# Patient Record
Sex: Male | Born: 1940 | Race: White | Hispanic: No | Marital: Married | Smoking: Former smoker
Health system: Southern US, Community
[De-identification: ages and names within clinical notes are randomized; demographics above are authoritative.]

## PROBLEM LIST (undated history)

## (undated) DIAGNOSIS — I10 Essential (primary) hypertension: Secondary | ICD-10-CM

## (undated) DIAGNOSIS — Z9289 Personal history of other medical treatment: Secondary | ICD-10-CM

## (undated) DIAGNOSIS — I480 Paroxysmal atrial fibrillation: Secondary | ICD-10-CM

## (undated) DIAGNOSIS — N183 Chronic kidney disease, stage 3 unspecified: Secondary | ICD-10-CM

## (undated) DIAGNOSIS — K219 Gastro-esophageal reflux disease without esophagitis: Secondary | ICD-10-CM

## (undated) DIAGNOSIS — M1990 Unspecified osteoarthritis, unspecified site: Secondary | ICD-10-CM

## (undated) DIAGNOSIS — E039 Hypothyroidism, unspecified: Secondary | ICD-10-CM

## (undated) DIAGNOSIS — I491 Atrial premature depolarization: Secondary | ICD-10-CM

## (undated) DIAGNOSIS — E785 Hyperlipidemia, unspecified: Secondary | ICD-10-CM

## (undated) DIAGNOSIS — I319 Disease of pericardium, unspecified: Secondary | ICD-10-CM

## (undated) DIAGNOSIS — H3530 Unspecified macular degeneration: Secondary | ICD-10-CM

## (undated) DIAGNOSIS — J45909 Unspecified asthma, uncomplicated: Secondary | ICD-10-CM

## (undated) DIAGNOSIS — I341 Nonrheumatic mitral (valve) prolapse: Secondary | ICD-10-CM

## (undated) DIAGNOSIS — I4729 Other ventricular tachycardia: Secondary | ICD-10-CM

## (undated) DIAGNOSIS — N400 Enlarged prostate without lower urinary tract symptoms: Secondary | ICD-10-CM

## (undated) DIAGNOSIS — C801 Malignant (primary) neoplasm, unspecified: Secondary | ICD-10-CM

## (undated) DIAGNOSIS — R001 Bradycardia, unspecified: Secondary | ICD-10-CM

## (undated) DIAGNOSIS — E278 Other specified disorders of adrenal gland: Secondary | ICD-10-CM

## (undated) DIAGNOSIS — I472 Ventricular tachycardia: Secondary | ICD-10-CM

## (undated) HISTORY — PX: APPENDECTOMY: SHX54

## (undated) HISTORY — PX: TONSILLECTOMY: SUR1361

## (undated) HISTORY — DX: Unspecified macular degeneration: H35.30

## (undated) HISTORY — DX: Ventricular tachycardia: I47.2

## (undated) HISTORY — DX: Other specified disorders of adrenal gland: E27.8

## (undated) HISTORY — DX: Bradycardia, unspecified: R00.1

## (undated) HISTORY — PX: EYE SURGERY: SHX253

## (undated) HISTORY — DX: Nonrheumatic mitral (valve) prolapse: I34.1

## (undated) HISTORY — DX: Hyperlipidemia, unspecified: E78.5

## (undated) HISTORY — PX: HERNIA REPAIR: SHX51

## (undated) HISTORY — DX: Paroxysmal atrial fibrillation: I48.0

## (undated) HISTORY — DX: Other ventricular tachycardia: I47.29

## (undated) HISTORY — DX: Atrial premature depolarization: I49.1

## (undated) HISTORY — DX: Personal history of other medical treatment: Z92.89

## (undated) HISTORY — DX: Benign prostatic hyperplasia without lower urinary tract symptoms: N40.0

## (undated) HISTORY — DX: Disease of pericardium, unspecified: I31.9

## (undated) HISTORY — DX: Chronic kidney disease, stage 3 unspecified: N18.30

## (undated) HISTORY — DX: Unspecified asthma, uncomplicated: J45.909

## (undated) HISTORY — DX: Hypothyroidism, unspecified: E03.9

## (undated) HISTORY — DX: Chronic kidney disease, stage 3 (moderate): N18.3

---

## 1999-01-04 ENCOUNTER — Encounter: Payer: Self-pay | Admitting: Cardiovascular Disease

## 1999-01-04 ENCOUNTER — Ambulatory Visit (HOSPITAL_COMMUNITY): Admission: RE | Admit: 1999-01-04 | Discharge: 1999-01-04 | Payer: Self-pay | Admitting: Cardiovascular Disease

## 1999-01-04 HISTORY — PX: CARDIAC CATHETERIZATION: SHX172

## 2000-06-23 ENCOUNTER — Ambulatory Visit (HOSPITAL_COMMUNITY): Admission: RE | Admit: 2000-06-23 | Discharge: 2000-06-23 | Payer: Self-pay | Admitting: Urology

## 2000-06-23 ENCOUNTER — Encounter: Payer: Self-pay | Admitting: Urology

## 2001-03-18 ENCOUNTER — Encounter: Payer: Self-pay | Admitting: *Deleted

## 2001-03-18 ENCOUNTER — Inpatient Hospital Stay (HOSPITAL_COMMUNITY): Admission: EM | Admit: 2001-03-18 | Discharge: 2001-03-19 | Payer: Self-pay | Admitting: *Deleted

## 2001-07-14 ENCOUNTER — Encounter: Payer: Self-pay | Admitting: Internal Medicine

## 2001-07-14 ENCOUNTER — Encounter: Admission: RE | Admit: 2001-07-14 | Discharge: 2001-07-14 | Payer: Self-pay | Admitting: Internal Medicine

## 2001-11-25 ENCOUNTER — Encounter: Payer: Self-pay | Admitting: Urology

## 2001-11-25 ENCOUNTER — Ambulatory Visit (HOSPITAL_COMMUNITY): Admission: RE | Admit: 2001-11-25 | Discharge: 2001-11-25 | Payer: Self-pay | Admitting: Urology

## 2003-04-15 ENCOUNTER — Ambulatory Visit (HOSPITAL_COMMUNITY): Admission: RE | Admit: 2003-04-15 | Discharge: 2003-04-15 | Payer: Self-pay | Admitting: *Deleted

## 2005-12-06 ENCOUNTER — Emergency Department (HOSPITAL_COMMUNITY): Admission: EM | Admit: 2005-12-06 | Discharge: 2005-12-06 | Payer: Self-pay | Admitting: Emergency Medicine

## 2006-03-21 ENCOUNTER — Ambulatory Visit (HOSPITAL_COMMUNITY): Admission: RE | Admit: 2006-03-21 | Discharge: 2006-03-21 | Payer: Self-pay | Admitting: *Deleted

## 2008-11-09 ENCOUNTER — Ambulatory Visit (HOSPITAL_COMMUNITY): Admission: RE | Admit: 2008-11-09 | Discharge: 2008-11-09 | Payer: Self-pay | Admitting: *Deleted

## 2008-12-20 HISTORY — PX: OTHER SURGICAL HISTORY: SHX169

## 2010-10-16 NOTE — Op Note (Signed)
NAME:  Tanner Ho, Tanner Ho NO.:  [id number]   MEDICAL RECORD NO.:  [id number]          PATIENT TYPE:  AMB   LOCATION:  ENDO                         FACILITY:  [facility]   PHYSICIAN:  Georgiana Spinner, M.D.    DATE OF BIRTH:  [birth year]   DATE OF PROCEDURE:  11/09/2008  DATE OF DISCHARGE:                               OPERATIVE REPORT   PROCEDURE:  Colonoscopy.   INDICATIONS FOR PROCEDURE:  Constipation, rectal bleeding.   ANESTHESIA:  Fentanyl 50 mcg, Versed 7.5 mg.   PROCEDURE IN DETAIL:  With the patient mildly sedated in the left  lateral decubitus position the Pentax videoscopic pediatric colonoscope  was inserted in the rectum and passed under direct vision to the cecum,  identified by ileocecal valve and appendiceal orifice both of which were  photographed.  From this point the endoscope was slowly withdrawn taking  circumferential views of colonic mucosa stopping only in the rectum  which appeared normal on direct and showed hemorrhoids on retroflexed  view.  The endoscope was straightened and withdrawn.  The patient's  vital signs, pulse oximeter remained stable.  The patient tolerated the  procedure well without apparent complications.   FINDINGS:  Internal hemorrhoids, otherwise an unremarkable exam.   PLAN:  Have the patient follow up with me as needed.           ______________________________  Georgiana Spinner, M.D.     GMO/MEDQ  D:  11/09/2008  T:  11/09/2008  Job:  [id number]

## 2010-10-18 ENCOUNTER — Ambulatory Visit
Admission: RE | Admit: 2010-10-18 | Discharge: 2010-10-18 | Disposition: A | Discharge status: Home / Self Care | Payer: Medicare Other | Source: Ambulatory Visit | Attending: Internal Medicine | Admitting: Internal Medicine

## 2010-10-18 ENCOUNTER — Other Ambulatory Visit: Payer: Self-pay | Admitting: Internal Medicine

## 2010-10-18 DIAGNOSIS — S0990XA Unspecified injury of head, initial encounter: Secondary | ICD-10-CM

## 2010-10-19 NOTE — Op Note (Signed)
   NAME:  Tanner Ho, Tanner Ho NO.:  [id number]   MEDICAL RECORD NO.:  [id number]                   PATIENT TYPE:  AMB   LOCATION:  ENDO                                 FACILITY:  [facility]   PHYSICIAN:  Georgiana Spinner, M.D.                 DATE OF BIRTH:  01/03/1941   DATE OF PROCEDURE:  DATE OF DISCHARGE:                                 OPERATIVE REPORT   PROCEDURE:  Upper endoscopy.   INDICATIONS FOR PROCEDURE:  Gastroesophageal reflux disease.   ANESTHESIA:  Demerol 50, Versed 5 mg.   DESCRIPTION OF PROCEDURE:  With the patient mildly sedated in the left  lateral decubitus position, the Olympus videoscopic endoscope was inserted  in the mouth and passed under direct vision through the esophagus which  appeared normal although there was maybe some changes of erythema distally.  We entered into the stomach. The fundus, body, antrum, duodenal bulb and  second portion of the duodenum all appeared normal. From this point, the  endoscope was slowly withdrawn taking circumferential views of the entire  duodenal mucosa until the endoscope was then pulled back in the stomach,  placed in retroflexion to view the stomach from below. The endoscope was  then straightened and withdrawn taking circumferential views of the  remaining gastric and esophageal mucosa. The patient's vital signs and pulse  oximeter remained stable. The patient tolerated the procedure well without  apparent complications.   FINDINGS:  Minimal erythema of distal esophagus otherwise an unremarkable  examination.   PLAN:  Proceed to colonoscopy.                                               Georgiana Spinner, M.D.    GMO/MEDQ  D:  04/15/2003  T:  04/15/2003  Job:  [id number]

## 2010-10-19 NOTE — Op Note (Signed)
   NAME:  Tanner Ho, Tanner Ho NO.:  [id number]   MEDICAL RECORD NO.:  [id number]                   PATIENT TYPE:  AMB   LOCATION:  ENDO                                 FACILITY:  [facility]   PHYSICIAN:  Georgiana Spinner, M.D.                 DATE OF BIRTH:  June 28, 1940   DATE OF PROCEDURE:  DATE OF DISCHARGE:                                 OPERATIVE REPORT   PROCEDURE:  Colonoscopy.   INDICATIONS:  Cancer screening.   ANESTHESIA:  Demerol 10 mg, Versed 1.   DESCRIPTION OF PROCEDURE:  With the patient mildly sedated and in the left  lateral decubitus position, a rectal examination was performed which was  unremarkable.  Subsequently the Olympus videoscopic colonoscope was inserted  in the rectum and passed under direct vision to the cecum, identified by  ileocecal valve and appendiceal orifice, both of which were photographed.  From this point,  the colonoscope was slowly withdrawn, taking  circumferential views of the entire colonic mucosa stopping only in the  rectum which appeared normal on direct and showed hemorrhoids on retroflexed  view.  The endoscope was straightened and withdrawn.  The patient's vital  signs and pulse oximetry remained stable.  The patient tolerated the  procedure well without apparent complications.   FINDINGS:  Internal hemorrhoids; otherwise unremarkable colonoscopic  examination of the cecum.   PLAN:  Consider repeat examination possibly in five years.                                               Georgiana Spinner, M.D.    GMO/MEDQ  D:  04/15/2003  T:  04/15/2003  Job:  [id number]

## 2010-10-19 NOTE — Discharge Summary (Signed)
[place]  Patient:    Tanner Ho, Tanner Ho Visit Number: [id number] MRN: [id number]          Service Type: MED Location: 2000 2040 01 Attending Physician:  Darlin Priestly Dictated by:   Marya Fossa, P.A. Admit Date:  03/17/2001 Discharge Date: 03/19/2001   CC:         Rozanna Boer., M.D.             Janae Bridgeman. Eloise Harman., M.D.                           Discharge Summary  DATE OF BIRTH:  Oct 29, 1940  ADMISSION DIAGNOSES: 1. Atypical chest pain, rule out myocardial infarction. 2. History of normal coronaries. 3. Hypertension. 4. Atrophic left kidney. 5. History of bladder cancer followed by Dr. Aldean Ast.  DISCHARGE DIAGNOSES: 1. Pericarditis, myocardial infarction ruled out with negative enzymes. 2. Lesion on right kidney, needs outpatient followup CT. 3. History of normal coronaries. 4. Hypertension. 5. Atrophic left kidney. 6. History of bladder cancer followed by Dr. Aldean Ast.  HISTORY OF PRESENT ILLNESS:  Tanner Ho is a 70 year old married white male patient of Dr. Lenise Herald with PAF and patent coronaries as of catheterization August 03, 1998 with an EF of 70%.  He does have some bridging of muscle in the LAD territory.  The patient had been in his usual state of health until the date of admission. He had developed left anterior chest pain after dinner.  He took an aspirin around 10 oclock last night without relief.  He also had pain in the back but no nausea, vomiting, or diaphoresis.  No shortness of breath.  He did have pain with deep inspiration.  He presented to the emergency room at 11 oclock and a nitroglycerin patch was placed with plus or minus help.  The patient states pain medicine helped more than anything else (he was given Toradol). The pain improved from an 8/10 to a 2/10 with Toradol.  EKG showed sinus rhythm with nonspecific changes.  Chest CT was negative for PE and DVT.  Labs showed a  white cell of 9.8, hemoglobin 15.3, and platelets 235.  INR 0.9.  BUN 28, creatinine 1.4.  Potassium 4.6.  Initial enzymes negative.  The patient will be admitted for atypical chest pain, rule out MI.  We will check an abdominal ultrasound and LFTs.  If stable, we will discharge to home in the morning on proton pump inhibitor and NSAIDs.  PROCEDURES: 1. Abdominal ultrasound. 2. Chest CT. 3. Lower extremity CT.  CONSULTATIONS:  None.  COMPLICATIONS:  None.  HOSPITAL COURSE:  [name] was admitted to [facility] with diagnosis of atypical chest pain that responded to Toradol.  Ischemic etiology felt low risk based on the patients history of normal coronaries, negative EKG, and normal enzymes.  He was treated with Toradol and Tylenol for pain.  Cardiac enzymes were negative.  As mentioned above, chest CT was negative for PE and lower extremity study was negative for DVT.  He also had an abdominal ultrasound, which was negative for acute gallbladder process.  His left kidney was absent and there was a small lesion of the right kidney of an unspecific etiology.  Radiology recommended followup limited CT.  The patient remained stable overnight.  He states that when he would lay on his left side he had increasing pain and if he would lie flat  on his back the pain would improve.  He still has some mild discomfort with deep breathing, but again feels significantly improved.  On March 19, 2001, the patient was hemodynamically stable with blood pressure mildly elevated at 142/74.  On physical exam, however, he had a two-component friction rub consistent with pericarditis.  The patient will be discharged this morning, as he is stable.  We are going to send him to our office directly from the hospital for a 2-D echocardiogram to evaluate further.  We will see him back in one week and we will plan for close follow-up.  DISCHARGE MEDICATIONS: 1. Isordil mono 60 mg a day. 2.  Atenolol 25 mg. 3. Accupril increased to 40 mg a day until seen back by Dr. Jenne Campus. 4. Cartia XT 60 mg a day. 5. Enteric-coated aspirin 325 a day. 6. Metamucil. 7. Prilosec 20 mg a day. 8. Vioxx 12.5 mg a day for two weeks then as needed.  ACTIVITY:  As tolerated; however, no strenuous activity until pericarditis resolves.  DIET:  Low-fat/low-cholesterol/low-salt diet.  DISCHARGE INSTRUCTIONS:  The patient is to go directly to our office upon leaving the hospital for a 10 oclock appointment to have a 2-D echocardiogram performed.  FOLLOW-UP:  He will see Dr. Jenne Campus back on October 23 at 12:25.  We have asked him to contact his primary care Sharnika Binney, Dr. Higinio Plan, to have follow-up.  We would recommend Dr. Lendell Caprice or Dr. Aldean Ast to follow up the lesion noted on the abdominal ultrasound performed in the hospital. Dictated by:   Marya Fossa, P.A. Attending Physician:  Darlin Priestly DD:  03/19/01 TD:  03/20/01 Job: [id number]

## 2011-06-11 DIAGNOSIS — Z7901 Long term (current) use of anticoagulants: Secondary | ICD-10-CM | POA: Diagnosis not present

## 2011-06-11 DIAGNOSIS — I10 Essential (primary) hypertension: Secondary | ICD-10-CM | POA: Diagnosis not present

## 2011-06-11 DIAGNOSIS — I4891 Unspecified atrial fibrillation: Secondary | ICD-10-CM | POA: Diagnosis not present

## 2011-06-25 DIAGNOSIS — H353190 Nonexudative age-related macular degeneration, unspecified eye, stage unspecified: Secondary | ICD-10-CM | POA: Diagnosis not present

## 2011-06-25 DIAGNOSIS — H43829 Vitreomacular adhesion, unspecified eye: Secondary | ICD-10-CM | POA: Diagnosis not present

## 2011-07-04 DIAGNOSIS — C44211 Basal cell carcinoma of skin of unspecified ear and external auricular canal: Secondary | ICD-10-CM | POA: Diagnosis not present

## 2011-07-16 DIAGNOSIS — Z7901 Long term (current) use of anticoagulants: Secondary | ICD-10-CM | POA: Diagnosis not present

## 2011-07-16 DIAGNOSIS — I4891 Unspecified atrial fibrillation: Secondary | ICD-10-CM | POA: Diagnosis not present

## 2011-07-16 DIAGNOSIS — I10 Essential (primary) hypertension: Secondary | ICD-10-CM | POA: Diagnosis not present

## 2011-07-25 DIAGNOSIS — H259 Unspecified age-related cataract: Secondary | ICD-10-CM | POA: Diagnosis not present

## 2011-07-25 DIAGNOSIS — H3530 Unspecified macular degeneration: Secondary | ICD-10-CM | POA: Diagnosis not present

## 2011-08-12 DIAGNOSIS — I10 Essential (primary) hypertension: Secondary | ICD-10-CM | POA: Diagnosis not present

## 2011-08-12 DIAGNOSIS — I4891 Unspecified atrial fibrillation: Secondary | ICD-10-CM | POA: Diagnosis not present

## 2011-08-12 DIAGNOSIS — Z7901 Long term (current) use of anticoagulants: Secondary | ICD-10-CM | POA: Diagnosis not present

## 2011-08-15 DIAGNOSIS — H02109 Unspecified ectropion of unspecified eye, unspecified eyelid: Secondary | ICD-10-CM | POA: Diagnosis not present

## 2011-08-15 DIAGNOSIS — H01009 Unspecified blepharitis unspecified eye, unspecified eyelid: Secondary | ICD-10-CM | POA: Diagnosis not present

## 2011-08-20 DIAGNOSIS — H3530 Unspecified macular degeneration: Secondary | ICD-10-CM | POA: Diagnosis not present

## 2011-08-20 DIAGNOSIS — H268 Other specified cataract: Secondary | ICD-10-CM | POA: Diagnosis not present

## 2011-08-20 DIAGNOSIS — H2510 Age-related nuclear cataract, unspecified eye: Secondary | ICD-10-CM | POA: Diagnosis not present

## 2011-08-20 DIAGNOSIS — H25019 Cortical age-related cataract, unspecified eye: Secondary | ICD-10-CM | POA: Diagnosis not present

## 2011-09-11 DIAGNOSIS — Z7901 Long term (current) use of anticoagulants: Secondary | ICD-10-CM | POA: Diagnosis not present

## 2011-09-11 DIAGNOSIS — E039 Hypothyroidism, unspecified: Secondary | ICD-10-CM | POA: Diagnosis not present

## 2011-09-11 DIAGNOSIS — I10 Essential (primary) hypertension: Secondary | ICD-10-CM | POA: Diagnosis not present

## 2011-09-11 DIAGNOSIS — I4891 Unspecified atrial fibrillation: Secondary | ICD-10-CM | POA: Diagnosis not present

## 2011-09-12 DIAGNOSIS — Z7901 Long term (current) use of anticoagulants: Secondary | ICD-10-CM | POA: Diagnosis not present

## 2011-09-12 DIAGNOSIS — I10 Essential (primary) hypertension: Secondary | ICD-10-CM | POA: Diagnosis not present

## 2011-09-12 DIAGNOSIS — I4891 Unspecified atrial fibrillation: Secondary | ICD-10-CM | POA: Diagnosis not present

## 2011-09-16 DIAGNOSIS — L570 Actinic keratosis: Secondary | ICD-10-CM | POA: Diagnosis not present

## 2011-09-16 DIAGNOSIS — Z85828 Personal history of other malignant neoplasm of skin: Secondary | ICD-10-CM | POA: Diagnosis not present

## 2011-09-26 DIAGNOSIS — Z7901 Long term (current) use of anticoagulants: Secondary | ICD-10-CM | POA: Diagnosis not present

## 2011-09-26 DIAGNOSIS — I4891 Unspecified atrial fibrillation: Secondary | ICD-10-CM | POA: Diagnosis not present

## 2011-09-26 DIAGNOSIS — I10 Essential (primary) hypertension: Secondary | ICD-10-CM | POA: Diagnosis not present

## 2011-10-01 DIAGNOSIS — I10 Essential (primary) hypertension: Secondary | ICD-10-CM | POA: Diagnosis not present

## 2011-10-01 DIAGNOSIS — M545 Low back pain: Secondary | ICD-10-CM | POA: Diagnosis not present

## 2011-10-02 DIAGNOSIS — H3530 Unspecified macular degeneration: Secondary | ICD-10-CM | POA: Diagnosis not present

## 2011-10-03 DIAGNOSIS — M545 Low back pain: Secondary | ICD-10-CM | POA: Diagnosis not present

## 2011-10-03 DIAGNOSIS — M47817 Spondylosis without myelopathy or radiculopathy, lumbosacral region: Secondary | ICD-10-CM | POA: Diagnosis not present

## 2011-10-11 DIAGNOSIS — J019 Acute sinusitis, unspecified: Secondary | ICD-10-CM | POA: Diagnosis not present

## 2011-10-11 DIAGNOSIS — R062 Wheezing: Secondary | ICD-10-CM | POA: Diagnosis not present

## 2011-10-11 DIAGNOSIS — R05 Cough: Secondary | ICD-10-CM | POA: Diagnosis not present

## 2011-10-11 DIAGNOSIS — R5383 Other fatigue: Secondary | ICD-10-CM | POA: Diagnosis not present

## 2011-10-11 DIAGNOSIS — R5381 Other malaise: Secondary | ICD-10-CM | POA: Diagnosis not present

## 2011-10-21 DIAGNOSIS — Z961 Presence of intraocular lens: Secondary | ICD-10-CM | POA: Diagnosis not present

## 2011-10-31 DIAGNOSIS — I4891 Unspecified atrial fibrillation: Secondary | ICD-10-CM | POA: Diagnosis not present

## 2011-10-31 DIAGNOSIS — I10 Essential (primary) hypertension: Secondary | ICD-10-CM | POA: Diagnosis not present

## 2011-10-31 DIAGNOSIS — Z7901 Long term (current) use of anticoagulants: Secondary | ICD-10-CM | POA: Diagnosis not present

## 2011-11-01 DIAGNOSIS — H43819 Vitreous degeneration, unspecified eye: Secondary | ICD-10-CM | POA: Diagnosis not present

## 2011-11-01 DIAGNOSIS — H3560 Retinal hemorrhage, unspecified eye: Secondary | ICD-10-CM | POA: Diagnosis not present

## 2011-11-04 DIAGNOSIS — H353190 Nonexudative age-related macular degeneration, unspecified eye, stage unspecified: Secondary | ICD-10-CM | POA: Diagnosis not present

## 2011-11-04 DIAGNOSIS — H43829 Vitreomacular adhesion, unspecified eye: Secondary | ICD-10-CM | POA: Diagnosis not present

## 2011-11-04 DIAGNOSIS — H4310 Vitreous hemorrhage, unspecified eye: Secondary | ICD-10-CM | POA: Diagnosis not present

## 2011-11-04 DIAGNOSIS — H33319 Horseshoe tear of retina without detachment, unspecified eye: Secondary | ICD-10-CM | POA: Diagnosis not present

## 2011-11-04 DIAGNOSIS — H35369 Drusen (degenerative) of macula, unspecified eye: Secondary | ICD-10-CM | POA: Diagnosis not present

## 2011-11-04 DIAGNOSIS — H43819 Vitreous degeneration, unspecified eye: Secondary | ICD-10-CM | POA: Diagnosis not present

## 2011-11-26 DIAGNOSIS — I10 Essential (primary) hypertension: Secondary | ICD-10-CM | POA: Diagnosis not present

## 2011-11-26 DIAGNOSIS — Z7901 Long term (current) use of anticoagulants: Secondary | ICD-10-CM | POA: Diagnosis not present

## 2011-11-26 DIAGNOSIS — R42 Dizziness and giddiness: Secondary | ICD-10-CM | POA: Diagnosis not present

## 2011-11-26 DIAGNOSIS — I4891 Unspecified atrial fibrillation: Secondary | ICD-10-CM | POA: Diagnosis not present

## 2011-12-16 DIAGNOSIS — Z0000 Encounter for general adult medical examination without abnormal findings: Secondary | ICD-10-CM | POA: Diagnosis not present

## 2011-12-16 DIAGNOSIS — I10 Essential (primary) hypertension: Secondary | ICD-10-CM | POA: Diagnosis not present

## 2011-12-16 DIAGNOSIS — Z125 Encounter for screening for malignant neoplasm of prostate: Secondary | ICD-10-CM | POA: Diagnosis not present

## 2011-12-16 DIAGNOSIS — E7800 Pure hypercholesterolemia, unspecified: Secondary | ICD-10-CM | POA: Diagnosis not present

## 2011-12-19 DIAGNOSIS — R5381 Other malaise: Secondary | ICD-10-CM | POA: Diagnosis not present

## 2011-12-19 DIAGNOSIS — Z0000 Encounter for general adult medical examination without abnormal findings: Secondary | ICD-10-CM | POA: Diagnosis not present

## 2011-12-19 DIAGNOSIS — I10 Essential (primary) hypertension: Secondary | ICD-10-CM | POA: Diagnosis not present

## 2011-12-19 DIAGNOSIS — Z7901 Long term (current) use of anticoagulants: Secondary | ICD-10-CM | POA: Diagnosis not present

## 2011-12-19 DIAGNOSIS — I4891 Unspecified atrial fibrillation: Secondary | ICD-10-CM | POA: Diagnosis not present

## 2011-12-26 DIAGNOSIS — I10 Essential (primary) hypertension: Secondary | ICD-10-CM | POA: Diagnosis not present

## 2011-12-26 DIAGNOSIS — Z7901 Long term (current) use of anticoagulants: Secondary | ICD-10-CM | POA: Diagnosis not present

## 2011-12-26 DIAGNOSIS — I4891 Unspecified atrial fibrillation: Secondary | ICD-10-CM | POA: Diagnosis not present

## 2012-01-09 DIAGNOSIS — I4891 Unspecified atrial fibrillation: Secondary | ICD-10-CM | POA: Diagnosis not present

## 2012-01-09 DIAGNOSIS — I10 Essential (primary) hypertension: Secondary | ICD-10-CM | POA: Diagnosis not present

## 2012-01-09 DIAGNOSIS — Z7901 Long term (current) use of anticoagulants: Secondary | ICD-10-CM | POA: Diagnosis not present

## 2012-01-14 DIAGNOSIS — I10 Essential (primary) hypertension: Secondary | ICD-10-CM | POA: Diagnosis not present

## 2012-01-16 DIAGNOSIS — I959 Hypotension, unspecified: Secondary | ICD-10-CM | POA: Diagnosis not present

## 2012-01-16 DIAGNOSIS — N289 Disorder of kidney and ureter, unspecified: Secondary | ICD-10-CM | POA: Diagnosis not present

## 2012-01-16 DIAGNOSIS — I10 Essential (primary) hypertension: Secondary | ICD-10-CM | POA: Diagnosis not present

## 2012-02-10 DIAGNOSIS — I4891 Unspecified atrial fibrillation: Secondary | ICD-10-CM | POA: Diagnosis not present

## 2012-02-10 DIAGNOSIS — Z7901 Long term (current) use of anticoagulants: Secondary | ICD-10-CM | POA: Diagnosis not present

## 2012-02-10 DIAGNOSIS — I10 Essential (primary) hypertension: Secondary | ICD-10-CM | POA: Diagnosis not present

## 2012-02-10 DIAGNOSIS — N289 Disorder of kidney and ureter, unspecified: Secondary | ICD-10-CM | POA: Diagnosis not present

## 2012-02-13 DIAGNOSIS — I10 Essential (primary) hypertension: Secondary | ICD-10-CM | POA: Diagnosis not present

## 2012-02-13 DIAGNOSIS — N189 Chronic kidney disease, unspecified: Secondary | ICD-10-CM | POA: Diagnosis not present

## 2012-02-13 DIAGNOSIS — Z23 Encounter for immunization: Secondary | ICD-10-CM | POA: Diagnosis not present

## 2012-02-13 DIAGNOSIS — E7800 Pure hypercholesterolemia, unspecified: Secondary | ICD-10-CM | POA: Diagnosis not present

## 2012-02-24 DIAGNOSIS — Z7901 Long term (current) use of anticoagulants: Secondary | ICD-10-CM | POA: Diagnosis not present

## 2012-02-24 DIAGNOSIS — I4891 Unspecified atrial fibrillation: Secondary | ICD-10-CM | POA: Diagnosis not present

## 2012-02-24 DIAGNOSIS — I10 Essential (primary) hypertension: Secondary | ICD-10-CM | POA: Diagnosis not present

## 2012-03-03 DIAGNOSIS — Z9289 Personal history of other medical treatment: Secondary | ICD-10-CM

## 2012-03-03 DIAGNOSIS — E039 Hypothyroidism, unspecified: Secondary | ICD-10-CM | POA: Diagnosis not present

## 2012-03-03 DIAGNOSIS — E782 Mixed hyperlipidemia: Secondary | ICD-10-CM | POA: Diagnosis not present

## 2012-03-03 DIAGNOSIS — I4891 Unspecified atrial fibrillation: Secondary | ICD-10-CM | POA: Diagnosis not present

## 2012-03-03 DIAGNOSIS — I119 Hypertensive heart disease without heart failure: Secondary | ICD-10-CM | POA: Diagnosis not present

## 2012-03-03 HISTORY — DX: Personal history of other medical treatment: Z92.89

## 2012-03-12 DIAGNOSIS — L821 Other seborrheic keratosis: Secondary | ICD-10-CM | POA: Diagnosis not present

## 2012-03-12 DIAGNOSIS — D485 Neoplasm of uncertain behavior of skin: Secondary | ICD-10-CM | POA: Diagnosis not present

## 2012-03-12 DIAGNOSIS — L570 Actinic keratosis: Secondary | ICD-10-CM | POA: Diagnosis not present

## 2012-03-12 DIAGNOSIS — C44319 Basal cell carcinoma of skin of other parts of face: Secondary | ICD-10-CM | POA: Diagnosis not present

## 2012-03-13 ENCOUNTER — Emergency Department (HOSPITAL_COMMUNITY)
Admission: EM | Admit: 2012-03-13 | Discharge: 2012-03-13 | Disposition: A | Discharge status: Home / Self Care | Payer: Medicare Other | Attending: Emergency Medicine | Admitting: Emergency Medicine

## 2012-03-13 ENCOUNTER — Emergency Department (HOSPITAL_COMMUNITY): Payer: Medicare Other

## 2012-03-13 ENCOUNTER — Encounter (HOSPITAL_COMMUNITY): Payer: Self-pay | Admitting: *Deleted

## 2012-03-13 DIAGNOSIS — Z7901 Long term (current) use of anticoagulants: Secondary | ICD-10-CM | POA: Diagnosis not present

## 2012-03-13 DIAGNOSIS — N183 Chronic kidney disease, stage 3 unspecified: Secondary | ICD-10-CM | POA: Diagnosis present

## 2012-03-13 DIAGNOSIS — IMO0001 Reserved for inherently not codable concepts without codable children: Secondary | ICD-10-CM

## 2012-03-13 DIAGNOSIS — E785 Hyperlipidemia, unspecified: Secondary | ICD-10-CM | POA: Diagnosis present

## 2012-03-13 DIAGNOSIS — R002 Palpitations: Secondary | ICD-10-CM | POA: Diagnosis not present

## 2012-03-13 DIAGNOSIS — I480 Paroxysmal atrial fibrillation: Secondary | ICD-10-CM | POA: Diagnosis present

## 2012-03-13 DIAGNOSIS — I4891 Unspecified atrial fibrillation: Secondary | ICD-10-CM | POA: Insufficient documentation

## 2012-03-13 DIAGNOSIS — R000 Tachycardia, unspecified: Secondary | ICD-10-CM | POA: Diagnosis not present

## 2012-03-13 DIAGNOSIS — C679 Malignant neoplasm of bladder, unspecified: Secondary | ICD-10-CM | POA: Diagnosis present

## 2012-03-13 DIAGNOSIS — I10 Essential (primary) hypertension: Secondary | ICD-10-CM | POA: Diagnosis present

## 2012-03-13 DIAGNOSIS — E039 Hypothyroidism, unspecified: Secondary | ICD-10-CM | POA: Diagnosis present

## 2012-03-13 HISTORY — DX: Essential (primary) hypertension: I10

## 2012-03-13 LAB — CBC
MCH: 35.5 pg — ABNORMAL HIGH (ref 26.0–34.0)
MCV: 99.3 fL (ref 78.0–100.0)
Platelets: 126 10*3/uL — ABNORMAL LOW (ref 150–400)
RDW: 12.7 % (ref 11.5–15.5)

## 2012-03-13 LAB — BASIC METABOLIC PANEL WITH GFR
BUN: 19 mg/dL (ref 6–23)
CO2: 23 meq/L (ref 19–32)
Calcium: 9.3 mg/dL (ref 8.4–10.5)
Chloride: 108 meq/L (ref 96–112)
Creatinine, Ser: 1.29 mg/dL (ref 0.50–1.35)
GFR calc Af Amer: 63 mL/min — ABNORMAL LOW
GFR calc non Af Amer: 54 mL/min — ABNORMAL LOW
Glucose, Bld: 111 mg/dL — ABNORMAL HIGH (ref 70–99)
Potassium: 4.4 meq/L (ref 3.5–5.1)
Sodium: 142 meq/L (ref 135–145)

## 2012-03-13 LAB — PROTIME-INR
INR: 2.13 — ABNORMAL HIGH (ref 0.00–1.49)
Prothrombin Time: 22.9 s — ABNORMAL HIGH (ref 11.6–15.2)

## 2012-03-13 LAB — POCT I-STAT TROPONIN I: Troponin i, poc: 0.05 ng/mL (ref 0.00–0.08)

## 2012-03-13 LAB — TSH: TSH: 3.284 u[IU]/mL (ref 0.350–4.500)

## 2012-03-13 LAB — MAGNESIUM: Magnesium: 2 mg/dL (ref 1.5–2.5)

## 2012-03-13 MED ORDER — DILTIAZEM HCL 25 MG/5ML IV SOLN
10.0000 mg | Freq: Once | INTRAVENOUS | Status: AC
  Administered 2012-03-13: 10 mg via INTRAVENOUS
  Filled 2012-03-13: qty 5

## 2012-03-13 MED ORDER — SODIUM CHLORIDE 0.9 % IV BOLUS (SEPSIS)
1000.0000 mL | Freq: Once | INTRAVENOUS | Status: AC
  Administered 2012-03-13: 1000 mL via INTRAVENOUS

## 2012-03-13 NOTE — H&P (Signed)
Patient ID: EVON TALARICO MRN: [id number], DOB/AGE: 1941-05-06   Admit date: 03/13/2012   Primary Physician: Dr Renne Crigler Primary Cardiologist: Dr Tresa Endo  HPI: 71 y/o male with a history of Nl Cors in March 2000. He also has a history of PAF. He was put on low dose Amiodarone and Coumadin several years ago. He has maintained NSR since till 1am today when he woke up with tachycardia.  He denies any recent illness, no fever, no over the counter medications. In the ER his rate came down with Diltiazem 10mg  IV. His INR is followed at [facility].   Problem List: Past Medical History  Diagnosis Date  . Atrial fibrillation   . Hypertension     No past surgical history on file.   Allergies: No Known Allergies   Home Medications  See Med Rec   No family history on file.   History   Social History  . Marital Status: Married    Spouse Name: N/A    Number of Children: N/A  . Years of Education: N/A   Occupational History  . Not on file.   Social History Main Topics  . Smoking status: Never Smoker   . Smokeless tobacco: Not on file  . Alcohol Use: No  . Drug Use:   . Sexually Active:    Other Topics Concern  . Not on file   Social History Narrative  . No narrative on file     Review of Systems: General: negative for chills, fever, night sweats or weight changes.  Cardiovascular: negative for chest pain, dyspnea on exertion, edema, orthopnea, paroxysmal nocturnal dyspnea or shortness of breath Dermatological: negative for rash Respiratory: negative for cough or wheezing Urologic: negative for hematuria Abdominal: negative for nausea, vomiting, diarrhea, bright red blood per rectum, melena, or hematemesis Neurologic: negative for visual changes, syncope, or dizziness All other systems reviewed and are otherwise negative except as noted above.  Physical Exam: Blood pressure 118/68, pulse 79, temperature 98 F (36.7 C), resp. rate 11, SpO2 95.00%.  General appearance:  alert, cooperative and no distress Neck: no carotid bruit, no JVD and thyroid not enlarged, symmetric, no tenderness/mass/nodules Lungs: clear to auscultation bilaterally Heart: irregularly irregular rhythm Abdomen: soft, non-tender; bowel sounds normal; no masses,  no organomegaly Extremities: extremities normal, atraumatic, no cyanosis or edema Pulses: 2+ and symmetric Skin: Skin color, texture, turgor normal. No rashes or lesions Neurologic: Grossly normal    Labs:   Results for orders placed during the hospital encounter of 03/13/12 (from the past 24 hour(s))  CBC     Status: Abnormal   Collection Time   03/13/12  2:13 AM      Component Value Range   WBC 4.8  4.0 - 10.5 K/uL   RBC 4.31  4.22 - 5.81 MIL/uL   Hemoglobin 15.3  13.0 - 17.0 g/dL   HCT 11.9  14.7 - 82.9 %   MCV 99.3  78.0 - 100.0 fL   MCH 35.5 (*) 26.0 - 34.0 pg   MCHC 35.7  30.0 - 36.0 g/dL   RDW 56.2  13.0 - 86.5 %   Platelets 126 (*) 150 - 400 K/uL  BASIC METABOLIC PANEL     Status: Abnormal   Collection Time   03/13/12  2:13 AM      Component Value Range   Sodium 142  135 - 145 mEq/L   Potassium 4.4  3.5 - 5.1 mEq/L   Chloride 108  96 - 112 mEq/L  CO2 23  19 - 32 mEq/L   Glucose, Bld 111 (*) 70 - 99 mg/dL   BUN 19  6 - 23 mg/dL   Creatinine, Ser 0.98  0.50 - 1.35 mg/dL   Calcium 9.3  8.4 - 11.9 mg/dL   GFR calc non Af Amer 54 (*) >90 mL/min   GFR calc Af Amer 63 (*) >90 mL/min  MAGNESIUM     Status: Normal   Collection Time   03/13/12  2:13 AM      Component Value Range   Magnesium 2.0  1.5 - 2.5 mg/dL  PROTIME-INR     Status: Abnormal   Collection Time   03/13/12  2:13 AM      Component Value Range   Prothrombin Time 22.9 (*) 11.6 - 15.2 seconds   INR 2.13 (*) 0.00 - 1.49  POCT I-STAT TROPONIN I     Status: Normal   Collection Time   03/13/12  5:23 AM      Component Value Range   Troponin i, poc 0.05  0.00 - 0.08 ng/mL   Comment 3              Radiology/Studies: Dg Chest 2  View  03/13/2012  *RADIOLOGY REPORT*  Clinical Data: 71 year old male with palpitations.  CHEST - 2 VIEW  Comparison: None.  Findings: Cardiac size at the upper limits of normal. Other mediastinal contours are within normal limits.  Visualized tracheal air column is within normal limits.  Large lung volumes.  No pneumothorax or pulmonary edema.  No pleural effusion or consolidation. Scattered pulmonary scarring suspected.  No acute osseous abnormality identified.  IMPRESSION: No acute cardiopulmonary abnormality.   Original Report Authenticated By: Harley Hallmark, M.D.     EKG:AF with LVH by voltage  ASSESSMENT AND PLAN:  Active Problems:  PAF (paroxysmal atrial fibrillation), recurrent 03/12/12  Chronic anticoagulation  Chronic renal disease, stage III, baseline SCr 1.5  HTN (hypertension)  Dyslipidemia  Hypothyroid  Bladder cancer  Normal coronary arteries March 2000  Plan- Dr Royann Shivers to see. Check TSH.  [name], PA-C 03/13/2012, 8:18 AM   I have seen and examined the patient along with Corine Shelter, PA-C.  I have reviewed the chart, notes and new data.  I agree with PA's note.  Key new complaints: Mild dyspnea on exertion; palpitations at rest Key examination changes: remains in AF, ventricular rate 70-80s Key new findings / data: INR 2.1 today, consistently 2.4 or greater since May Mercy Riding, PharmD at [facility])  PLAN: Offered early cardioversion today, but he is reluctant and prefers to wait over the weekend to see if arrhythmia resolves spontaneously, as it has done in the past. Will increase Amiodarone dose to 200 mg a day and schedule early follow-up and INR recheck next week. Avoid excessive physical exertion over the weekend. Discussed risks and benefits of elective synchronized DC CV in detail, in case this is necessary next week.  Thurmon Fair, MD, [facility] [phone] 03/13/2012, 8:44 AM

## 2012-03-13 NOTE — ED Provider Notes (Signed)
History     CSN: [id number]  Arrival date & time 03/13/12  0149   First MD Initiated Contact with Patient 03/13/12 0203      Chief Complaint  Patient presents with  . Irregular Heart Beat    (Consider location/radiation/quality/duration/timing/severity/associated sxs/prior treatment) HPI Comments: [name] presents with his wife for evaluation of palpitations.  He states he felt well when he went to bed at 22;30 but awoke with a fluttering in his chest.  He has had similar episodes in the past related to atrial fibrillation.  He denies missing any doses of his medications and also denies any recent illnesses.  He denies chest pain, fever, or shortness of breath.  The history is provided by the patient. No language interpreter was used.    Past Medical History  Diagnosis Date  . Atrial fibrillation   . Hypertension     No past surgical history on file.  No family history on file.  History  Substance Use Topics  . Smoking status: Never Smoker   . Smokeless tobacco: Not on file  . Alcohol Use: No      Review of Systems  Constitutional: Negative for fever, diaphoresis, activity change, appetite change and fatigue.  HENT: Negative.   Eyes: Negative.   Respiratory: Negative for cough, chest tightness and shortness of breath.   Cardiovascular: Positive for palpitations. Negative for chest pain and leg swelling.  Gastrointestinal: Negative.   Genitourinary: Negative.   Musculoskeletal: Negative.   Skin: Negative.   Neurological: Negative.   Hematological: Negative.   Psychiatric/Behavioral: Negative.     Allergies  Review of patient's allergies indicates no known allergies.  Home Medications  No current outpatient prescriptions on file.  BP 199/87  Pulse 129  Temp 98 F (36.7 C)  Resp 16  SpO2 97%  Physical Exam  Nursing note and vitals reviewed. Constitutional: He is oriented to person, place, and time. He appears well-developed and well-nourished. No  distress.  HENT:  Head: Normocephalic and atraumatic.  Right Ear: External ear normal.  Left Ear: External ear normal.  Nose: Nose normal.  Mouth/Throat: Oropharynx is clear and moist. No oropharyngeal exudate.  Eyes: Conjunctivae normal are normal. Pupils are equal, round, and reactive to light. Right eye exhibits no discharge. Left eye exhibits no discharge. No scleral icterus.  Neck: Normal range of motion. Neck supple. No JVD present. No tracheal deviation present. No thyromegaly present.  Cardiovascular: S1 normal, S2 normal, normal heart sounds, intact distal pulses and normal pulses.  An irregularly irregular rhythm present. Tachycardia present.  PMI is not displaced.  Exam reveals no gallop and no decreased pulses.   No murmur heard. Pulmonary/Chest: Effort normal and breath sounds normal. No stridor. No respiratory distress. He has no wheezes. He has no rales. He exhibits no tenderness.  Abdominal: Soft. Bowel sounds are normal. He exhibits no distension and no mass. There is no tenderness. There is no rebound and no guarding.  Musculoskeletal: Normal range of motion. He exhibits no edema and no tenderness.  Lymphadenopathy:    He has no cervical adenopathy.  Neurological: He is alert and oriented to person, place, and time. No cranial nerve deficit.  Skin: Skin is warm and dry. No rash noted. He is not diaphoretic. No erythema. No pallor.  Psychiatric: He has a normal mood and affect. His behavior is normal.    ED Course  Procedures (including critical care time)   Labs Reviewed  CBC  BASIC METABOLIC PANEL  MAGNESIUM  PROTIME-INR   No results found.   No diagnosis found.   Date: 03/13/2012  Rate: 129 bpm  Rhythm: atrial fibrillation  QRS Axis: left  Intervals: normal (absent PR int)  ST/T Wave abnormalities: nonspecific ST changes  Conduction Disutrbances:none  Narrative Interpretation:  afib + RVR, note diffuse ST changes possibly rate related  Old EKG Reviewed:  unchanged except for rate      MDM  Pt presents with palpitations and an elevated HR.  He hs an exam and EKG consistent with afib + RVR.  He has a known hx of the same.  He is currently pain free, NAD.  Plan basic labs, IVF, coags, CXR.  Will administer cardizem for rate control and reassess.  1610.  Pt is pain free.  Rate is improved (75-85 bpm).  Plan 3 hr trop.  If negative, will contact his cardiologist for further recommendations regarding his medications.  Anticipate discharge home.  0700.  Rate still improved.  Pt is stable, NAD.  Repeat trop is negative.  Consult placed with his cardiologist's group.  Awaiting call-back.  0745.  Pt stable, NAD.  Plan discharge home.  Pt will be seen in the ER by the on-call provider for [facility] for further evaluation.      Tobin Chad, MD 03/13/12 [phone]

## 2012-03-13 NOTE — ED Notes (Signed)
Patient states that he went to bed tonight and woke up around 1am with irregular heart beat.  Patient denies any chest pain. States that he felt his hear was fluttering.  Patient feels weaker than normal.

## 2012-03-16 DIAGNOSIS — Z7901 Long term (current) use of anticoagulants: Secondary | ICD-10-CM | POA: Diagnosis not present

## 2012-03-16 DIAGNOSIS — I10 Essential (primary) hypertension: Secondary | ICD-10-CM | POA: Diagnosis not present

## 2012-03-16 DIAGNOSIS — I4891 Unspecified atrial fibrillation: Secondary | ICD-10-CM | POA: Diagnosis not present

## 2012-03-18 DIAGNOSIS — E782 Mixed hyperlipidemia: Secondary | ICD-10-CM | POA: Diagnosis not present

## 2012-03-18 DIAGNOSIS — Z7901 Long term (current) use of anticoagulants: Secondary | ICD-10-CM | POA: Diagnosis not present

## 2012-03-18 DIAGNOSIS — I4891 Unspecified atrial fibrillation: Secondary | ICD-10-CM | POA: Diagnosis not present

## 2012-03-26 DIAGNOSIS — I10 Essential (primary) hypertension: Secondary | ICD-10-CM | POA: Diagnosis not present

## 2012-03-26 DIAGNOSIS — I4891 Unspecified atrial fibrillation: Secondary | ICD-10-CM | POA: Diagnosis not present

## 2012-03-26 DIAGNOSIS — Z7901 Long term (current) use of anticoagulants: Secondary | ICD-10-CM | POA: Diagnosis not present

## 2012-04-01 DIAGNOSIS — R0602 Shortness of breath: Secondary | ICD-10-CM | POA: Diagnosis not present

## 2012-04-01 DIAGNOSIS — I2510 Atherosclerotic heart disease of native coronary artery without angina pectoris: Secondary | ICD-10-CM | POA: Diagnosis not present

## 2012-04-01 DIAGNOSIS — R079 Chest pain, unspecified: Secondary | ICD-10-CM | POA: Diagnosis not present

## 2012-04-01 DIAGNOSIS — I10 Essential (primary) hypertension: Secondary | ICD-10-CM | POA: Diagnosis not present

## 2012-04-15 DIAGNOSIS — N401 Enlarged prostate with lower urinary tract symptoms: Secondary | ICD-10-CM | POA: Diagnosis not present

## 2012-04-24 DIAGNOSIS — C44319 Basal cell carcinoma of skin of other parts of face: Secondary | ICD-10-CM | POA: Diagnosis not present

## 2012-04-27 DIAGNOSIS — I4891 Unspecified atrial fibrillation: Secondary | ICD-10-CM | POA: Diagnosis not present

## 2012-04-27 DIAGNOSIS — I10 Essential (primary) hypertension: Secondary | ICD-10-CM | POA: Diagnosis not present

## 2012-04-27 DIAGNOSIS — Z7901 Long term (current) use of anticoagulants: Secondary | ICD-10-CM | POA: Diagnosis not present

## 2012-05-12 DIAGNOSIS — D485 Neoplasm of uncertain behavior of skin: Secondary | ICD-10-CM | POA: Diagnosis not present

## 2012-05-12 DIAGNOSIS — Z85828 Personal history of other malignant neoplasm of skin: Secondary | ICD-10-CM | POA: Diagnosis not present

## 2012-05-12 DIAGNOSIS — L821 Other seborrheic keratosis: Secondary | ICD-10-CM | POA: Diagnosis not present

## 2012-05-12 DIAGNOSIS — D2370 Other benign neoplasm of skin of unspecified lower limb, including hip: Secondary | ICD-10-CM | POA: Diagnosis not present

## 2012-05-12 DIAGNOSIS — D239 Other benign neoplasm of skin, unspecified: Secondary | ICD-10-CM | POA: Diagnosis not present

## 2012-05-12 DIAGNOSIS — L570 Actinic keratosis: Secondary | ICD-10-CM | POA: Diagnosis not present

## 2012-05-12 DIAGNOSIS — Z79899 Other long term (current) drug therapy: Secondary | ICD-10-CM | POA: Diagnosis not present

## 2012-05-12 DIAGNOSIS — R5381 Other malaise: Secondary | ICD-10-CM | POA: Diagnosis not present

## 2012-05-19 ENCOUNTER — Other Ambulatory Visit (HOSPITAL_COMMUNITY): Payer: Self-pay | Admitting: Cardiovascular Disease

## 2012-05-19 DIAGNOSIS — I10 Essential (primary) hypertension: Secondary | ICD-10-CM | POA: Diagnosis not present

## 2012-05-19 DIAGNOSIS — R0989 Other specified symptoms and signs involving the circulatory and respiratory systems: Secondary | ICD-10-CM

## 2012-05-19 DIAGNOSIS — I119 Hypertensive heart disease without heart failure: Secondary | ICD-10-CM

## 2012-05-19 DIAGNOSIS — R0609 Other forms of dyspnea: Secondary | ICD-10-CM | POA: Diagnosis not present

## 2012-05-19 DIAGNOSIS — I4891 Unspecified atrial fibrillation: Secondary | ICD-10-CM | POA: Diagnosis not present

## 2012-05-19 DIAGNOSIS — L0889 Other specified local infections of the skin and subcutaneous tissue: Secondary | ICD-10-CM | POA: Diagnosis not present

## 2012-05-21 DIAGNOSIS — I4891 Unspecified atrial fibrillation: Secondary | ICD-10-CM | POA: Diagnosis not present

## 2012-05-21 DIAGNOSIS — Z7901 Long term (current) use of anticoagulants: Secondary | ICD-10-CM | POA: Diagnosis not present

## 2012-05-21 DIAGNOSIS — I10 Essential (primary) hypertension: Secondary | ICD-10-CM | POA: Diagnosis not present

## 2012-05-25 ENCOUNTER — Ambulatory Visit (HOSPITAL_COMMUNITY): Payer: Medicare Other

## 2012-05-25 DIAGNOSIS — I10 Essential (primary) hypertension: Secondary | ICD-10-CM | POA: Diagnosis not present

## 2012-05-25 DIAGNOSIS — Z7901 Long term (current) use of anticoagulants: Secondary | ICD-10-CM | POA: Diagnosis not present

## 2012-05-25 DIAGNOSIS — I4891 Unspecified atrial fibrillation: Secondary | ICD-10-CM | POA: Diagnosis not present

## 2012-06-08 DIAGNOSIS — I10 Essential (primary) hypertension: Secondary | ICD-10-CM | POA: Diagnosis not present

## 2012-06-08 DIAGNOSIS — H43819 Vitreous degeneration, unspecified eye: Secondary | ICD-10-CM | POA: Diagnosis not present

## 2012-06-08 DIAGNOSIS — H3530 Unspecified macular degeneration: Secondary | ICD-10-CM | POA: Diagnosis not present

## 2012-06-08 DIAGNOSIS — Z7901 Long term (current) use of anticoagulants: Secondary | ICD-10-CM | POA: Diagnosis not present

## 2012-06-08 DIAGNOSIS — Z961 Presence of intraocular lens: Secondary | ICD-10-CM | POA: Diagnosis not present

## 2012-06-08 DIAGNOSIS — H01009 Unspecified blepharitis unspecified eye, unspecified eyelid: Secondary | ICD-10-CM | POA: Diagnosis not present

## 2012-06-08 DIAGNOSIS — I4891 Unspecified atrial fibrillation: Secondary | ICD-10-CM | POA: Diagnosis not present

## 2012-06-09 DIAGNOSIS — I119 Hypertensive heart disease without heart failure: Secondary | ICD-10-CM | POA: Diagnosis not present

## 2012-06-10 DIAGNOSIS — I059 Rheumatic mitral valve disease, unspecified: Secondary | ICD-10-CM | POA: Diagnosis not present

## 2012-06-10 DIAGNOSIS — I4891 Unspecified atrial fibrillation: Secondary | ICD-10-CM | POA: Diagnosis not present

## 2012-06-10 DIAGNOSIS — I119 Hypertensive heart disease without heart failure: Secondary | ICD-10-CM | POA: Diagnosis not present

## 2012-06-10 DIAGNOSIS — R609 Edema, unspecified: Secondary | ICD-10-CM | POA: Diagnosis not present

## 2012-06-15 ENCOUNTER — Ambulatory Visit (HOSPITAL_COMMUNITY)
Admission: RE | Admit: 2012-06-15 | Discharge: 2012-06-15 | Disposition: A | Discharge status: Home / Self Care | Payer: Medicare Other | Source: Ambulatory Visit | Attending: Cardiovascular Disease | Admitting: Cardiovascular Disease

## 2012-06-15 DIAGNOSIS — R0989 Other specified symptoms and signs involving the circulatory and respiratory systems: Secondary | ICD-10-CM | POA: Diagnosis not present

## 2012-06-15 DIAGNOSIS — R0609 Other forms of dyspnea: Secondary | ICD-10-CM | POA: Diagnosis not present

## 2012-06-15 DIAGNOSIS — R0602 Shortness of breath: Secondary | ICD-10-CM | POA: Insufficient documentation

## 2012-06-15 DIAGNOSIS — I369 Nonrheumatic tricuspid valve disorder, unspecified: Secondary | ICD-10-CM | POA: Diagnosis not present

## 2012-06-15 DIAGNOSIS — I059 Rheumatic mitral valve disease, unspecified: Secondary | ICD-10-CM | POA: Diagnosis not present

## 2012-06-15 DIAGNOSIS — I119 Hypertensive heart disease without heart failure: Secondary | ICD-10-CM

## 2012-06-15 DIAGNOSIS — I359 Nonrheumatic aortic valve disorder, unspecified: Secondary | ICD-10-CM | POA: Insufficient documentation

## 2012-06-15 NOTE — Progress Notes (Signed)
2D Echo Performed 06/15/2012    Darran Gabay, RCS  

## 2012-06-22 DIAGNOSIS — Z7901 Long term (current) use of anticoagulants: Secondary | ICD-10-CM | POA: Diagnosis not present

## 2012-06-22 DIAGNOSIS — I4891 Unspecified atrial fibrillation: Secondary | ICD-10-CM | POA: Diagnosis not present

## 2012-06-22 DIAGNOSIS — I10 Essential (primary) hypertension: Secondary | ICD-10-CM | POA: Diagnosis not present

## 2012-06-23 DIAGNOSIS — B379 Candidiasis, unspecified: Secondary | ICD-10-CM | POA: Diagnosis not present

## 2012-06-23 DIAGNOSIS — Z7901 Long term (current) use of anticoagulants: Secondary | ICD-10-CM | POA: Diagnosis not present

## 2012-06-23 DIAGNOSIS — J209 Acute bronchitis, unspecified: Secondary | ICD-10-CM | POA: Diagnosis not present

## 2012-06-23 DIAGNOSIS — L02419 Cutaneous abscess of limb, unspecified: Secondary | ICD-10-CM | POA: Diagnosis not present

## 2012-06-23 DIAGNOSIS — L03119 Cellulitis of unspecified part of limb: Secondary | ICD-10-CM | POA: Diagnosis not present

## 2012-06-25 DIAGNOSIS — I4891 Unspecified atrial fibrillation: Secondary | ICD-10-CM | POA: Diagnosis not present

## 2012-06-25 DIAGNOSIS — I10 Essential (primary) hypertension: Secondary | ICD-10-CM | POA: Diagnosis not present

## 2012-06-25 DIAGNOSIS — Z7901 Long term (current) use of anticoagulants: Secondary | ICD-10-CM | POA: Diagnosis not present

## 2012-06-29 DIAGNOSIS — T148XXA Other injury of unspecified body region, initial encounter: Secondary | ICD-10-CM | POA: Diagnosis not present

## 2012-07-03 ENCOUNTER — Other Ambulatory Visit (HOSPITAL_COMMUNITY): Payer: Self-pay | Admitting: Internal Medicine

## 2012-07-03 ENCOUNTER — Ambulatory Visit (HOSPITAL_COMMUNITY)
Admission: RE | Admit: 2012-07-03 | Discharge: 2012-07-03 | Disposition: A | Discharge status: Home / Self Care | Payer: Medicare Other | Source: Ambulatory Visit | Attending: Internal Medicine | Admitting: Internal Medicine

## 2012-07-03 DIAGNOSIS — Z792 Long term (current) use of antibiotics: Secondary | ICD-10-CM

## 2012-07-03 DIAGNOSIS — Z5181 Encounter for therapeutic drug level monitoring: Secondary | ICD-10-CM | POA: Diagnosis not present

## 2012-07-03 DIAGNOSIS — L02419 Cutaneous abscess of limb, unspecified: Secondary | ICD-10-CM | POA: Diagnosis not present

## 2012-07-03 DIAGNOSIS — B965 Pseudomonas (aeruginosa) (mallei) (pseudomallei) as the cause of diseases classified elsewhere: Secondary | ICD-10-CM | POA: Diagnosis not present

## 2012-07-03 DIAGNOSIS — L089 Local infection of the skin and subcutaneous tissue, unspecified: Secondary | ICD-10-CM | POA: Insufficient documentation

## 2012-07-03 DIAGNOSIS — Z452 Encounter for adjustment and management of vascular access device: Secondary | ICD-10-CM | POA: Diagnosis not present

## 2012-07-03 DIAGNOSIS — L97209 Non-pressure chronic ulcer of unspecified calf with unspecified severity: Secondary | ICD-10-CM | POA: Diagnosis not present

## 2012-07-03 DIAGNOSIS — L03119 Cellulitis of unspecified part of limb: Secondary | ICD-10-CM | POA: Diagnosis not present

## 2012-07-06 DIAGNOSIS — L97209 Non-pressure chronic ulcer of unspecified calf with unspecified severity: Secondary | ICD-10-CM | POA: Diagnosis not present

## 2012-07-06 DIAGNOSIS — Z452 Encounter for adjustment and management of vascular access device: Secondary | ICD-10-CM | POA: Diagnosis not present

## 2012-07-06 DIAGNOSIS — Z7901 Long term (current) use of anticoagulants: Secondary | ICD-10-CM | POA: Diagnosis not present

## 2012-07-06 DIAGNOSIS — L03119 Cellulitis of unspecified part of limb: Secondary | ICD-10-CM | POA: Diagnosis not present

## 2012-07-06 DIAGNOSIS — L02419 Cutaneous abscess of limb, unspecified: Secondary | ICD-10-CM | POA: Diagnosis not present

## 2012-07-06 DIAGNOSIS — I4891 Unspecified atrial fibrillation: Secondary | ICD-10-CM | POA: Diagnosis not present

## 2012-07-06 DIAGNOSIS — L98499 Non-pressure chronic ulcer of skin of other sites with unspecified severity: Secondary | ICD-10-CM | POA: Diagnosis not present

## 2012-07-06 DIAGNOSIS — I10 Essential (primary) hypertension: Secondary | ICD-10-CM | POA: Diagnosis not present

## 2012-07-06 DIAGNOSIS — Z5181 Encounter for therapeutic drug level monitoring: Secondary | ICD-10-CM | POA: Diagnosis not present

## 2012-07-08 DIAGNOSIS — H353190 Nonexudative age-related macular degeneration, unspecified eye, stage unspecified: Secondary | ICD-10-CM | POA: Diagnosis not present

## 2012-07-08 DIAGNOSIS — H33309 Unspecified retinal break, unspecified eye: Secondary | ICD-10-CM | POA: Diagnosis not present

## 2012-07-10 DIAGNOSIS — Z452 Encounter for adjustment and management of vascular access device: Secondary | ICD-10-CM | POA: Diagnosis not present

## 2012-07-10 DIAGNOSIS — Z5181 Encounter for therapeutic drug level monitoring: Secondary | ICD-10-CM | POA: Diagnosis not present

## 2012-07-10 DIAGNOSIS — L02419 Cutaneous abscess of limb, unspecified: Secondary | ICD-10-CM | POA: Diagnosis not present

## 2012-07-10 DIAGNOSIS — L97209 Non-pressure chronic ulcer of unspecified calf with unspecified severity: Secondary | ICD-10-CM | POA: Diagnosis not present

## 2012-07-13 DIAGNOSIS — L97209 Non-pressure chronic ulcer of unspecified calf with unspecified severity: Secondary | ICD-10-CM | POA: Diagnosis not present

## 2012-07-13 DIAGNOSIS — L02419 Cutaneous abscess of limb, unspecified: Secondary | ICD-10-CM | POA: Diagnosis not present

## 2012-07-13 DIAGNOSIS — I10 Essential (primary) hypertension: Secondary | ICD-10-CM | POA: Diagnosis not present

## 2012-07-13 DIAGNOSIS — I4891 Unspecified atrial fibrillation: Secondary | ICD-10-CM | POA: Diagnosis not present

## 2012-07-13 DIAGNOSIS — Z7901 Long term (current) use of anticoagulants: Secondary | ICD-10-CM | POA: Diagnosis not present

## 2012-07-13 DIAGNOSIS — Z452 Encounter for adjustment and management of vascular access device: Secondary | ICD-10-CM | POA: Diagnosis not present

## 2012-07-13 DIAGNOSIS — Z5181 Encounter for therapeutic drug level monitoring: Secondary | ICD-10-CM | POA: Diagnosis not present

## 2012-07-20 DIAGNOSIS — R0602 Shortness of breath: Secondary | ICD-10-CM | POA: Diagnosis not present

## 2012-07-20 HISTORY — PX: OTHER SURGICAL HISTORY: SHX169

## 2012-07-22 DIAGNOSIS — R0609 Other forms of dyspnea: Secondary | ICD-10-CM | POA: Diagnosis not present

## 2012-07-22 DIAGNOSIS — I4891 Unspecified atrial fibrillation: Secondary | ICD-10-CM | POA: Diagnosis not present

## 2012-07-22 DIAGNOSIS — E039 Hypothyroidism, unspecified: Secondary | ICD-10-CM | POA: Diagnosis not present

## 2012-07-22 DIAGNOSIS — I119 Hypertensive heart disease without heart failure: Secondary | ICD-10-CM | POA: Diagnosis not present

## 2012-07-22 DIAGNOSIS — R0989 Other specified symptoms and signs involving the circulatory and respiratory systems: Secondary | ICD-10-CM | POA: Diagnosis not present

## 2012-07-27 DIAGNOSIS — I10 Essential (primary) hypertension: Secondary | ICD-10-CM | POA: Diagnosis not present

## 2012-07-27 DIAGNOSIS — Z7901 Long term (current) use of anticoagulants: Secondary | ICD-10-CM | POA: Diagnosis not present

## 2012-07-27 DIAGNOSIS — I4891 Unspecified atrial fibrillation: Secondary | ICD-10-CM | POA: Diagnosis not present

## 2012-08-10 DIAGNOSIS — I10 Essential (primary) hypertension: Secondary | ICD-10-CM | POA: Diagnosis not present

## 2012-08-10 DIAGNOSIS — I4891 Unspecified atrial fibrillation: Secondary | ICD-10-CM | POA: Diagnosis not present

## 2012-08-10 DIAGNOSIS — Z7901 Long term (current) use of anticoagulants: Secondary | ICD-10-CM | POA: Diagnosis not present

## 2012-08-11 DIAGNOSIS — Z7901 Long term (current) use of anticoagulants: Secondary | ICD-10-CM | POA: Diagnosis not present

## 2012-08-13 DIAGNOSIS — R748 Abnormal levels of other serum enzymes: Secondary | ICD-10-CM | POA: Diagnosis not present

## 2012-08-13 DIAGNOSIS — N189 Chronic kidney disease, unspecified: Secondary | ICD-10-CM | POA: Diagnosis not present

## 2012-08-19 DIAGNOSIS — I10 Essential (primary) hypertension: Secondary | ICD-10-CM | POA: Diagnosis not present

## 2012-08-19 DIAGNOSIS — L03119 Cellulitis of unspecified part of limb: Secondary | ICD-10-CM | POA: Diagnosis not present

## 2012-08-19 DIAGNOSIS — L02419 Cutaneous abscess of limb, unspecified: Secondary | ICD-10-CM | POA: Diagnosis not present

## 2012-08-19 DIAGNOSIS — Z7901 Long term (current) use of anticoagulants: Secondary | ICD-10-CM | POA: Diagnosis not present

## 2012-08-20 DIAGNOSIS — I10 Essential (primary) hypertension: Secondary | ICD-10-CM | POA: Diagnosis not present

## 2012-08-20 DIAGNOSIS — I4891 Unspecified atrial fibrillation: Secondary | ICD-10-CM | POA: Diagnosis not present

## 2012-08-20 DIAGNOSIS — Z7901 Long term (current) use of anticoagulants: Secondary | ICD-10-CM | POA: Diagnosis not present

## 2012-08-21 DIAGNOSIS — L02419 Cutaneous abscess of limb, unspecified: Secondary | ICD-10-CM | POA: Diagnosis not present

## 2012-08-21 DIAGNOSIS — L03119 Cellulitis of unspecified part of limb: Secondary | ICD-10-CM | POA: Diagnosis not present

## 2012-09-03 DIAGNOSIS — I10 Essential (primary) hypertension: Secondary | ICD-10-CM | POA: Diagnosis not present

## 2012-09-03 DIAGNOSIS — I4891 Unspecified atrial fibrillation: Secondary | ICD-10-CM | POA: Diagnosis not present

## 2012-09-03 DIAGNOSIS — Z7901 Long term (current) use of anticoagulants: Secondary | ICD-10-CM | POA: Diagnosis not present

## 2012-09-10 DIAGNOSIS — L02419 Cutaneous abscess of limb, unspecified: Secondary | ICD-10-CM | POA: Diagnosis not present

## 2012-09-10 DIAGNOSIS — L03119 Cellulitis of unspecified part of limb: Secondary | ICD-10-CM | POA: Diagnosis not present

## 2012-09-17 DIAGNOSIS — I4891 Unspecified atrial fibrillation: Secondary | ICD-10-CM | POA: Diagnosis not present

## 2012-09-17 DIAGNOSIS — Z7901 Long term (current) use of anticoagulants: Secondary | ICD-10-CM | POA: Diagnosis not present

## 2012-09-17 DIAGNOSIS — I10 Essential (primary) hypertension: Secondary | ICD-10-CM | POA: Diagnosis not present

## 2012-10-01 DIAGNOSIS — I4891 Unspecified atrial fibrillation: Secondary | ICD-10-CM | POA: Diagnosis not present

## 2012-10-01 DIAGNOSIS — Z7901 Long term (current) use of anticoagulants: Secondary | ICD-10-CM | POA: Diagnosis not present

## 2012-10-01 DIAGNOSIS — I10 Essential (primary) hypertension: Secondary | ICD-10-CM | POA: Diagnosis not present

## 2012-11-02 DIAGNOSIS — Z7901 Long term (current) use of anticoagulants: Secondary | ICD-10-CM | POA: Diagnosis not present

## 2012-11-02 DIAGNOSIS — I4891 Unspecified atrial fibrillation: Secondary | ICD-10-CM | POA: Diagnosis not present

## 2012-11-02 DIAGNOSIS — L600 Ingrowing nail: Secondary | ICD-10-CM | POA: Diagnosis not present

## 2012-11-09 ENCOUNTER — Other Ambulatory Visit: Payer: Self-pay | Admitting: Internal Medicine

## 2012-11-09 DIAGNOSIS — R748 Abnormal levels of other serum enzymes: Secondary | ICD-10-CM

## 2012-11-11 DIAGNOSIS — L600 Ingrowing nail: Secondary | ICD-10-CM | POA: Diagnosis not present

## 2012-11-12 ENCOUNTER — Ambulatory Visit
Admission: RE | Admit: 2012-11-12 | Discharge: 2012-11-12 | Disposition: A | Discharge status: Home / Self Care | Payer: Medicare Other | Source: Ambulatory Visit | Attending: Internal Medicine | Admitting: Internal Medicine

## 2012-11-12 DIAGNOSIS — K7689 Other specified diseases of liver: Secondary | ICD-10-CM | POA: Diagnosis not present

## 2012-11-12 DIAGNOSIS — R748 Abnormal levels of other serum enzymes: Secondary | ICD-10-CM

## 2012-11-23 DIAGNOSIS — I4891 Unspecified atrial fibrillation: Secondary | ICD-10-CM | POA: Diagnosis not present

## 2012-11-23 DIAGNOSIS — I10 Essential (primary) hypertension: Secondary | ICD-10-CM | POA: Diagnosis not present

## 2012-11-23 DIAGNOSIS — Z006 Encounter for examination for normal comparison and control in clinical research program: Secondary | ICD-10-CM | POA: Diagnosis not present

## 2012-11-23 DIAGNOSIS — Z7901 Long term (current) use of anticoagulants: Secondary | ICD-10-CM | POA: Diagnosis not present

## 2012-11-23 DIAGNOSIS — E7800 Pure hypercholesterolemia, unspecified: Secondary | ICD-10-CM | POA: Diagnosis not present

## 2012-12-10 DIAGNOSIS — K7689 Other specified diseases of liver: Secondary | ICD-10-CM | POA: Diagnosis not present

## 2012-12-24 DIAGNOSIS — I4891 Unspecified atrial fibrillation: Secondary | ICD-10-CM | POA: Diagnosis not present

## 2012-12-24 DIAGNOSIS — Z7901 Long term (current) use of anticoagulants: Secondary | ICD-10-CM | POA: Diagnosis not present

## 2013-01-06 ENCOUNTER — Encounter: Payer: Self-pay | Admitting: Cardiovascular Disease

## 2013-01-06 ENCOUNTER — Ambulatory Visit (INDEPENDENT_AMBULATORY_CARE_PROVIDER_SITE_OTHER): Payer: Medicare Other | Admitting: Cardiovascular Disease

## 2013-01-06 VITALS — BP 126/58 | HR 54 | Ht 69.0 in | Wt 154.0 lb

## 2013-01-06 DIAGNOSIS — Z7901 Long term (current) use of anticoagulants: Secondary | ICD-10-CM

## 2013-01-06 DIAGNOSIS — I4891 Unspecified atrial fibrillation: Secondary | ICD-10-CM

## 2013-01-06 DIAGNOSIS — E785 Hyperlipidemia, unspecified: Secondary | ICD-10-CM

## 2013-01-06 DIAGNOSIS — N183 Chronic kidney disease, stage 3 unspecified: Secondary | ICD-10-CM

## 2013-01-06 DIAGNOSIS — I480 Paroxysmal atrial fibrillation: Secondary | ICD-10-CM

## 2013-01-06 DIAGNOSIS — R600 Localized edema: Secondary | ICD-10-CM | POA: Insufficient documentation

## 2013-01-06 DIAGNOSIS — R609 Edema, unspecified: Secondary | ICD-10-CM

## 2013-01-06 MED ORDER — ROSUVASTATIN CALCIUM 10 MG PO TABS: ORAL_TABLET | ORAL | Status: DC

## 2013-01-06 NOTE — Progress Notes (Signed)
Patient ID: Tanner Ho, male   DOB: 06-14-1940, 72 y.o.   MRN: [id number]     HPI: Tanner Ho, is a 72 y.o. male who presents to the office today for six-month cardiology evaluation.  [name] is a 72 year old gentleman with a history of mitral valve prolapse, hypertension, hyperlipidemia, hypothyroidism, as well as paroxysmal atrial fibrillation. He is on chronic Coumadin anticoagulation. His last documented atrial fibrillation episode was in October 2013. A nuclear perfusion study in October 2013 reveals normal perfusion imaging but he developed 1-2 mm of ST segment depression at peak stress test and the possibility of microvascular etiology leading to his ECG abnormalities. A subsequent cardiopulmonary that test was done on 09/17/2012. His peak maximum oxygen consumption was reduced at 58%. He had a suboptimal peak cardiovascular stress load making cardiovascular status interpretation indeterminate. He did have mild ventilation/perfusion mismatch suggesting impaired ulnar circulation plus minus increased dead space. He had a blunted chronotropic response to exercise. The test was limited by leg fatigue. When I saw him in the office subsequently I reduced his Cardizem from 240-180 mg. At times, he notes a rare palpitation. He denies any awareness of breakthrough atrial fibrillation. He denies chest pain. He presents for evaluation.  Past Medical History  Diagnosis Date  . Atrial fibrillation   . Hypertension     No past surgical history on file.  No Known Allergies  Current Outpatient Prescriptions  Medication Sig Dispense Refill  . alfuzosin (UROXATRAL) 10 MG 24 hr tablet Take 10 mg by mouth daily.      Marland Kitchen amiodarone (PACERONE) 200 MG tablet Take 200 mg by mouth daily.       Marland Kitchen diltiazem (DILACOR XR) 180 MG 24 hr capsule Take 180 mg by mouth daily.      Marland Kitchen docusate sodium (COLACE) 100 MG capsule Take 100 mg by mouth 2 (two) times daily.      . finasteride (PROSCAR) 5 MG tablet Take  5 mg by mouth daily.      . fish oil-omega-3 fatty acids 1000 MG capsule Take 1 g by mouth 2 (two) times daily.      Marland Kitchen FLOVENT HFA 110 MCG/ACT inhaler 1 puff in he am and 1 puff in the pm      . levothyroxine (SYNTHROID, LEVOTHROID) 25 MCG tablet Take 25 mcg by mouth daily.      Marland Kitchen losartan (COZAAR) 50 MG tablet Take 50 mg by mouth 2 (two) times daily.       . metoprolol succinate (TOPROL-XL) 25 MG 24 hr tablet Take 12.5 mg by mouth daily.      . Multiple Vitamins-Minerals (PRESERVISION AREDS PO) Take 1 tablet by mouth 2 (two) times daily.      . rosuvastatin (CRESTOR) 5 MG tablet Take 5 mg by mouth daily.      Marland Kitchen warfarin (COUMADIN) 5 MG tablet Take 6.5 mg by mouth daily.       No current facility-administered medications for this visit.    History   Social History  . Marital Status: Married    Spouse Name: N/A    Number of Children: N/A  . Years of Education: N/A   Occupational History  . Not on file.   Social History Main Topics  . Smoking status: Never Smoker   . Smokeless tobacco: Not on file  . Alcohol Use: No  . Drug Use: Not on file  . Sexually Active: Not on file   Other Topics Concern  . Not  on file   Social History Narrative  . No narrative on file      ROS is negative for fevers, chills or night sweats.  He denies visual symptoms. There is no chest pain. There is some mild shortness of breath with activity. He is an occasional to rare palpitation. There is no syncope. He denies abdominal pain. He denies bleeding. At times he does note some mild imbalance. Also does note some rare swelling of his ankles bilaterally. He had stopped taking his hydrochlorothiazide which he was taking infrequently. Other system review is negative.  PE BP 126/58  Pulse 54  Ht 5\' 9"  (1.753 m)  Wt 154 lb (69.854 kg)  BMI 22.73 kg/m2 Repeat blood pressure by me was 142/70 supine and standing position blood pressure is 130/70 without significant increase orthostatic pulse  rise. General: Alert, oriented, no distress.  Skin: normal turgor, no rashes HEENT: Normocephalic, atraumatic. Pupils round and reactive; sclera anicteric;no lid lag.  Nose without nasal septal hypertrophy Mouth/Parynx benign; Mallinpatti scale 3 Neck: No JVD, no carotid briuts Lungs: clear to ausculatation and percussion; no wheezing or rales Heart: RRR, s1 s2 normal there were occasional ectopic complex Abdomen: soft, nontender; no hepatosplenomehaly, BS+; abdominal aorta nontender and not dilated by palpation. Pulses 2+ Extremities: Trace ankle edema, no clubbing cyanosis, Homan's sign negative  Neurologic: grossly nonfocal  ECG: Sinus rhythm at 54 beats per minute. No ectopy on ECG. Intervals normal  LABS:  BMET    Component Value Date/Time   NA 142 03/13/2012 0213   K 4.4 03/13/2012 0213   CL 108 03/13/2012 0213   CO2 23 03/13/2012 0213   GLUCOSE 111* 03/13/2012 0213   BUN 19 03/13/2012 0213   CREATININE 1.29 03/13/2012 0213   CALCIUM 9.3 03/13/2012 0213   GFRNONAA 54* 03/13/2012 0213   GFRAA 63* 03/13/2012 0213     Hepatic Function Panel  No results found for this basename: prot, albumin, ast, alt, alkphos, bilitot, bilidir, ibili     CBC    Component Value Date/Time   WBC 4.8 03/13/2012 0213   RBC 4.31 03/13/2012 0213   HGB 15.3 03/13/2012 0213   HCT 42.8 03/13/2012 0213   PLT 126* 03/13/2012 0213   MCV 99.3 03/13/2012 0213   MCH 35.5* 03/13/2012 0213   MCHC 35.7 03/13/2012 0213   RDW 12.7 03/13/2012 0213     BNP No results found for this basename: probnp    Lipid Panel  No results found for this basename: chol, trig, hdl, cholhdl, vldl, ldlcalc     RADIOLOGY: No results found.    ASSESSMENT AND PLAN: Ms. Tanner Ho seems to be maintaining sinus rhythm. He brought with him blood pressure recordings at home and these seem to be consistently in the normal range. He was not orthostatic on exam today the I did pick up short bursts of ectopy the for  this reason, I elected not to further reduce his rate control medication. He does have a prescription for HCTZ to take a recommended he take this on a when necessary basis for ankle edema. Dr. Renne Crigler will be checking laboratory as well as that these be forwarded to my review. I will see him in 6 months followup evaluation.     Lennette Bihari, MD, [facility]  01/06/2013 2:43 PM

## 2013-01-06 NOTE — Patient Instructions (Signed)
Your physician recommends that you schedule a follow-up appointment in: 6 MONTHS. No changes has been made today. 

## 2013-01-18 DIAGNOSIS — L570 Actinic keratosis: Secondary | ICD-10-CM | POA: Diagnosis not present

## 2013-01-18 DIAGNOSIS — L821 Other seborrheic keratosis: Secondary | ICD-10-CM | POA: Diagnosis not present

## 2013-01-18 DIAGNOSIS — Z85828 Personal history of other malignant neoplasm of skin: Secondary | ICD-10-CM | POA: Diagnosis not present

## 2013-01-18 DIAGNOSIS — Z808 Family history of malignant neoplasm of other organs or systems: Secondary | ICD-10-CM | POA: Diagnosis not present

## 2013-01-21 DIAGNOSIS — Z7901 Long term (current) use of anticoagulants: Secondary | ICD-10-CM | POA: Diagnosis not present

## 2013-01-21 DIAGNOSIS — I10 Essential (primary) hypertension: Secondary | ICD-10-CM | POA: Diagnosis not present

## 2013-01-21 DIAGNOSIS — I4891 Unspecified atrial fibrillation: Secondary | ICD-10-CM | POA: Diagnosis not present

## 2013-01-25 DIAGNOSIS — H353190 Nonexudative age-related macular degeneration, unspecified eye, stage unspecified: Secondary | ICD-10-CM | POA: Diagnosis not present

## 2013-01-25 DIAGNOSIS — H43399 Other vitreous opacities, unspecified eye: Secondary | ICD-10-CM | POA: Diagnosis not present

## 2013-01-25 DIAGNOSIS — H43819 Vitreous degeneration, unspecified eye: Secondary | ICD-10-CM | POA: Diagnosis not present

## 2013-01-25 DIAGNOSIS — H33309 Unspecified retinal break, unspecified eye: Secondary | ICD-10-CM | POA: Diagnosis not present

## 2013-02-18 DIAGNOSIS — I4891 Unspecified atrial fibrillation: Secondary | ICD-10-CM | POA: Diagnosis not present

## 2013-02-18 DIAGNOSIS — I10 Essential (primary) hypertension: Secondary | ICD-10-CM | POA: Diagnosis not present

## 2013-02-18 DIAGNOSIS — Z7901 Long term (current) use of anticoagulants: Secondary | ICD-10-CM | POA: Diagnosis not present

## 2013-02-23 DIAGNOSIS — D485 Neoplasm of uncertain behavior of skin: Secondary | ICD-10-CM | POA: Diagnosis not present

## 2013-02-23 DIAGNOSIS — Z85828 Personal history of other malignant neoplasm of skin: Secondary | ICD-10-CM | POA: Diagnosis not present

## 2013-02-23 DIAGNOSIS — C44621 Squamous cell carcinoma of skin of unspecified upper limb, including shoulder: Secondary | ICD-10-CM | POA: Diagnosis not present

## 2013-03-12 DIAGNOSIS — C44621 Squamous cell carcinoma of skin of unspecified upper limb, including shoulder: Secondary | ICD-10-CM | POA: Diagnosis not present

## 2013-03-12 DIAGNOSIS — C44601 Unspecified malignant neoplasm of skin of unspecified upper limb, including shoulder: Secondary | ICD-10-CM | POA: Diagnosis not present

## 2013-03-18 DIAGNOSIS — I4891 Unspecified atrial fibrillation: Secondary | ICD-10-CM | POA: Diagnosis not present

## 2013-03-18 DIAGNOSIS — Z7901 Long term (current) use of anticoagulants: Secondary | ICD-10-CM | POA: Diagnosis not present

## 2013-03-18 DIAGNOSIS — I10 Essential (primary) hypertension: Secondary | ICD-10-CM | POA: Diagnosis not present

## 2013-03-24 DIAGNOSIS — E7800 Pure hypercholesterolemia, unspecified: Secondary | ICD-10-CM | POA: Diagnosis not present

## 2013-03-24 DIAGNOSIS — I10 Essential (primary) hypertension: Secondary | ICD-10-CM | POA: Diagnosis not present

## 2013-03-24 DIAGNOSIS — Z23 Encounter for immunization: Secondary | ICD-10-CM | POA: Diagnosis not present

## 2013-03-24 DIAGNOSIS — Z0000 Encounter for general adult medical examination without abnormal findings: Secondary | ICD-10-CM | POA: Diagnosis not present

## 2013-03-24 DIAGNOSIS — K219 Gastro-esophageal reflux disease without esophagitis: Secondary | ICD-10-CM | POA: Diagnosis not present

## 2013-03-24 DIAGNOSIS — Z125 Encounter for screening for malignant neoplasm of prostate: Secondary | ICD-10-CM | POA: Diagnosis not present

## 2013-03-29 DIAGNOSIS — Z7901 Long term (current) use of anticoagulants: Secondary | ICD-10-CM | POA: Diagnosis not present

## 2013-03-29 DIAGNOSIS — I10 Essential (primary) hypertension: Secondary | ICD-10-CM | POA: Diagnosis not present

## 2013-03-29 DIAGNOSIS — K219 Gastro-esophageal reflux disease without esophagitis: Secondary | ICD-10-CM | POA: Diagnosis not present

## 2013-03-29 DIAGNOSIS — I4891 Unspecified atrial fibrillation: Secondary | ICD-10-CM | POA: Diagnosis not present

## 2013-04-08 ENCOUNTER — Other Ambulatory Visit: Payer: Self-pay | Admitting: Cardiology

## 2013-04-08 ENCOUNTER — Other Ambulatory Visit: Payer: Self-pay

## 2013-04-09 NOTE — Telephone Encounter (Signed)
Rx was sent to pharmacy electronically. 

## 2013-04-14 ENCOUNTER — Emergency Department (HOSPITAL_COMMUNITY)
Admission: EM | Admit: 2013-04-14 | Discharge: 2013-04-15 | Disposition: A | Discharge status: Home / Self Care | Payer: Medicare Other | Attending: Emergency Medicine | Admitting: Emergency Medicine

## 2013-04-14 ENCOUNTER — Encounter (HOSPITAL_COMMUNITY): Payer: Self-pay | Admitting: Emergency Medicine

## 2013-04-14 ENCOUNTER — Emergency Department (HOSPITAL_COMMUNITY): Payer: Medicare Other

## 2013-04-14 DIAGNOSIS — R079 Chest pain, unspecified: Secondary | ICD-10-CM | POA: Insufficient documentation

## 2013-04-14 DIAGNOSIS — Z8719 Personal history of other diseases of the digestive system: Secondary | ICD-10-CM | POA: Diagnosis not present

## 2013-04-14 DIAGNOSIS — I10 Essential (primary) hypertension: Secondary | ICD-10-CM | POA: Insufficient documentation

## 2013-04-14 DIAGNOSIS — IMO0002 Reserved for concepts with insufficient information to code with codable children: Secondary | ICD-10-CM | POA: Insufficient documentation

## 2013-04-14 DIAGNOSIS — Z7901 Long term (current) use of anticoagulants: Secondary | ICD-10-CM | POA: Insufficient documentation

## 2013-04-14 DIAGNOSIS — R1013 Epigastric pain: Secondary | ICD-10-CM | POA: Insufficient documentation

## 2013-04-14 DIAGNOSIS — Z79899 Other long term (current) drug therapy: Secondary | ICD-10-CM | POA: Diagnosis not present

## 2013-04-14 DIAGNOSIS — I4891 Unspecified atrial fibrillation: Secondary | ICD-10-CM | POA: Insufficient documentation

## 2013-04-14 DIAGNOSIS — J42 Unspecified chronic bronchitis: Secondary | ICD-10-CM | POA: Diagnosis not present

## 2013-04-14 DIAGNOSIS — R0789 Other chest pain: Secondary | ICD-10-CM | POA: Diagnosis not present

## 2013-04-14 DIAGNOSIS — J449 Chronic obstructive pulmonary disease, unspecified: Secondary | ICD-10-CM | POA: Diagnosis not present

## 2013-04-14 HISTORY — DX: Gastro-esophageal reflux disease without esophagitis: K21.9

## 2013-04-14 LAB — CBC
HCT: 40.1 % (ref 39.0–52.0)
MCHC: 34.2 g/dL (ref 30.0–36.0)
MCV: 102 fL — ABNORMAL HIGH (ref 78.0–100.0)
Platelets: 135 10*3/uL — ABNORMAL LOW (ref 150–400)
RDW: 13.7 % (ref 11.5–15.5)
WBC: 4.2 10*3/uL (ref 4.0–10.5)

## 2013-04-14 LAB — BASIC METABOLIC PANEL
BUN: 16 mg/dL (ref 6–23)
Chloride: 105 mEq/L (ref 96–112)
Creatinine, Ser: 1.26 mg/dL (ref 0.50–1.35)
GFR calc Af Amer: 64 mL/min — ABNORMAL LOW (ref >90)
GFR calc non Af Amer: 55 mL/min — ABNORMAL LOW (ref >90)

## 2013-04-14 LAB — POCT I-STAT TROPONIN I: Troponin i, poc: 0.01 ng/mL (ref 0.00–0.08)

## 2013-04-14 NOTE — ED Notes (Addendum)
Presents with epigastric pain with radiation into the left chest began 3-4 weeks and has been controlled with nexium and zantac, today pt reports dizziness, epigastric pain without relief with GERD medications. Denies nauseas, dnies SOB. Nothing makes pain worse. Today nothing makes pain better.  Pain is intermittent

## 2013-04-14 NOTE — ED Notes (Signed)
I gave the patient a warm blanket. 

## 2013-04-15 ENCOUNTER — Telehealth: Payer: Self-pay | Admitting: Cardiovascular Disease

## 2013-04-15 DIAGNOSIS — Z7901 Long term (current) use of anticoagulants: Secondary | ICD-10-CM | POA: Diagnosis not present

## 2013-04-15 DIAGNOSIS — I4891 Unspecified atrial fibrillation: Secondary | ICD-10-CM | POA: Diagnosis not present

## 2013-04-15 MED ORDER — ASPIRIN 81 MG PO CHEW
324.0000 mg | CHEWABLE_TABLET | Freq: Once | ORAL | Status: AC
  Administered 2013-04-15: 324 mg via ORAL
  Filled 2013-04-15: qty 4

## 2013-04-15 NOTE — ED Provider Notes (Signed)
CSN: [id number]     Arrival date & time 04/14/13  2147 History   First MD Initiated Contact with Patient 04/14/13 2330     Chief Complaint  Patient presents with  . Chest Pain   (Consider location/radiation/quality/duration/timing/severity/associated sxs/prior Treatment) HPI Comments: 72 yo male with a fib, htn, gerd hx presents with worsening chest pressure/ discomfort similar to previous sxs the past 4 wks but becoming more severe and not relieved by gerd meds.  Non radiating, no diaphoresis or exertional sxs.  Per pt stress the past year okay.  Cardiology Dr Oletta Darter.  Pt on nexium.  No new foods.  Sxs have moved to left chest as well as epig.  Pt feels well otherwise, no recent surgeries.  Discomfort intermittent all day, no specific timing.  Patient is a 72 y.o. male presenting with chest pain. The history is provided by the patient.  Chest Pain Associated symptoms: abdominal pain (epig)   Associated symptoms: no back pain, no cough, no fever, no headache, no shortness of breath and not vomiting     Past Medical History  Diagnosis Date  . Atrial fibrillation   . Hypertension   . GERD (gastroesophageal reflux disease)    History reviewed. No pertinent past surgical history. History reviewed. No pertinent family history. History  Substance Use Topics  . Smoking status: Never Smoker   . Smokeless tobacco: Not on file  . Alcohol Use: No    Review of Systems  Constitutional: Negative for fever and chills.  HENT: Negative for congestion.   Eyes: Negative for visual disturbance.  Respiratory: Negative for cough and shortness of breath.   Cardiovascular: Positive for chest pain.  Gastrointestinal: Positive for abdominal pain (epig). Negative for vomiting.  Genitourinary: Negative for dysuria and flank pain.  Musculoskeletal: Negative for back pain, neck pain and neck stiffness.  Skin: Negative for rash.  Neurological: Negative for light-headedness and headaches.     Allergies  Ciprofloxacin and Hytrin  Home Medications   Current Outpatient Rx  Name  Route  Sig  Dispense  Refill  . acetaminophen (TYLENOL) 500 MG tablet   Oral   Take 500 mg by mouth every 6 (six) hours as needed for moderate pain.         Marland Kitchen alfuzosin (UROXATRAL) 10 MG 24 hr tablet   Oral   Take 10 mg by mouth daily.         Marland Kitchen amiodarone (PACERONE) 200 MG tablet   Oral   Take 200 mg by mouth daily.         Marland Kitchen diltiazem (DILACOR XR) 180 MG 24 hr capsule   Oral   Take 180 mg by mouth daily.         Marland Kitchen docusate sodium (COLACE) 100 MG capsule   Oral   Take 100 mg by mouth daily.          . finasteride (PROSCAR) 5 MG tablet   Oral   Take 5 mg by mouth daily.         . fish oil-omega-3 fatty acids 1000 MG capsule   Oral   Take 1 g by mouth 2 (two) times daily.         Marland Kitchen FLOVENT HFA 110 MCG/ACT inhaler      1 puff in he am and 1 puff in the pm         . levothyroxine (SYNTHROID, LEVOTHROID) 25 MCG tablet   Oral   Take 25 mcg by mouth daily. Take  at least 60 minutes before breakfast         . losartan (COZAAR) 50 MG tablet   Oral   Take 50 mg by mouth 2 (two) times daily.          . magnesium hydroxide (MILK OF MAGNESIA) 800 MG/5ML suspension   Oral   Take 5 mLs by mouth daily as needed for constipation.         . metoprolol succinate (TOPROL-XL) 25 MG 24 hr tablet   Oral   Take 12.5 mg by mouth daily.         . Multiple Vitamins-Minerals (PRESERVISION AREDS) CAPS   Oral   Take 1 capsule by mouth 2 (two) times daily. VITAMIN A FREE         . OVER THE COUNTER MEDICATION   Apply externally   Apply 1 application topically daily.         Bertram Gala Glycol-Propyl Glycol (SYSTANE) 0.4-0.3 % SOLN   Ophthalmic   Apply 2 drops to eye daily as needed (for dry eeys).         . polyethylene glycol (MIRALAX / GLYCOLAX) packet   Oral   Take 17 g by mouth daily.         . rosuvastatin (CRESTOR) 5 MG tablet   Oral   Take 5 mg by  mouth daily at 6 PM.          . warfarin (COUMADIN) 5 MG tablet   Oral   Take 2.5-5 mg by mouth daily. Takes 2.5mg  on thursdays Takes 5mg  all other days          BP 146/94  Pulse 106  Temp(Src) 97.6 F (36.4 C) (Oral)  Resp 33  Wt 158 lb 4 oz (71.782 kg)  SpO2 94% Physical Exam  Nursing note and vitals reviewed. Constitutional: He is oriented to person, place, and time. He appears well-developed and well-nourished.  HENT:  Head: Normocephalic and atraumatic.  Eyes: Conjunctivae are normal. Right eye exhibits no discharge. Left eye exhibits no discharge.  Neck: Normal range of motion. Neck supple. No tracheal deviation present.  Cardiovascular: Normal rate, regular rhythm and intact distal pulses.   Pulmonary/Chest: Effort normal and breath sounds normal.  Abdominal: Soft. He exhibits no distension. There is no tenderness. There is no guarding.  Musculoskeletal: He exhibits no edema.  Neurological: He is alert and oriented to person, place, and time.  Skin: Skin is warm. No rash noted.  Psychiatric: He has a normal mood and affect.    ED Course  Procedures (including critical care time) Labs Review Labs Reviewed  CBC - Abnormal; Notable for the following:    RBC 3.93 (*)    MCV 102.0 (*)    MCH 34.9 (*)    Platelets 135 (*)    All other components within normal limits  BASIC METABOLIC PANEL - Abnormal; Notable for the following:    Glucose, Bld 128 (*)    GFR calc non Af Amer 55 (*)    GFR calc Af Amer 64 (*)    All other components within normal limits  TROPONIN I  POCT I-STAT TROPONIN I   Imaging Review Dg Chest 2 View  04/14/2013   CLINICAL DATA:  Chest pain.  EXAM: CHEST  2 VIEW  COMPARISON:  03/13/2012  FINDINGS: The cardiac silhouette remains borderline enlarged. The lungs remain mildly hyperexpanded with mild central peribronchial thickening. Mild scoliosis. Thoracic spine degenerative changes. Diffuse osteopenia.  IMPRESSION: No acute abnormality. Stable  borderline cardiomegaly and mild changes of COPD and chronic bronchitis.   Electronically Signed   By: Gordan Payment M.D.   On: 04/14/2013 23:31    EKG Interpretation     Ventricular Rate:  60 PR Interval:  164 QRS Duration: 94 QT Interval:  452 QTC Calculation: 452 R Axis:   -44 Text Interpretation:  Normal sinus rhythm Left axis deviation Abnormal ECG similar to previous            MDM   1. Chest pain   2. Epigastric pain    Sxs worse than normal gerd. With age/ htn plan for cardiac eval. No sxs on recheck. ASA given. Second troponin neg, no sxs.  Discussed observation vs close outpt fup with pcp/ cardiology within 48 hrs. Pt prefers outpt fup and will call cardiology in am, he understands and his wife dose that even though this is atypical sxs And possibly gerd this may be heart and CAD.  Results and differential diagnosis were discussed with the patient. Close follow up outpatient was discussed, patient comfortable with the plan.   Diagnosis: Epig pain, CP, a fibhx      Enid Skeens, MD 04/15/13 [phone]

## 2013-04-15 NOTE — Telephone Encounter (Signed)
Please call-if not at home please call on his [phone].Went to ER last night and left this morning  around 4:00 and told him to call his cardiologist today.

## 2013-04-15 NOTE — Telephone Encounter (Signed)
Returned call and pt verified x 2.  Pt informed message received and ER note states to f/u with cardiology.  Appt scheduled for tomorrow at 9am w/ Nada Boozer, NP.  Pt verbalized understanding and agreed w/ plan.

## 2013-04-16 ENCOUNTER — Encounter: Payer: Self-pay | Admitting: Cardiology

## 2013-04-16 ENCOUNTER — Ambulatory Visit (INDEPENDENT_AMBULATORY_CARE_PROVIDER_SITE_OTHER): Payer: Medicare Other | Admitting: Cardiology

## 2013-04-16 VITALS — BP 120/58 | HR 56 | Ht 69.0 in | Wt 156.0 lb

## 2013-04-16 DIAGNOSIS — Z0389 Encounter for observation for other suspected diseases and conditions ruled out: Secondary | ICD-10-CM | POA: Diagnosis not present

## 2013-04-16 DIAGNOSIS — I4891 Unspecified atrial fibrillation: Secondary | ICD-10-CM

## 2013-04-16 DIAGNOSIS — R079 Chest pain, unspecified: Secondary | ICD-10-CM | POA: Diagnosis not present

## 2013-04-16 DIAGNOSIS — I10 Essential (primary) hypertension: Secondary | ICD-10-CM

## 2013-04-16 DIAGNOSIS — Z7901 Long term (current) use of anticoagulants: Secondary | ICD-10-CM

## 2013-04-16 DIAGNOSIS — I480 Paroxysmal atrial fibrillation: Secondary | ICD-10-CM

## 2013-04-16 NOTE — Assessment & Plan Note (Signed)
Pt denies any recurrence.

## 2013-04-16 NOTE — Assessment & Plan Note (Signed)
Followed by Mercy Riding, PhD.

## 2013-04-16 NOTE — Assessment & Plan Note (Signed)
Neg. nuc study 03/2012, Met test 06/2012. Now with increasing pain will do lexiscan myoview, he cannot walk on treadmill

## 2013-04-16 NOTE — Progress Notes (Signed)
04/16/2013   PCP: Londell Moh, MD   Chief Complaint  Patient presents with  . Follow-up    was in hosp with chest pain one week ago    Primary Cardiologist: Dr. Tresa Endo  HPI:  72 year old white married male here today for followup after ER visit for chest pain 04/14/2013.  His lab studies and EKG in the emergency room were stable patient was given the option of being admitted versus outpatient workup and he preferred outpatient.  He does have a history of GERD but he presented with worsening chest pressure/discomfort kind of an aching pain midsternal to left anterior chest over the last 4 weeks but that day in the ER visit was more severe. For this GERD meds did not help. It was nonradiating discomfort and diaphoresis may be mild shortness of breath but no nausea.  Still has episodes on a daily basis.  He has a history of mitral valve prolapse, hypertension, hyperlipidemia, hypothyroidism, as well as paroxysmal atrial fibrillation. He is on chronic Coumadin anticoagulation. His last documented atrial fibrillation episode was in October 2013. A nuclear perfusion study in October 2013 reveals normal perfusion imaging but he developed 1-2 mm of ST segment depression at peak stress test and the possibility of microvascular etiology leading to his ECG abnormalities. A subsequent cardiopulmonary that test was done on 09/17/2012. His peak maximum oxygen consumption was reduced at 58%. He had a suboptimal peak cardiovascular stress load making cardiovascular status interpretation indeterminate. He did have mild ventilation/perfusion mismatch suggesting impaired ulnar circulation plus minus increased dead space. He had a blunted chronotropic response to exercise. The test was limited by leg fatigue. When Dr. Tresa Endo saw him in the office subsequently he reduced his Cardizem from 240-180 mg. At times, he notes a rare palpitation. He denies any awareness of breakthrough atrial  fibrillation.    Allergies  Allergen Reactions  . Ciprofloxacin Other (See Comments)    Cannot take due to currently taking Amiodarone  . Hytrin [Terazosin] Other (See Comments)    Syncopal event     Current Outpatient Prescriptions  Medication Sig Dispense Refill  . acetaminophen (TYLENOL) 500 MG tablet Take 500 mg by mouth every 6 (six) hours as needed for moderate pain.      Marland Kitchen alfuzosin (UROXATRAL) 10 MG 24 hr tablet Take 10 mg by mouth daily.      Marland Kitchen amiodarone (PACERONE) 200 MG tablet Take 200 mg by mouth daily.      Marland Kitchen diltiazem (CARDIZEM CD) 180 MG 24 hr capsule       . docusate sodium (COLACE) 100 MG capsule Take 100 mg by mouth daily.       . finasteride (PROSCAR) 5 MG tablet Take 5 mg by mouth daily.      . fish oil-omega-3 fatty acids 1000 MG capsule Take 1 g by mouth 2 (two) times daily.      Marland Kitchen FLOVENT HFA 110 MCG/ACT inhaler 1 puff in he am and 1 puff in the pm      . levothyroxine (SYNTHROID, LEVOTHROID) 25 MCG tablet Take 25 mcg by mouth daily. Take at least 60 minutes before breakfast      . losartan (COZAAR) 50 MG tablet Take 50 mg by mouth 2 (two) times daily.       . magnesium hydroxide (MILK OF MAGNESIA) 800 MG/5ML suspension Take 5 mLs by mouth daily as needed for constipation.      . metoprolol succinate (TOPROL-XL)  25 MG 24 hr tablet Take 12.5 mg by mouth daily.      . Multiple Vitamins-Minerals (PRESERVISION AREDS) CAPS Take 1 capsule by mouth 2 (two) times daily. VITAMIN A FREE      . OVER THE COUNTER MEDICATION Apply 1 application topically daily.      Bertram Gala Glycol-Propyl Glycol (SYSTANE) 0.4-0.3 % SOLN Apply 2 drops to eye daily as needed (for dry eeys).      . polyethylene glycol (MIRALAX / GLYCOLAX) packet Take 17 g by mouth daily.      . rosuvastatin (CRESTOR) 5 MG tablet Take 5 mg by mouth daily at 6 PM.       . warfarin (COUMADIN) 5 MG tablet Take 2.5-5 mg by mouth daily. Takes 2.5mg  on thursdays Takes 5mg  all other days       No current  facility-administered medications for this visit.    Past Medical History  Diagnosis Date  . Hypertension   . GERD (gastroesophageal reflux disease)   . MVP (mitral valve prolapse)     on cardiac cath  . Hyperlipidemia LDL goal < 100   . PAF (paroxysmal atrial fibrillation)     last episode 03/2012  . Chronic anticoagulation     on coumadin followed by Virgina Evener, PhD  . H/O cardiovascular stress test 03/2012    EKG with 1-2 mm ST segment depression but normal perfusion images. Followup metastases test reduced exercise effort and functional status and determinate for myocardial dysfunction    Past Surgical History  Procedure Laterality Date  . 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    ZOX:[id number]:UJ colds or fevers, no weight changes Skin:no rashes or ulcers HEENT:no blurred vision, no congestion CV:see HPI PUL:see HPI GI:no diarrhea constipation or melena, no indigestion GU:no hematuria, no dysuria MS:no joint pain, no claudication Neuro:no syncope, no lightheadedness Endo:no diabetes, no thyroid disease  PHYSICAL EXAM BP 120/58  Pulse 56  Ht 5\' 9"  (1.753 m)  Wt 156 lb (70.761 kg)  BMI 23.03 kg/m2 General:Pleasant affect, NAD Skin:Warm and dry, brisk capillary refill HEENT:normocephalic, sclera clear, mucus membranes moist Neck:supple, no JVD, no bruits  Heart:S1S2 RRR with 2/6 systolic murmur, no gallup, rub or click Lungs:clear without rales, rhonchi, or wheezes WJX:BJYN, non tender, + BS, do not palpate liver spleen or masses Ext: +1 ankle edema bil., 2+ pedal pulses, 2+ radial pulses Neuro:alert and oriented, MAE, follows commands, + facial symmetry  EKG:SB rate of 56 and no acute changes from ER visit or from 01/2013.   ASSESSMENT AND PLAN Chest pain at rest Pt seen in ER with chest pain, enzymes negative, EKG without changes.  Will do Lexiscan myoview to eval  angina.  Last cath 2000.  Nuc 1 yr ago was neg for ischemia.  He will follow up  Dr. Tresa Endo in 2-3 weeks.  PAF (paroxysmal atrial fibrillation), recurrent 03/12/12 Pt denies any recurrence.  Normal coronary arteries March 2000 Neg. nuc study 03/2012, Met test 06/2012. Now with increasing pain will do lexiscan myoview, he cannot walk on treadmill  HTN (hypertension) Stable but increased edema,I have asked pt to take HCTZ daily.  If edema still present on follow up visit he will discuss with Dr. Tresa Endo.  Chronic anticoagulation Followed by Mercy Riding, PhD.

## 2013-04-16 NOTE — Assessment & Plan Note (Signed)
Stable but increased edema,I have asked pt to take HCTZ daily.  If edema still present on follow up visit he will discuss with Dr. Tresa Endo.

## 2013-04-16 NOTE — Patient Instructions (Signed)
Call or go to ER if significant pain.  Stress test next week.    Follow up with Dr. Tresa Endo in 2-3 weeks if stress test positive we will address earlier.

## 2013-04-16 NOTE — Assessment & Plan Note (Signed)
Pt seen in ER with chest pain, enzymes negative, EKG without changes.  Will do Lexiscan myoview to eval angina.  Last cath 2000.  Nuc 1 yr ago was neg for ischemia.  He will follow up  Dr. Tresa Endo in 2-3 weeks.

## 2013-04-20 DIAGNOSIS — R339 Retention of urine, unspecified: Secondary | ICD-10-CM | POA: Diagnosis not present

## 2013-04-20 DIAGNOSIS — N139 Obstructive and reflux uropathy, unspecified: Secondary | ICD-10-CM | POA: Diagnosis not present

## 2013-04-20 DIAGNOSIS — N401 Enlarged prostate with lower urinary tract symptoms: Secondary | ICD-10-CM | POA: Diagnosis not present

## 2013-04-20 DIAGNOSIS — N312 Flaccid neuropathic bladder, not elsewhere classified: Secondary | ICD-10-CM | POA: Diagnosis not present

## 2013-04-22 ENCOUNTER — Ambulatory Visit (HOSPITAL_COMMUNITY)
Admission: RE | Admit: 2013-04-22 | Discharge: 2013-04-22 | Disposition: A | Discharge status: Home / Self Care | Payer: Medicare Other | Source: Ambulatory Visit | Attending: Cardiovascular Disease | Admitting: Cardiovascular Disease

## 2013-04-22 DIAGNOSIS — R079 Chest pain, unspecified: Secondary | ICD-10-CM | POA: Diagnosis not present

## 2013-04-22 MED ORDER — REGADENOSON 0.4 MG/5ML IV SOLN
0.4000 mg | Freq: Once | INTRAVENOUS | Status: AC
  Administered 2013-04-22: 0.4 mg via INTRAVENOUS

## 2013-04-22 MED ORDER — TECHNETIUM TC 99M SESTAMIBI GENERIC - CARDIOLITE
30.0000 | Freq: Once | INTRAVENOUS | Status: AC | PRN
  Administered 2013-04-22: 30 via INTRAVENOUS

## 2013-04-22 MED ORDER — TECHNETIUM TC 99M SESTAMIBI GENERIC - CARDIOLITE
10.0000 | Freq: Once | INTRAVENOUS | Status: AC | PRN
  Administered 2013-04-22: 10 via INTRAVENOUS

## 2013-04-22 NOTE — Procedures (Addendum)
[facility] [address] [place] [address] [phone]  Cardiology Nuclear Med Study  Tanner Ho is a 72 y.o. male     MRN : [id number]     DOB: [birth year]  Procedure Date: 04/22/2013  Nuclear Med Background Indication for Stress Test:  Evaluation for Ischemia and Post Hospital History:  Asthma, MVP and CKD;PAF; Cardiac Risk Factors: History of Smoking, Hypertension and Lipids  Symptoms:  Chest Pain, Diaphoresis, Dizziness, DOE, Light-Headedness, Palpitations and SOB   Nuclear Pre-Procedure Caffeine/Decaff Intake:  7:00pm NPO After: 5:00am   IV Site: R Forearm  IV 0.9% NS with Angio Cath:  22g  Chest Size (in):  42"  IV Started by: Emmit Pomfret, RN  Height: 5\' 9"  (1.753 m)  Cup Size: n/a  BMI:  Body mass index is 23.03 kg/(m^2). Weight:  156 lb (70.761 kg)   Tech Comments:  N/A    Nuclear Med Study 1 or 2 day study: 1 day  Stress Test Type:  Lexiscan  Order Authorizing Provider:  Nicki Guadalajara, MD   Resting Radionuclide: Technetium 36m Sestamibi  Resting Radionuclide Dose: 10.6 mCi   Stress Radionuclide:  Technetium 75m Sestamibi  Stress Radionuclide Dose: 30.3 mCi           Stress Protocol Rest HR: 54 Stress HR: 80  Rest BP: 189/85 Stress BP: 167/75  Exercise Time (min): n/a METS: n/a   Predicted Max HR: 148 bpm % Max HR: 57.43 bpm Rate Pressure Product: 21308  Dose of Adenosine (mg):  n/a Dose of Lexiscan: 0.4 mg  Dose of Atropine (mg): n/a Dose of Dobutamine: n/a mcg/kg/min (at max HR)  Stress Test Technologist: Esperanza Sheets, CCT Nuclear Technologist: Koren Shiver, CNMT   Rest Procedure:  Myocardial perfusion imaging was performed at rest 45 minutes following the intravenous administration of Technetium 23m Sestamibi. Stress Procedure:  The patient received IV Lexiscan 0.4 mg over 15-seconds.  Technetium 38m Sestamibi injected at 30-seconds.  There were no significant changes with Lexiscan.   Quantitative spect images were obtained after a 45 minute delay.  Transient Ischemic Dilatation (Normal <1.22):  0.94 Lung/Heart Ratio (Normal <0.45):  0.33 QGS EDV:  97 ml QGS ESV:  30 ml LV Ejection Fraction: 69%     Rest ECG: NSR - Normal EKG  Stress ECG: No significant change from baseline ECG  QPS Raw Data Images:  Normal; no motion artifact; normal heart/lung ratio. Stress Images:  Moderately decreased uptake in the mid and basal inferolateral wall. Rest Images:  Comparison with the stress images reveals no significant change. Subtraction (SDS):  No evidence of ischemia.  Impression Exercise Capacity:  Lexiscan with no exercise. BP Response:  Normal blood pressure response. Clinical Symptoms:  No significant symptoms noted. ECG Impression:  No significant ECG changes with Lexiscan. Comparison with Prior Nuclear Study: No images to compare  Overall Impression:  Low risk stress nuclear study with a fixed inferolateral defect, that is most likely due to a diaphragmatic attenuation artifact..  LV Wall Motion:  NL LV Function; NL Wall Motion   Shweta Aman, MD  04/22/2013 1:05 PM

## 2013-04-23 NOTE — Progress Notes (Signed)
Pt. Informed of stress test

## 2013-04-26 DIAGNOSIS — M899 Disorder of bone, unspecified: Secondary | ICD-10-CM | POA: Diagnosis not present

## 2013-04-26 DIAGNOSIS — I10 Essential (primary) hypertension: Secondary | ICD-10-CM | POA: Diagnosis not present

## 2013-04-26 DIAGNOSIS — K219 Gastro-esophageal reflux disease without esophagitis: Secondary | ICD-10-CM | POA: Diagnosis not present

## 2013-05-03 DIAGNOSIS — H3530 Unspecified macular degeneration: Secondary | ICD-10-CM | POA: Diagnosis not present

## 2013-05-03 DIAGNOSIS — H43819 Vitreous degeneration, unspecified eye: Secondary | ICD-10-CM | POA: Diagnosis not present

## 2013-05-03 DIAGNOSIS — H2640 Unspecified secondary cataract: Secondary | ICD-10-CM | POA: Diagnosis not present

## 2013-05-03 DIAGNOSIS — H5210 Myopia, unspecified eye: Secondary | ICD-10-CM | POA: Diagnosis not present

## 2013-05-04 DIAGNOSIS — I4891 Unspecified atrial fibrillation: Secondary | ICD-10-CM | POA: Diagnosis not present

## 2013-05-04 DIAGNOSIS — Z7901 Long term (current) use of anticoagulants: Secondary | ICD-10-CM | POA: Diagnosis not present

## 2013-05-04 DIAGNOSIS — I10 Essential (primary) hypertension: Secondary | ICD-10-CM | POA: Diagnosis not present

## 2013-05-10 ENCOUNTER — Other Ambulatory Visit: Payer: Self-pay | Admitting: Cardiology

## 2013-05-10 NOTE — Telephone Encounter (Signed)
Rx was sent to pharmacy electronically. 

## 2013-05-17 ENCOUNTER — Encounter: Payer: Self-pay | Admitting: Cardiovascular Disease

## 2013-05-17 ENCOUNTER — Ambulatory Visit (INDEPENDENT_AMBULATORY_CARE_PROVIDER_SITE_OTHER): Payer: Medicare Other | Admitting: Cardiovascular Disease

## 2013-05-17 VITALS — BP 130/70 | Ht 69.0 in | Wt 156.7 lb

## 2013-05-17 DIAGNOSIS — I10 Essential (primary) hypertension: Secondary | ICD-10-CM | POA: Diagnosis not present

## 2013-05-17 DIAGNOSIS — I4891 Unspecified atrial fibrillation: Secondary | ICD-10-CM

## 2013-05-17 DIAGNOSIS — Z7901 Long term (current) use of anticoagulants: Secondary | ICD-10-CM

## 2013-05-17 DIAGNOSIS — R079 Chest pain, unspecified: Secondary | ICD-10-CM | POA: Diagnosis not present

## 2013-05-17 DIAGNOSIS — E039 Hypothyroidism, unspecified: Secondary | ICD-10-CM

## 2013-05-17 DIAGNOSIS — E785 Hyperlipidemia, unspecified: Secondary | ICD-10-CM

## 2013-05-17 DIAGNOSIS — I480 Paroxysmal atrial fibrillation: Secondary | ICD-10-CM

## 2013-05-17 NOTE — Patient Instructions (Signed)
Your physician recommends that you schedule a follow-up appointment in: 6 months  Your physician has recommended you make the following change in your medication: Take Amiodarone 200 mg one day 100 mg the next day

## 2013-05-17 NOTE — Progress Notes (Signed)
Patient ID: Tanner Ho, male   DOB: [birth year], 72 y.o.   MRN: [id number]      HPI: Tanner Ho, is a 72 y.o. male who presents to the office today for follow-up cardiology evaluation following his recent emergency room evaluation and subsequent myocardial perfusion study.  Tanner Ho is a 72 year old gentleman with a history of mitral valve prolapse, hypertension, hyperlipidemia, hypothyroidism, as well as paroxysmal atrial fibrillation. He is on chronic Coumadin anticoagulation. His last documented atrial fibrillation episode was in October 2013. A nuclear perfusion study in October 2013 reveals normal perfusion imaging but he developed 1-2 mm of ST segment depression at peak stress test and the possibility of microvascular etiology leading to his ECG abnormalities. A subsequent cardiopulmonary that test was done on 09/17/2012. His peak maximum oxygen consumption was reduced at 58%. He had a suboptimal peak cardiovascular stress load making cardiovascular status interpretation indeterminate. He did have mild ventilation/perfusion mismatch suggesting impaired ulnar circulation plus minus increased dead space. He had a blunted chronotropic response to exercise. The test was limited by leg fatigue. When I saw him in the office subsequently I reduced his Cardizem from 240-180 mg. At times, he notes a rare palpitation. He denies any awareness of breakthrough atrial fibrillation.   Since I last saw him, he apparently presented to the emergency room the beginning of November with some vague chest pain. In retrospect, he feels this may have been GERD symptoms. Cardiac enzymes were negative. ECG was without changes. However, he subsequently saw Tanner Boozer, NP. He is referred for nuclear perfusion study which was done on 04/22/2013. This was low risk and showed mild diaphragmatic attenuation but did not show a region of scar or ischemia, unchanged from his prior study of several years previously.  Tanner Ho denies any recurrent episodes of palpitations. At times he does note rare dizziness. Apparently, he had developed breakthrough AF wall on a dose of 100 mg of amiodarone which led to subsequent dose increase to 200 mg. He also is on rate control medication with Cardizem CD 180 mg in addition to metoprolol succinate.    Past Medical History  Diagnosis Date  . Hypertension   . GERD (gastroesophageal reflux disease)   . MVP (mitral valve prolapse)     on cardiac cath  . Hyperlipidemia LDL goal < 100   . PAF (paroxysmal atrial fibrillation)     last episode 03/2012  . Chronic anticoagulation     on coumadin followed by Tanner Evener, PhD  . H/O cardiovascular stress test 03/2012    EKG with 1-2 mm ST segment depression but normal perfusion images. Followup metastases test reduced exercise effort and functional status and determinate for myocardial dysfunction    Past Surgical History  Procedure Laterality Date  . 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 Ciprofloxacin Other (See Comments)    Cannot take due to currently taking Amiodarone  . Hytrin [Terazosin] Other (See Comments)    Syncopal event     Current Outpatient Prescriptions  Medication Sig Dispense Refill  . acetaminophen (TYLENOL) 500 MG tablet Take 500 mg by mouth every 6 (six) hours as needed for moderate pain.      Marland Kitchen alfuzosin (UROXATRAL) 10 MG 24 hr tablet Take 10 mg by mouth daily.      Marland Kitchen amiodarone (PACERONE) 200 MG tablet Take 200 mg  by mouth daily.      . Calcium Citrate (CITRACAL PO) Take 1 capsule by mouth.      . Cholecalciferol (VITAMIN D-3) 1000 UNITS CAPS Take 1 capsule by mouth 2 (two) times daily.      Marland Kitchen diltiazem (CARDIZEM CD) 180 MG 24 hr capsule       . docusate sodium (COLACE) 100 MG capsule Take 100 mg by mouth daily.       . finasteride (PROSCAR) 5 MG tablet Take 5 mg by  mouth daily.      . fish oil-omega-3 fatty acids 1000 MG capsule Take 1 g by mouth 2 (two) times daily.      Marland Kitchen FLOVENT HFA 110 MCG/ACT inhaler 1 puff in he am and 1 puff in the pm      . hydrochlorothiazide (MICROZIDE) 12.5 MG capsule TAKE ONE CAPSULE BY MOUTH EVERY DAY  90 capsule  3  . levothyroxine (SYNTHROID, LEVOTHROID) 25 MCG tablet Take 25 mcg by mouth daily. Take at least 60 minutes before breakfast      . losartan (COZAAR) 50 MG tablet Take 50 mg by mouth 2 (two) times daily.       . magnesium hydroxide (MILK OF MAGNESIA) 800 MG/5ML suspension Take 5 mLs by mouth daily as needed for constipation.      . metoprolol succinate (TOPROL-XL) 25 MG 24 hr tablet Take 12.5 mg by mouth daily.      . Multiple Vitamins-Minerals (PRESERVISION AREDS) CAPS Take 1 capsule by mouth 2 (two) times daily. VITAMIN A FREE      . OVER THE COUNTER MEDICATION Apply 1 application topically daily.      Tanner Ho Glycol-Propyl Glycol (SYSTANE) 0.4-0.3 % SOLN Apply 2 drops to eye daily as needed (for dry eeys).      . polyethylene glycol (MIRALAX / GLYCOLAX) packet Take 17 g by mouth daily.      . rosuvastatin (CRESTOR) 5 MG tablet Take 5 mg by mouth daily at 6 PM.       . warfarin (COUMADIN) 5 MG tablet Take 2.5-5 mg by mouth daily. Takes 2.5mg  on thursdays Takes 5mg  all other days       No current facility-administered medications for this visit.    History   Social History  . Marital Status: Married    Spouse Name: N/A    Number of Children: N/A  . Years of Education: N/A   Occupational History  . Not on file.   Social History Main Topics  . Smoking status: Never Smoker   . Smokeless tobacco: Not on file  . Alcohol Use: No  . Drug Use: Not on file  . Sexual Activity: Not on file   Other Topics Concern  . Not on file   Social History Narrative  . No narrative on file    Social history is notable in that he is married. 2 children 4 grandchildren. No tobacco alcohol use.  ROS is negative  for fevers, chills or night sweats. He denies skin rash He denies visual symptoms. He denies change in hearing. There is no lymphadenopathy. There is no chest pain. There is some mild shortness of breath with activity. He is an occasional to rare palpitation. There is no syncope. He denies abdominal pain. He denies bleeding. At times he does note some mild imbalance. Also does note some rare swelling of his ankles bilaterally. He had stopped taking his hydrochlorothiazide which he was taking infrequently. He denies sleep issues. He denies  musculoskeletal issues. Other comprehensive 12 point system review is negative.  PE BP 130/70  Ht 5\' 9"  (1.753 m)  Wt 156 lb 11.2 oz (71.079 kg)  BMI 23.13 kg/m2 Repeat blood pressure by me was 142/70 supine and standing position blood pressure is 130/70 without significant increase orthostatic pulse rise. General: Alert, oriented, no distress.  Skin: normal turgor, no rashes HEENT: Normocephalic, atraumatic. Pupils round and reactive; sclera anicteric;no lid lag.  Nose without nasal septal hypertrophy Mouth/Parynx benign; Mallinpatti scale 3 Neck: No JVD, no carotid bruitts No chest wall tenderness to palpation Lungs: clear to ausculatation and percussion; no wheezing or rales Heart: RRR, s1 s2 normal there were occasional ectopic complex Abdomen: soft, nontender; no hepatosplenomehaly, BS+; abdominal aorta nontender and not dilated by palpation. Back: No CVA tenderness Pulses 2+ Extremities: Trace ankle edema, no clubbing cyanosis, Homan's sign negative  Neurologic: grossly nonfocal Psychological: Normal cognitive function, normal affect and mood  ECG: Sinus bradycardia at 50 beats per minute. No ectopy on ECG. Intervals normal  LABS:  BMET    Component Value Date/Time   NA 142 04/14/2013 2212   K 4.5 04/14/2013 2212   CL 105 04/14/2013 2212   CO2 27 04/14/2013 2212   GLUCOSE 128* 04/14/2013 2212   BUN 16 04/14/2013 2212   CREATININE 1.26  04/14/2013 2212   CALCIUM 9.1 04/14/2013 2212   GFRNONAA 55* 04/14/2013 2212   GFRAA 64* 04/14/2013 2212     Hepatic Function Panel  No results found for this basename: prot,  albumin,  ast,  alt,  alkphos,  bilitot,  bilidir,  ibili     CBC    Component Value Date/Time   WBC 4.2 04/14/2013 2212   RBC 3.93* 04/14/2013 2212   HGB 13.7 04/14/2013 2212   HCT 40.1 04/14/2013 2212   PLT 135* 04/14/2013 2212   MCV 102.0* 04/14/2013 2212   MCH 34.9* 04/14/2013 2212   MCHC 34.2 04/14/2013 2212   RDW 13.7 04/14/2013 2212     BNP No results found for this basename: probnp    Lipid Panel  No results found for this basename: chol,  trig,  hdl,  cholhdl,  vldl,  ldlcalc     RADIOLOGY: No results found.    ASSESSMENT AND PLAN: [name] is maintaining sinus rhythm and is bradycardic with a pulse of 50. He recently had presented to: Emergency room with chest pain. Cardiac enzymes and ECG were unremarkable. His most recent nuclear perfusion study is unchanged from his prior study of several years previously and showed mild attenuation artifact without scar or ischemia. Presently, I am recommending he decrease his amiodarone to 200 mg alternating with 100 mg every other day such that he would take a 200 mg dose on even days and a 100 mg dose are not days. His blood pressure today is well controlled. He's not having bleeding issues. There are no signs of edema. I suspect his most recent emergency room evaluation was noncardiac chest pain and possibly GERD mediated. He is to contact me if he notices recurrent palpitations or arrhythmia heart is slightly reduced in amiodarone dose. However, as long as he remains stable, we'll see him in 6 months for cardiology reevaluation.  Lennette Bihari, MD, [facility]  05/17/2013 12:01 PM

## 2013-05-18 ENCOUNTER — Emergency Department (HOSPITAL_BASED_OUTPATIENT_CLINIC_OR_DEPARTMENT_OTHER)
Admission: EM | Admit: 2013-05-18 | Discharge: 2013-05-18 | Disposition: A | Discharge status: Home / Self Care | Payer: Medicare Other | Attending: Emergency Medicine | Admitting: Emergency Medicine

## 2013-05-18 ENCOUNTER — Emergency Department (HOSPITAL_BASED_OUTPATIENT_CLINIC_OR_DEPARTMENT_OTHER): Payer: Medicare Other

## 2013-05-18 ENCOUNTER — Encounter (HOSPITAL_BASED_OUTPATIENT_CLINIC_OR_DEPARTMENT_OTHER): Payer: Self-pay | Admitting: Emergency Medicine

## 2013-05-18 DIAGNOSIS — J209 Acute bronchitis, unspecified: Secondary | ICD-10-CM | POA: Diagnosis not present

## 2013-05-18 DIAGNOSIS — J40 Bronchitis, not specified as acute or chronic: Secondary | ICD-10-CM

## 2013-05-18 DIAGNOSIS — E785 Hyperlipidemia, unspecified: Secondary | ICD-10-CM | POA: Insufficient documentation

## 2013-05-18 DIAGNOSIS — D689 Coagulation defect, unspecified: Secondary | ICD-10-CM | POA: Diagnosis not present

## 2013-05-18 DIAGNOSIS — Z79899 Other long term (current) drug therapy: Secondary | ICD-10-CM | POA: Insufficient documentation

## 2013-05-18 DIAGNOSIS — Z7901 Long term (current) use of anticoagulants: Secondary | ICD-10-CM | POA: Diagnosis not present

## 2013-05-18 DIAGNOSIS — I4891 Unspecified atrial fibrillation: Secondary | ICD-10-CM | POA: Diagnosis not present

## 2013-05-18 DIAGNOSIS — J45909 Unspecified asthma, uncomplicated: Secondary | ICD-10-CM | POA: Diagnosis not present

## 2013-05-18 DIAGNOSIS — I10 Essential (primary) hypertension: Secondary | ICD-10-CM | POA: Insufficient documentation

## 2013-05-18 DIAGNOSIS — K219 Gastro-esophageal reflux disease without esophagitis: Secondary | ICD-10-CM | POA: Insufficient documentation

## 2013-05-18 DIAGNOSIS — I059 Rheumatic mitral valve disease, unspecified: Secondary | ICD-10-CM | POA: Diagnosis not present

## 2013-05-18 DIAGNOSIS — J42 Unspecified chronic bronchitis: Secondary | ICD-10-CM | POA: Diagnosis not present

## 2013-05-18 DIAGNOSIS — R0602 Shortness of breath: Secondary | ICD-10-CM | POA: Diagnosis not present

## 2013-05-18 MED ORDER — IPRATROPIUM BROMIDE 0.02 % IN SOLN
RESPIRATORY_TRACT | Status: AC
  Administered 2013-05-18: 0.5 mg via RESPIRATORY_TRACT
  Filled 2013-05-18: qty 2.5

## 2013-05-18 MED ORDER — IPRATROPIUM BROMIDE 0.02 % IN SOLN
0.5000 mg | Freq: Once | RESPIRATORY_TRACT | Status: AC
  Administered 2013-05-18: 0.5 mg via RESPIRATORY_TRACT

## 2013-05-18 MED ORDER — LEVALBUTEROL TARTRATE 45 MCG/ACT IN AERO
2.0000 | INHALATION_SPRAY | Freq: Once | RESPIRATORY_TRACT | Status: DC
  Filled 2013-05-18: qty 15

## 2013-05-18 MED ORDER — LEVALBUTEROL TARTRATE 45 MCG/ACT IN AERO: 1.0000 | INHALATION_SPRAY | RESPIRATORY_TRACT | Status: DC | PRN

## 2013-05-18 MED ORDER — ALBUTEROL SULFATE (5 MG/ML) 0.5% IN NEBU
2.5000 mg | INHALATION_SOLUTION | Freq: Once | RESPIRATORY_TRACT | Status: AC
  Administered 2013-05-18: 2.5 mg via RESPIRATORY_TRACT

## 2013-05-18 MED ORDER — DEXAMETHASONE SODIUM PHOSPHATE 10 MG/ML IJ SOLN
INTRAMUSCULAR | Status: AC
  Administered 2013-05-18: 10 mg
  Filled 2013-05-18: qty 1

## 2013-05-18 MED ORDER — DEXAMETHASONE 4 MG PO TABS: 10.0000 mg | ORAL_TABLET | Freq: Once | ORAL | Status: DC

## 2013-05-18 MED ORDER — ALBUTEROL SULFATE (5 MG/ML) 0.5% IN NEBU
INHALATION_SOLUTION | RESPIRATORY_TRACT | Status: AC
  Administered 2013-05-18: 2.5 mg via RESPIRATORY_TRACT
  Filled 2013-05-18: qty 0.5

## 2013-05-18 NOTE — ED Notes (Signed)
Pt c/o wheeing,  Some facial tightness and dry cough x 2 days

## 2013-05-18 NOTE — ED Provider Notes (Signed)
CSN: [id number]     Arrival date & time 05/18/13  0023 History   First MD Initiated Contact with Patient 05/18/13 [phone]     Chief Complaint  Patient presents with  . Wheezing   (Consider location/radiation/quality/duration/timing/severity/associated sxs/prior Treatment) HPI This is a 72 year old man with acute onset of wheezing, coughing and  shortness of breath yesterday evening at bedtime. Symptoms are moderate in severity. He has been using his Flovent inhaler as prescribed. The symptoms are accompanied by chills but no fever, nausea, vomiting or diarrhea. He was evaluated by respiratory therapy prior to my evaluation and given an albuterol neb treatment with significant improvement. He is now comfortable and in no respiratory distress.  Past Medical History  Diagnosis Date  . Hypertension   . GERD (gastroesophageal reflux disease)   . MVP (mitral valve prolapse)     on cardiac cath  . Hyperlipidemia LDL goal < 100   . PAF (paroxysmal atrial fibrillation)     last episode 03/2012  . Chronic anticoagulation     on coumadin followed by Virgina Evener, PhD  . H/O cardiovascular stress test 03/2012    EKG with 1-2 mm ST segment depression but normal perfusion images. Followup metastases test reduced exercise effort and functional status and determinate for myocardial dysfunction   History reviewed. No pertinent past surgical history. Family History  Problem Relation Age of Onset  . Stroke Mother   . Stroke Paternal Grandfather    History  Substance Use Topics  . Smoking status: Never Smoker   . Smokeless tobacco: Not on file  . Alcohol Use: No    Review of Systems  All other systems reviewed and are negative.    Allergies  Ciprofloxacin and Hytrin  Home Medications   Current Outpatient Rx  Name  Route  Sig  Dispense  Refill  . acetaminophen (TYLENOL) 500 MG tablet   Oral   Take 500 mg by mouth every 6 (six) hours as needed for moderate pain.         Marland Kitchen alfuzosin  (UROXATRAL) 10 MG 24 hr tablet   Oral   Take 10 mg by mouth daily.         Marland Kitchen amiodarone (PACERONE) 200 MG tablet      Take Amiodarone 200 mg one day 100 mg the next         . Calcium Citrate (CITRACAL PO)   Oral   Take 1 capsule by mouth.         . Cholecalciferol (VITAMIN D-3) 1000 UNITS CAPS   Oral   Take 1 capsule by mouth 2 (two) times daily.         Marland Kitchen diltiazem (CARDIZEM CD) 180 MG 24 hr capsule               . docusate sodium (COLACE) 100 MG capsule   Oral   Take 100 mg by mouth daily.          . finasteride (PROSCAR) 5 MG tablet   Oral   Take 5 mg by mouth daily.         . fish oil-omega-3 fatty acids 1000 MG capsule   Oral   Take 1 g by mouth 2 (two) times daily.         Marland Kitchen FLOVENT HFA 110 MCG/ACT inhaler      1 puff in he am and 1 puff in the pm         . hydrochlorothiazide (MICROZIDE) 12.5 MG capsule  TAKE ONE CAPSULE BY MOUTH EVERY DAY   90 capsule   3   . levothyroxine (SYNTHROID, LEVOTHROID) 25 MCG tablet   Oral   Take 25 mcg by mouth daily. Take at least 60 minutes before breakfast         . losartan (COZAAR) 50 MG tablet   Oral   Take 50 mg by mouth 2 (two) times daily.          . magnesium hydroxide (MILK OF MAGNESIA) 800 MG/5ML suspension   Oral   Take 5 mLs by mouth daily as needed for constipation.         . metoprolol succinate (TOPROL-XL) 25 MG 24 hr tablet   Oral   Take 12.5 mg by mouth daily.         . Multiple Vitamins-Minerals (PRESERVISION AREDS) CAPS   Oral   Take 1 capsule by mouth 2 (two) times daily. VITAMIN A FREE         . OVER THE COUNTER MEDICATION   Apply externally   Apply 1 application topically daily.         Bertram Gala Glycol-Propyl Glycol (SYSTANE) 0.4-0.3 % SOLN   Ophthalmic   Apply 2 drops to eye daily as needed (for dry eeys).         . polyethylene glycol (MIRALAX / GLYCOLAX) packet   Oral   Take 17 g by mouth daily.         . rosuvastatin (CRESTOR) 5 MG  tablet   Oral   Take 5 mg by mouth daily at 6 PM.          . warfarin (COUMADIN) 5 MG tablet   Oral   Take 2.5-5 mg by mouth daily. Takes 2.5mg  on thursdays Takes 5mg  all other days          BP 147/61  Temp(Src) 98.4 F (36.9 C) (Oral)  Resp 16  SpO2 97%  Physical Exam General: Well-developed, well-nourished male in no acute distress; appearance consistent with age of record HENT: normocephalic; atraumatic Eyes: pupils equal, round and reactive to light; extraocular muscles intact Neck: supple Heart: regular rate and rhythm Lungs: Expiratory wheezing in the bases; no tachypnea; no accessory muscle use Abdomen: soft; nondistended; nontender; bowel sounds present Extremities: No deformity; full range of motion; pulses normal Neurologic: Awake, alert and oriented; motor function intact in all extremities and symmetric; no facial droop Skin: Warm and dry Psychiatric: Normal mood and affect    ED Course  Procedures (including critical care time)  MDM  Nursing notes and vitals signs, including pulse oximetry, reviewed.  Summary of this visit's results, reviewed by myself:  Labs:  No results found for this or any previous visit (from the past 24 hour(s)).  Imaging Studies: Dg Chest 2 View  05/18/2013   CLINICAL DATA:  Shortness of breath, wheezing, history hypertension  EXAM: CHEST  2 VIEW  COMPARISON:  04/14/2013  FINDINGS: Mild enlargement of cardiac silhouette.  Mediastinal contours and pulmonary vascularity normal.  Chronic peribronchial thickening.  No pulmonary infiltrate, pleural effusion or pneumothorax.  No acute osseous findings.  IMPRESSION: Chronic bronchitic changes.   Electronically Signed   By: Ulyses Southward M.D.   On: 05/18/2013 01:26   2:04 AM We'll administer a dose of steroids in the ED and did provide him a Xopenex inhaler. Xopenex is preferred over a year all as he does have a history of arrhythmia.      Hanley Seamen, MD 05/18/13 [phone]

## 2013-05-18 NOTE — ED Notes (Signed)
Pt sts wheezing, has used inhaler provided by PMD with some relief; but wheezing returned. +Chills today, denies fever. Denies n/v/d.

## 2013-05-20 ENCOUNTER — Telehealth: Payer: Self-pay | Admitting: Cardiovascular Disease

## 2013-05-20 NOTE — Telephone Encounter (Signed)
Pt had wheezing problem couple of days ago.  Seen in ER and was given Zofinex inhaler. Wants to know if it's ok for hiim to use it with his other medications?

## 2013-05-20 NOTE — Telephone Encounter (Signed)
RN consulted Dr. Allyson Sabal in regards to Xopenex and arrhythmias and informed it is recommended over albuterol.    Returned call and pt verified x 2.  Pt informed Xopenex is recommended over albuterol and is okay for him to take.  RN also advised pt to use a spacer if he has one to make sure he gets the maximum amt of medication and to try to hold inhale for 10 secs when taking Xopenex.  Pt verbalized understanding and agreed w/ plan.  Pt has a spacer at home and verbalized understanding for cleaning.

## 2013-05-25 DIAGNOSIS — Z7901 Long term (current) use of anticoagulants: Secondary | ICD-10-CM | POA: Diagnosis not present

## 2013-05-25 DIAGNOSIS — I4891 Unspecified atrial fibrillation: Secondary | ICD-10-CM | POA: Diagnosis not present

## 2013-06-02 DIAGNOSIS — D239 Other benign neoplasm of skin, unspecified: Secondary | ICD-10-CM | POA: Diagnosis not present

## 2013-06-02 DIAGNOSIS — L219 Seborrheic dermatitis, unspecified: Secondary | ICD-10-CM | POA: Diagnosis not present

## 2013-06-02 DIAGNOSIS — L821 Other seborrheic keratosis: Secondary | ICD-10-CM | POA: Diagnosis not present

## 2013-06-02 DIAGNOSIS — L820 Inflamed seborrheic keratosis: Secondary | ICD-10-CM | POA: Diagnosis not present

## 2013-06-02 DIAGNOSIS — Z85828 Personal history of other malignant neoplasm of skin: Secondary | ICD-10-CM | POA: Diagnosis not present

## 2013-06-02 DIAGNOSIS — R238 Other skin changes: Secondary | ICD-10-CM | POA: Diagnosis not present

## 2013-06-07 DIAGNOSIS — K219 Gastro-esophageal reflux disease without esophagitis: Secondary | ICD-10-CM | POA: Diagnosis not present

## 2013-06-07 DIAGNOSIS — I10 Essential (primary) hypertension: Secondary | ICD-10-CM | POA: Diagnosis not present

## 2013-06-07 DIAGNOSIS — J45909 Unspecified asthma, uncomplicated: Secondary | ICD-10-CM | POA: Diagnosis not present

## 2013-06-07 DIAGNOSIS — L719 Rosacea, unspecified: Secondary | ICD-10-CM | POA: Diagnosis not present

## 2013-06-14 ENCOUNTER — Other Ambulatory Visit: Payer: Self-pay | Admitting: Cardiovascular Disease

## 2013-06-14 NOTE — Telephone Encounter (Signed)
Rx was sent to pharmacy electronically. 

## 2013-06-21 ENCOUNTER — Other Ambulatory Visit: Payer: Self-pay | Admitting: Cardiovascular Disease

## 2013-06-21 NOTE — Telephone Encounter (Signed)
Rx was sent to pharmacy electronically. 

## 2013-06-22 DIAGNOSIS — I4891 Unspecified atrial fibrillation: Secondary | ICD-10-CM | POA: Diagnosis not present

## 2013-06-22 DIAGNOSIS — I10 Essential (primary) hypertension: Secondary | ICD-10-CM | POA: Diagnosis not present

## 2013-06-22 DIAGNOSIS — Z7901 Long term (current) use of anticoagulants: Secondary | ICD-10-CM | POA: Diagnosis not present

## 2013-07-19 IMAGING — CR DG CHEST 2V
2 series · 2 of 2 positions shown · non-contrast
Comparison: None.

CLINICAL DATA: 71-year-old male with palpitations.

CHEST - 2 VIEW

[w chest pa]
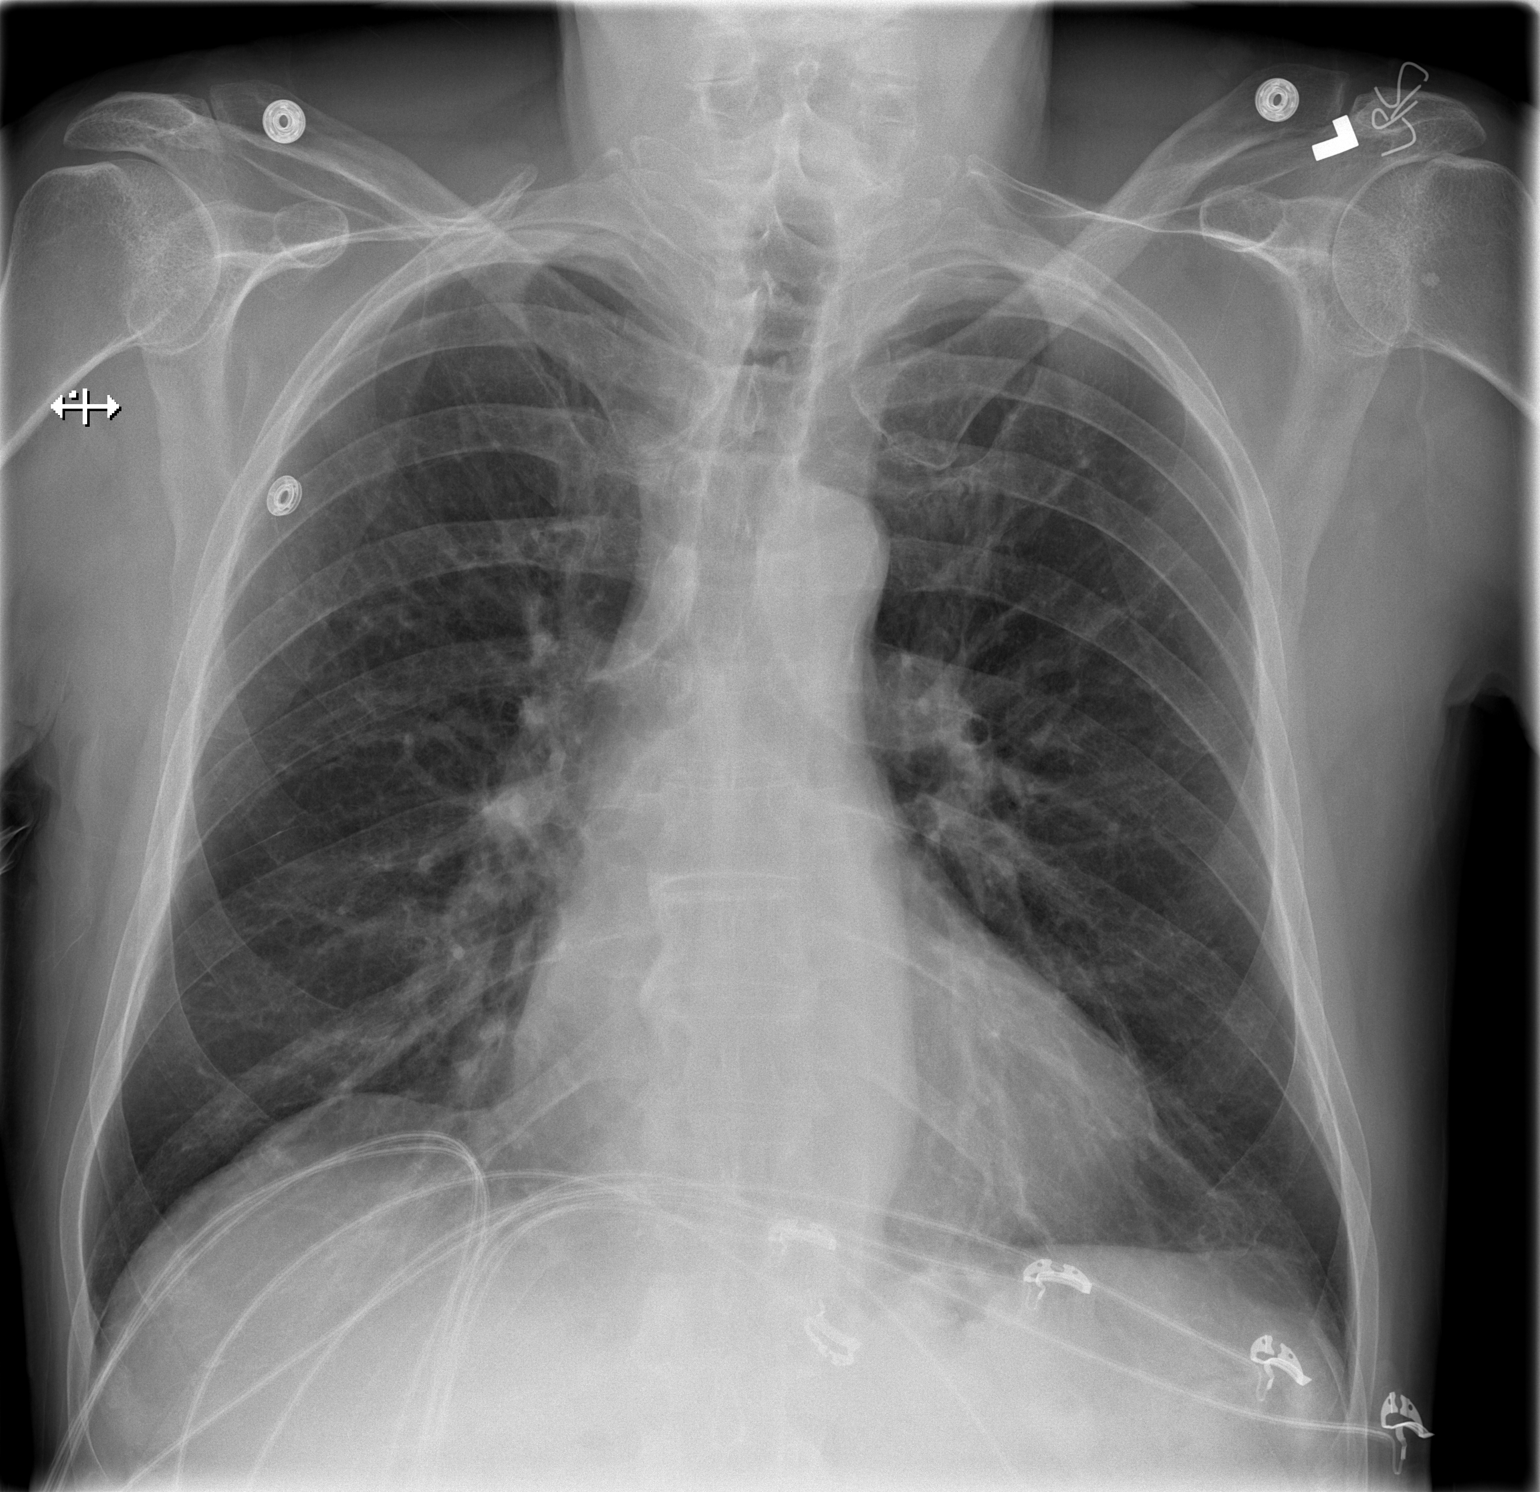

[w chest lat]
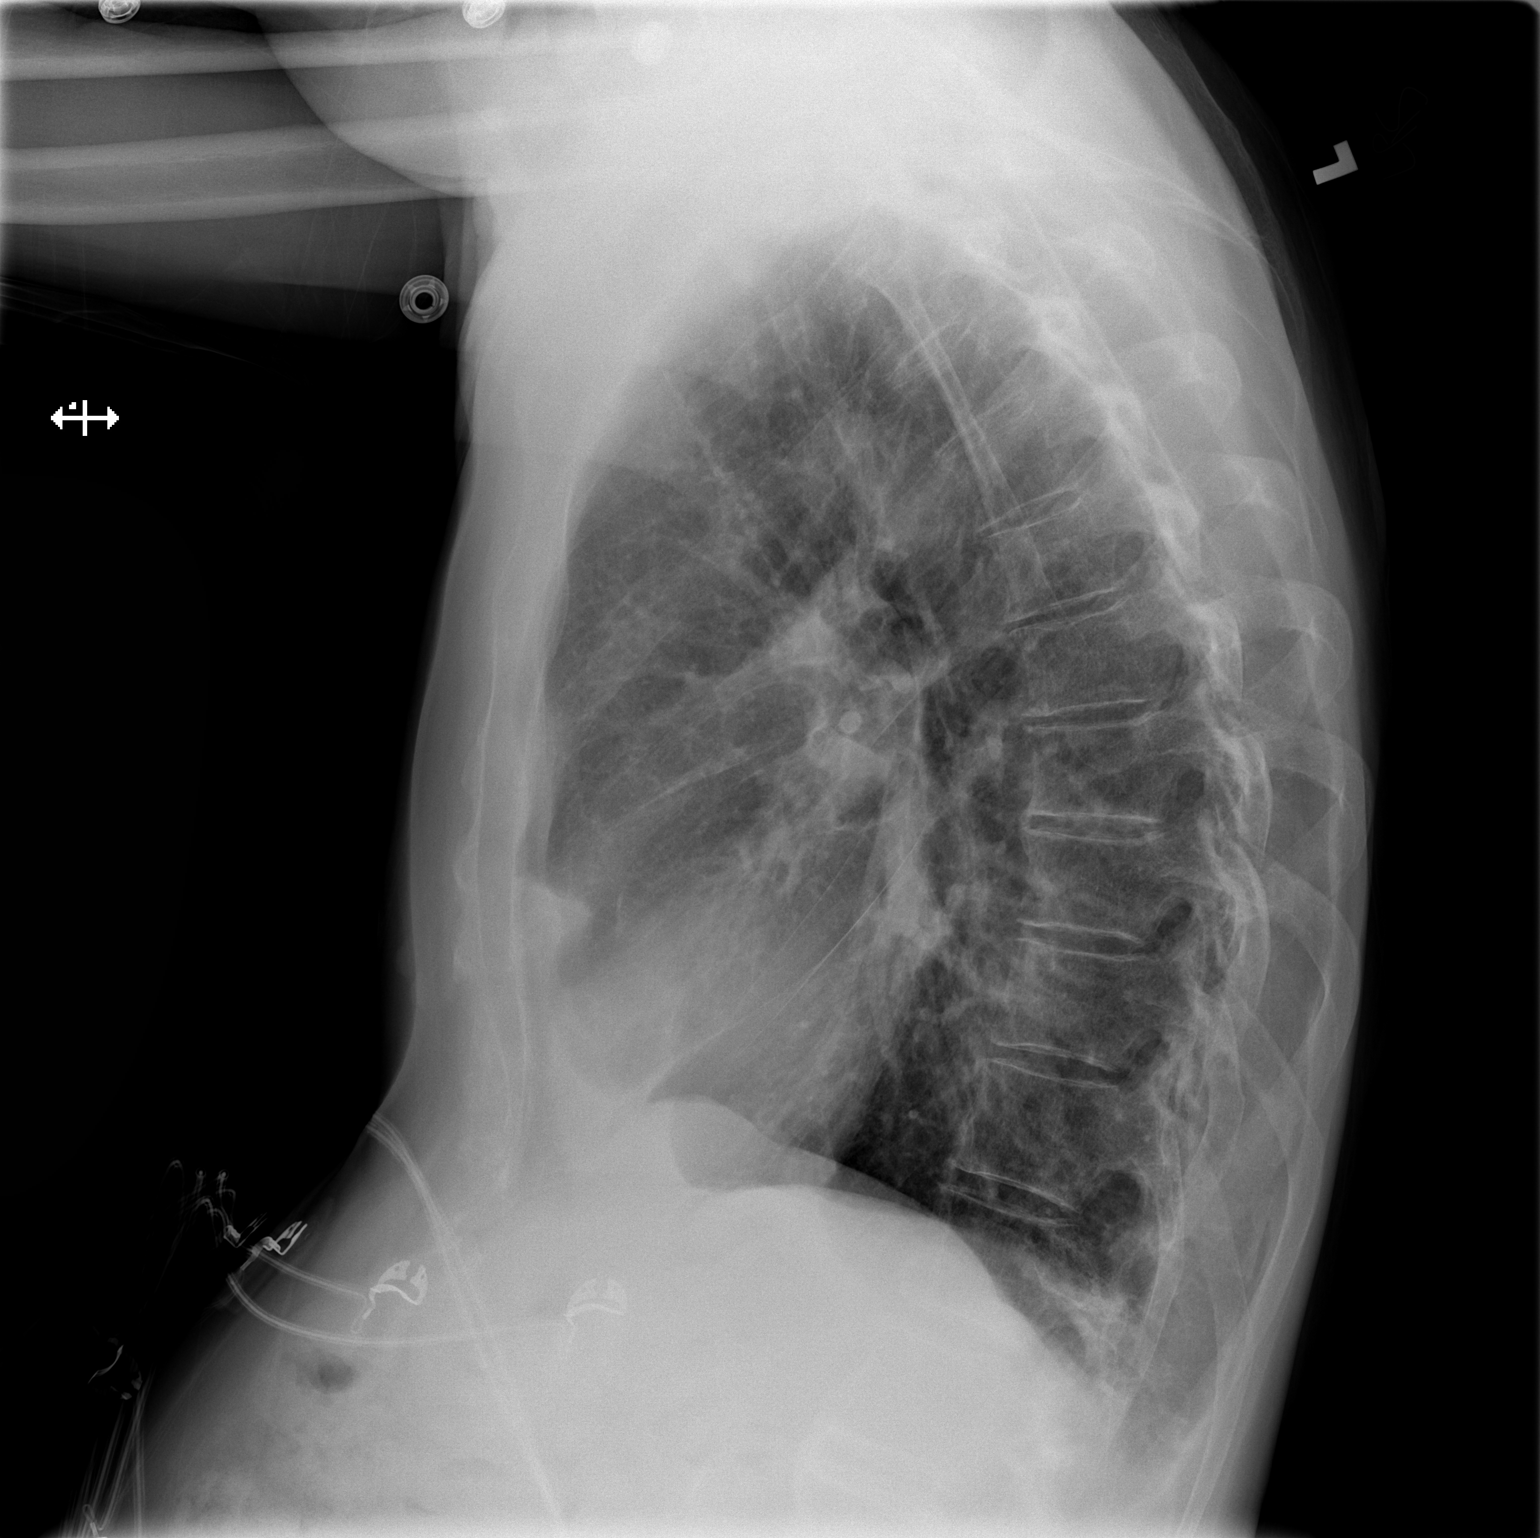

[2 of 2 positions shown; findings below may reference images not displayed]

FINDINGS: Cardiac size at the upper limits of normal. Other
mediastinal contours are within normal limits.  Visualized tracheal
air column is within normal limits.  Large lung volumes.  No
pneumothorax or pulmonary edema.  No pleural effusion or
consolidation. Scattered pulmonary scarring suspected.  No acute
osseous abnormality identified.
IMPRESSION: No acute cardiopulmonary abnormality.

## 2013-07-22 DIAGNOSIS — Z7901 Long term (current) use of anticoagulants: Secondary | ICD-10-CM | POA: Diagnosis not present

## 2013-07-22 DIAGNOSIS — I4891 Unspecified atrial fibrillation: Secondary | ICD-10-CM | POA: Diagnosis not present

## 2013-07-26 DIAGNOSIS — H33309 Unspecified retinal break, unspecified eye: Secondary | ICD-10-CM | POA: Diagnosis not present

## 2013-07-26 DIAGNOSIS — H43819 Vitreous degeneration, unspecified eye: Secondary | ICD-10-CM | POA: Diagnosis not present

## 2013-07-26 DIAGNOSIS — H353190 Nonexudative age-related macular degeneration, unspecified eye, stage unspecified: Secondary | ICD-10-CM | POA: Diagnosis not present

## 2013-07-26 DIAGNOSIS — H31009 Unspecified chorioretinal scars, unspecified eye: Secondary | ICD-10-CM | POA: Diagnosis not present

## 2013-07-26 DIAGNOSIS — H43399 Other vitreous opacities, unspecified eye: Secondary | ICD-10-CM | POA: Diagnosis not present

## 2013-08-17 ENCOUNTER — Other Ambulatory Visit: Payer: Self-pay | Admitting: *Deleted

## 2013-08-17 MED ORDER — LOSARTAN POTASSIUM 50 MG PO TABS: 50.0000 mg | ORAL_TABLET | Freq: Two times a day (BID) | ORAL | Status: DC

## 2013-08-19 DIAGNOSIS — I4891 Unspecified atrial fibrillation: Secondary | ICD-10-CM | POA: Diagnosis not present

## 2013-08-19 DIAGNOSIS — I10 Essential (primary) hypertension: Secondary | ICD-10-CM | POA: Diagnosis not present

## 2013-08-19 DIAGNOSIS — Z7901 Long term (current) use of anticoagulants: Secondary | ICD-10-CM | POA: Diagnosis not present

## 2013-08-24 DIAGNOSIS — H26499 Other secondary cataract, unspecified eye: Secondary | ICD-10-CM | POA: Diagnosis not present

## 2013-08-24 DIAGNOSIS — H542X22 Low vision right eye category 2, low vision left eye category 2: Secondary | ICD-10-CM | POA: Diagnosis not present

## 2013-08-24 DIAGNOSIS — H353290 Exudative age-related macular degeneration, unspecified eye, stage unspecified: Secondary | ICD-10-CM | POA: Diagnosis not present

## 2013-09-07 DIAGNOSIS — L570 Actinic keratosis: Secondary | ICD-10-CM | POA: Diagnosis not present

## 2013-09-07 DIAGNOSIS — L821 Other seborrheic keratosis: Secondary | ICD-10-CM | POA: Diagnosis not present

## 2013-09-16 DIAGNOSIS — I4891 Unspecified atrial fibrillation: Secondary | ICD-10-CM | POA: Diagnosis not present

## 2013-09-16 DIAGNOSIS — Z7901 Long term (current) use of anticoagulants: Secondary | ICD-10-CM | POA: Diagnosis not present

## 2013-10-12 DIAGNOSIS — H26499 Other secondary cataract, unspecified eye: Secondary | ICD-10-CM | POA: Diagnosis not present

## 2013-10-12 DIAGNOSIS — H353190 Nonexudative age-related macular degeneration, unspecified eye, stage unspecified: Secondary | ICD-10-CM | POA: Diagnosis not present

## 2013-10-14 DIAGNOSIS — Z7901 Long term (current) use of anticoagulants: Secondary | ICD-10-CM | POA: Diagnosis not present

## 2013-10-14 DIAGNOSIS — I4891 Unspecified atrial fibrillation: Secondary | ICD-10-CM | POA: Diagnosis not present

## 2013-10-26 ENCOUNTER — Other Ambulatory Visit: Payer: Self-pay

## 2013-10-26 MED ORDER — AMIODARONE HCL 200 MG PO TABS: 200.0000 mg | ORAL_TABLET | Freq: Every day | ORAL | Status: DC

## 2013-10-26 NOTE — Telephone Encounter (Signed)
Rx was sent to pharmacy electronically. 

## 2013-11-01 DIAGNOSIS — Z961 Presence of intraocular lens: Secondary | ICD-10-CM | POA: Diagnosis not present

## 2013-11-01 DIAGNOSIS — H04129 Dry eye syndrome of unspecified lacrimal gland: Secondary | ICD-10-CM | POA: Diagnosis not present

## 2013-11-01 DIAGNOSIS — H2640 Unspecified secondary cataract: Secondary | ICD-10-CM | POA: Diagnosis not present

## 2013-11-01 DIAGNOSIS — H3530 Unspecified macular degeneration: Secondary | ICD-10-CM | POA: Diagnosis not present

## 2013-11-11 DIAGNOSIS — Z7901 Long term (current) use of anticoagulants: Secondary | ICD-10-CM | POA: Diagnosis not present

## 2013-11-11 DIAGNOSIS — I4891 Unspecified atrial fibrillation: Secondary | ICD-10-CM | POA: Diagnosis not present

## 2013-11-11 DIAGNOSIS — I10 Essential (primary) hypertension: Secondary | ICD-10-CM | POA: Diagnosis not present

## 2013-11-15 ENCOUNTER — Other Ambulatory Visit: Payer: Self-pay | Admitting: *Deleted

## 2013-11-15 MED ORDER — LOSARTAN POTASSIUM 50 MG PO TABS: 50.0000 mg | ORAL_TABLET | Freq: Two times a day (BID) | ORAL | Status: DC

## 2013-11-15 NOTE — Telephone Encounter (Signed)
Rx was sent to pharmacy electronically. 

## 2013-11-25 DIAGNOSIS — H2640 Unspecified secondary cataract: Secondary | ICD-10-CM | POA: Diagnosis not present

## 2013-11-25 DIAGNOSIS — H26499 Other secondary cataract, unspecified eye: Secondary | ICD-10-CM | POA: Diagnosis not present

## 2013-12-06 DIAGNOSIS — L570 Actinic keratosis: Secondary | ICD-10-CM | POA: Diagnosis not present

## 2013-12-06 DIAGNOSIS — J019 Acute sinusitis, unspecified: Secondary | ICD-10-CM | POA: Diagnosis not present

## 2013-12-06 DIAGNOSIS — Z85828 Personal history of other malignant neoplasm of skin: Secondary | ICD-10-CM | POA: Diagnosis not present

## 2013-12-06 DIAGNOSIS — L821 Other seborrheic keratosis: Secondary | ICD-10-CM | POA: Diagnosis not present

## 2013-12-13 DIAGNOSIS — I10 Essential (primary) hypertension: Secondary | ICD-10-CM | POA: Diagnosis not present

## 2013-12-13 DIAGNOSIS — Z7901 Long term (current) use of anticoagulants: Secondary | ICD-10-CM | POA: Diagnosis not present

## 2013-12-13 DIAGNOSIS — E039 Hypothyroidism, unspecified: Secondary | ICD-10-CM | POA: Diagnosis not present

## 2013-12-13 DIAGNOSIS — I4891 Unspecified atrial fibrillation: Secondary | ICD-10-CM | POA: Diagnosis not present

## 2013-12-28 DIAGNOSIS — H353290 Exudative age-related macular degeneration, unspecified eye, stage unspecified: Secondary | ICD-10-CM | POA: Diagnosis not present

## 2013-12-28 DIAGNOSIS — H542X22 Low vision right eye category 2, low vision left eye category 2: Secondary | ICD-10-CM | POA: Diagnosis not present

## 2014-01-04 DIAGNOSIS — Z7901 Long term (current) use of anticoagulants: Secondary | ICD-10-CM | POA: Diagnosis not present

## 2014-01-04 DIAGNOSIS — I498 Other specified cardiac arrhythmias: Secondary | ICD-10-CM | POA: Diagnosis not present

## 2014-01-04 DIAGNOSIS — I4891 Unspecified atrial fibrillation: Secondary | ICD-10-CM | POA: Diagnosis not present

## 2014-01-05 ENCOUNTER — Encounter: Payer: Self-pay | Admitting: Cardiology

## 2014-01-05 ENCOUNTER — Ambulatory Visit (INDEPENDENT_AMBULATORY_CARE_PROVIDER_SITE_OTHER): Payer: Medicare Other | Admitting: Cardiology

## 2014-01-05 VITALS — BP 128/50 | HR 52 | Ht 68.5 in | Wt 154.9 lb

## 2014-01-05 DIAGNOSIS — Z79899 Other long term (current) drug therapy: Secondary | ICD-10-CM | POA: Diagnosis not present

## 2014-01-05 DIAGNOSIS — R42 Dizziness and giddiness: Secondary | ICD-10-CM

## 2014-01-05 DIAGNOSIS — I498 Other specified cardiac arrhythmias: Secondary | ICD-10-CM

## 2014-01-05 DIAGNOSIS — I4891 Unspecified atrial fibrillation: Secondary | ICD-10-CM | POA: Diagnosis not present

## 2014-01-05 DIAGNOSIS — R001 Bradycardia, unspecified: Secondary | ICD-10-CM

## 2014-01-05 DIAGNOSIS — I10 Essential (primary) hypertension: Secondary | ICD-10-CM

## 2014-01-05 DIAGNOSIS — Z0389 Encounter for observation for other suspected diseases and conditions ruled out: Secondary | ICD-10-CM | POA: Diagnosis not present

## 2014-01-05 DIAGNOSIS — Z7901 Long term (current) use of anticoagulants: Secondary | ICD-10-CM

## 2014-01-05 DIAGNOSIS — IMO0001 Reserved for inherently not codable concepts without codable children: Secondary | ICD-10-CM

## 2014-01-05 LAB — COMPREHENSIVE METABOLIC PANEL
ALBUMIN: 4.4 g/dL (ref 3.5–5.2)
ALK PHOS: 107 U/L (ref 39–117)
ALT: 15 U/L (ref 0–53)
AST: 15 U/L (ref 0–37)
BUN: 22 mg/dL (ref 6–23)
CO2: 28 mEq/L (ref 19–32)
CREATININE: 1.46 mg/dL — AB (ref 0.50–1.35)
Calcium: 9.4 mg/dL (ref 8.4–10.5)
Chloride: 105 mEq/L (ref 96–112)
Glucose, Bld: 98 mg/dL (ref 70–99)
POTASSIUM: 4.6 meq/L (ref 3.5–5.3)
Sodium: 141 mEq/L (ref 135–145)
Total Bilirubin: 0.4 mg/dL (ref 0.2–1.2)
Total Protein: 6.7 g/dL (ref 6.0–8.3)

## 2014-01-05 LAB — CBC
HEMATOCRIT: 39.7 % (ref 39.0–52.0)
HEMOGLOBIN: 13.9 g/dL (ref 13.0–17.0)
MCH: 34.4 pg — AB (ref 26.0–34.0)
MCHC: 35 g/dL (ref 30.0–36.0)
MCV: 98.3 fL (ref 78.0–100.0)
Platelets: 155 10*3/uL (ref 150–400)
RBC: 4.04 MIL/uL — ABNORMAL LOW (ref 4.22–5.81)
RDW: 13.6 % (ref 11.5–15.5)
WBC: 5.2 10*3/uL (ref 4.0–10.5)

## 2014-01-05 NOTE — Patient Instructions (Addendum)
Your physician recommends that you schedule a follow-up appointment Keep appointment with Dr Claiborne Billings in October, we will call you if you need to be seen sooner.  Your physician has recommended you make the following change in your medication: STOP taking Diltiazem Check Losartan medication if you are taking 50 mg. Please take 25 mg twice daily. If you are taking 25 mg twice a day please take 25 mg daily.  Your physician recommends that you return for lab work CMP, CBC  Your physician has recommended that you wear an event monitor. Event monitors are medical devices that record the heart's electrical activity. Doctors most often [place] these monitors to diagnose arrhythmias. Arrhythmias are problems with the speed or rhythm of the heartbeat. The monitor is a small, portable device. You can wear one while you do your normal daily activities. This is usually used to diagnose what is causing palpitations/syncope (passing out). 2 Weeks

## 2014-01-05 NOTE — Progress Notes (Signed)
01/09/2014   PCP: Horatio Pel, MD   Chief Complaint  Patient presents with  . Follow-up    Pt c/o dizziness, have no energy, denied SOB and chest pain    Primary Cardiologist:Dr. Corky Downs   HPI:  73 year old gentleman with a history of mitral valve prolapse, hypertension, hyperlipidemia, hypothyroidism, as well as paroxysmal atrial fibrillation. He is on chronic Coumadin anticoagulation. His last documented atrial fibrillation episode was in October 2013. A nuclear perfusion study in October 2013 reveals normal perfusion imaging but he developed 1-2 mm of ST segment depression at peak stress test and the possibility of microvascular etiology leading to his ECG abnormalities. A subsequent cardiopulmonary that test was done on 09/17/2012. His peak maximum oxygen consumption was reduced at 58%. He had a suboptimal peak cardiovascular stress load making cardiovascular status interpretation indeterminate. He did have mild ventilation/perfusion mismatch suggesting impaired ulnar circulation plus minus increased dead space. He had a blunted chronotropic response to exercise. The test was limited by leg fatigue. When Dr. Corky Downs  saw him in the office subsequently he reduced his Cardizem from 240-180 mg. At times, he noted a rare palpitation.    He presents today with dizziness no energy difficulty walking he can hardly move his legs he said fatigue.  He denies any awareness of breakthrough atrial fibrillation.    His bradycardic here in the office there he has been bradycardic in the past.  No chest pain no shortness of breath. Please that at times his heart rate is 46 when he checks it at home.  He is also had episodes of dizziness.  At times his blood pressure is also low at home.     Allergies  Allergen Reactions  . Ciprofloxacin Other (See Comments)    Cannot take due to currently taking Amiodarone  . Hytrin [Terazosin] Other (See Comments)    Syncopal event      Current Outpatient Prescriptions  Medication Sig Dispense Refill  . acetaminophen (TYLENOL) 500 MG tablet Take 500 mg by mouth every 6 (six) hours as needed for moderate pain.      Marland Kitchen alfuzosin (UROXATRAL) 10 MG 24 hr tablet Take 10 mg by mouth daily.      Marland Kitchen amiodarone (PACERONE) 200 MG tablet 200 mg on evens days, 100 mg on odds days      . Calcium Citrate (CITRACAL PO) Take 2 capsules by mouth.       . Cholecalciferol (VITAMIN D-3) 1000 UNITS CAPS Take 1 capsule by mouth 2 (two) times daily.      . finasteride (PROSCAR) 5 MG tablet Take 5 mg by mouth daily.      . fish oil-omega-3 fatty acids 1000 MG capsule Take 1 g by mouth 2 (two) times daily.      Marland Kitchen FLOVENT HFA 110 MCG/ACT inhaler 1 puff in he am and 1 puff in the pm      . levalbuterol (XOPENEX HFA) 45 MCG/ACT inhaler Inhale 1 puff into the lungs every 4 (four) hours as needed for wheezing.  1 Inhaler  12  . levothyroxine (SYNTHROID, LEVOTHROID) 25 MCG tablet Take 50 mcg by mouth daily. Take at least 60 minutes before breakfast      . losartan (COZAAR) 50 MG tablet Take 1 tablet (50 mg total) by mouth 2 (two) times daily.  180 tablet  1  . magnesium hydroxide (MILK OF MAGNESIA) 800 MG/5ML suspension Take 5 mLs by mouth daily as  needed for constipation.      . metoprolol succinate (TOPROL-XL) 25 MG 24 hr tablet TAKE 1/2 TABLET DAILY.  45 tablet  3  . Multiple Vitamins-Minerals (PRESERVISION AREDS) CAPS Take 1 capsule by mouth 2 (two) times daily. VITAMIN A FREE      . OVER THE COUNTER MEDICATION Apply 1 application topically daily.      Vladimir Faster Glycol-Propyl Glycol (SYSTANE) 0.4-0.3 % SOLN Apply 2 drops to eye daily as needed (for dry eeys).      . polyethylene glycol (MIRALAX / GLYCOLAX) packet Take 17 g by mouth daily.      . rosuvastatin (CRESTOR) 5 MG tablet Take 5 mg by mouth daily at 6 PM.       . warfarin (COUMADIN) 5 MG tablet Take 2.5-5 mg by mouth daily. Takes 2.5mg  on thursdays Takes 5mg  all other days      . diltiazem  (CARDIZEM CD) 120 MG 24 hr capsule Take 1 capsule (120 mg total) by mouth daily.  30 capsule  6   No current facility-administered medications for this visit.    Past Medical History  Diagnosis Date  . Hypertension   . GERD (gastroesophageal reflux disease)   . MVP (mitral valve prolapse)     on cardiac cath  . Hyperlipidemia LDL goal < 100   . PAF (paroxysmal atrial fibrillation)     last episode 03/2012  . Chronic anticoagulation     on coumadin followed by [name], PhD  . H/O cardiovascular stress test 03/2012    EKG with 1-2 mm ST segment depression but normal perfusion images. Followup metastases test reduced exercise effort and functional status and determinate for myocardial dysfunction    History reviewed. No pertinent past surgical history.  DXI:PJASNKN:LZ colds or fevers, no weight changes Skin:no rashes or ulcers HEENT:no blurred vision, no congestion CV:see HPI PUL:see HPI GI:no diarrhea constipation or melena, no indigestion GU:no hematuria, no dysuria MS:no joint pain, no claudication Neuro:no syncope, no lightheadedness Endo:no diabetes, no thyroid disease  Wt Readings from Last 3 Encounters:  01/05/14 154 lb 14.4 oz (70.262 kg)  05/17/13 156 lb 11.2 oz (71.079 kg)  04/22/13 156 lb (70.761 kg)    PHYSICAL EXAM BP 128/50  Pulse 52  Ht 5' 8.5" (1.74 m)  Wt 154 lb 14.4 oz (70.262 kg)  BMI 23.21 kg/m2 General:Pleasant affect, NAD Skin:Warm and dry, brisk capillary refill HEENT:normocephalic, sclera clear, mucus membranes moist Neck:supple, no JVD, no bruits  Heart:S1S2 RRR without murmur, gallup, rub or click Lungs:clear without rales, rhonchi, or wheezes JQB:HALP, non tender, + BS, do not palpate liver spleen or masses Ext:no lower ext edema, 2+ pedal pulses, 2+ radial pulses Neuro:alert and oriented, MAE, follows commands, + facial symmetry EKG:S brady, rate of 52, nonspecific ST and T wave abnormality no acute changes.  ASSESSMENT AND  PLAN Dizziness Weakness associated with dizziness, as well. His heart rate at times is in the 40s blood pressure is also low. Recent labs from Dr. for [facility] was 6.09 medications were adjusted.   glucose 96 potassium 4.1 these were in October 2014.  With his bradycardia I have asked him to stop his diltiazem and with his hypotension at home to decrease losartan by half.  I'll have him wear the event monitor as well to see how slow his heart rate is.  We'll check basic metabolic panel and CBC today as well.  He'll keep his appointment in October with Dr. Claiborne Billings he may need to come in  sooner if there are abnormalities on his event monitor  Normal coronary arteries March 2000 No chest pain  HTN (hypertension) Male hypotensive per his home readings see above note  Chronic anticoagulation Stable and he denies any bleeding

## 2014-01-06 ENCOUNTER — Ambulatory Visit: Payer: Medicare Other | Admitting: Cardiology

## 2014-01-06 ENCOUNTER — Other Ambulatory Visit: Payer: Self-pay | Admitting: *Deleted

## 2014-01-06 MED ORDER — DILTIAZEM HCL ER COATED BEADS 120 MG PO CP24: 120.0000 mg | ORAL_CAPSULE | Freq: Every day | ORAL | Status: DC

## 2014-01-09 ENCOUNTER — Encounter: Payer: Self-pay | Admitting: Cardiology

## 2014-01-09 NOTE — Assessment & Plan Note (Signed)
Stable and he denies any bleeding

## 2014-01-09 NOTE — Assessment & Plan Note (Signed)
Male hypotensive per his home readings see above note

## 2014-01-09 NOTE — Assessment & Plan Note (Signed)
No chest pain

## 2014-01-09 NOTE — Assessment & Plan Note (Addendum)
Weakness associated with dizziness, as well. His heart rate at times is in the 40s blood pressure is also low. Recent labs from Dr. for [facility] was 6.09 medications were adjusted.   glucose 96 potassium 4.1 these were in October 2014.  With his bradycardia I have asked him to stop his diltiazem and with his hypotension at home to decrease losartan by half.  I'll have him wear the event monitor as well to see how slow his heart rate is.  We'll check basic metabolic panel and CBC today as well.  He'll keep his appointment in October with Dr. Claiborne Billings he may need to come in sooner if there are abnormalities on his event monitor

## 2014-01-25 ENCOUNTER — Other Ambulatory Visit: Payer: Self-pay | Admitting: *Deleted

## 2014-01-25 DIAGNOSIS — R001 Bradycardia, unspecified: Secondary | ICD-10-CM

## 2014-01-25 DIAGNOSIS — I498 Other specified cardiac arrhythmias: Secondary | ICD-10-CM | POA: Diagnosis not present

## 2014-01-28 ENCOUNTER — Telehealth: Payer: Self-pay | Admitting: Cardiovascular Disease

## 2014-01-28 NOTE — Telephone Encounter (Signed)
Spoke with Dr. Claiborne Billings. He reviewed last OV note from when patient saw Mickel Baas and patients BP/HR readings, was informed that event monitor showed SB for most part. He advised that patient stay on current medications until f/up Oct. 1st.   This information was communicated to patient. Advised that he continue to monitor his BP and HR and if his BP is consistently over 917 systolic to call our office, but if he has isolated incidences where BP is elevated, it is hard to make med adjustments, especially given that he has had normal BP readings and was in OV note that he reported some low BPs at home.   Patient voiced understanding.

## 2014-01-28 NOTE — Telephone Encounter (Signed)
Returned call to patient. He reports that he felt like he had an AF episode last nite - lasting about 1-2 hours. He reports he "didn't feel like my breathing was normal" and he was shaking and had chills. He reports he DID NOT feel like his HR sped up, but just felt different.   Patient saw Mickel Baas early August and wore a 2 week event monitor - showed mostly SB  Patient provided BP results (of which he was concerned that his BP has been trending upwards since Mickel Baas decreased losartan from 50mg  BID to 25mg  BID. Of note, at last OV diltiazem 180mg  was stopped r/t low HR but added back at dose of 120mg  daily (patient email documentation - concerns after OV on 8/5 of stopping diltiazem).   BP yesterday... 122/56... 147/61... 169/65 BP today... 137/57 HR 54  Patient denies chest pain, shortness of breath but does complain of lightheadedness, which could be r/t fluctuating BP and low HR  Patient has f/up with Dr. Claiborne Billings on 10/1 and asked questioned of nurse about a pacemaker, if would help with slow HR  Informed patient will be notified of his concerns: BP and AF -- and that he will be notified of advice.

## 2014-01-28 NOTE — Telephone Encounter (Signed)
Pt called in stating that he has been having some AFIB symptoms and would like to know if he could be seen today. He stated that his pulse was 48 last night and his BP was last recorded at 169/65 and he said he was feeling weak. He also wanted to know if his Losartan dosages could be increased to help him feel better and regulate his BP. Please call  Thanks

## 2014-02-03 DIAGNOSIS — I4891 Unspecified atrial fibrillation: Secondary | ICD-10-CM | POA: Diagnosis not present

## 2014-02-03 DIAGNOSIS — Z7901 Long term (current) use of anticoagulants: Secondary | ICD-10-CM | POA: Diagnosis not present

## 2014-02-03 DIAGNOSIS — I10 Essential (primary) hypertension: Secondary | ICD-10-CM | POA: Diagnosis not present

## 2014-02-22 DIAGNOSIS — E039 Hypothyroidism, unspecified: Secondary | ICD-10-CM | POA: Diagnosis not present

## 2014-03-03 ENCOUNTER — Encounter: Payer: Self-pay | Admitting: Cardiovascular Disease

## 2014-03-03 ENCOUNTER — Ambulatory Visit (INDEPENDENT_AMBULATORY_CARE_PROVIDER_SITE_OTHER): Payer: Medicare Other | Admitting: Cardiovascular Disease

## 2014-03-03 VITALS — BP 112/70 | HR 54 | Ht 68.0 in | Wt 158.7 lb

## 2014-03-03 DIAGNOSIS — E039 Hypothyroidism, unspecified: Secondary | ICD-10-CM

## 2014-03-03 DIAGNOSIS — I10 Essential (primary) hypertension: Secondary | ICD-10-CM | POA: Diagnosis not present

## 2014-03-03 DIAGNOSIS — Z7901 Long term (current) use of anticoagulants: Secondary | ICD-10-CM

## 2014-03-03 DIAGNOSIS — I480 Paroxysmal atrial fibrillation: Secondary | ICD-10-CM

## 2014-03-03 DIAGNOSIS — R42 Dizziness and giddiness: Secondary | ICD-10-CM

## 2014-03-03 DIAGNOSIS — I4729 Other ventricular tachycardia: Secondary | ICD-10-CM

## 2014-03-03 DIAGNOSIS — I472 Ventricular tachycardia: Secondary | ICD-10-CM

## 2014-03-03 DIAGNOSIS — I4891 Unspecified atrial fibrillation: Secondary | ICD-10-CM | POA: Diagnosis not present

## 2014-03-03 NOTE — Progress Notes (Signed)
Patient ID: Tanner Ho, male   DOB: [birth year], 73 y.o.   MRN: [id number]      HPI: Tanner Ho, is a 73 y.o. male who presents to the office today for a one-year follow-up cardiology evaluation.   Mr. [name] has a history of mitral valve prolapse, hypertension, hyperlipidemia, hypothyroidism, as well as paroxysmal atrial fibrillation. He is on chronic Coumadin anticoagulation. His last documented atrial fibrillation episode was in October 2013. A nuclear perfusion study in October 2013 revealed normal perfusion imaging but he developed 1-2 mm of ST segment depression at peak stress test and the possibility of microvascular etiology leading to his ECG abnormalities. A cardiopulmonary met test on 09/17/2012 demonstarated a reduced peak maximum oxygen consumption at 58%. He had a suboptimal peak cardiovascular stress load making cardiovascular status interpretation indeterminate. He did have mild ventilation/perfusion mismatch suggesting impaired ulnar circulation plus minus increased dead space. He had a blunted chronotropic response to exercise. The test was limited by leg fatigue. When I saw him in the office subsequently I reduced his Cardizem from 240 to180 mg. At times, he notes a rare palpitation. He denies any awareness of breakthrough atrial fibrillation.   Last yeat he presented to the emergency room in November with some vague chest pain. In retrospect, he feels this may have been GERD symptoms. Cardiac enzymes were negative. ECG was without changes. However, he subsequently saw [name], NP. He was referred for nuclear perfusion study on 04/22/2013 which was low risk and showed mild diaphragmatic attenuation but did not show a region of scar or ischemia, unchanged from his prior study of several years previously.  [name] denies any recurrent episodes of palpitations.  He had noticed some dizziness and decreased energy, and August, was seen by [name] at our office.  He did wear  a cardiac monitor, which revealed episodes of PACs, bradycardia, with heart rates down to 48, but he also had a 6 beat run of wide-complex tachycardia at a rate of 131.  Remotely, he had developed breakthrough atrial fibrillation, on amiodarone 100 mg and he has been on 100 mg, alternating with 200 mg daily.  On this most recent monitor, there were no episodes of recurrent AF. He also is on rate control medication with Cardizem CD 180 mg in addition to metoprolol succinate at low dose of just 12.5 mg.  His dizziness has improved.  He continues to be on Coumadin anticoagulation.  He denies recurrent chest pain, PND, or orthopnea.    Past Medical History  Diagnosis Date  . Hypertension   . GERD (gastroesophageal reflux disease)   . MVP (mitral valve prolapse)     on cardiac cath  . Hyperlipidemia LDL goal < 100   . PAF (paroxysmal atrial fibrillation)     last episode 03/2012  . Chronic anticoagulation     on coumadin followed by [name], PhD  . H/O cardiovascular stress test 03/2012    EKG with 1-2 mm ST segment depression but normal perfusion images. Followup metastases test reduced exercise effort and functional status and determinate for myocardial dysfunction    History reviewed. No pertinent past surgical history.  Allergies  Allergen Reactions  . Ciprofloxacin Other (See Comments)    Cannot take due to currently taking Amiodarone  . Hytrin [Terazosin] Other (See Comments)    Syncopal event     Current Outpatient Prescriptions  Medication Sig Dispense Refill  . acetaminophen (TYLENOL) 500 MG tablet Take 500 mg by mouth every  6 (six) hours as needed for moderate pain.      Marland Kitchen alfuzosin (UROXATRAL) 10 MG 24 hr tablet Take 10 mg by mouth daily.      Marland Kitchen amiodarone (PACERONE) 200 MG tablet 200 mg on evens days, 100 mg on odds days      . Calcium Citrate (CITRACAL PO) Take 2 capsules by mouth.       . Cholecalciferol (VITAMIN D-3) 1000 UNITS CAPS Take 1 capsule by mouth 2 (two) times  daily.      Marland Kitchen diltiazem (CARDIZEM CD) 120 MG 24 hr capsule Take 1 capsule (120 mg total) by mouth daily.  30 capsule  6  . finasteride (PROSCAR) 5 MG tablet Take 5 mg by mouth daily.      . fish oil-omega-3 fatty acids 1000 MG capsule Take 1 g by mouth 2 (two) times daily.      Marland Kitchen FLOVENT HFA 110 MCG/ACT inhaler 1 puff in he am and 1 puff in the pm      . levalbuterol (XOPENEX HFA) 45 MCG/ACT inhaler Inhale 1 puff into the lungs every 4 (four) hours as needed for wheezing.  1 Inhaler  12  . levothyroxine (SYNTHROID, LEVOTHROID) 25 MCG tablet Take 50 mcg by mouth daily. Take at least 60 minutes before breakfast      . losartan (COZAAR) 50 MG tablet Take 50 mg by mouth daily.      . magnesium hydroxide (MILK OF MAGNESIA) 800 MG/5ML suspension Take 5 mLs by mouth daily as needed for constipation.      . metoprolol succinate (TOPROL-XL) 25 MG 24 hr tablet TAKE 1/2 TABLET DAILY.  45 tablet  3  . Multiple Vitamins-Minerals (PRESERVISION AREDS) CAPS Take 1 capsule by mouth 2 (two) times daily. VITAMIN A FREE      . OVER THE COUNTER MEDICATION Apply 1 application topically daily.      Vladimir Faster Glycol-Propyl Glycol (SYSTANE) 0.4-0.3 % SOLN Apply 2 drops to eye daily as needed (for dry eeys).      . polyethylene glycol (MIRALAX / GLYCOLAX) packet Take 17 g by mouth daily.      . rosuvastatin (CRESTOR) 5 MG tablet Take 5 mg by mouth daily at 6 PM.       . warfarin (COUMADIN) 5 MG tablet Take 2.5-5 mg by mouth daily. Takes 2.38m on thursdays Takes 524mall other days       No current facility-administered medications for this visit.    History   Social History  . Marital Status: Married    Spouse Name: N/A    Number of Children: N/A  . Years of Education: N/A   Occupational History  . Not on file.   Social History Main Topics  . Smoking status: Never Smoker   . Smokeless tobacco: Not on file  . Alcohol Use: No  . Drug Use: Not on file  . Sexual Activity: Not on file   Other Topics Concern   . Not on file   Social History Narrative  . No narrative on file    Social history is notable in that he is married. 2 children 4 grandchildren. No tobacco alcohol use.  ROS General: Negative; No fevers, chills, or night sweats;  HEENT: Negative; No changes in vision or hearing, sinus congestion, difficulty swallowing Pulmonary: Negative; No cough, wheezing, shortness of breath, hemoptysis Cardiovascular: Negative; No chest pain, presyncope, syncope, palpitations Rare ankle swell GI: Positive for GERD; No nausea, vomiting, diarrhea, or abdominal pain GU:  Negative; No dysuria, hematuria, or difficulty voiding Musculoskeletal: Negative; no myalgias, joint pain, or weakness Hematologic/Oncology: Negative; no easy bruising, bleeding Endocrine: Negative; no heat/cold intolerance; no diabetes Neuro: Negative; no changes in balance, headaches Skin: Negative; No rashes or skin lesions Psychiatric: Negative; No behavioral problems, depression Sleep: Negative; No snoring, daytime sleepiness, hypersomnolence, bruxism, restless legs, hypnogognic hallucinations, no cataplexy Other comprehensive 14 point system review is negative.   PE BP 112/70  Pulse 54  Ht '5\' 8"'  (1.727 m)  Wt 158 lb 11.2 oz (71.986 kg)  BMI 24.14 kg/m2 Repeat blood pressure by me was 130/70  General: Alert, oriented, no distress.  Skin: normal turgor, no rashes HEENT: Normocephalic, atraumatic. Pupils round and reactive; sclera anicteric;no lid lag.  Nose without nasal septal hypertrophy Mouth/Parynx benign; Mallinpatti scale 3 Neck: No JVD, no carotid bruitts No chest wall tenderness to palpation Lungs: clear to ausculatation and percussion; no wheezing or rales Heart: RRR, s1 s2 normal, with a 1/6 systolic murmur.  No diastolic murmur.  No rubs, thrills or heaves Abdomen: soft, nontender; no hepatosplenomehaly, BS+; abdominal aorta nontender and not dilated by palpation. Back: No CVA tenderness Pulses  2+ Extremities: ankle edema, no clubbing cyanosis, Homan's sign negative  Neurologic: grossly nonfocal Psychological: Normal cognitive function, normal affect and mood  ECG (independently read by me): Sinus bradycardia 50 beats per minute.  Nonspecific ST changes.  Intervals per  Prior ECG: Sinus bradycardia at 50 beats per minute. No ectopy on ECG. Intervals normal  LABS:  BMET    Component Value Date/Time   NA 141 01/05/2014 0942   K 4.6 01/05/2014 0942   CL 105 01/05/2014 0942   CO2 28 01/05/2014 0942   GLUCOSE 98 01/05/2014 0942   BUN 22 01/05/2014 0942   CREATININE 1.46* 01/05/2014 0942   CREATININE 1.26 04/14/2013 2212   CALCIUM 9.4 01/05/2014 0942   GFRNONAA 55* 04/14/2013 2212   GFRAA 64* 04/14/2013 2212     Hepatic Function Panel     Component Value Date/Time   PROT 6.7 01/05/2014 0942     CBC    Component Value Date/Time   WBC 5.2 01/05/2014 0942   RBC 4.04* 01/05/2014 0942   HGB 13.9 01/05/2014 0942   HCT 39.7 01/05/2014 0942   PLT 155 01/05/2014 0942   MCV 98.3 01/05/2014 0942   MCH 34.4* 01/05/2014 0942   MCHC 35.0 01/05/2014 0942   RDW 13.6 01/05/2014 0942     BNP No results found for this basename: probnp    Lipid Panel  No results found for this basename: chol,  trig,  hdl,  cholhdl,  vldl,  ldlcalc     RADIOLOGY: No results found.    ASSESSMENT AND PLAN: Ms. Dorris Fetch is maintaining sinus rhythm and is mildlt bradycardic with a pulse of 54.  He is unaware of any breakthrough atrial fibrillation.  He continues to be on amiodarone 200 mg, alternating with 100 mg every other day, Toprol-XL 12.5 mg, Cardizem CD 120 mg.  At times, he has noted some mild leg edema intermittently.  I reviewed his cardiac monitor with him in detail.  He did have episodes of bradycardia into the 40s, but also had one episode of 6 beats of nonsustained ventricular tachycardia at a rate of 131.  In attempt to simplify his medical regimen, I am suggesting that he discontinue his diltiazem.  I will  slightly titrate his Toprol-XL to 25 mg and hopefully will suppress further NSVT.  He also is on losartan  50 mg daily for blood pressure control.  In addition to his Toprol.  He is on Crestor 5 mg for hyperlipidemia, and denies myalgias.  I long discussion with him today regarding alternative therapy with NOAC treatment versus continued therapy with warfarin per his request.  He will be contemplating this alternative option.  He does have a history of GERD, but this has been stable.  There is a history of hypothyroidism and is tolerating levothyroxin 50 mcg.  I will see him in 2 months for reevaluation and further recommendations will be made at that time.   Troy Sine, MD, [facility]  03/03/2014 3:15 PM

## 2014-03-03 NOTE — Patient Instructions (Signed)
Your physician has recommended you make the following change in your medication: stop diltiazem. Increase the metoprolol succ to 1 tablet daily.  Your physician recommends that you schedule a follow-up appointment in: 2 months.

## 2014-03-05 ENCOUNTER — Encounter: Payer: Self-pay | Admitting: Cardiovascular Disease

## 2014-03-05 DIAGNOSIS — I4729 Other ventricular tachycardia: Secondary | ICD-10-CM | POA: Insufficient documentation

## 2014-03-05 DIAGNOSIS — I472 Ventricular tachycardia: Secondary | ICD-10-CM | POA: Insufficient documentation

## 2014-03-07 ENCOUNTER — Telehealth: Payer: Self-pay | Admitting: Cardiovascular Disease

## 2014-03-07 MED ORDER — LOSARTAN POTASSIUM 50 MG PO TABS: ORAL_TABLET | ORAL | Status: DC

## 2014-03-07 NOTE — Telephone Encounter (Signed)
Returned call to patient he stated he saw Dr.Kelly 03/03/14.Stated diltiazem was stopped and metoprolol increased to 12.5 mg twice a day.Stated he felt bad,weak,shaky B/P yesterday 03/06/14 140/60 pulse 44,174/66 pulse 44,188/76 pulse 48.Stated called Dr.on call Dr.Tilley he advised him to take a extra losartan.Stated this morning he continues to feel bad weak,shaky B/P 176/69 pulse 49.Spoke to DOD Dr.Harding he advised to increase losartan to 50 mg twice a day.Advised to continue to monitor B/P and call back if continues to be elevated.

## 2014-03-07 NOTE — Telephone Encounter (Signed)
Pt blood pressure started bothering him Sunday evening. It was up to 188/73. He talked to Dr Wynonia Lawman last night and told him to tale on more of 50 mg of Losartan. He told him to call Dr Claiborne Billings today.Dr Claiborne Billings stop his Diltiazem Thursday and increased his Metoprolol. Please call to advise him.

## 2014-03-08 ENCOUNTER — Telehealth: Payer: Self-pay | Admitting: Cardiovascular Disease

## 2014-03-08 MED ORDER — METOPROLOL SUCCINATE ER 25 MG PO TB24: 25.0000 mg | ORAL_TABLET | Freq: Every day | ORAL | Status: DC

## 2014-03-08 NOTE — Telephone Encounter (Signed)
Need a prescription for Metoprolol 25mg  1/2 in the a.m.1/2 in the p.m.#90. Please call to [facility].

## 2014-03-08 NOTE — Telephone Encounter (Signed)
Rx was sent to pharmacy electronically. 

## 2014-03-09 ENCOUNTER — Telehealth: Payer: Self-pay | Admitting: Cardiovascular Disease

## 2014-03-09 NOTE — Telephone Encounter (Signed)
Pt is still up high. Pt wants to know if Dr Claiborne Billings or somebody could see him today please.

## 2014-03-09 NOTE — Telephone Encounter (Signed)
Patient called concerning about his BP - today 158/62.  Reassured patient it is better than on 03/07/14 when meds were changed and to give the increased dose of Metoprolol a little longer to be fully effective. Explained due to his low heart rate the doctors need to be careful increasing his meds too fast.  Told to keep a log of BP and HR readings and call if any problems.  Patient voiced understanding.

## 2014-03-10 NOTE — Telephone Encounter (Signed)
Agree with recommendation

## 2014-03-18 DIAGNOSIS — Z23 Encounter for immunization: Secondary | ICD-10-CM | POA: Diagnosis not present

## 2014-03-20 IMAGING — US US ABDOMEN COMPLETE
1 series · 13 of 25 positions shown · non-contrast
Comparison: CT abdomen pelvis of 06/14/2005

CLINICAL DATA: Elevated alkaline phosphatase, on medication for
high cholesterol and hypertension. History of urinary bladder
carcinoma.

COMPLETE ABDOMINAL ULTRASOUND

[Series 1: us abdomen complete · 0.32mm/px · 13 of 82 slices shown]
[im 1/82]
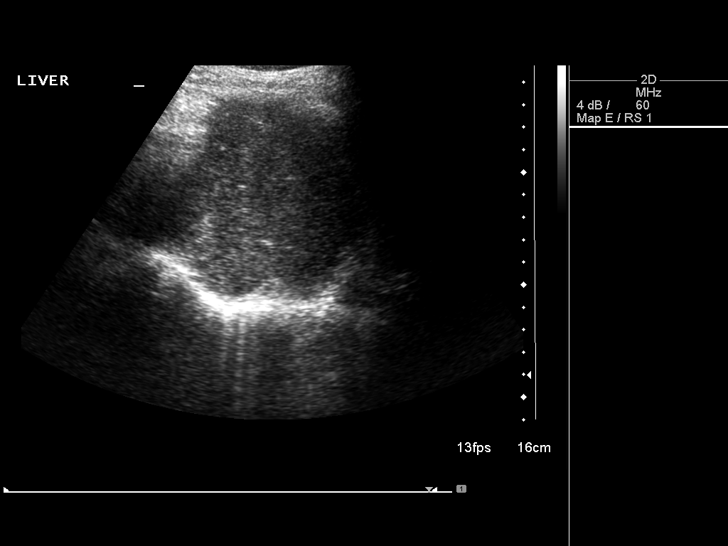
[im 7/82]
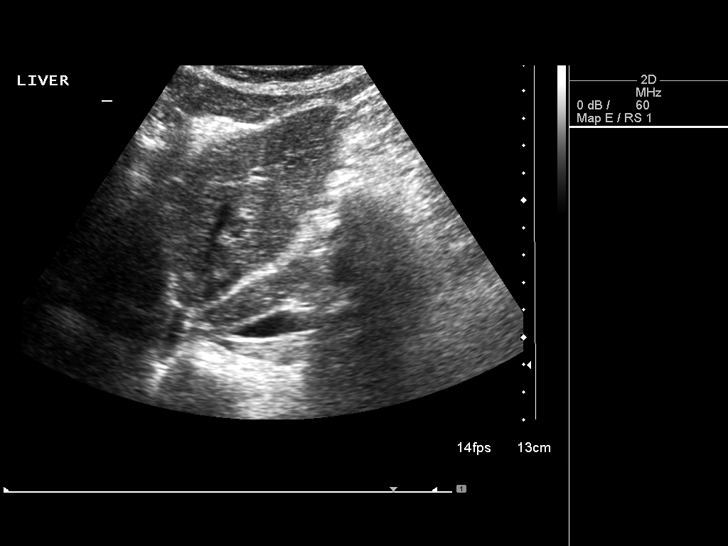
[im 14/82]
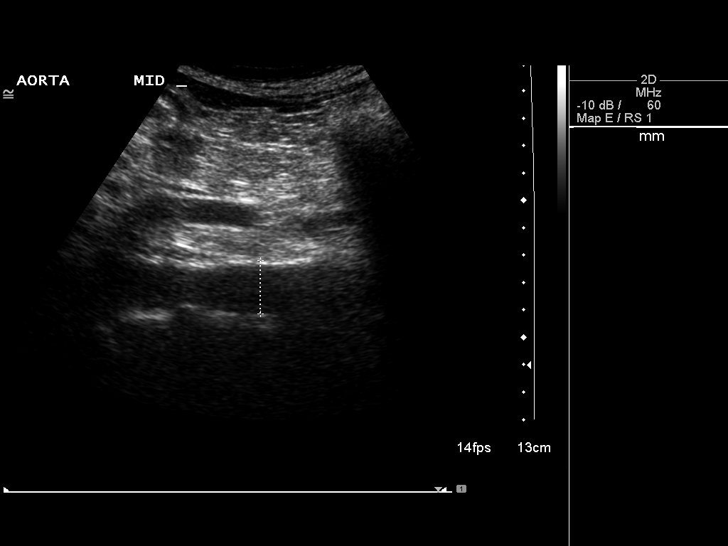
[im 21/82]
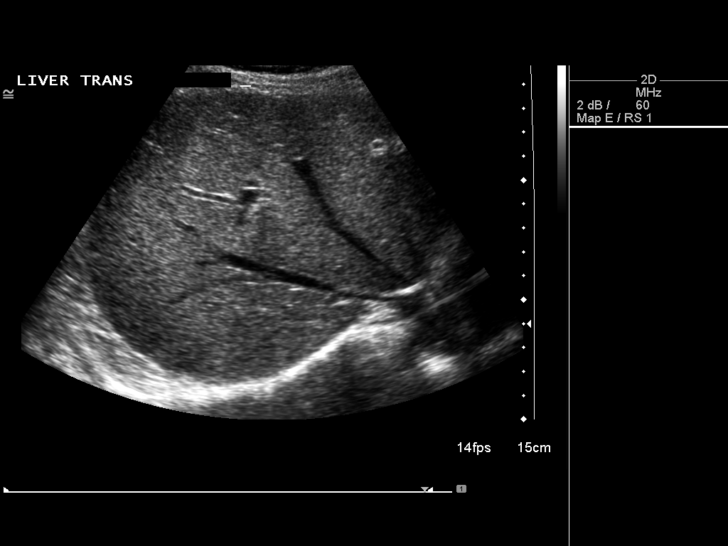
[im 28/82]
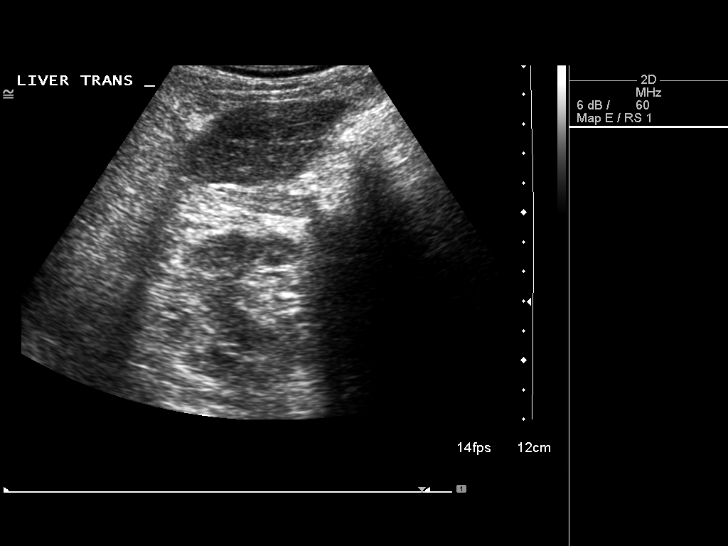
[im 34/82]
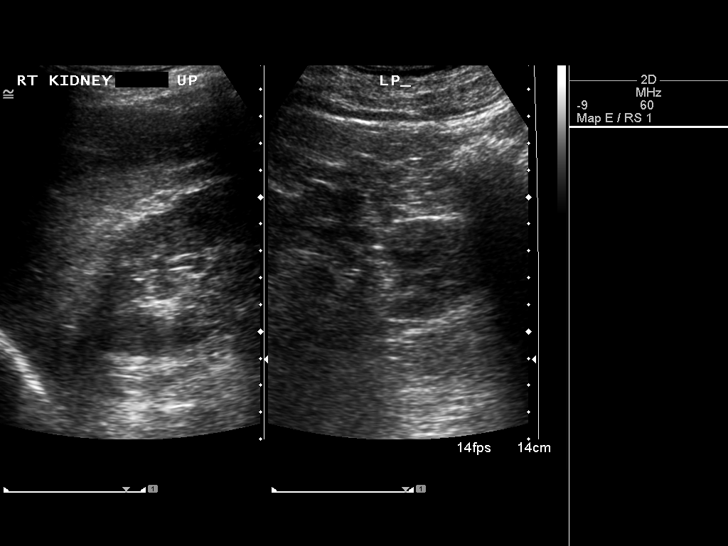
[im 41/82]
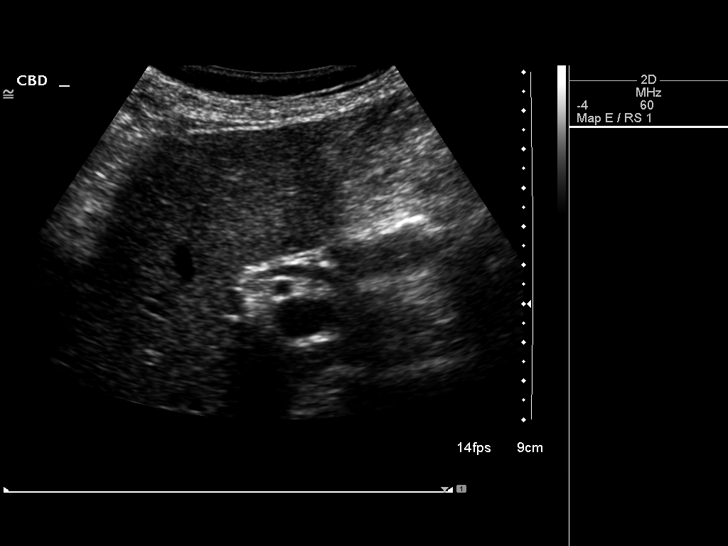
[im 48/82]
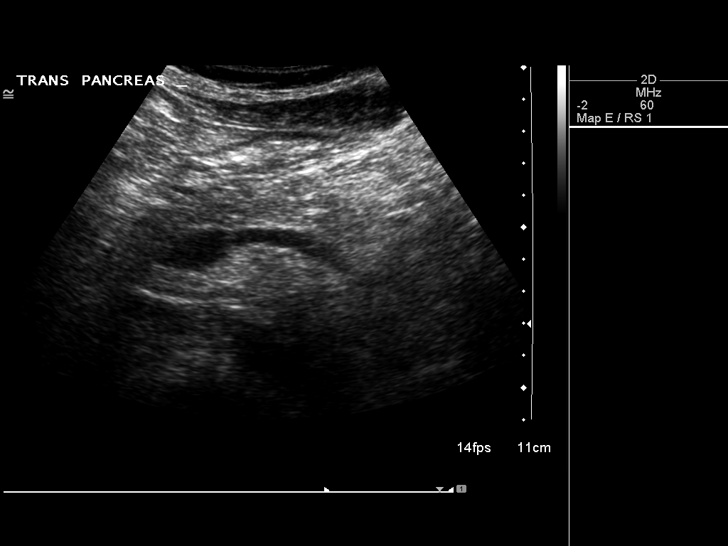
[im 55/82]
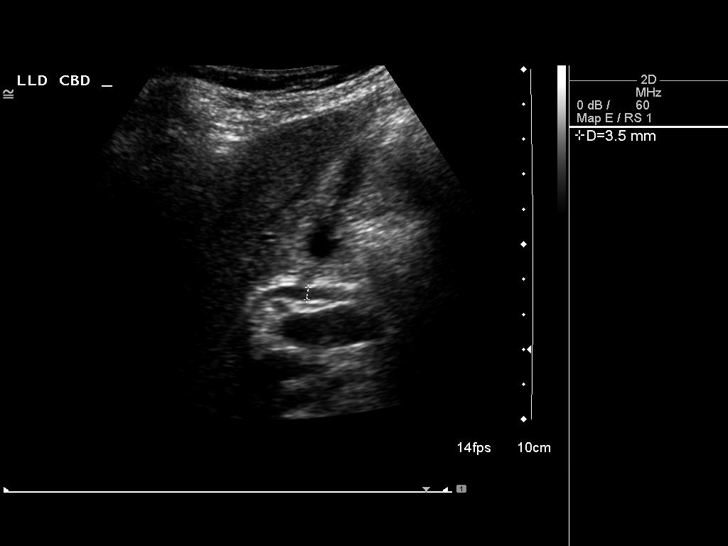
[im 61/82]
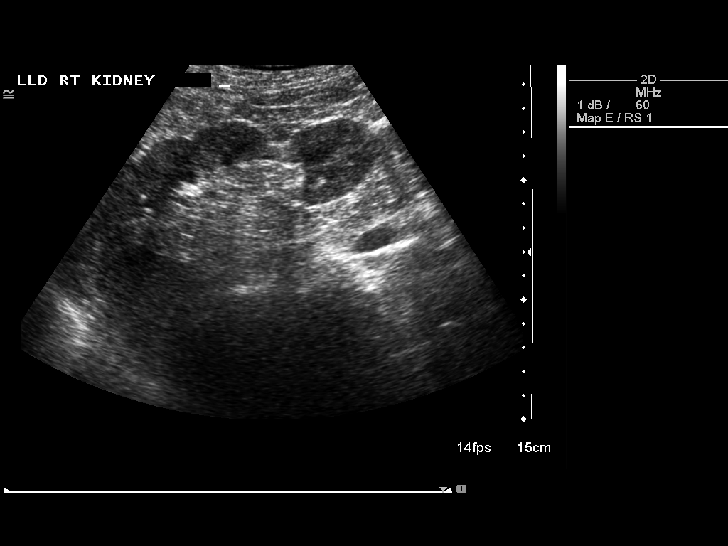
[im 68/82]
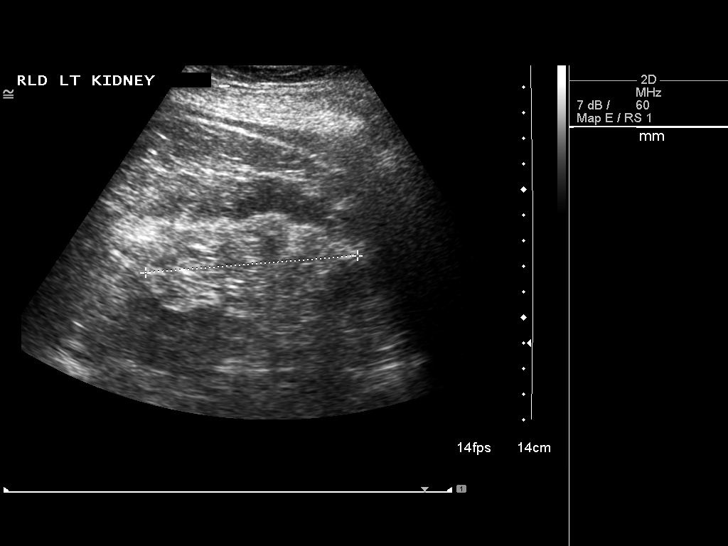
[im 75/82]
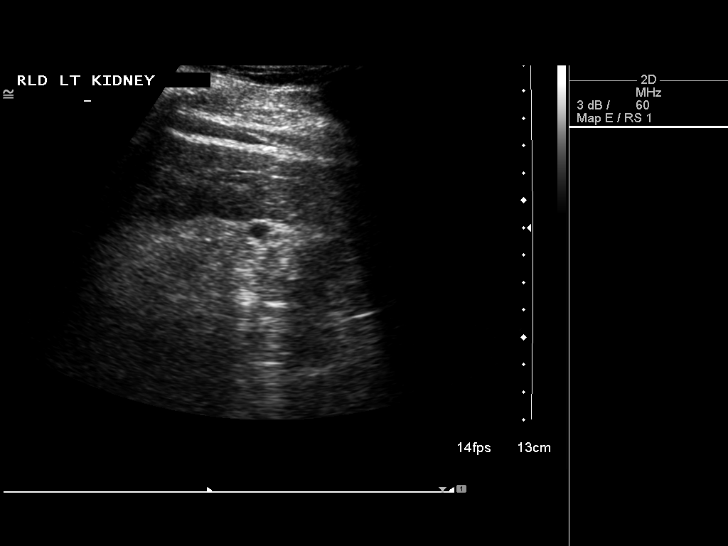
[im 82/82]
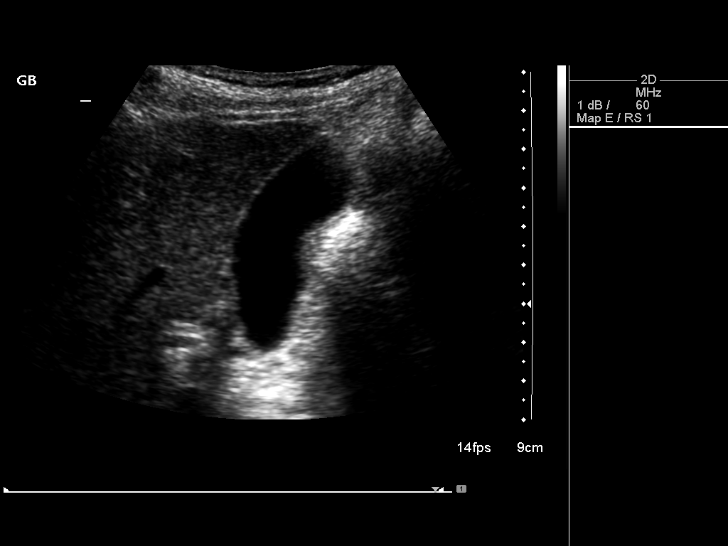

[13 of 25 positions shown; findings below may reference images not displayed]

FINDINGS: Gallbladder:  The gallbladder is visualized and no gallstones are
noted.  There is no pain over gallbladder with compression.

Common bile duct:  The common bile duct is normal measuring 3.5 mm
in diameter.

Liver:  The liver is echogenic consistent with fatty infiltration.
No focal abnormality is seen.

IVC:  Appears normal.

Pancreas:  The tail of the pancreas is partially obscured by bowel
gas.

Spleen:  The spleen is normal measuring 6.4 cm sagittally.

Right Kidney:  No hydronephrosis is seen.  The right kidney
measures 13.6 cm sagittally.  The right kidney is somewhat lobular
but no definite mass is seen.  A small cyst is noted in the lower
pole of no more than 9 mm in diameter.

Left Kidney:  No hydronephrosis is noted.  The left kidney measures
9.6 cm sagittally.  The left kidney is very echogenic suggesting
chronic renal medical disease and atrophy.

Abdominal aorta:  The abdominal aorta is partially obscured by
bowel gas.
IMPRESSION: 1.  Echogenic liver parenchyma consistent with fatty infiltration.
No focal abnormality.
2.  Very echogenic and small left kidney consistent with fibrosis
and atrophy.
3.  The tail of the pancreas is obscured by bowel gas as is of part
of the abdominal aorta.

## 2014-03-29 ENCOUNTER — Telehealth: Payer: Self-pay | Admitting: Cardiovascular Disease

## 2014-03-29 NOTE — Telephone Encounter (Signed)
Tanner Ho is calling because his systolic reading of his Bp is 177 and he not feeling well it sort of gives him a headache . His bp medication was changed on his last visit . He is now taking Metoprolol ER takes a half of tablet int he mornings and evenings. 12.5mg  which totals to 25mg , then he takes losartan a total of 100mg  . 50 in the morning and 50 in the eveningPlease call    Thanks

## 2014-03-29 NOTE — Telephone Encounter (Signed)
Spoke with patient. He reports high systolic BP readings this AM with a headache & weakness  10/27 AM (prior to meds) 172/65 HR 50 10/27 AM (after meds)  177/63 HR 46 10/26 AM (prior to meds) 169/66 HR 53 10/26 AM (after meds) 114/52 HR 53 10/26 dinnertime  163/59 HR 54 10/26 9pm (after meds) 153/56 HR 48  Patient takes losartan 50mg  BID, toprol 12.5mg  BID Patient reports he felt much better when he was taking diltiazem.   Will defer to Dr. Claiborne Billings to advise on medications. He has called in a few times c/o issues with BP

## 2014-03-30 NOTE — Telephone Encounter (Signed)
Patient called in with BPs  Yesterday midday 177/63 HR 43 Today        158/64 HR 48

## 2014-03-30 NOTE — Telephone Encounter (Signed)
Per Dr. Claiborne Billings, patient should DECREASE AMIODARONE TO 100MG  DAILY. He should monitor BP and HR and provide update towards end of this week or early next week. If HR is up and BP still elevated, Dr. Claiborne Billings would recommend adding amlodipine 2.5mg  (but only after patient provides an update with BP and HR readings to ensure that HR has come up). This information was communicated to patient and he voiced understanding. Medication list updated to reflect change in amiodarone instructions.

## 2014-03-31 DIAGNOSIS — Z7901 Long term (current) use of anticoagulants: Secondary | ICD-10-CM | POA: Diagnosis not present

## 2014-03-31 DIAGNOSIS — I4891 Unspecified atrial fibrillation: Secondary | ICD-10-CM | POA: Diagnosis not present

## 2014-04-05 ENCOUNTER — Telehealth: Payer: Self-pay | Admitting: Cardiovascular Disease

## 2014-04-05 NOTE — Telephone Encounter (Signed)
LMTCB

## 2014-04-05 NOTE — Telephone Encounter (Signed)
Pt said he was told to call back this week with his blood pressure readings.

## 2014-04-06 NOTE — Telephone Encounter (Signed)
Spoke to patient On 03/30/14 amiodarone decrease  to 100 mg He states he has been taking blood pressure reading  per last triage conversation 03/31/14  7:30 am  151/67  p 54       11:30 am 142/70 p 64 04/01/14 8 am         169/65 p 46    10:30am    118/53 p 52    8:30 pm    142/59 p 52 04/02/14 7 am        175/66 p 48                12 pm       174/67 p 43                 9:40 pm   181/69 p 48 04/03/14   8:30 am 168/69  p51                2 pm       160/64 p 49                9 pm        153/ 63 p 50 04/04/14   6pm        156/63 p 49                8:15 pm  131/56 p 51 after taking night medications 04/05/14 7 am         172/70 p 47              6:30 pm    163/69 p 48 04/06/14 7 am         162/64 p 50 RN informed patient to continue current medications. Will defer to DR Claiborne Billings for any changes.

## 2014-04-06 NOTE — Telephone Encounter (Signed)
I left a message for the patient to call. 

## 2014-04-06 NOTE — Telephone Encounter (Signed)
Returning call,says he would like to talk to [place] if possible.

## 2014-04-07 MED ORDER — AMLODIPINE BESYLATE 5 MG PO TABS: 5.0000 mg | ORAL_TABLET | Freq: Every day | ORAL | Status: DC

## 2014-04-07 NOTE — Telephone Encounter (Signed)
Spoke to patient RN gave information to patient, per Dr Claiborne Billings  patient states he is not taking Nicanor Alcon now. Patient is aware to start Amlodipine 5 mg daily  Patient states the diltiazem was stopped about a month ago. Patient called in after hours 03/06/14 spoke to Dr Wynonia Lawman- Losartan was increased to 50 mg twice a day. He wanted Dr Claiborne Billings to be aware.  RN informed patient will route information to Dr Claiborne Billings.

## 2014-04-07 NOTE — Telephone Encounter (Signed)
ok 

## 2014-04-07 NOTE — Telephone Encounter (Signed)
BP elevated; he remains bradycardic. DC cardizem 120 mg and change to amlodipine 5 mg daily.

## 2014-04-15 DIAGNOSIS — I10 Essential (primary) hypertension: Secondary | ICD-10-CM | POA: Diagnosis not present

## 2014-04-15 DIAGNOSIS — E039 Hypothyroidism, unspecified: Secondary | ICD-10-CM | POA: Diagnosis not present

## 2014-04-15 DIAGNOSIS — Z0000 Encounter for general adult medical examination without abnormal findings: Secondary | ICD-10-CM | POA: Diagnosis not present

## 2014-04-15 DIAGNOSIS — E780 Pure hypercholesterolemia: Secondary | ICD-10-CM | POA: Diagnosis not present

## 2014-04-15 DIAGNOSIS — Z23 Encounter for immunization: Secondary | ICD-10-CM | POA: Diagnosis not present

## 2014-04-15 DIAGNOSIS — Z125 Encounter for screening for malignant neoplasm of prostate: Secondary | ICD-10-CM | POA: Diagnosis not present

## 2014-04-18 ENCOUNTER — Encounter: Payer: Self-pay | Admitting: Cardiovascular Disease

## 2014-04-19 DIAGNOSIS — H43812 Vitreous degeneration, left eye: Secondary | ICD-10-CM | POA: Diagnosis not present

## 2014-04-19 DIAGNOSIS — H33302 Unspecified retinal break, left eye: Secondary | ICD-10-CM | POA: Diagnosis not present

## 2014-04-19 DIAGNOSIS — H3531 Nonexudative age-related macular degeneration: Secondary | ICD-10-CM | POA: Diagnosis not present

## 2014-04-20 DIAGNOSIS — N281 Cyst of kidney, acquired: Secondary | ICD-10-CM | POA: Diagnosis not present

## 2014-04-20 DIAGNOSIS — N312 Flaccid neuropathic bladder, not elsewhere classified: Secondary | ICD-10-CM | POA: Diagnosis not present

## 2014-04-20 DIAGNOSIS — N401 Enlarged prostate with lower urinary tract symptoms: Secondary | ICD-10-CM | POA: Diagnosis not present

## 2014-04-20 DIAGNOSIS — N261 Atrophy of kidney (terminal): Secondary | ICD-10-CM | POA: Diagnosis not present

## 2014-04-20 DIAGNOSIS — R339 Retention of urine, unspecified: Secondary | ICD-10-CM | POA: Diagnosis not present

## 2014-04-21 DIAGNOSIS — Z87898 Personal history of other specified conditions: Secondary | ICD-10-CM | POA: Diagnosis not present

## 2014-04-21 DIAGNOSIS — Z7901 Long term (current) use of anticoagulants: Secondary | ICD-10-CM | POA: Diagnosis not present

## 2014-04-21 DIAGNOSIS — I10 Essential (primary) hypertension: Secondary | ICD-10-CM | POA: Diagnosis not present

## 2014-04-21 DIAGNOSIS — R600 Localized edema: Secondary | ICD-10-CM | POA: Diagnosis not present

## 2014-04-26 ENCOUNTER — Telehealth: Payer: Self-pay | Admitting: Cardiovascular Disease

## 2014-04-26 NOTE — Telephone Encounter (Signed)
Received records from [facility] (Dr [name]) for appointment with Dr Claiborne Billings on 05/06/14.  Records given to [facility] (medical records) for Dr Evette Georges schedule on 05/06/14.  lp

## 2014-05-02 DIAGNOSIS — Z7901 Long term (current) use of anticoagulants: Secondary | ICD-10-CM | POA: Diagnosis not present

## 2014-05-02 DIAGNOSIS — I482 Chronic atrial fibrillation: Secondary | ICD-10-CM | POA: Diagnosis not present

## 2014-05-02 DIAGNOSIS — I4891 Unspecified atrial fibrillation: Secondary | ICD-10-CM | POA: Diagnosis not present

## 2014-05-06 ENCOUNTER — Ambulatory Visit (INDEPENDENT_AMBULATORY_CARE_PROVIDER_SITE_OTHER): Payer: Medicare Other | Admitting: Cardiovascular Disease

## 2014-05-06 VITALS — BP 120/60 | HR 49 | Ht 68.0 in | Wt 158.7 lb

## 2014-05-06 DIAGNOSIS — E785 Hyperlipidemia, unspecified: Secondary | ICD-10-CM

## 2014-05-06 DIAGNOSIS — R001 Bradycardia, unspecified: Secondary | ICD-10-CM

## 2014-05-06 DIAGNOSIS — Z7901 Long term (current) use of anticoagulants: Secondary | ICD-10-CM

## 2014-05-06 DIAGNOSIS — N183 Chronic kidney disease, stage 3 unspecified: Secondary | ICD-10-CM

## 2014-05-06 DIAGNOSIS — I480 Paroxysmal atrial fibrillation: Secondary | ICD-10-CM | POA: Diagnosis not present

## 2014-05-06 DIAGNOSIS — I10 Essential (primary) hypertension: Secondary | ICD-10-CM

## 2014-05-06 MED ORDER — METOPROLOL SUCCINATE ER 25 MG PO TB24: 12.5000 mg | ORAL_TABLET | ORAL | Status: DC

## 2014-05-06 NOTE — Patient Instructions (Signed)
Your physician has recommended you make the following change in your medication: decrease the metoprolol succ to 1/2 tablet once daily.  Your physician wants you to follow-up in: 6 months or sooner if needed. You will receive a reminder letter in the mail two months in advance. If you don't receive a letter, please call our office to schedule the follow-up appointment.

## 2014-05-06 NOTE — Progress Notes (Signed)
Patient ID: Tanner Ho, male   DOB: November 28, 1940, 73 y.o.   MRN: [id number]      HPI: Tanner Ho is a 73 y.o. male who presents to the office today for a one-year follow-up cardiology evaluation.   Mr. Mees has a history of mitral valve prolapse, hypertension, hyperlipidemia, hypothyroidism, as well as paroxysmal atrial fibrillation. He is on chronic Coumadin anticoagulation. His last documented atrial fibrillation episode was in October 2013. A nuclear perfusion study in October 2013 revealed normal perfusion imaging but he developed 1-2 mm of ST segment depression at peak stress test and the possibility of microvascular etiology leading to his ECG abnormalities. A cardiopulmonary met test on 09/17/2012 demonstarated a reduced peak maximum oxygen consumption at 58%. He had a suboptimal peak cardiovascular stress load making cardiovascular status interpretation indeterminate. He did have mild ventilation/perfusion mismatch suggesting impaired ulnar circulation plus minus increased dead space. He had a blunted chronotropic response to exercise. The test was limited by leg fatigue. When I saw him in the office subsequently I reduced his Cardizem from 240 to180 mg. At times, he notes a rare palpitation. He denies any awareness of breakthrough atrial fibrillation.   Last yeat he presented to the emergency room in November with some vague chest pain. In retrospect, he feels this may have been GERD symptoms. Cardiac enzymes were negative. ECG was without changes. However, he subsequently saw [name], NP. A nuclear perfusion study on 04/22/2013 which was low risk and showed mild diaphragmatic attenuation but did not show a region of scar or ischemia, unchanged from his prior study of several years previously.  Mr. Mcfetridge denies any recurrent episodes of palpitations.  He had noticed some dizziness and decreased energy, and August, was seen by [name] at our office.  He did wear a cardiac monitor,  which revealed episodes of PACs, bradycardia, with heart rates down to 48, but he also had a 6 beat run of wide-complex tachycardia at a rate of 131.  Remotely, he had developed breakthrough atrial fibrillation, on amiodarone 100 mg and he has been on 100 mg, alternating with 200 mg daily.  On this most recent monitor, there were no episodes of recurrent AF.   Over the last several months, he feels his dizziness has improved.  He is unaware of any breakthrough atrial fibrillation or episodes of palpitations.  He denies any anginal symptoms.  He denies significant edema.  He presents for evaluation.   Past Medical History  Diagnosis Date  . Hypertension   . GERD (gastroesophageal reflux disease)   . MVP (mitral valve prolapse)     on cardiac cath  . Hyperlipidemia LDL goal < 100   . PAF (paroxysmal atrial fibrillation)     last episode 03/2012  . Chronic anticoagulation     on coumadin followed by [name], PhD  . H/O cardiovascular stress test 03/2012    EKG with 1-2 mm ST segment depression but normal perfusion images. Followup metastases test reduced exercise effort and functional status and determinate for myocardial dysfunction    History reviewed. No pertinent past surgical history.  Allergies  Allergen Reactions  . Ciprofloxacin Other (See Comments)    Cannot take due to currently taking Amiodarone  . Hytrin [Terazosin] Other (See Comments)    Syncopal event     Current Outpatient Prescriptions  Medication Sig Dispense Refill  . acetaminophen (TYLENOL) 500 MG tablet Take 500 mg by mouth every 6 (six) hours as needed for moderate pain.    Marland Kitchen  alfuzosin (UROXATRAL) 10 MG 24 hr tablet Take 10 mg by mouth daily.    Marland Kitchen amiodarone (PACERONE) 200 MG tablet Take 100 mg by mouth daily.     Marland Kitchen amLODipine (NORVASC) 5 MG tablet Take 1 tablet (5 mg total) by mouth daily. 30 tablet 6  . Calcium Citrate (CITRACAL PO) Take 2 capsules by mouth.     . Cholecalciferol (VITAMIN D-3) 1000 UNITS  CAPS Take 1 capsule by mouth 2 (two) times daily.    . finasteride (PROSCAR) 5 MG tablet Take 5 mg by mouth daily.    . fish oil-omega-3 fatty acids 1000 MG capsule Take 1 g by mouth 2 (two) times daily.    Marland Kitchen FLOVENT HFA 110 MCG/ACT inhaler 1 puff in he am and 1 puff in the pm    . levalbuterol (XOPENEX HFA) 45 MCG/ACT inhaler Inhale 1 puff into the lungs every 4 (four) hours as needed for wheezing. 1 Inhaler 12  . levothyroxine (SYNTHROID, LEVOTHROID) 50 MCG tablet Take 1 tablet by mouth daily.  2  . losartan (COZAAR) 50 MG tablet Take 50 mg twice a day 60 tablet 6  . magnesium hydroxide (MILK OF MAGNESIA) 800 MG/5ML suspension Take 5 mLs by mouth daily as needed for constipation.    . metoprolol succinate (TOPROL-XL) 25 MG 24 hr tablet Take 0.5 tablets (12.5 mg total) by mouth every morning. 90 tablet 3  . Multiple Vitamins-Minerals (PRESERVISION AREDS) CAPS Take 1 capsule by mouth 2 (two) times daily. VITAMIN A FREE    . OVER THE COUNTER MEDICATION Apply 1 application topically daily.    Vladimir Faster Glycol-Propyl Glycol (SYSTANE) 0.4-0.3 % SOLN Apply 2 drops to eye daily as needed (for dry eeys).    . polyethylene glycol (MIRALAX / GLYCOLAX) packet Take 17 g by mouth daily.    . rosuvastatin (CRESTOR) 5 MG tablet Take 5 mg by mouth daily at 6 PM.     . warfarin (COUMADIN) 5 MG tablet Take 2.5-5 mg by mouth daily. Takes 2.31m on thursdays Takes 578mall other days     No current facility-administered medications for this visit.    History   Social History  . Marital Status: Married    Spouse Name: N/A    Number of Children: N/A  . Years of Education: N/A   Occupational History  . Not on file.   Social History Main Topics  . Smoking status: Never Smoker   . Smokeless tobacco: Not on file  . Alcohol Use: No  . Drug Use: Not on file  . Sexual Activity: Not on file   Other Topics Concern  . Not on file   Social History Narrative    Social history is notable in that he is  married. 2 children 4 grandchildren. No tobacco alcohol use.  ROS General: Negative; No fevers, chills, or night sweats;  HEENT: Negative; No changes in vision or hearing, sinus congestion, difficulty swallowing Pulmonary: Negative; No cough, wheezing, shortness of breath, hemoptysis Cardiovascular: Negative; No chest pain, presyncope, syncope, palpitations Edema has improved GI: Positive for GERD; No nausea, vomiting, diarrhea, or abdominal pain GU: Negative; No dysuria, hematuria, or difficulty voiding Musculoskeletal: Negative; no myalgias, joint pain, or weakness Hematologic/Oncology: Negative; no easy bruising, bleeding Endocrine: Negative; no heat/cold intolerance; no diabetes Neuro: Negative; no changes in balance, headaches Skin: Negative; No rashes or skin lesions Psychiatric: Negative; No behavioral problems, depression Sleep: Negative; No snoring, daytime sleepiness, hypersomnolence, bruxism, restless legs, hypnogognic hallucinations, no cataplexy Other comprehensive  14 point system review is negative.   PE BP 120/60 mmHg  Pulse 49  Ht '5\' 8"'  (1.727 m)  Wt 158 lb 11.2 oz (71.986 kg)  BMI 24.14 kg/m2 General: Alert, oriented, no distress.  Skin: normal turgor, no rashes HEENT: Normocephalic, atraumatic. Pupils round and reactive; sclera anicteric;no lid lag.  Nose without nasal septal hypertrophy Mouth/Parynx benign; Mallinpatti scale 3 Neck: No JVD, no carotid bruits with normal carotid upstroke No chest wall tenderness to palpation Lungs: clear to ausculatation and percussion; no wheezing or rales Heart: RRR, s1 s2 normal, with a 1/6 systolic murmur.  No diastolic murmur.  No rubs, thrills or heaves Abdomen: soft, nontender; no hepatosplenomehaly, BS+; abdominal aorta nontender and not dilated by palpation. Back: No CVA tenderness Pulses 2+ Extremities: Resolution of prior edema, no clubbing cyanosis, Homan's sign negative  Neurologic: grossly  nonfocal Psychological: Normal cognitive function, normal affect and mood  ECG (independently read by me): Sinus bradycardia at 49 bpm.  Nonspecific ST changes.  QTc interval 424 ms.  ECG (independently read by me): Sinus bradycardia 50 beats per minute.  Nonspecific ST changes.    Prior ECG: Sinus bradycardia at 50 beats per minute. No ectopy on ECG. Intervals normal  LABS:  BMET    Component Value Date/Time   NA 141 01/05/2014 0942   K 4.6 01/05/2014 0942   CL 105 01/05/2014 0942   CO2 28 01/05/2014 0942   GLUCOSE 98 01/05/2014 0942   BUN 22 01/05/2014 0942   CREATININE 1.46* 01/05/2014 0942   CREATININE 1.26 04/14/2013 2212   CALCIUM 9.4 01/05/2014 0942   GFRNONAA 55* 04/14/2013 2212   GFRAA 64* 04/14/2013 2212     Hepatic Function Panel     Component Value Date/Time   PROT 6.7 01/05/2014 0942     CBC    Component Value Date/Time   WBC 5.2 01/05/2014 0942   RBC 4.04* 01/05/2014 0942   HGB 13.9 01/05/2014 0942   HCT 39.7 01/05/2014 0942   PLT 155 01/05/2014 0942   MCV 98.3 01/05/2014 0942   MCH 34.4* 01/05/2014 0942   MCHC 35.0 01/05/2014 0942   RDW 13.6 01/05/2014 0942     BNP No results found for: PROBNP  Lipid Panel  No results found for: CHOL   RADIOLOGY: No results found.    ASSESSMENT AND PLAN: Ms. Sulkowski is a 73 year old gentleman who has a history of hypertension, hyperlipidemia, hypothyroidism, mitral valve prolapse, as well as paroxysmal atrial fibrillation.  He continues to maintain sinus rhythm without recurrent atrial fibrillation.  He is bradycardic today with a pulse of 49.  He is now taking amiodarone at just 100 mg daily in addition to Toprol-XL 25 mg.  With his pulse in the upper 40s.  I'm reducing his Toprol-XL to just 12.5 mg daily.  His blood pressure is well controlled today and he is tolerating losartan 50 mg twice a day in addition to his Toprol and amlodipine 5 mg.  He is on Coumadin anticoagulation and denies bleeding.  An INR  from March 31, 2014  was therapeutic at 2.5 at Dr. Pennie Banter office.  He continues to take Crestor 5 mg daily for hyperlipidemia.  He denies myalgias He had seen Dr. Shelia Media last month and I reviewed his office note from 04/21/2014.  Laboratory from August 2015 did suggest mild renal insufficiency.  If his creatinine continues to increase his losartan dose may need to be reduced. He had been demonstrated to have a 6 beat episode of  nonsustained ventricular tachycardia at a rate of 131 on prior Card[employer] monitoring.  He is unaware of any palpitations.  I will not plan to discontinue his amiodarone since in the past.  He had developed recurrent AF.  I will see him in 6 months for cardiology reevaluation or sooner if problems arise.  Time spent: 25 minutes Troy Sine, MD, [facility]  05/08/2014 11:27 AM

## 2014-05-08 ENCOUNTER — Encounter: Payer: Self-pay | Admitting: Cardiovascular Disease

## 2014-05-08 DIAGNOSIS — R001 Bradycardia, unspecified: Secondary | ICD-10-CM | POA: Insufficient documentation

## 2014-05-30 ENCOUNTER — Ambulatory Visit: Payer: Medicare Other | Admitting: Cardiovascular Disease

## 2014-05-30 ENCOUNTER — Telehealth: Payer: Self-pay | Admitting: Cardiovascular Disease

## 2014-05-30 MED ORDER — AMLODIPINE BESYLATE 5 MG PO TABS: 5.0000 mg | ORAL_TABLET | Freq: Every day | ORAL | Status: DC

## 2014-05-30 NOTE — Telephone Encounter (Signed)
Refill submitted to patient's preferred pharmacy. Informed patient. Pt voiced understanding, no other stated concerns at this time.  

## 2014-05-30 NOTE — Telephone Encounter (Signed)
Pt need a new prescription for his Amlodipine 5 mg #90 and refills. Please call to [facility].

## 2014-06-02 DIAGNOSIS — I482 Chronic atrial fibrillation: Secondary | ICD-10-CM | POA: Diagnosis not present

## 2014-06-02 DIAGNOSIS — I10 Essential (primary) hypertension: Secondary | ICD-10-CM | POA: Diagnosis not present

## 2014-06-02 DIAGNOSIS — Z7901 Long term (current) use of anticoagulants: Secondary | ICD-10-CM | POA: Diagnosis not present

## 2014-06-06 ENCOUNTER — Telehealth: Payer: Self-pay | Admitting: Cardiovascular Disease

## 2014-06-06 DIAGNOSIS — L570 Actinic keratosis: Secondary | ICD-10-CM | POA: Diagnosis not present

## 2014-06-06 DIAGNOSIS — L821 Other seborrheic keratosis: Secondary | ICD-10-CM | POA: Diagnosis not present

## 2014-06-06 DIAGNOSIS — D239 Other benign neoplasm of skin, unspecified: Secondary | ICD-10-CM | POA: Diagnosis not present

## 2014-06-06 DIAGNOSIS — Z85828 Personal history of other malignant neoplasm of skin: Secondary | ICD-10-CM | POA: Diagnosis not present

## 2014-06-06 NOTE — Telephone Encounter (Signed)
Returned call to patient. He has been on amlodipine since 11/5 and is c/o bilateral ankle edema. He states he watches his salt. He reports his BP is usually 160F-093A systolic and has only been in the 140s a few times.   He states he had an old prescription for hydrochlorothiazide 25mg  and has been taking 1/2 tablet (12.5mg ) as needed for edema. He has taken this medication for the past 7 days with very little relief of his edema. Of note, this medication was NOT on his med list.   Informed patient I would have to defer this to Dr. Claiborne Billings to advise on management of BP, LE edema, and HCTZ.

## 2014-06-06 NOTE — Telephone Encounter (Signed)
Reduce amlodipine to 1/2 pill; give prescription for HCTZ 12.5 mg PRN swelling, but take daily until swelling resolves.

## 2014-06-06 NOTE — Telephone Encounter (Signed)
Tanner Ho is calling because his ankles is swollen , Dr. Claiborne Billings put him on Amlodipine and he thinks that may be causing him to swell . Please call   Thanks

## 2014-06-07 DIAGNOSIS — R600 Localized edema: Secondary | ICD-10-CM | POA: Diagnosis not present

## 2014-06-07 NOTE — Telephone Encounter (Signed)
Spoke with patient and provided med advice per Dr. Claiborne Billings. Medication list updated to reflect amlodipine dose decrease and added hctz prn. Patient voiced understanding of plan.

## 2014-06-30 DIAGNOSIS — I482 Chronic atrial fibrillation: Secondary | ICD-10-CM | POA: Diagnosis not present

## 2014-06-30 DIAGNOSIS — Z7901 Long term (current) use of anticoagulants: Secondary | ICD-10-CM | POA: Diagnosis not present

## 2014-07-28 DIAGNOSIS — I482 Chronic atrial fibrillation: Secondary | ICD-10-CM | POA: Diagnosis not present

## 2014-07-28 DIAGNOSIS — Z7901 Long term (current) use of anticoagulants: Secondary | ICD-10-CM | POA: Diagnosis not present

## 2014-08-25 DIAGNOSIS — J309 Allergic rhinitis, unspecified: Secondary | ICD-10-CM | POA: Diagnosis not present

## 2014-08-25 DIAGNOSIS — Z7901 Long term (current) use of anticoagulants: Secondary | ICD-10-CM | POA: Diagnosis not present

## 2014-08-25 DIAGNOSIS — I482 Chronic atrial fibrillation: Secondary | ICD-10-CM | POA: Diagnosis not present

## 2014-09-02 ENCOUNTER — Encounter: Payer: Self-pay | Admitting: *Deleted

## 2014-09-06 DIAGNOSIS — R42 Dizziness and giddiness: Secondary | ICD-10-CM | POA: Diagnosis not present

## 2014-09-14 DIAGNOSIS — L570 Actinic keratosis: Secondary | ICD-10-CM | POA: Diagnosis not present

## 2014-09-14 DIAGNOSIS — J340 Abscess, furuncle and carbuncle of nose: Secondary | ICD-10-CM | POA: Diagnosis not present

## 2014-09-22 DIAGNOSIS — I482 Chronic atrial fibrillation: Secondary | ICD-10-CM | POA: Diagnosis not present

## 2014-09-22 DIAGNOSIS — Z7901 Long term (current) use of anticoagulants: Secondary | ICD-10-CM | POA: Diagnosis not present

## 2014-09-23 IMAGING — CR DG CHEST 2V
2 series · 2 of 2 positions shown · non-contrast
Comparison: 04/14/2013

CLINICAL DATA: Shortness of breath, wheezing, history hypertension

EXAM:
CHEST  2 VIEW

[w chest pa]
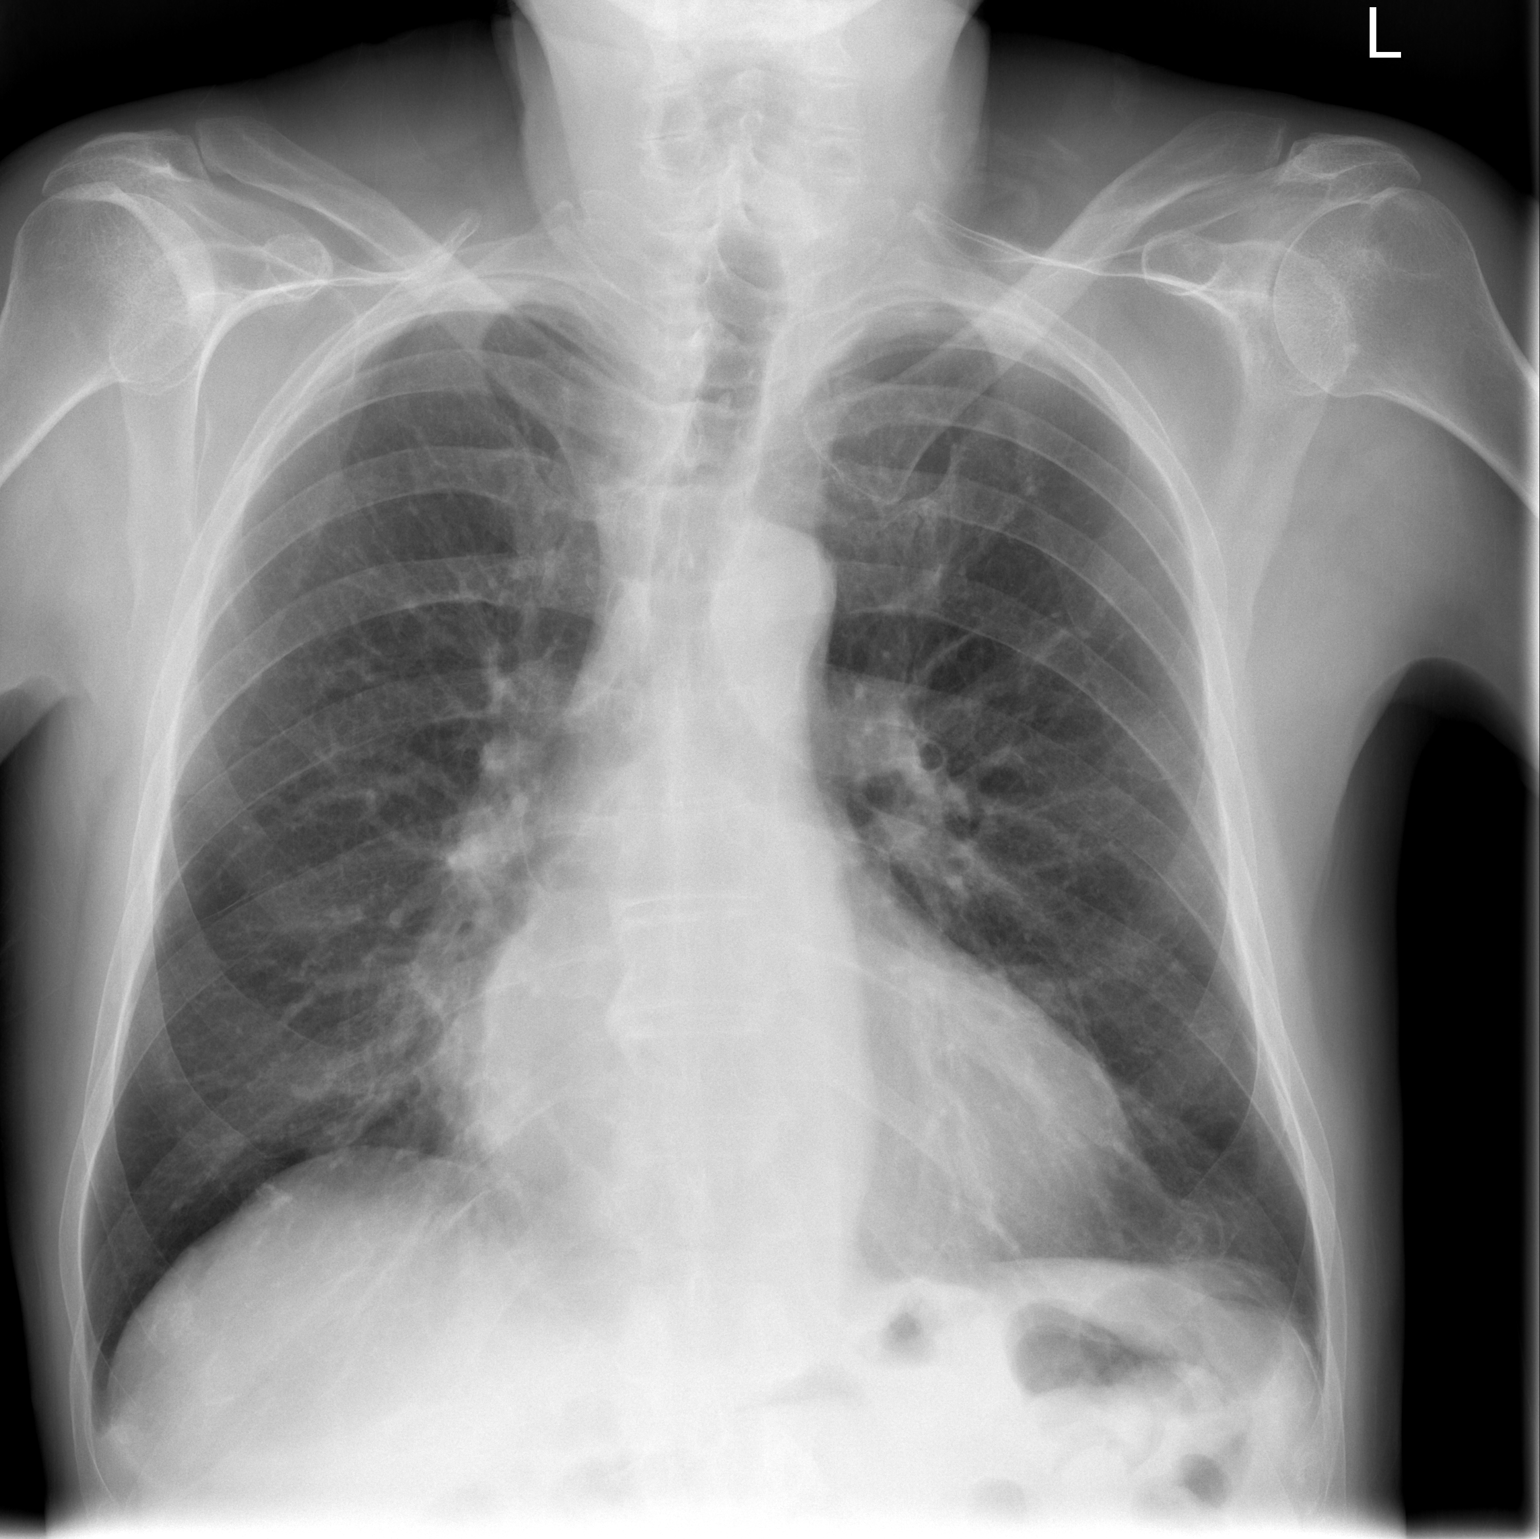

[w chest lat]
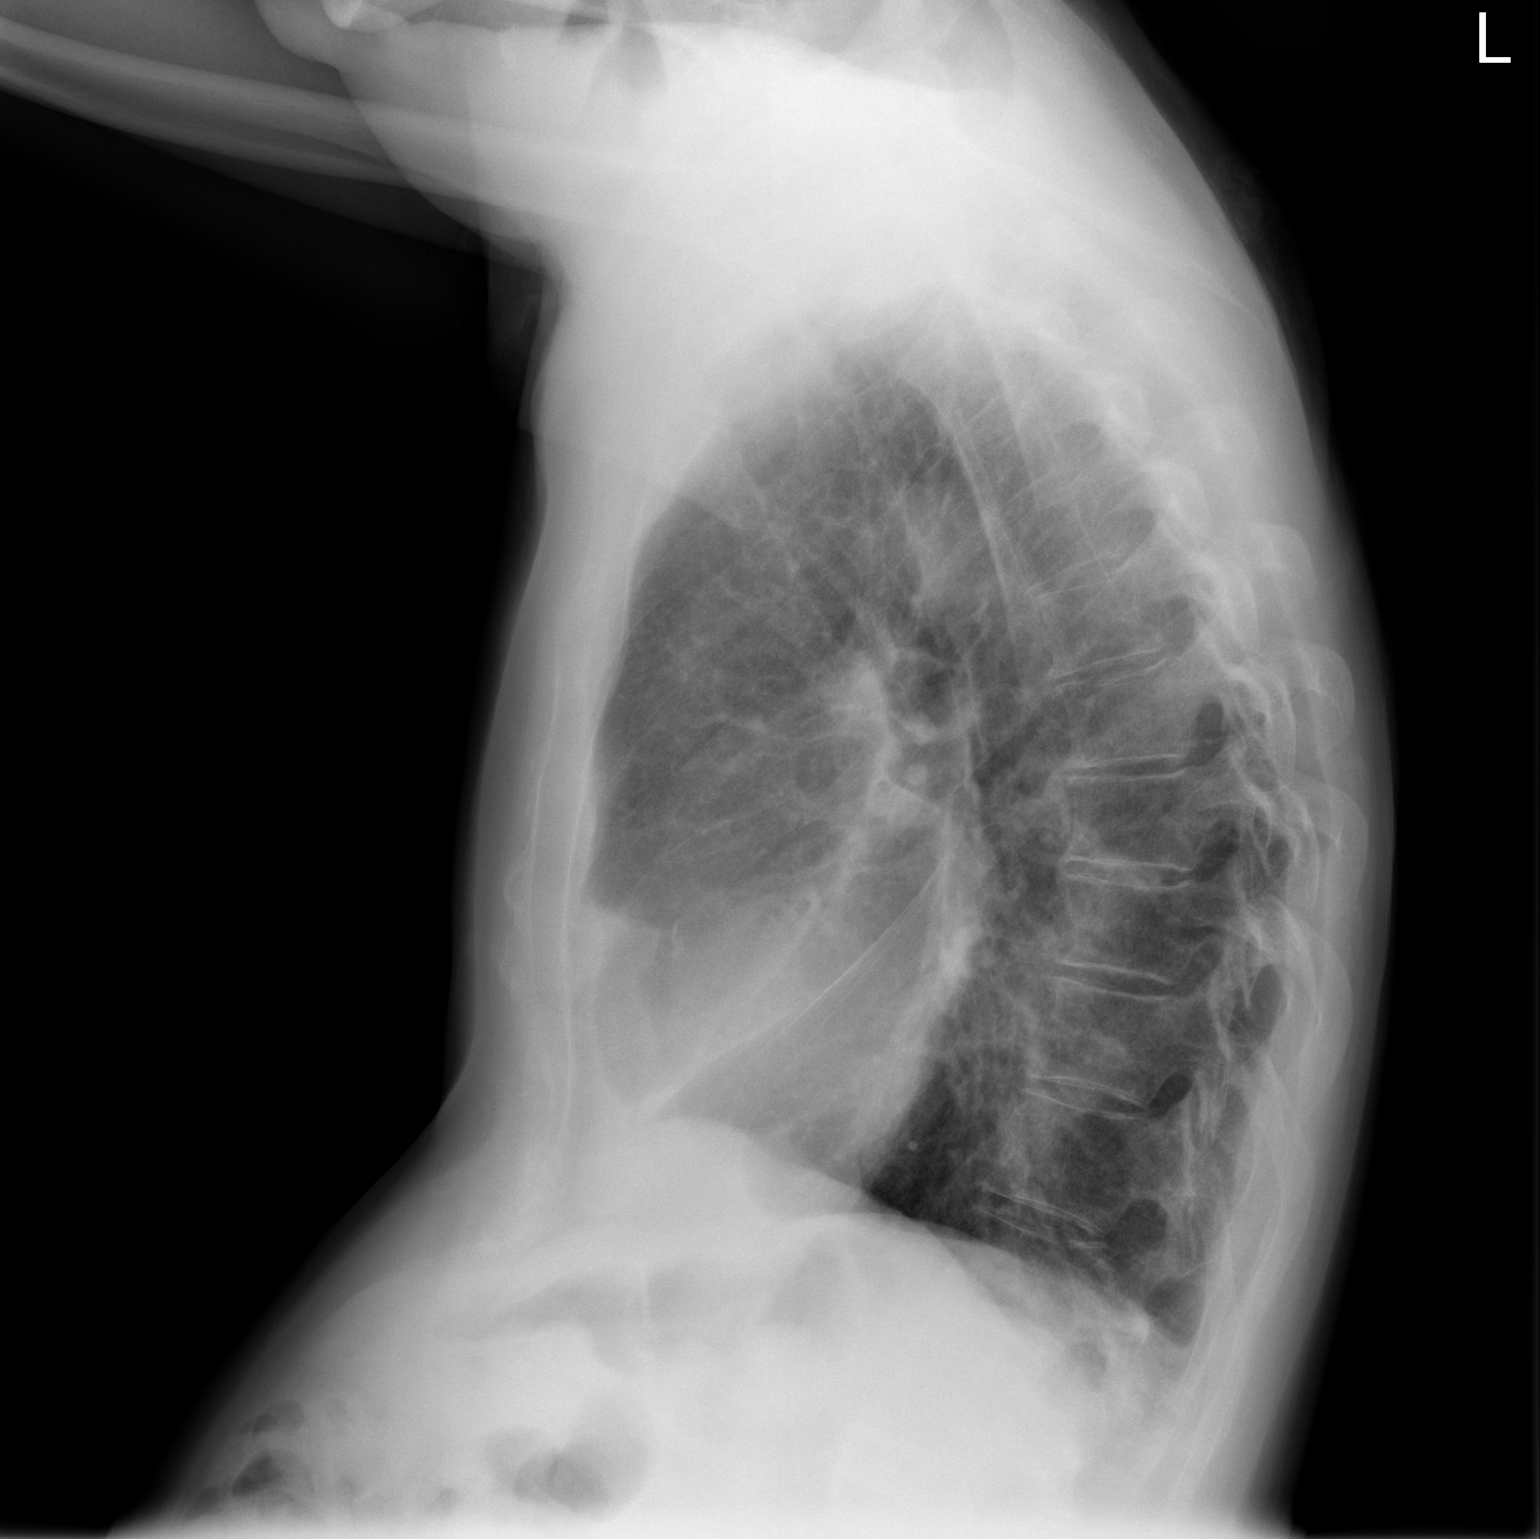

[2 of 2 positions shown; findings below may reference images not displayed]

FINDINGS: Mild enlargement of cardiac silhouette.

Mediastinal contours and pulmonary vascularity normal.

Chronic peribronchial thickening.

No pulmonary infiltrate, pleural effusion or pneumothorax.

No acute osseous findings.
IMPRESSION: Chronic bronchitic changes.

## 2014-10-06 DIAGNOSIS — I482 Chronic atrial fibrillation: Secondary | ICD-10-CM | POA: Diagnosis not present

## 2014-10-06 DIAGNOSIS — Z7901 Long term (current) use of anticoagulants: Secondary | ICD-10-CM | POA: Diagnosis not present

## 2014-10-15 ENCOUNTER — Other Ambulatory Visit: Payer: Self-pay | Admitting: Cardiology

## 2014-10-18 DIAGNOSIS — H26491 Other secondary cataract, right eye: Secondary | ICD-10-CM | POA: Diagnosis not present

## 2014-10-18 DIAGNOSIS — H3531 Nonexudative age-related macular degeneration: Secondary | ICD-10-CM | POA: Diagnosis not present

## 2014-11-03 DIAGNOSIS — I482 Chronic atrial fibrillation: Secondary | ICD-10-CM | POA: Diagnosis not present

## 2014-11-03 DIAGNOSIS — E785 Hyperlipidemia, unspecified: Secondary | ICD-10-CM | POA: Diagnosis not present

## 2014-11-03 DIAGNOSIS — Z7901 Long term (current) use of anticoagulants: Secondary | ICD-10-CM | POA: Diagnosis not present

## 2014-11-24 ENCOUNTER — Encounter: Payer: Self-pay | Admitting: Cardiovascular Disease

## 2014-11-24 DIAGNOSIS — H33302 Unspecified retinal break, left eye: Secondary | ICD-10-CM | POA: Diagnosis not present

## 2014-11-24 DIAGNOSIS — H43393 Other vitreous opacities, bilateral: Secondary | ICD-10-CM | POA: Diagnosis not present

## 2014-11-24 DIAGNOSIS — H3531 Nonexudative age-related macular degeneration: Secondary | ICD-10-CM | POA: Diagnosis not present

## 2014-11-28 DIAGNOSIS — Z7901 Long term (current) use of anticoagulants: Secondary | ICD-10-CM | POA: Diagnosis not present

## 2014-11-28 DIAGNOSIS — I482 Chronic atrial fibrillation: Secondary | ICD-10-CM | POA: Diagnosis not present

## 2014-12-01 ENCOUNTER — Ambulatory Visit (INDEPENDENT_AMBULATORY_CARE_PROVIDER_SITE_OTHER): Payer: Medicare Other | Admitting: Cardiovascular Disease

## 2014-12-01 ENCOUNTER — Encounter: Payer: Self-pay | Admitting: Cardiovascular Disease

## 2014-12-01 VITALS — BP 100/70 | HR 52 | Ht 68.0 in | Wt 160.0 lb

## 2014-12-01 DIAGNOSIS — E039 Hypothyroidism, unspecified: Secondary | ICD-10-CM

## 2014-12-01 DIAGNOSIS — I10 Essential (primary) hypertension: Secondary | ICD-10-CM

## 2014-12-01 DIAGNOSIS — I480 Paroxysmal atrial fibrillation: Secondary | ICD-10-CM

## 2014-12-01 DIAGNOSIS — Z7901 Long term (current) use of anticoagulants: Secondary | ICD-10-CM

## 2014-12-01 DIAGNOSIS — Z79899 Other long term (current) drug therapy: Secondary | ICD-10-CM

## 2014-12-01 DIAGNOSIS — R001 Bradycardia, unspecified: Secondary | ICD-10-CM

## 2014-12-01 DIAGNOSIS — N183 Chronic kidney disease, stage 3 unspecified: Secondary | ICD-10-CM

## 2014-12-01 DIAGNOSIS — E785 Hyperlipidemia, unspecified: Secondary | ICD-10-CM

## 2014-12-01 NOTE — Progress Notes (Signed)
Patient ID: Tanner Ho, male   DOB: May 16, 1941, 74 y.o.   MRN: [id number]     HPI: Tanner Ho is a 74 y.o. male who presents to the office today for a 7 month follow-up cardiology evaluation.   Tanner Ho has a history of mitral valve prolapse, hypertension, hyperlipidemia, hypothyroidism, as well as paroxysmal atrial fibrillation. He is on chronic Coumadin anticoagulation. His last documented atrial fibrillation episode was in October 2013. A nuclear perfusion study in October 2013 revealed normal perfusion imaging but he developed 1-2 mm of ST segment depression at peak stress test and the possibility of microvascular etiology leading to his ECG abnormalities. A cardiopulmonary met test on 09/17/2012 demonstarated a reduced peak maximum oxygen consumption at 58%. He had a suboptimal peak cardiovascular stress load making cardiovascular status interpretation indeterminate. He did have mild ventilation/perfusion mismatch suggesting impaired ulnar circulation plus minus increased dead space. He had a blunted chronotropic response to exercise. The test was limited by leg fatigue. When I saw him in the office subsequently I reduced his Cardizem from 240 to180 mg. At times, he notes a rare palpitation. He denies any awareness of breakthrough atrial fibrillation.   He presented to the emergency room in November 2014 with some vague chest pain. In retrospect, he feels this may have been GERD symptoms. Cardiac enzymes were negative. ECG was without changes. A nuclear perfusion study on 04/22/2013 was low risk and showed mild diaphragmatic attenuation but did not show a region of scar or ischemia, unchanged from his prior study of several years previously.  Last year he had noticed some dizziness and decreased energy   A cardiac monitor revealed episodes of PACs, bradycardia, with heart rates down to 48, but he also had a 6 beat run of wide-complex tachycardia at a rate of 131.  Remotely, he had developed  breakthrough atrial fibrillation on amiodarone 100 mg and he has been on 100 mg, alternating with 200 mg daily.  On this most recent monitor, there were no episodes of recurrent AF.   I last saw him, he has been on amiodarone 100 mg daily, Norvasc 2.5 mg, HCTZ 12.5 mg daily, Toprol-XL 12.5 mg daily, Crestor 5 mg and Coumadin.  He also has noticed rare episodes of vertigo for which he takes meclizine.  He is on losartan 50 g twice a day as well as fish oil.  He also has been on Uroxatral to improve urinary flow.  He states at times his blood pressure does get low and very rarely his pulse may drop below 50.  He presents for evaluation.  Past Medical History  Diagnosis Date  . Hypertension   . GERD (gastroesophageal reflux disease)   . MVP (mitral valve prolapse)     on cardiac cath  . Hyperlipidemia LDL goal < 100   . PAF (paroxysmal atrial fibrillation)     last episode 03/2012  . Chronic anticoagulation     on coumadin followed by Tanner Gault, PhD  . H/O cardiovascular stress test 03/2012    EKG with 1-2 mm ST segment depression but normal perfusion images. Followup metastases test reduced exercise effort and functional status and determinate for myocardial dysfunction  . Atrial fibrillation   . Chronic renal insufficiency   . Hypothyroidism   . Asthma   . Macular degeneration   . BPH (benign prostatic hyperplasia)   . Pericarditis   . Adrenal mass   . CHF (congestive heart failure)     Past Surgical History  Procedure Laterality Date  . Abdominal vascular ultrasound evaluation  12/20/2008    mild non-obstructive plaque, abdominal aorta, with mild calcification at the bifurcation-no obstruction, no evidence for aneurysm of the abdominal aorta  . Cardiac catheterization  01/04/1999    hyperdynamic LV function, probable angiographic mitral valve prolapse, systolic muscle bridging of the mid LAD without obstructive disease, systolic narrowing up to 70%  . Cardiopulmonary met-test   07/20/2012    reduced exercise effort and functional status, peak max O2 comsumption was only 58% of predicted, cardiovascular status was interpreted as indeterminate for myocardial dysfunction due to the suboptimal peak cardiovascular stress load.    Allergies  Allergen Reactions  . Ciprofloxacin Other (See Comments)    Cannot take due to currently taking Amiodarone  . Hytrin [Terazosin] Other (See Comments)    Syncopal event     Current Outpatient Prescriptions  Medication Sig Dispense Refill  . acetaminophen (TYLENOL) 500 MG tablet Take 500 mg by mouth every 6 (six) hours as needed for moderate pain.    Marland Kitchen alfuzosin (UROXATRAL) 10 MG 24 hr tablet Take 10 mg by mouth daily.    Marland Kitchen amiodarone (PACERONE) 200 MG tablet Take 100 mg by mouth daily.     Marland Kitchen amLODipine (NORVASC) 5 MG tablet Take 2.5 tablets by mouth daily.  6  . Calcium Citrate (CITRACAL PO) Take 2 capsules by mouth.     . Cholecalciferol (VITAMIN D-3) 1000 UNITS CAPS Take 1 capsule by mouth 2 (two) times daily.    . fish oil-omega-3 fatty acids 1000 MG capsule Take 1 g by mouth 2 (two) times daily.    Marland Kitchen FLOVENT HFA 110 MCG/ACT inhaler as needed. 1 puff in he am and 1 puff in the pm    . hydrochlorothiazide (HYDRODIURIL) 25 MG tablet Take 12.5 mg by mouth daily as needed (PRN swelling.).    Marland Kitchen levalbuterol (XOPENEX HFA) 45 MCG/ACT inhaler Inhale 1 puff into the lungs every 4 (four) hours as needed for wheezing. (Patient taking differently: Inhale 1 puff into the lungs as needed for wheezing. ) 1 Inhaler 12  . levothyroxine (SYNTHROID, LEVOTHROID) 50 MCG tablet Take 1 tablet by mouth daily.  2  . losartan (COZAAR) 50 MG tablet TAKE 1 TABLET BY MOUTH TWICE A DAY 60 tablet 6  . magnesium hydroxide (MILK OF MAGNESIA) 800 MG/5ML suspension Take 5 mLs by mouth daily as needed for constipation.    . meclizine (ANTIVERT) 25 MG tablet Take 25 mg by mouth as needed.  2  . Multiple Vitamins-Minerals (PRESERVISION AREDS) CAPS Take 1 capsule by  mouth 2 (two) times daily. VITAMIN A FREE    . OVER THE COUNTER MEDICATION Apply 1 application topically daily.    Vladimir Faster Glycol-Propyl Glycol (SYSTANE) 0.4-0.3 % SOLN Apply 2 drops to eye daily as needed (for dry eeys).    . polyethylene glycol (MIRALAX / GLYCOLAX) packet Take 17 g by mouth daily.    . rosuvastatin (CRESTOR) 5 MG tablet Take 5 mg by mouth daily at 6 PM.     . warfarin (COUMADIN) 5 MG tablet Take 2.5-5 mg by mouth daily. Takes 2.42m on thursdays Takes 589mall other days     No current facility-administered medications for this visit.    History   Social History  . Marital Status: Married    Spouse Name: N/A  . Number of Children: N/A  . Years of Education: N/A   Occupational History  . Not on file.   Social History  Main Topics  . Smoking status: Never Smoker   . Smokeless tobacco: Never Used  . Alcohol Use: No  . Drug Use: No  . Sexual Activity: Not on file   Other Topics Concern  . Not on file   Social History Narrative    Social history is notable in that he is married. 2 children 4 grandchildren. No tobacco alcohol use.  ROS General: Negative; No fevers, chills, or night sweats;  HEENT: Negative; No changes in vision or hearing, sinus congestion, difficulty swallowing Pulmonary: Negative; No cough, wheezing, shortness of breath, hemoptysis Cardiovascular: Negative; No chest pain, presyncope, syncope, palpitations Edema has improved GI: Positive for GERD; No nausea, vomiting, diarrhea, or abdominal pain GU: Negative; No dysuria, hematuria, or difficulty voiding Musculoskeletal: Negative; no myalgias, joint pain, or weakness Hematologic/Oncology: Negative; no easy bruising, bleeding Endocrine: Negative; no heat/cold intolerance; no diabetes Neuro: Negative; no changes in balance, headaches Skin: Negative; No rashes or skin lesions Psychiatric: Negative; No behavioral problems, depression Sleep: Negative; No snoring, daytime sleepiness,  hypersomnolence, bruxism, restless legs, hypnogognic hallucinations, no cataplexy Other comprehensive 14 point system review is negative.   PE BP 100/70 mmHg  Pulse 52  Ht 5' 8" (1.727 m)  Wt 160 lb (72.576 kg)  BMI 24.33 kg/m2   Wt Readings from Last 3 Encounters:  12/01/14 160 lb (72.576 kg)  05/06/14 158 lb 11.2 oz (71.986 kg)  03/03/14 158 lb 11.2 oz (71.986 kg)   General: Alert, oriented, no distress.  Skin: normal turgor, no rashes HEENT: Normocephalic, atraumatic. Pupils round and reactive; sclera anicteric;no lid lag.  Nose without nasal septal hypertrophy Mouth/Parynx benign; Mallinpatti scale 3 Neck: No JVD, no carotid bruits with normal carotid upstroke No chest wall tenderness to palpation Lungs: clear to ausculatation and percussion; no wheezing or rales Heart: RRR, s1 s2 normal, with a 1/6 systolic murmur.  No diastolic murmur.  No rubs, thrills or heaves Abdomen: soft, nontender; no hepatosplenomehaly, BS+; abdominal aorta nontender and not dilated by palpation. Back: No CVA tenderness Pulses 2+ Extremities: Resolution of prior edema, no clubbing cyanosis, Homan's sign negative  Neurologic: grossly nonfocal Psychological: Normal cognitive function, normal affect and mood  ECG (independently read by me): Sinus bradycardia 52 bpm.  QTc interval 407 ms.  PR interval 158 msec  December 2015 ECG (independently read by me): Sinus bradycardia at 49 bpm.  Nonspecific ST changes.  QTc interval 424 ms.  ECG (independently read by me): Sinus bradycardia 50 beats per minute.  Nonspecific ST changes.    Prior ECG: Sinus bradycardia at 50 beats per minute. No ectopy on ECG. Intervals normal  LABS: BMP Latest Ref Rng 01/05/2014 04/14/2013 03/13/2012  Glucose 70 - 99 mg/dL 98 128(H) 111(H)  BUN 6 - 23 mg/dL _0 Creatinine 0.50 - 1.35 mg/dL 1.46(H) 1.26 1.29  Sodium 135 - 145 mEq/L 141 142 142  Potassium 3.5 - 5.3 mEq/L 4.6 4.5 4.4  Chloride 96 - 112 mEq/L 105 105 108   CO2 19 - 32 mEq/L _1 Calcium 8.4 - 10.5 mg/dL 9.4 9.1 9.3   Hepatic Function Latest Ref Rng 01/05/2014  Total Protein 6.0 - 8.3 g/dL 6.7  Albumin 3.5 - 5.2 g/dL 4.4  AST 0 - 37 U/L 15  ALT 0 - 53 U/L 15  Alk Phosphatase 39 - 117 U/L 107  Total Bilirubin 0.2 - 1.2 mg/dL 0.4   CBC Latest Ref Rng 01/05/2014 04/14/2013 03/13/2012  WBC 4.0 - 10.5 K/uL 5.2 4.2  4.8  Hemoglobin 13.0 - 17.0 g/dL 13.9 13.7 15.3  Hematocrit 39.0 - 52.0 % 39.7 40.1 42.8  Platelets 150 - 400 K/uL 155 135(L) 126(L)   Lab Results  Component Value Date   MCV 98.3 01/05/2014   MCV 102.0* 04/14/2013   MCV 99.3 03/13/2012   Lab Results  Component Value Date   TSH 3.284 03/13/2012   Lipid Panel  No results found for: CHOL, TRIG, HDL, CHOLHDL, VLDL, LDLCALC, LDLDIRECT   RADIOLOGY: No results found.    ASSESSMENT AND PLAN: Ms. Ginsberg is a 74 year old gentleman who has a history of hypertension, hyperlipidemia, hypothyroidism, mitral valve prolapse, as well as paroxysmal atrial fibrillation.  He continues to maintain sinus rhythm without recurrent atrial fibrillation.  His blood pressure is stable, but on the low side.  He is not having any signs of recurrent lower extremity edema.  He is bradycardic with a pulse 52.  Presently, I am recommending he be ultimately discontinue the Toprol-XL.  He is currently taking 12.5 mg daily and he will change this to 12.5 mg every other day for 2 doses and then he will discontinue this.  I will maintain him on amiodarone 100 mg daily since in the past.  He had developed recurrent AF.  He no longer is having edema and I will also recommend he change his HCTZ from daily to every other day.  I am recommending that laboratory be obtained in the fasting state.  Adjustments to his medical regimen will be made if needed.  As long as he remains stable, I will see him in 6 months for reevaluation.  Time spent: 25 minutes Troy Sine, MD, [facility]  12/01/2014 10:17 PM

## 2014-12-01 NOTE — Patient Instructions (Signed)
Your physician recommends that you return for lab work FASTING.  Your physician has recommended you make the following change in your medication: take the metoprolol succ every other day for 2 doses then STOP.  Your physician wants you to follow-up in: 6 months. You will receive a reminder letter in the mail two months in advance. If you don't receive a letter, please call our office to schedule the follow-up appointment.

## 2014-12-06 DIAGNOSIS — H3531 Nonexudative age-related macular degeneration: Secondary | ICD-10-CM | POA: Diagnosis not present

## 2014-12-06 DIAGNOSIS — Z961 Presence of intraocular lens: Secondary | ICD-10-CM | POA: Diagnosis not present

## 2014-12-06 DIAGNOSIS — L718 Other rosacea: Secondary | ICD-10-CM | POA: Diagnosis not present

## 2014-12-06 DIAGNOSIS — H43393 Other vitreous opacities, bilateral: Secondary | ICD-10-CM | POA: Diagnosis not present

## 2014-12-19 ENCOUNTER — Ambulatory Visit: Payer: Medicare Other | Admitting: Cardiovascular Disease

## 2014-12-20 DIAGNOSIS — H3532 Exudative age-related macular degeneration: Secondary | ICD-10-CM | POA: Diagnosis not present

## 2014-12-20 DIAGNOSIS — H5410 Blindness, one eye, low vision other eye, unspecified eyes: Secondary | ICD-10-CM | POA: Diagnosis not present

## 2014-12-26 ENCOUNTER — Telehealth: Payer: Self-pay | Admitting: Cardiovascular Disease

## 2014-12-26 MED ORDER — ROSUVASTATIN CALCIUM 5 MG PO TABS: 5.0000 mg | ORAL_TABLET | Freq: Every day | ORAL | Status: DC

## 2014-12-26 NOTE — Telephone Encounter (Signed)
Pt called stating that Dr.Kelly took him off of Metoprolol due to his low heart rate. He would like to know when he could start back taking it. Please call   1. Which medications need to be refilled? Crestor 5mg    2. Which pharmacy is medication to be sent to?[facility] on [address]  3. Do they need a 30 day or 90 day supply? 30  4. Would they like a call back once the medication has been sent to the pharmacy? Yes

## 2014-12-26 NOTE — Telephone Encounter (Signed)
Refill submitted to patient's preferred pharmacy. Informed patient. Pt voiced understanding, no other stated concerns at this time.  Pt wanted to make sure HR of 65 OK - has been around this since he was d/c'ed from Toprol.  Advised OK - continue to check, let us be aware of any problems/symptoms if he feels he is having A Fib/skipped beats/palpitations.  Pt voiced understanding, thanks for the return call.

## 2014-12-28 DIAGNOSIS — I10 Essential (primary) hypertension: Secondary | ICD-10-CM | POA: Diagnosis not present

## 2014-12-28 DIAGNOSIS — Z79899 Other long term (current) drug therapy: Secondary | ICD-10-CM | POA: Diagnosis not present

## 2014-12-28 DIAGNOSIS — E039 Hypothyroidism, unspecified: Secondary | ICD-10-CM | POA: Diagnosis not present

## 2014-12-28 DIAGNOSIS — I4891 Unspecified atrial fibrillation: Secondary | ICD-10-CM | POA: Diagnosis not present

## 2014-12-28 DIAGNOSIS — E785 Hyperlipidemia, unspecified: Secondary | ICD-10-CM | POA: Diagnosis not present

## 2014-12-28 DIAGNOSIS — I480 Paroxysmal atrial fibrillation: Secondary | ICD-10-CM | POA: Diagnosis not present

## 2014-12-29 DIAGNOSIS — R5383 Other fatigue: Secondary | ICD-10-CM | POA: Diagnosis not present

## 2014-12-29 DIAGNOSIS — I4891 Unspecified atrial fibrillation: Secondary | ICD-10-CM | POA: Diagnosis not present

## 2014-12-29 DIAGNOSIS — E559 Vitamin D deficiency, unspecified: Secondary | ICD-10-CM | POA: Diagnosis not present

## 2014-12-29 DIAGNOSIS — Z7901 Long term (current) use of anticoagulants: Secondary | ICD-10-CM | POA: Diagnosis not present

## 2014-12-29 LAB — COMPREHENSIVE METABOLIC PANEL
ALBUMIN: 3.7 g/dL (ref 3.6–5.1)
ALT: 15 U/L (ref 9–46)
AST: 17 U/L (ref 10–35)
Alkaline Phosphatase: 117 U/L — ABNORMAL HIGH (ref 40–115)
BILIRUBIN TOTAL: 0.6 mg/dL (ref 0.2–1.2)
BUN: 21 mg/dL (ref 7–25)
CHLORIDE: 103 meq/L (ref 98–110)
CO2: 26 mEq/L (ref 20–31)
CREATININE: 1.4 mg/dL — AB (ref 0.70–1.18)
Calcium: 9 mg/dL (ref 8.6–10.3)
Glucose, Bld: 94 mg/dL (ref 65–99)
Potassium: 4.6 mEq/L (ref 3.5–5.3)
Sodium: 139 mEq/L (ref 135–146)
Total Protein: 6.2 g/dL (ref 6.1–8.1)

## 2014-12-29 LAB — CBC
HCT: 39.5 % (ref 39.0–52.0)
Hemoglobin: 13.3 g/dL (ref 13.0–17.0)
MCH: 33.7 pg (ref 26.0–34.0)
MCHC: 33.7 g/dL (ref 30.0–36.0)
MCV: 100 fL (ref 78.0–100.0)
MPV: 9 fL (ref 8.6–12.4)
Platelets: 173 10*3/uL (ref 150–400)
RBC: 3.95 MIL/uL — AB (ref 4.22–5.81)
RDW: 14 % (ref 11.5–15.5)
WBC: 4.7 10*3/uL (ref 4.0–10.5)

## 2014-12-29 LAB — LIPID PANEL
CHOL/HDL RATIO: 2.4 ratio (ref <=5.0)
CHOLESTEROL: 120 mg/dL — AB (ref 125–200)
HDL: 49 mg/dL (ref >=40)
LDL Cholesterol: 54 mg/dL (ref <130)
TRIGLYCERIDES: 85 mg/dL (ref <150)
VLDL: 17 mg/dL (ref <30)

## 2014-12-29 LAB — TSH: TSH: 2.25 u[IU]/mL (ref 0.350–4.500)

## 2015-01-03 ENCOUNTER — Other Ambulatory Visit: Payer: Self-pay | Admitting: *Deleted

## 2015-01-03 MED ORDER — LOSARTAN POTASSIUM 50 MG PO TABS: 50.0000 mg | ORAL_TABLET | Freq: Two times a day (BID) | ORAL | Status: DC

## 2015-01-05 DIAGNOSIS — R5383 Other fatigue: Secondary | ICD-10-CM | POA: Diagnosis not present

## 2015-01-05 DIAGNOSIS — E559 Vitamin D deficiency, unspecified: Secondary | ICD-10-CM | POA: Diagnosis not present

## 2015-01-05 DIAGNOSIS — Z79899 Other long term (current) drug therapy: Secondary | ICD-10-CM | POA: Diagnosis not present

## 2015-02-02 DIAGNOSIS — I482 Chronic atrial fibrillation: Secondary | ICD-10-CM | POA: Diagnosis not present

## 2015-02-02 DIAGNOSIS — Z7901 Long term (current) use of anticoagulants: Secondary | ICD-10-CM | POA: Diagnosis not present

## 2015-02-19 ENCOUNTER — Other Ambulatory Visit: Payer: Self-pay | Admitting: Cardiovascular Disease

## 2015-02-20 NOTE — Telephone Encounter (Signed)
Rx request sent to pharmacy.  

## 2015-03-02 DIAGNOSIS — I482 Chronic atrial fibrillation: Secondary | ICD-10-CM | POA: Diagnosis not present

## 2015-03-02 DIAGNOSIS — Z7901 Long term (current) use of anticoagulants: Secondary | ICD-10-CM | POA: Diagnosis not present

## 2015-03-02 DIAGNOSIS — Z23 Encounter for immunization: Secondary | ICD-10-CM | POA: Diagnosis not present

## 2015-03-14 ENCOUNTER — Telehealth: Payer: Self-pay | Admitting: Cardiovascular Disease

## 2015-03-14 MED ORDER — ROSUVASTATIN CALCIUM 5 MG PO TABS: 5.0000 mg | ORAL_TABLET | Freq: Every day | ORAL | Status: DC

## 2015-03-14 NOTE — Telephone Encounter (Signed)
Generic crestor refilled to [facility] per patient request

## 2015-03-14 NOTE — Telephone Encounter (Signed)
Pt wants to know if he can switch to the generic Crestor?

## 2015-03-18 ENCOUNTER — Other Ambulatory Visit: Payer: Self-pay | Admitting: Cardiovascular Disease

## 2015-03-23 DIAGNOSIS — H353134 Nonexudative age-related macular degeneration, bilateral, advanced atrophic with subfoveal involvement: Secondary | ICD-10-CM | POA: Diagnosis not present

## 2015-03-23 DIAGNOSIS — H43812 Vitreous degeneration, left eye: Secondary | ICD-10-CM | POA: Diagnosis not present

## 2015-03-23 DIAGNOSIS — H43811 Vitreous degeneration, right eye: Secondary | ICD-10-CM | POA: Diagnosis not present

## 2015-03-30 ENCOUNTER — Telehealth: Payer: Self-pay | Admitting: Cardiovascular Disease

## 2015-03-30 DIAGNOSIS — I482 Chronic atrial fibrillation: Secondary | ICD-10-CM | POA: Diagnosis not present

## 2015-03-30 DIAGNOSIS — Z7901 Long term (current) use of anticoagulants: Secondary | ICD-10-CM | POA: Diagnosis not present

## 2015-03-30 NOTE — Telephone Encounter (Signed)
Ok to switch to xarelto 20 mg qd with meal or eliquis 5 mg bid, whichever his insurance will cover.  He will need to have INR checked before switching.  Have him arrange how to transition with whomever checks his INRs

## 2015-03-30 NOTE — Telephone Encounter (Signed)
Pt wants to switch from Warfarin to one of the new ones.Can he do this?

## 2015-03-30 NOTE — Telephone Encounter (Signed)
No answer when dialed. Left message advising he is welcome to call back - I will route question to pharmacist for review.  Intended to find out who does his coumadin checks - will see what Tanner Ho recommends.

## 2015-04-03 NOTE — Telephone Encounter (Signed)
Called patient left message for patient to return call 

## 2015-04-14 ENCOUNTER — Other Ambulatory Visit: Payer: Self-pay

## 2015-04-14 ENCOUNTER — Telehealth: Payer: Self-pay | Admitting: Cardiovascular Disease

## 2015-04-14 ENCOUNTER — Telehealth: Payer: Self-pay | Admitting: Pharmacist Clinician (PhC)/ Clinical Pharmacy Specialist

## 2015-04-14 MED ORDER — AMLODIPINE BESYLATE 2.5 MG PO TABS: 2.5000 mg | ORAL_TABLET | Freq: Every day | ORAL | Status: DC

## 2015-04-14 NOTE — Telephone Encounter (Signed)
Returned call to patient,Amlodipine 2.5 mg daily 90 day refill sent to pharmacy.

## 2015-04-14 NOTE — Telephone Encounter (Signed)
Pt called in requesting that he receive a new prescription for Amlodipine 2.5mg  because when he splits the 5mg  in half, it makes a mess. Please f/u with her  Thanks

## 2015-04-14 NOTE — Telephone Encounter (Signed)
Pt called with questions about Eliquis vs Xarelto.  Reviewed both medications, patient has been looking over Medicare D plans for 2017 to figure out pricing.  Decided to go with Eliquis, dose will be 5 mg daily (Wt >60 kg, SCr 1.4, age 74).  He will call around January 1 to let us know where to send prescription.  Until then has coumadin dosed by Caryn at [place] him that she can help him transition from one to the other.

## 2015-04-20 DIAGNOSIS — H353124 Nonexudative age-related macular degeneration, left eye, advanced atrophic with subfoveal involvement: Secondary | ICD-10-CM | POA: Diagnosis not present

## 2015-04-20 DIAGNOSIS — H353114 Nonexudative age-related macular degeneration, right eye, advanced atrophic with subfoveal involvement: Secondary | ICD-10-CM | POA: Diagnosis not present

## 2015-04-20 DIAGNOSIS — H04123 Dry eye syndrome of bilateral lacrimal glands: Secondary | ICD-10-CM | POA: Diagnosis not present

## 2015-04-20 DIAGNOSIS — Z961 Presence of intraocular lens: Secondary | ICD-10-CM | POA: Diagnosis not present

## 2015-04-26 DIAGNOSIS — R338 Other retention of urine: Secondary | ICD-10-CM | POA: Diagnosis not present

## 2015-04-26 DIAGNOSIS — N261 Atrophy of kidney (terminal): Secondary | ICD-10-CM | POA: Diagnosis not present

## 2015-04-26 DIAGNOSIS — N401 Enlarged prostate with lower urinary tract symptoms: Secondary | ICD-10-CM | POA: Diagnosis not present

## 2015-05-01 DIAGNOSIS — Z7901 Long term (current) use of anticoagulants: Secondary | ICD-10-CM | POA: Diagnosis not present

## 2015-05-01 DIAGNOSIS — I482 Chronic atrial fibrillation: Secondary | ICD-10-CM | POA: Diagnosis not present

## 2015-05-08 ENCOUNTER — Other Ambulatory Visit: Payer: Self-pay | Admitting: Cardiology

## 2015-05-09 NOTE — Telephone Encounter (Signed)
Rx request sent to pharmacy.  

## 2015-05-18 ENCOUNTER — Other Ambulatory Visit: Payer: Self-pay | Admitting: Cardiovascular Disease

## 2015-05-18 NOTE — Telephone Encounter (Signed)
Rx(s) sent to pharmacy electronically.  

## 2015-05-24 ENCOUNTER — Ambulatory Visit (INDEPENDENT_AMBULATORY_CARE_PROVIDER_SITE_OTHER): Payer: Medicare Other | Admitting: Cardiovascular Disease

## 2015-05-24 ENCOUNTER — Encounter: Payer: Self-pay | Admitting: Cardiovascular Disease

## 2015-05-24 VITALS — BP 120/56 | HR 61 | Ht 68.0 in | Wt 163.0 lb

## 2015-05-24 DIAGNOSIS — I480 Paroxysmal atrial fibrillation: Secondary | ICD-10-CM

## 2015-05-24 DIAGNOSIS — R600 Localized edema: Secondary | ICD-10-CM | POA: Diagnosis not present

## 2015-05-24 DIAGNOSIS — R42 Dizziness and giddiness: Secondary | ICD-10-CM

## 2015-05-24 DIAGNOSIS — Z7901 Long term (current) use of anticoagulants: Secondary | ICD-10-CM

## 2015-05-24 DIAGNOSIS — I10 Essential (primary) hypertension: Secondary | ICD-10-CM | POA: Diagnosis not present

## 2015-05-24 DIAGNOSIS — E785 Hyperlipidemia, unspecified: Secondary | ICD-10-CM

## 2015-05-24 NOTE — Progress Notes (Signed)
Patient ID: Tanner Ho, male   DOB: [birth year], 74 y.o.   MRN: [id number]    Primary M.D.: Dr. Deland Ho  HPI: Tanner Ho is a 74 y.o. male who presents to the office today for a 6 month follow-up cardiology evaluation.   Tanner Ho has a history of mitral valve prolapse, hypertension, hyperlipidemia, hypothyroidism, as well as paroxysmal atrial fibrillation. He is on chronic Coumadin anticoagulation. His last documented atrial fibrillation episode was in October 2013. A nuclear perfusion study in October 2013 revealed normal perfusion imaging but he developed 1-2 mm of ST segment depression at peak stress test and the possibility of microvascular etiology leading to his ECG abnormalities. A cardiopulmonary met test on 09/17/2012 demonstarated a reduced peak maximum oxygen consumption at 58%. He had a suboptimal peak cardiovascular stress load making cardiovascular status interpretation indeterminate. He did have mild ventilation/perfusion mismatch suggesting impaired ulnar circulation plus minus increased dead space. He had a blunted chronotropic response to exercise. The test was limited by leg fatigue. When I saw him in the office subsequently I reduced his Cardizem from 240 to180 mg. At times, he notes a rare palpitation. He denies any awareness of breakthrough atrial fibrillation.   He presented to the emergency room in November 2014 with some vague chest pain. In retrospect, he feels this may have been GERD symptoms. Cardiac enzymes were negative. ECG was without changes. A nuclear perfusion study on 04/22/2013 was low risk and showed mild diaphragmatic attenuation but did not show a region of scar or ischemia, unchanged from his prior study of several years previously.  In 2015 he had noticed some dizziness and decreased energy   A cardiac monitor revealed episodes of PACs, bradycardia, with heart rates down to 48, but he also had a 6 beat run of wide-complex tachycardia at a rate of 131.   Remotely, he had developed breakthrough atrial fibrillation on amiodarone 100 mg and he has been on 100 mg, alternating with 200 mg daily.  On this most recent monitor, there were no episodes of recurrent AF.   He has been on amiodarone 100 mg daily, Norvasc 2.5 mg, losartan 50 mg bid, HCTZ 12.5 mg daily, Toprol-XL 12.5 mg daily, Crestor 5 mg and Coumadin.  He also has noticed rare episodes of vertigo for which he takes meclizine.    He also has been on Uroxatral to improve urinary flow.  Since I last saw him, he has felt well.  He has been contemplating whether or not to switch from warfarin to one of the newer oral anticoagulants and was wanting to know my advice.  He denies any chest pain.  Is unaware of rate or palpitations.  He presents for evaluation.  Past Medical History  Diagnosis Date  . Hypertension   . GERD (gastroesophageal reflux disease)   . MVP (mitral valve prolapse)     on cardiac cath  . Hyperlipidemia LDL goal < 100   . PAF (paroxysmal atrial fibrillation) (Tanner Ho)     last episode 03/2012  . Chronic anticoagulation     on coumadin followed by Tanner Gault, PhD  . H/O cardiovascular stress test 03/2012    EKG with 1-2 mm ST segment depression but normal perfusion images. Followup metastases test reduced exercise effort and functional status and determinate for myocardial dysfunction  . Atrial fibrillation (Tanner Ho)   . Chronic renal insufficiency   . Hypothyroidism   . Asthma   . Macular degeneration   . BPH (benign prostatic hyperplasia)   .  Pericarditis   . Adrenal mass (Tanner Ho)   . CHF (congestive heart failure) Tanner Ho)     Past Surgical History  Procedure Laterality Date  . Abdominal vascular ultrasound evaluation  12/20/2008    mild non-obstructive plaque, abdominal aorta, with mild calcification at the bifurcation-no obstruction, no evidence for aneurysm of the abdominal aorta  . Cardiac catheterization  01/04/1999    hyperdynamic LV function, probable angiographic mitral  valve prolapse, systolic muscle bridging of the mid LAD without obstructive disease, systolic narrowing up to 96%  . Cardiopulmonary met-test  07/20/2012    reduced exercise effort and functional status, peak max O2 comsumption was only 58% of predicted, cardiovascular status was interpreted as indeterminate for myocardial dysfunction due to the suboptimal peak cardiovascular stress load.    Allergies  Allergen Reactions  . Ciprofloxacin Other (See Comments)    Cannot take due to currently taking Amiodarone  . Hytrin [Terazosin] Other (See Comments)    Syncopal event     Current Outpatient Prescriptions  Medication Sig Dispense Refill  . acetaminophen (TYLENOL 8 HOUR ARTHRITIS PAIN) 650 MG CR tablet Take 650 mg by mouth every 8 (eight) hours as needed for pain.    Marland Kitchen alfuzosin (UROXATRAL) 10 MG 24 hr tablet Take 10 mg by mouth daily.    Marland Kitchen amiodarone (PACERONE) 100 MG tablet Take 100 mg by mouth daily.    Marland Kitchen amLODipine (NORVASC) 2.5 MG tablet Take 1 tablet (2.5 mg total) by mouth daily. 90 tablet 3  . Calcium Citrate (CITRACAL PO) Take 2 capsules by mouth.     . Cholecalciferol (VITAMIN D-3) 1000 UNITS CAPS Take 1 capsule by mouth 2 (two) times daily.    . fish oil-omega-3 fatty acids 1000 MG capsule Take 1 g by mouth 2 (two) times daily.    . hydrochlorothiazide (HYDRODIURIL) 25 MG tablet Take 12.5 mg by mouth daily as needed (PRN swelling.).    Marland Kitchen levothyroxine (SYNTHROID, LEVOTHROID) 50 MCG tablet Take 1 tablet by mouth daily.  2  . losartan (COZAAR) 50 MG tablet TAKE 1 TABLET BY MOUTH TWICE A DAY 60 tablet 0  . magnesium hydroxide (MILK OF MAGNESIA) 800 MG/5ML suspension Take 5 mLs by mouth daily as needed for constipation.    . meclizine (ANTIVERT) 25 MG tablet Take 25 mg by mouth as needed.  2  . Multiple Vitamins-Minerals (PRESERVISION AREDS) CAPS Take 1 capsule by mouth 2 (two) times daily. VITAMIN A FREE    . OVER THE COUNTER MEDICATION Apply 1 application topically daily.    Tanner Ho Glycol-Propyl Glycol (SYSTANE) 0.4-0.3 % SOLN Apply 2 drops to eye daily as needed (for dry eeys).    . polyethylene glycol (MIRALAX / GLYCOLAX) packet Take 17 g by mouth daily.    . Probiotic Product (PROBIOTIC DAILY PO) Take 1 tablet by mouth daily.    . rosuvastatin (CRESTOR) 5 MG tablet Take 1 tablet (5 mg total) by mouth daily at 6 PM. 30 tablet 8  . warfarin (COUMADIN) 5 MG tablet Take 2.5-5 mg by mouth daily. TAKE 7.5 ON Monday AND THURSDAY Takes 75m all other days     No current facility-administered medications for this visit.    Social History   Social History  . Marital Status: Married    Spouse Name: N/A  . Number of Children: N/A  . Years of Education: N/A   Occupational History  . Not on file.   Social History Main Topics  . Smoking status: Never Smoker   .  Smokeless tobacco: Never Used  . Alcohol Use: No  . Drug Use: No  . Sexual Activity: Not on file   Other Topics Concern  . Not on file   Social History Narrative    Social history is notable in that he is married. 2 children 4 grandchildren. No tobacco alcohol use.  ROS General: Negative; No fevers, chills, or night sweats;  HEENT: Negative; No changes in vision or hearing, sinus congestion, difficulty swallowing Pulmonary: Negative; No cough, wheezing, shortness of breath, hemoptysis Cardiovascular: Negative; No chest pain, presyncope, syncope, palpitations Edema has improved GI: Positive for GERD; No nausea, vomiting, diarrhea, or abdominal pain GU: Negative; No dysuria, hematuria, or difficulty voiding Musculoskeletal: Negative; no myalgias, joint pain, or weakness Hematologic/Oncology: Negative; no easy bruising, bleeding Endocrine: Negative; no heat/cold intolerance; no diabetes Neuro: Negative; no changes in balance, headaches Skin: Negative; No rashes or skin lesions Psychiatric: Negative; No behavioral problems, depression Sleep: Negative; No snoring, daytime sleepiness,  hypersomnolence, bruxism, restless legs, hypnogognic hallucinations, no cataplexy Other comprehensive 14 point system review is negative.   PE BP 120/56 mmHg  Pulse 61  Ht _0  (1.727 m)  Wt 163 lb (73.936 kg)  BMI 24.79 kg/m2   Wt Readings from Last 3 Encounters:  05/24/15 163 lb (73.936 kg)  12/01/14 160 lb (72.576 kg)  05/06/14 158 lb 11.2 oz (71.986 kg)   General: Alert, oriented, no distress.  Skin: normal turgor, no rashes HEENT: Normocephalic, atraumatic. Pupils round and reactive; sclera anicteric;no lid lag.  Nose without nasal septal hypertrophy Mouth/Parynx benign; Mallinpatti scale 3 Neck: No JVD, no carotid bruits with normal carotid upstroke No chest wall tenderness to palpation Lungs: clear to ausculatation and percussion; no wheezing or rales Heart: RRR, s1 s2 normal, with a 1/6 systolic murmur.  No diastolic murmur.  No rubs, thrills or heaves Abdomen: soft, nontender; no hepatosplenomehaly, BS+; abdominal aorta nontender and not dilated by palpation. Back: No CVA tenderness Pulses 2+ Extremities: Trivial ankle edema, left greater than right;, no clubbing cyanosis, Homan's sign negative  Neurologic: grossly nonfocal Psychological: Normal cognitive function, normal affect and mood  ECG (independently read by me): Normal sinus rhythm at 61 bpm.  No ectopy.  QTc interval 434 ms.  June 2016 ECG (independently read by me): Sinus bradycardia 52 bpm.  QTc interval 407 ms.  PR interval 158 msec  December 2015 ECG (independently read by me): Sinus bradycardia at 49 bpm.  Nonspecific ST changes.  QTc interval 424 ms.  ECG (independently read by me): Sinus bradycardia 50 beats per minute.  Nonspecific ST changes.    Prior ECG: Sinus bradycardia at 50 beats per minute. No ectopy on ECG. Intervals normal  LABS: BMP Latest Ref Rng 12/28/2014 01/05/2014 04/14/2013  Glucose 65 - 99 mg/dL 94 98 128(H)  BUN 7 - 25 mg/dL _1 Creatinine 0.70 - 1.18 mg/dL 1.40(H) 1.46(H)  1.26  Sodium 135 - 146 mEq/L 139 141 142  Potassium 3.5 - 5.3 mEq/L 4.6 4.6 4.5  Chloride 98 - 110 mEq/L 103 105 105  CO2 20 - 31 mEq/L _2 Calcium 8.6 - 10.3 mg/dL 9.0 9.4 9.1   Hepatic Function Latest Ref Rng 12/28/2014 01/05/2014  Total Protein 6.1 - 8.1 g/dL 6.2 6.7  Albumin 3.6 - 5.1 g/dL 3.7 4.4  AST 10 - 35 U/L 17 15  ALT 9 - 46 U/L 15 15  Alk Phosphatase 40 - 115 U/L 117(H) 107  Total Bilirubin 0.2 - 1.2 mg/dL  0.6 0.4   CBC Latest Ref Rng 12/28/2014 01/05/2014 04/14/2013  WBC 4.0 - 10.5 K/uL 4.7 5.2 4.2  Hemoglobin 13.0 - 17.0 g/dL 13.3 13.9 13.7  Hematocrit 39.0 - 52.0 % 39.5 39.7 40.1  Platelets 150 - 400 K/uL 173 155 135(L)   Lab Results  Component Value Date   MCV 100.0 12/28/2014   MCV 98.3 01/05/2014   MCV 102.0* 04/14/2013   Lab Results  Component Value Date   TSH 2.250 12/28/2014   Lipid Panel     Component Value Date/Time   CHOL 120* 12/28/2014 0924   TRIG 85 12/28/2014 0924   HDL 49 12/28/2014 0924   CHOLHDL 2.4 12/28/2014 0924   VLDL 17 12/28/2014 0924   LDLCALC 54 12/28/2014 0924     RADIOLOGY: No results found.    ASSESSMENT AND PLAN: Ms. Mohon is a 74 year old gentleman who has a history of hypertension, hyperlipidemia, hypothyroidism, mitral valve prolapse, as well as paroxysmal atrial fibrillation.  He continues to maintain sinus rhythm without recurrent atrial fibrillation.  His blood pressure is stable.  When I last saw him, he was bradycardic and I weaned and discontinued him off Toprol.  His resting pulse is now 61.  He continues to be on amiodarone at 100 mg daily and is tolerating this well.  He is on Crestor 5 mg for hyperlipidemia.  We had a long discussion today comparing Coumadin versus Xarelto or Eliquis as possible alternatives for his anticoagulation therapy.  Based on his age, if we were to institute NOAC therapy  My recommendation would be eliquis 5 mg twice a day.  However, I would recommend follow-up laboratory be obtained to  make certain his renal function is stable.  He will also need to make sure his INR drops below 2 before the eliquis is instituted.  I discussed the advantages and disadvantages of each treatment options.  He will be going back to Dr. Pennie Banter office in 2 weeks and will discuss this further with her pharmacist and  anticoagulation clinic.  I will see him in 6 months for cardiology reevaluation.  Time spent: 25 minutes Troy Sine, MD, [facility]  05/24/2015 7:03 PM

## 2015-05-24 NOTE — Patient Instructions (Signed)
Your physician wants you to follow-up in: 6 months or sooner if needed. You will receive a reminder letter in the mail two months in advance. If you don't receive a letter, please call our office to schedule the follow-up appointment.   If you need a refill on your cardiac medications before your next appointment, please call your pharmacy. 

## 2015-06-01 DIAGNOSIS — I4891 Unspecified atrial fibrillation: Secondary | ICD-10-CM | POA: Diagnosis not present

## 2015-06-01 DIAGNOSIS — Z7901 Long term (current) use of anticoagulants: Secondary | ICD-10-CM | POA: Diagnosis not present

## 2015-06-02 ENCOUNTER — Telehealth: Payer: Self-pay | Admitting: Pharmacist Clinician (PhC)/ Clinical Pharmacy Specialist

## 2015-06-02 MED ORDER — APIXABAN 5 MG PO TABS: 5.0000 mg | ORAL_TABLET | Freq: Two times a day (BID) | ORAL | Status: DC

## 2015-06-02 NOTE — Telephone Encounter (Signed)
Patient to switch from warfarin to Eliquis, had INR checked yesterday, was 2.7.  Took warfarin dose last night.  Advised he take no more warfarin and then start Eliquis on Sunday morning with 5 mg twice daily thereafter.  Will leave sample and 30 day free voucher at front desk for patient to pick up today.  Send prescription to [facility] at [facility]

## 2015-06-09 ENCOUNTER — Other Ambulatory Visit: Payer: Self-pay | Admitting: Cardiology

## 2015-06-26 DIAGNOSIS — L820 Inflamed seborrheic keratosis: Secondary | ICD-10-CM | POA: Diagnosis not present

## 2015-06-26 DIAGNOSIS — D485 Neoplasm of uncertain behavior of skin: Secondary | ICD-10-CM | POA: Diagnosis not present

## 2015-06-26 DIAGNOSIS — D225 Melanocytic nevi of trunk: Secondary | ICD-10-CM | POA: Diagnosis not present

## 2015-06-26 DIAGNOSIS — Z85828 Personal history of other malignant neoplasm of skin: Secondary | ICD-10-CM | POA: Diagnosis not present

## 2015-06-26 DIAGNOSIS — L570 Actinic keratosis: Secondary | ICD-10-CM | POA: Diagnosis not present

## 2015-06-26 DIAGNOSIS — C44311 Basal cell carcinoma of skin of nose: Secondary | ICD-10-CM | POA: Diagnosis not present

## 2015-06-26 DIAGNOSIS — Z23 Encounter for immunization: Secondary | ICD-10-CM | POA: Diagnosis not present

## 2015-06-26 DIAGNOSIS — L821 Other seborrheic keratosis: Secondary | ICD-10-CM | POA: Diagnosis not present

## 2015-07-10 ENCOUNTER — Telehealth: Payer: Self-pay | Admitting: Cardiovascular Disease

## 2015-07-10 MED ORDER — APIXABAN 5 MG PO TABS: 5.0000 mg | ORAL_TABLET | Freq: Two times a day (BID) | ORAL | Status: DC

## 2015-07-10 NOTE — Telephone Encounter (Signed)
New message  Pt c/o medication issue:  1. Name of Medication: Eliquis  4. What is your medication issue? Per pt he states that they are charging him more than they said they would. Pt states that he would need a request to get the cost down. They have faxed the form to Dr. Claiborne Billings. Tier exchange was sent over. Please call back to discuss.   Pt would also like to discuss a pick up of samples.

## 2015-07-10 NOTE — Telephone Encounter (Signed)
Spoke with pt, he is needing a tier exception for his eliquis. Samples given, will forward to wanda, dr Evette Georges ma.

## 2015-07-25 ENCOUNTER — Telehealth: Payer: Self-pay | Admitting: Cardiovascular Disease

## 2015-07-25 NOTE — Telephone Encounter (Signed)
New message     Calling to see if an appeal has been filed regarding his eliquis tier change with [facility]?

## 2015-07-25 NOTE — Telephone Encounter (Signed)
Mariann Laster, has this been done?

## 2015-07-31 NOTE — Telephone Encounter (Signed)
[facility] no longer covers Eliquis.  Can put in for a tier exception or just switch the patient to Xarelto.  His dose would be 15 mg once daily with a meal.  Talk to patient and see if willing to switch.  Meds have same mechanism of action, so it would be like going from a green apple to a red one.

## 2015-07-31 NOTE — Telephone Encounter (Signed)
I know nothing about this

## 2015-08-10 ENCOUNTER — Telehealth: Payer: Self-pay | Admitting: Cardiovascular Disease

## 2015-08-10 NOTE — Telephone Encounter (Signed)
New message ° ° ° ° °Returning a call to the nurse °

## 2015-08-11 MED ORDER — APIXABAN 5 MG PO TABS: 5.0000 mg | ORAL_TABLET | Freq: Two times a day (BID) | ORAL | Status: DC

## 2015-08-11 NOTE — Telephone Encounter (Signed)
Spoke to patient  2 weeks of sample available. Will pick -up  Patient also wanted to know if tier exception was in process- inform patient will send to [facility] Not sure if this is in process.

## 2015-08-11 NOTE — Telephone Encounter (Signed)
Follow up ° ° ° ° ° °Patient calling the office for samples of medication: ° ° °1.  What medication and dosage are you requesting samples for?  eliquis 5mg ° °2.  Are you currently out of this medication?  Almost out ° ° °

## 2015-08-15 ENCOUNTER — Other Ambulatory Visit: Payer: Self-pay | Admitting: *Deleted

## 2015-08-15 MED ORDER — RIVAROXABAN 15 MG PO TABS: 15.0000 mg | ORAL_TABLET | Freq: Every day | ORAL | Status: DC

## 2015-08-15 NOTE — Telephone Encounter (Signed)
Spoke with patient he is in agreement on changing the medication to xrelto 15 mg.

## 2015-09-06 DIAGNOSIS — C44311 Basal cell carcinoma of skin of nose: Secondary | ICD-10-CM | POA: Diagnosis not present

## 2015-09-12 ENCOUNTER — Telehealth: Payer: Self-pay | Admitting: Cardiovascular Disease

## 2015-09-12 NOTE — Telephone Encounter (Signed)
New message      Calling to report a drug interaction between amiodarone and warfarin.  They are not sure if he is still on warfarin--last refill was in November.  He is having his amiodarone refilled. Also, is pt still on warfarin?

## 2015-09-12 NOTE — Telephone Encounter (Signed)
Assured pharmacy we are aware.  Patient gets INR checked by other provider, but has been on this amiodarone dose for >1 year.

## 2015-09-13 DIAGNOSIS — E039 Hypothyroidism, unspecified: Secondary | ICD-10-CM | POA: Diagnosis not present

## 2015-09-13 DIAGNOSIS — E7800 Pure hypercholesterolemia, unspecified: Secondary | ICD-10-CM | POA: Diagnosis not present

## 2015-09-13 DIAGNOSIS — Z0000 Encounter for general adult medical examination without abnormal findings: Secondary | ICD-10-CM | POA: Diagnosis not present

## 2015-09-13 DIAGNOSIS — I10 Essential (primary) hypertension: Secondary | ICD-10-CM | POA: Diagnosis not present

## 2015-09-13 DIAGNOSIS — E559 Vitamin D deficiency, unspecified: Secondary | ICD-10-CM | POA: Diagnosis not present

## 2015-09-20 DIAGNOSIS — E7800 Pure hypercholesterolemia, unspecified: Secondary | ICD-10-CM | POA: Diagnosis not present

## 2015-09-20 DIAGNOSIS — I482 Chronic atrial fibrillation: Secondary | ICD-10-CM | POA: Diagnosis not present

## 2015-09-20 DIAGNOSIS — E039 Hypothyroidism, unspecified: Secondary | ICD-10-CM | POA: Diagnosis not present

## 2015-09-20 DIAGNOSIS — I10 Essential (primary) hypertension: Secondary | ICD-10-CM | POA: Diagnosis not present

## 2015-10-11 DIAGNOSIS — H43812 Vitreous degeneration, left eye: Secondary | ICD-10-CM | POA: Diagnosis not present

## 2015-10-11 DIAGNOSIS — H43811 Vitreous degeneration, right eye: Secondary | ICD-10-CM | POA: Diagnosis not present

## 2015-10-11 DIAGNOSIS — H43393 Other vitreous opacities, bilateral: Secondary | ICD-10-CM | POA: Diagnosis not present

## 2015-10-11 DIAGNOSIS — H353134 Nonexudative age-related macular degeneration, bilateral, advanced atrophic with subfoveal involvement: Secondary | ICD-10-CM | POA: Diagnosis not present

## 2015-10-19 DIAGNOSIS — J069 Acute upper respiratory infection, unspecified: Secondary | ICD-10-CM | POA: Diagnosis not present

## 2015-12-02 ENCOUNTER — Other Ambulatory Visit: Payer: Self-pay | Admitting: Cardiology

## 2015-12-20 ENCOUNTER — Telehealth: Payer: Self-pay | Admitting: Cardiovascular Disease

## 2015-12-20 NOTE — Telephone Encounter (Signed)
Received call from patient's wife.She stated they are at [facility].Stated husband felt weak ,no energy yesterday.Today he feels same heart beat irregular at 60 beats/min.No sob.No chest pain.Stated she wanted to know if his medications needed to be adjusted.Advised heart rate at 60 is normal.Advised to continue same medications.Advised to ER if needed.Requested sooner appointment with Dr.Kelly.Advised Dr.Kelly's schedule is full.Refused to see a extender.Will be put on wait list.

## 2015-12-20 NOTE — Telephone Encounter (Signed)
New message    Pt wife calling b/c pt is experiencing an irregular heart beat (60 beats per minute). Please advise.    Patient c/o Palpitations:  High priority if patient c/o lightheadedness and shortness of breath.  1. How long have you been having palpitations? Since last night  2. Are you currently experiencing lightheadedness and shortness of breath? no  3. Have you checked your BP and heart rate? (document readings) 60 beats per minutes and some pauses and extra beats  4. Are you experiencing any other symptoms? Not feeling well, no energy

## 2015-12-21 ENCOUNTER — Telehealth: Payer: Self-pay | Admitting: Cardiovascular Disease

## 2015-12-21 NOTE — Telephone Encounter (Signed)
New message  Patient c/o Palpitations:  High priority if patient c/o lightheadedness and shortness of breath.  1. How long have you been having palpitations? 2 days  2. Are you currently experiencing lightheadedness and shortness of breath? no 3. Have you checked your BP and heart rate? (document readings) no   4. Are you experiencing any other symptoms? Tired no energy

## 2015-12-21 NOTE — Telephone Encounter (Signed)
Appt made for pt to see Melina Copa, PA on July 26,2017 at 10:30. I spoke with pt and gave him appt information and made him aware this is at [facility] office. I explained to pt he is on appropriate medications and heart rate of 60 is OK.  I told him to go to ED if symptoms change prior to appt next week.

## 2015-12-21 NOTE — Telephone Encounter (Signed)
Spoke with pt. He is on his way back from [facility]. Wife is driving. He reports heart rate started feeling irregular Tuesday afternoon.  States sometimes it feels regular and other times irregular. Rate is currently around 60. Other than feeling a little tired he reports he is feeling OK.  No shortness of breath. He is taking Xarelto. Is asking if he can be seen in office tomorrow. I told pt Dr. Claiborne Billings is not in office this week and I would look for first available appt with PA or Dr. Claiborne Billings and call him back.

## 2015-12-26 ENCOUNTER — Encounter: Payer: Self-pay | Admitting: Physician Assistant

## 2015-12-26 NOTE — Progress Notes (Signed)
Cardiology Office Note    Date:  12/27/2015  ID:  Tanner Ho, DOB 04-13-1941, MRN [id number] PCP:  Horatio Pel, MD  Cardiologist:  Dr. Claiborne Billings  Chief Complaint: palpitations  History of Present Illness:  Tanner Ho is a 75 y.o. male with history of paroxysmal afib, PACs, sinus bradycardia, NSVT, CKD stage III, reported mitral valve prolapse (not mentioned on most recent echo 2014), HTN, hypothyroidism who presents for f/u of palpitations.   Per review of Dr. Evette Georges note, his last documented atrial fibrillation episode was in October 2013. A nuclear perfusion study in October 2013 revealed normal perfusion imaging but he developed 1-2 mm of ST segment depression at peak stress test and the possibility of microvascular etiology leading to his ECG abnormalities. A cardiopulmonary met test on 09/17/2012 demonstarated a reduced peak maximum oxygen consumption at 58%. He had a suboptimal peak cardiovascular stress load making cardiovascular status interpretation indeterminate. He did have mild ventilation/perfusion mismatch suggesting impaired ulnar circulation plus minus increased dead space. He had a blunted chronotropic response to exercise. The test was limited by leg fatigue. Dr. Claiborne Billings subsequently reduced his cardizem based on this result. Last 2D echo 2014: EF 55-60%, grade 2 DD, trace-mild MR. Last nuc 2014: low risk, likely diaphragmatic attenuation artifact, normal LV function. In 2015 he reported dizziness and fatigue. A cardiac monitor revealed episodes of PACs, bradycardia, with heart rates down to 48, 4 beat PSVT, 6 beat run of NSVT at a rate of 131. There was no recurrent afib. Last labs 09/2015: Hgb 13, BUN/Cr 26/1.6, K 4.8, TSH wnl.  He was at the beach last week and between 7/18-7/20 he noticed occasional irregularity of his heartbeat. It would come and go. His HR, however, remained around 60-70. He denied any other acute symptoms including CP, SOB, syncope, or dizziness.  He was not doing anything different - mostly just resting around the house. Denies excess alcohol. He reports a generalized weak feeling but this has been present for years. He has not had any further irregular heartbeats since Sunday 7/23.   Past Medical History:  Diagnosis Date  . Adrenal mass ([place])   . Asthma   . BPH (benign prostatic hyperplasia)   . CKD (chronic kidney disease), stage III   . GERD (gastroesophageal reflux disease)   . H/O cardiovascular stress test 03/2012   EKG with 1-2 mm ST segment depression but normal perfusion images. Followup metastases test reduced exercise effort and functional status and determinate for myocardial dysfunction  . History of cardiac monitoring    a. 2015: cardiac monitor revealed episodes of PACs, bradycardia, with heart rates down to 48, 4 beat PSVT, 6 beat run of NSVT at a rate of 131. There was no recurrent afib.   Marland Kitchen Hyperlipidemia LDL goal < 100   . Hypertension   . Hypothyroidism   . Macular degeneration   . MVP (mitral valve prolapse)    on cardiac cath - not mentioned on 2014 echo.  Marland Kitchen NSVT (nonsustained ventricular tachycardia) ([place])   . PAF (paroxysmal atrial fibrillation) ([place])    last episode 03/2012  . Pericarditis   . Premature atrial contractions   . Sinus bradycardia     Past Surgical History:  Procedure Laterality Date  . ABDOMINAL VASCULAR ULTRASOUND EVALUATION  12/20/2008   mild non-obstructive plaque, abdominal aorta, with mild calcification at the bifurcation-no obstruction, no evidence for aneurysm of the abdominal aorta  . CARDIAC CATHETERIZATION  01/04/1999   hyperdynamic LV function, probable  angiographic mitral valve prolapse, systolic muscle bridging of the mid LAD without obstructive disease, systolic narrowing up to 96%  . Cardiopulmonary MET-test  07/20/2012   reduced exercise effort and functional status, peak max O2 comsumption was only 58% of predicted, cardiovascular status was interpreted as indeterminate  for myocardial dysfunction due to the suboptimal peak cardiovascular stress load.    Current Medications: Current Outpatient Prescriptions  Medication Sig Dispense Refill  . acetaminophen (TYLENOL 8 HOUR ARTHRITIS PAIN) 650 MG CR tablet Take 650 mg by mouth every 8 (eight) hours as needed for pain.    Marland Kitchen alfuzosin (UROXATRAL) 10 MG 24 hr tablet Take 10 mg by mouth daily.    Marland Kitchen amiodarone (PACERONE) 100 MG tablet Take 100 mg by mouth daily.    Marland Kitchen amLODipine (NORVASC) 2.5 MG tablet Take 1 tablet (2.5 mg total) by mouth daily. 90 tablet 3  . Calcium Citrate (CITRACAL PO) Take 2 capsules by mouth.     . Cholecalciferol (VITAMIN D-3) 1000 UNITS CAPS Take 1 capsule by mouth 2 (two) times daily.    . fish oil-omega-3 fatty acids 1000 MG capsule Take 1 g by mouth 2 (two) times daily.    . hydrochlorothiazide (HYDRODIURIL) 25 MG tablet Take 12.5 mg by mouth daily as needed (PRN swelling.).    Marland Kitchen levothyroxine (SYNTHROID, LEVOTHROID) 50 MCG tablet Take 1 tablet by mouth daily.  2  . losartan (COZAAR) 50 MG tablet TAKE 1 TABLET BY MOUTH TWICE A DAY 60 tablet 2  . magnesium hydroxide (MILK OF MAGNESIA) 800 MG/5ML suspension Take 5 mLs by mouth daily as needed for constipation.    . meclizine (ANTIVERT) 25 MG tablet Take 25 mg by mouth as needed.  2  . Multiple Vitamins-Minerals (PRESERVISION AREDS) CAPS Take 1 capsule by mouth 2 (two) times daily. VITAMIN A FREE    . OVER THE COUNTER MEDICATION Apply 1 application topically daily.    Vladimir Faster Glycol-Propyl Glycol (SYSTANE) 0.4-0.3 % SOLN Apply 2 drops to eye daily as needed (for dry eeys).    . polyethylene glycol (MIRALAX / GLYCOLAX) packet Take 17 g by mouth daily.    . Probiotic Product (PROBIOTIC DAILY PO) Take 1 tablet by mouth daily.    . Rivaroxaban (XARELTO) 15 MG TABS tablet Take 1 tablet (15 mg total) by mouth daily with supper. 30 tablet 11  . rosuvastatin (CRESTOR) 5 MG tablet Take 1 tablet (5 mg total) by mouth daily at 6 PM. 30 tablet 8    No current facility-administered medications for this visit.      Allergies:   Ciprofloxacin and Hytrin [terazosin]   Social History   Social History  . Marital status: Married    Spouse name: N/A  . Number of children: N/A  . Years of education: N/A   Social History Main Topics  . Smoking status: Never Smoker  . Smokeless tobacco: Never Used  . Alcohol use No  . Drug use: No  . Sexual activity: Not Asked   Other Topics Concern  . None   Social History Narrative  . None     Family History:  The patient's family history includes Stroke in his mother and paternal grandfather.   ROS:   Please see the history of present illness. All other systems are reviewed and otherwise negative.    PHYSICAL EXAM:   VS:  BP 116/60   Pulse 72   Ht '5\' 9"'  (1.753 m)   Wt 160 lb 9.6 oz (72.8 kg)  SpO2 97%   BMI 23.72 kg/m   BMI: Body mass index is 23.72 kg/m. GEN: Well nourished, well developed WM, in no acute distress  HEENT: normocephalic, atraumatic Neck: no JVD, carotid bruits, or masses Cardiac: RRR; no murmurs, rubs, or gallops, no edema  Respiratory:  clear to auscultation bilaterally, normal work of breathing GI: soft, nontender, nondistended, + BS MS: no deformity or atrophy  Skin: warm and dry, no rash Neuro:  Alert and Oriented x 3, Strength and sensation are intact, follows commands Psych: euthymic mood, full affect  Wt Readings from Last 3 Encounters:  12/27/15 160 lb 9.6 oz (72.8 kg)  05/24/15 163 lb (73.9 kg)  12/01/14 160 lb (72.6 kg)      Studies/Labs Reviewed:   EKG:  EKG was ordered today and personally reviewed by me and demonstrates NSR 72bpm, no acute ST-T changes  Recent Labs: 12/28/2014: ALT 15; BUN 21; Creat 1.40; Hemoglobin 13.3; Platelets 173; Potassium 4.6; Sodium 139; TSH 2.250   Lipid Panel    Component Value Date/Time   CHOL 120 (L) 12/28/2014 0924   TRIG 85 12/28/2014 0924   HDL 49 12/28/2014 0924   CHOLHDL 2.4 12/28/2014 0924    VLDL 17 12/28/2014 0924   LDLCALC 54 12/28/2014 0924    Additional studies/ records that were reviewed today include: Summarized above.    ASSESSMENT & PLAN:   1. Palpitations - could've been recurrent AF. He also has hx of PSVT, PACs, PVCs, and NSVT so those are also a possiblity. The irregularity was not associated with any other symptoms. It has resolved. He is in NSR today. At this point I think it's reasonable to continue present regimen including amiodarone and Xarelto and observe for recurrence of symptoms. Will reserve repeat event monitoring for recurrent episode. We also discussed the [employer] smartphone EKG monitoring system as a possible alternative to capture future episodes. Will check labs today to exclude any metabolic abnormality contributing to the above. With regards to [facility], recent LFTs 09/2015 were normal. Obtaining TSH today. He reports compliance with yearly eye exams. Will defer decision regarding screening PFTs to Dr. Claiborne Billings when he sees the patient in follow-up. 2. PAF - see above. Continue Xarelto. Samples given today. CrCl 48 so he is on the appropriate dose. 3. Sinus bradycardia - stable. No sx of bradycardia. Amiodarone previously reduced due to this. We briefly discussed that occasionally patients require PPM if they show signs of tachy/bradysyndrome. There is presently no indication for such. 4. CKD stage III - f/u BMET today to ensure no acute change.  Disposition: F/u with Dr. Claiborne Billings as scheduled 02/2016.  Medication Adjustments/Labs and Tests Ordered: Current medicines are reviewed at length with the patient today.  Concerns regarding medicines are outlined above. Medication changes, Labs and Tests ordered today are summarized above and listed in the Patient Instructions accessible in Encounters.   Raechel Ache PA-C  12/27/2015 10:52 AM    [facility] [place], [place],   [address] Phone: [phone]; Fax: [phone]

## 2015-12-27 ENCOUNTER — Ambulatory Visit (INDEPENDENT_AMBULATORY_CARE_PROVIDER_SITE_OTHER): Payer: Medicare Other | Admitting: Physician Assistant

## 2015-12-27 ENCOUNTER — Encounter: Payer: Self-pay | Admitting: Physician Assistant

## 2015-12-27 VITALS — BP 116/60 | HR 72 | Ht 69.0 in | Wt 160.6 lb

## 2015-12-27 DIAGNOSIS — R002 Palpitations: Secondary | ICD-10-CM

## 2015-12-27 DIAGNOSIS — R001 Bradycardia, unspecified: Secondary | ICD-10-CM | POA: Diagnosis not present

## 2015-12-27 DIAGNOSIS — N183 Chronic kidney disease, stage 3 unspecified: Secondary | ICD-10-CM

## 2015-12-27 DIAGNOSIS — I480 Paroxysmal atrial fibrillation: Secondary | ICD-10-CM

## 2015-12-27 DIAGNOSIS — I491 Atrial premature depolarization: Secondary | ICD-10-CM

## 2015-12-27 LAB — CBC
HEMATOCRIT: 38.6 % (ref 38.5–50.0)
Hemoglobin: 13.2 g/dL (ref 13.2–17.1)
MCH: 34.4 pg — ABNORMAL HIGH (ref 27.0–33.0)
MCHC: 34.2 g/dL (ref 32.0–36.0)
MCV: 100.5 fL — AB (ref 80.0–100.0)
MPV: 8.8 fL (ref 7.5–12.5)
Platelets: 158 10*3/uL (ref 140–400)
RBC: 3.84 MIL/uL — ABNORMAL LOW (ref 4.20–5.80)
RDW: 13.2 % (ref 11.0–15.0)
WBC: 5 10*3/uL (ref 3.8–10.8)

## 2015-12-27 LAB — T4, FREE: Free T4: 1.5 ng/dL (ref 0.8–1.8)

## 2015-12-27 LAB — BASIC METABOLIC PANEL
BUN: 24 mg/dL (ref 7–25)
CHLORIDE: 104 mmol/L (ref 98–110)
CO2: 24 mmol/L (ref 20–31)
CREATININE: 1.59 mg/dL — AB (ref 0.70–1.18)
Calcium: 8.7 mg/dL (ref 8.6–10.3)
Glucose, Bld: 95 mg/dL (ref 65–99)
Potassium: 4.2 mmol/L (ref 3.5–5.3)
Sodium: 138 mmol/L (ref 135–146)

## 2015-12-27 LAB — TSH: TSH: 2.52 mIU/L (ref 0.40–4.50)

## 2015-12-27 LAB — MAGNESIUM: Magnesium: 2.4 mg/dL (ref 1.5–2.5)

## 2015-12-27 NOTE — Patient Instructions (Addendum)
Medication Instructions:   Your physician recommends that you continue on your current medications as directed. Please refer to the Current Medication list given to you today.    If you need a refill on your cardiac medications before your next appointment, please call your pharmacy.  Labwork:  BMET MAG CBC  TSH AND FREE T4    Testing/Procedures: NONE ORDER TODAY    Follow-Up: KEEP APPT WITH DR Claiborne Billings IN SEPTEMEBER   Any Other Special Instructions Will Be Listed Below (If Applicable).

## 2015-12-28 ENCOUNTER — Other Ambulatory Visit: Payer: Self-pay | Admitting: *Deleted

## 2015-12-28 DIAGNOSIS — N183 Chronic kidney disease, stage 3 unspecified: Secondary | ICD-10-CM

## 2015-12-28 DIAGNOSIS — I480 Paroxysmal atrial fibrillation: Secondary | ICD-10-CM

## 2016-01-08 ENCOUNTER — Other Ambulatory Visit (INDEPENDENT_AMBULATORY_CARE_PROVIDER_SITE_OTHER): Payer: Medicare Other

## 2016-01-08 DIAGNOSIS — N183 Chronic kidney disease, stage 3 unspecified: Secondary | ICD-10-CM

## 2016-01-08 DIAGNOSIS — I480 Paroxysmal atrial fibrillation: Secondary | ICD-10-CM

## 2016-01-08 LAB — BASIC METABOLIC PANEL
BUN: 24 mg/dL (ref 7–25)
CALCIUM: 8.8 mg/dL (ref 8.6–10.3)
CO2: 27 mmol/L (ref 20–31)
CREATININE: 1.49 mg/dL — AB (ref 0.70–1.18)
Chloride: 105 mmol/L (ref 98–110)
Glucose, Bld: 94 mg/dL (ref 65–99)
Potassium: 4.6 mmol/L (ref 3.5–5.3)
Sodium: 141 mmol/L (ref 135–146)

## 2016-01-10 ENCOUNTER — Telehealth: Payer: Self-pay | Admitting: Cardiovascular Disease

## 2016-01-10 NOTE — Telephone Encounter (Signed)
Return pt call with lab results. He verbalized understanding

## 2016-01-10 NOTE — Telephone Encounter (Signed)
New message   Pt is calling for Anderson Malta for his lab results

## 2016-01-24 ENCOUNTER — Other Ambulatory Visit: Payer: Self-pay | Admitting: Cardiovascular Disease

## 2016-01-24 NOTE — Telephone Encounter (Signed)
Rx(s) sent to pharmacy electronically.  

## 2016-02-06 ENCOUNTER — Other Ambulatory Visit: Payer: Self-pay

## 2016-02-13 ENCOUNTER — Encounter: Payer: Self-pay | Admitting: Cardiovascular Disease

## 2016-02-13 ENCOUNTER — Other Ambulatory Visit: Payer: Self-pay | Admitting: Cardiology

## 2016-02-13 ENCOUNTER — Ambulatory Visit (INDEPENDENT_AMBULATORY_CARE_PROVIDER_SITE_OTHER): Payer: Medicare Other | Admitting: Cardiovascular Disease

## 2016-02-13 VITALS — BP 130/60 | HR 68 | Ht 69.0 in | Wt 164.0 lb

## 2016-02-13 DIAGNOSIS — N183 Chronic kidney disease, stage 3 unspecified: Secondary | ICD-10-CM

## 2016-02-13 DIAGNOSIS — I480 Paroxysmal atrial fibrillation: Secondary | ICD-10-CM | POA: Diagnosis not present

## 2016-02-13 DIAGNOSIS — I2510 Atherosclerotic heart disease of native coronary artery without angina pectoris: Secondary | ICD-10-CM | POA: Diagnosis not present

## 2016-02-13 DIAGNOSIS — R002 Palpitations: Secondary | ICD-10-CM | POA: Diagnosis not present

## 2016-02-13 DIAGNOSIS — R001 Bradycardia, unspecified: Secondary | ICD-10-CM

## 2016-02-13 MED ORDER — METOPROLOL TARTRATE 50 MG PO TABS: ORAL_TABLET | ORAL | 3 refills | Status: DC

## 2016-02-13 NOTE — Patient Instructions (Signed)
Medication Instructions:   TAKE METOPROLOL 50 MG 1/2 TO 1 TABLETS AS NEEDED FOR PALPITATIONS  Follow-Up:  Your physician wants you to follow-up in: [place] will receive a reminder letter in the mail two months in advance. If you don't receive a letter, please call our office to schedule the follow-up appointment.   If you need a refill on your cardiac medications before your next appointment, please call your pharmacy.

## 2016-02-15 NOTE — Progress Notes (Signed)
Patient ID: LIBRADO GUANDIQUE, male   DOB: Jan 22, 1941, 75 y.o.   MRN: [id number]    Primary M.D.: Dr. [name]  HPI: MELVYN HOMMES is a 75 y.o. male who presents to the office today for a 10 month follow-up cardiology evaluation.   Mr. Hidalgo has a history of mitral valve prolapse, hypertension, hyperlipidemia, hypothyroidism, as well as paroxysmal atrial fibrillation. He is on chronic Coumadin anticoagulation. His last documented atrial fibrillation episode was in October 2013. A nuclear perfusion study in October 2013 revealed normal perfusion imaging but he developed 1-2 mm of ST segment depression at peak stress test and the possibility of microvascular etiology leading to his ECG abnormalities. A cardiopulmonary met test on 09/17/2012 demonstarated a reduced peak maximum oxygen consumption at 58%. He had a suboptimal peak cardiovascular stress load making cardiovascular status interpretation indeterminate. He did have mild ventilation/perfusion mismatch suggesting impaired ulnar circulation plus minus increased dead space. He had a blunted chronotropic response to exercise. The test was limited by leg fatigue. When I saw him in the office subsequently I reduced his Cardizem from 240 to180 mg. At times, he notes a rare palpitation. He denies any awareness of breakthrough atrial fibrillation.   He presented to the emergency room in November 2014 with some vague chest pain. In retrospect, he feels this may have been GERD symptoms. Cardiac enzymes were negative. ECG was without changes. A nuclear perfusion study on 04/22/2013 was low risk and showed mild diaphragmatic attenuation but did not show a region of scar or ischemia, unchanged from his prior study of several years previously.  In 2015 he had noticed some dizziness and decreased energy   A cardiac monitor revealed episodes of PACs, bradycardia, with heart rates down to 48, but he also had a 6 beat run of wide-complex tachycardia at a rate of  131.  Remotely, he had developed breakthrough atrial fibrillation on amiodarone 100 mg and he has been on 100 mg, alternating with 200 mg daily.  On this most recent monitor, there were no episodes of recurrent AF.   He has been on amiodarone 100 mg daily, Norvasc 2.5 mg, losartan 50 mg bid, HCTZ 12.5 mg daily, Toprol-XL 12.5 mg daily, Crestor 5 mg and Coumadin.  He also has noticed rare episodes of vertigo for which he takes meclizine.    He also has been on Uroxatral to improve urinary flow. When I last saw him we had a lengthy discussion concerning warfarin versus new or anticoagulants.  He opted to switch to Xarelto and has been maintained on 15 mg daily based on his creatinine clearance.  He has tolerated this well without bleeding.  Since I saw him, he was evaluated by Hinton Dyer done in the office on 12/27/2015.  [place] had [place] the beach.  Prior to that visit and had noticed an occasional irregularity of his heartbeat.  When she evaluated him, he was still in sinus rhythm without ectopy and there were no acute changes.  Presently continues to feel that his pulse has been stable.  He denies chest pain.  He denies presyncope or syncope.  He presents for reevaluation.  Past Medical History:  Diagnosis Date  . Adrenal mass ([place])   . Asthma   . BPH (benign prostatic hyperplasia)   . CKD (chronic kidney disease), stage III   . GERD (gastroesophageal reflux disease)   . H/O cardiovascular stress test 03/2012   EKG with 1-2 mm ST segment depression but normal perfusion images. Followup  metastases test reduced exercise effort and functional status and determinate for myocardial dysfunction  . History of cardiac monitoring    a. 2015: cardiac monitor revealed episodes of PACs, bradycardia, with heart rates down to 48, 4 beat PSVT, 6 beat run of NSVT at a rate of 131. There was no recurrent afib.   Marland Kitchen Hyperlipidemia LDL goal < 100   . Hypertension   . Hypothyroidism   . Macular degeneration   . MVP (mitral  valve prolapse)    on cardiac cath - not mentioned on 2014 echo.  Marland Kitchen NSVT (nonsustained ventricular tachycardia) ([place])   . PAF (paroxysmal atrial fibrillation) ([place])    last episode 03/2012  . Pericarditis   . Premature atrial contractions   . Sinus bradycardia     Past Surgical History:  Procedure Laterality Date  . ABDOMINAL VASCULAR ULTRASOUND EVALUATION  12/20/2008   mild non-obstructive plaque, abdominal aorta, with mild calcification at the bifurcation-no obstruction, no evidence for aneurysm of the abdominal aorta  . CARDIAC CATHETERIZATION  01/04/1999   hyperdynamic LV function, probable angiographic mitral valve prolapse, systolic muscle bridging of the mid LAD without obstructive disease, systolic narrowing up to 03%  . Cardiopulmonary MET-test  07/20/2012   reduced exercise effort and functional status, peak max O2 comsumption was only 58% of predicted, cardiovascular status was interpreted as indeterminate for myocardial dysfunction due to the suboptimal peak cardiovascular stress load.    Allergies  Allergen Reactions  . Ciprofloxacin Other (See Comments)    Cannot take due to currently taking Amiodarone  . Hytrin [Terazosin] Other (See Comments)    Syncopal event     Current Outpatient Prescriptions  Medication Sig Dispense Refill  . acetaminophen (TYLENOL 8 HOUR ARTHRITIS PAIN) 650 MG CR tablet Take 650 mg by mouth every 8 (eight) hours as needed for pain.    Marland Kitchen alfuzosin (UROXATRAL) 10 MG 24 hr tablet Take 10 mg by mouth daily.    Marland Kitchen amiodarone (PACERONE) 100 MG tablet Take 100 mg by mouth daily.    Marland Kitchen amLODipine (NORVASC) 2.5 MG tablet Take 1 tablet (2.5 mg total) by mouth daily. 90 tablet 3  . Cholecalciferol (VITAMIN D-3) 1000 UNITS CAPS Take 1 capsule by mouth daily.     . fish oil-omega-3 fatty acids 1000 MG capsule Take 1 g by mouth 2 (two) times daily.    . hydrochlorothiazide (HYDRODIURIL) 25 MG tablet Take 12.5 mg by mouth every other day.     . levothyroxine  (SYNTHROID, LEVOTHROID) 50 MCG tablet Take 1 tablet by mouth daily.  2  . magnesium hydroxide (MILK OF MAGNESIA) 800 MG/5ML suspension Take 5 mLs by mouth daily as needed for constipation.    . meclizine (ANTIVERT) 25 MG tablet Take 25 mg by mouth as needed.  2  . Multiple Vitamins-Minerals (PRESERVISION AREDS) CAPS Take 1 capsule by mouth 2 (two) times daily. VITAMIN A FREE    . OVER THE COUNTER MEDICATION Apply 1 application topically daily.    Vladimir Faster Glycol-Propyl Glycol (SYSTANE) 0.4-0.3 % SOLN Apply 2 drops to eye daily as needed (for dry eeys).    . polyethylene glycol (MIRALAX / GLYCOLAX) packet Take 17 g by mouth daily.    . Probiotic Product (PROBIOTIC DAILY PO) Take 1 tablet by mouth daily.    . Rivaroxaban (XARELTO) 15 MG TABS tablet Take 1 tablet (15 mg total) by mouth daily with supper. 30 tablet 11  . rosuvastatin (CRESTOR) 5 MG tablet Take 1 tablet (5 mg total)  by mouth daily at 6 PM. 30 tablet 8  . losartan (COZAAR) 50 MG tablet TAKE 1 TABLET BY MOUTH TWICE A DAY 60 tablet 11  . metoprolol tartrate (LOPRESSOR) 50 MG tablet TALE 1/2 TO 1 TABLETS AS NEEDED FOR PALPITATIONS 90 tablet 3   No current facility-administered medications for this visit.     Social History   Social History  . Marital status: Married    Spouse name: N/A  . Number of children: N/A  . Years of education: N/A   Occupational History  . Not on file.   Social History Main Topics  . Smoking status: Never Smoker  . Smokeless tobacco: Never Used  . Alcohol use No  . Drug use: No  . Sexual activity: Not on file   Other Topics Concern  . Not on file   Social History Narrative  . No narrative on file    Social history is notable in that he is married. 2 children 4 grandchildren. No tobacco alcohol use.  ROS General: Negative; No fevers, chills, or night sweats;  HEENT: Negative; No changes in vision or hearing, sinus congestion, difficulty swallowing Pulmonary: Negative; No cough,  wheezing, shortness of breath, hemoptysis Cardiovascular: Negative; No chest pain, presyncope, syncope, palpitations Edema has improved GI: Positive for GERD; No nausea, vomiting, diarrhea, or abdominal pain GU: Negative; No dysuria, hematuria, or difficulty voiding Musculoskeletal: Negative; no myalgias, joint pain, or weakness Hematologic/Oncology: Negative; no easy bruising, bleeding Endocrine: Negative; no heat/cold intolerance; no diabetes Neuro: Negative; no changes in balance, headaches Skin: Negative; No rashes or skin lesions Psychiatric: Negative; No behavioral problems, depression Sleep: Negative; No snoring, daytime sleepiness, hypersomnolence, bruxism, restless legs, hypnogognic hallucinations, no cataplexy Other comprehensive 14 point system review is negative.   PE BP 130/60   Pulse 68   Ht _0  (1.753 m)   Wt 164 lb (74.4 kg)   BMI 24.22 kg/m    Repeat blood pressure by me was 120/64.  Wt Readings from Last 3 Encounters:  02/13/16 164 lb (74.4 kg)  12/27/15 160 lb 9.6 oz (72.8 kg)  05/24/15 163 lb (73.9 kg)   General: Alert, oriented, no distress.  Skin: normal turgor, no rashes HEENT: Normocephalic, atraumatic. Pupils round and reactive; sclera anicteric;no lid lag.  Nose without nasal septal hypertrophy Mouth/Parynx benign; Mallinpatti scale 3 Neck: No JVD, no carotid bruits with normal carotid upstroke No chest wall tenderness to palpation Lungs: clear to ausculatation and percussion; no wheezing or rales Heart: RRR, s1 s2 normal, with a 1/6 systolic murmur.  No diastolic murmur.  No rubs, thrills or heaves Abdomen: soft, nontender; no hepatosplenomehaly, BS+; abdominal aorta nontender and not dilated by palpation. Back: No CVA tenderness Pulses 2+ Extremities: Trivial ankle edema, left greater than right;, no clubbing cyanosis, Homan's sign negative  Neurologic: grossly nonfocal Psychological: Normal cognitive function, normal affect and mood  ECG  (independently read by me): Sinus rhythm at 68.  Normal intervals.  No significant ST-T changes.  December 2016 ECG (independently read by me): Normal sinus rhythm at 61 bpm.  No ectopy.  QTc interval 434 ms.  June 2016 ECG (independently read by me): Sinus bradycardia 52 bpm.  QTc interval 407 ms.  PR interval 158 msec  December 2015 ECG (independently read by me): Sinus bradycardia at 49 bpm.  Nonspecific ST changes.  QTc interval 424 ms.  ECG (independently read by me): Sinus bradycardia 50 beats per minute.  Nonspecific ST changes.    Prior ECG: Sinus bradycardia  at 50 beats per minute. No ectopy on ECG. Intervals normal  LABS: BMP Latest Ref Rng & Units 01/08/2016 12/27/2015 12/28/2014  Glucose 65 - 99 mg/dL 94 95 94  BUN 7 - 25 mg/dL _0 Creatinine 0.70 - 1.18 mg/dL 1.49(H) 1.59(H) 1.40(H)  Sodium 135 - 146 mmol/L 141 138 139  Potassium 3.5 - 5.3 mmol/L 4.6 4.2 4.6  Chloride 98 - 110 mmol/L 105 104 103  CO2 20 - 31 mmol/L _1 Calcium 8.6 - 10.3 mg/dL 8.8 8.7 9.0   Hepatic Function Latest Ref Rng & Units 12/28/2014 01/05/2014  Total Protein 6.1 - 8.1 g/dL 6.2 6.7  Albumin 3.6 - 5.1 g/dL 3.7 4.4  AST 10 - 35 U/L 17 15  ALT 9 - 46 U/L 15 15  Alk Phosphatase 40 - 115 U/L 117(H) 107  Total Bilirubin 0.2 - 1.2 mg/dL 0.6 0.4   CBC Latest Ref Rng & Units 12/27/2015 12/28/2014 01/05/2014  WBC 3.8 - 10.8 K/uL 5.0 4.7 5.2  Hemoglobin 13.2 - 17.1 g/dL 13.2 13.3 13.9  Hematocrit 38.5 - 50.0 % 38.6 39.5 39.7  Platelets 140 - 400 K/uL 158 173 155   Lab Results  Component Value Date   MCV 100.5 (H) 12/27/2015   MCV 100.0 12/28/2014   MCV 98.3 01/05/2014   Lab Results  Component Value Date   TSH 2.52 12/27/2015   Lipid Panel     Component Value Date/Time   CHOL 120 (L) 12/28/2014 0924   TRIG 85 12/28/2014 0924   HDL 49 12/28/2014 0924   CHOLHDL 2.4 12/28/2014 0924   VLDL 17 12/28/2014 0924   LDLCALC 54 12/28/2014 0924     RADIOLOGY: No results  found.    ASSESSMENT AND PLAN: Ms. Crass is a 75 year old gentleman who has a history of hypertension, hyperlipidemia, hypothyroidism, mitral valve prolapse, as well as paroxysmal atrial fibrillation.  He continues to maintain sinus rhythm without recurrent atrial fibrillation.  His blood pressure is stable on losartan 50 mg twice a day in addition to HCTZ which he takes 12.5 mg every other day.  He does not have any edema..  Previously, I had discontinued his daily beta blocker regimen when he was bradycardic.  His resting pulse is now in the 60s.  He continues to be on amiodarone at 100 mg daily and is tolerating this well.  He is on Crestor 5 mg for hyperlipidemia.  He is now on Xarelto at 15 mg for his.  This to his previous warfarin therapy.  He had experienced some episodes of palpitations.  This summer leading to his reevaluation in the office with Hinton Dyer done.  At that time he was in normal sinus rhythm.  I have suggested that he have a prescription for metoprolol and take 25-50 mg on a PRN basis if needed if recurrent palpitations developed.  Reviewed recent blood work.  He has mild renal insufficiency with a creatinine of 1.49, but this is actually improved from 1.59 previously.  Laboratory will be done by Dr. Shelia Media.  Target LDL is less than 70.  As long as he remains stable I will see him in 6 months for reevaluation.  Time spent: 25 minutes Troy Sine, MD, [facility]  02/15/2016 4:22 PM

## 2016-03-23 ENCOUNTER — Other Ambulatory Visit: Payer: Self-pay | Admitting: Cardiovascular Disease

## 2016-03-28 DIAGNOSIS — Z7901 Long term (current) use of anticoagulants: Secondary | ICD-10-CM | POA: Diagnosis not present

## 2016-03-28 DIAGNOSIS — E7800 Pure hypercholesterolemia, unspecified: Secondary | ICD-10-CM | POA: Diagnosis not present

## 2016-03-28 DIAGNOSIS — I482 Chronic atrial fibrillation: Secondary | ICD-10-CM | POA: Diagnosis not present

## 2016-03-28 DIAGNOSIS — I10 Essential (primary) hypertension: Secondary | ICD-10-CM | POA: Diagnosis not present

## 2016-03-28 DIAGNOSIS — Z23 Encounter for immunization: Secondary | ICD-10-CM | POA: Diagnosis not present

## 2016-04-10 ENCOUNTER — Other Ambulatory Visit: Payer: Self-pay | Admitting: Cardiovascular Disease

## 2016-04-10 NOTE — Telephone Encounter (Signed)
Rx has been sent to the pharmacy electronically. ° °

## 2016-05-26 ENCOUNTER — Other Ambulatory Visit: Payer: Self-pay | Admitting: Cardiovascular Disease

## 2016-05-28 NOTE — Telephone Encounter (Signed)
REFILL 

## 2016-06-10 DIAGNOSIS — N401 Enlarged prostate with lower urinary tract symptoms: Secondary | ICD-10-CM | POA: Diagnosis not present

## 2016-06-10 DIAGNOSIS — R338 Other retention of urine: Secondary | ICD-10-CM | POA: Diagnosis not present

## 2016-06-10 DIAGNOSIS — N281 Cyst of kidney, acquired: Secondary | ICD-10-CM | POA: Diagnosis not present

## 2016-06-28 DIAGNOSIS — H01005 Unspecified blepharitis left lower eyelid: Secondary | ICD-10-CM | POA: Diagnosis not present

## 2016-07-02 DIAGNOSIS — L570 Actinic keratosis: Secondary | ICD-10-CM | POA: Diagnosis not present

## 2016-07-02 DIAGNOSIS — Z85828 Personal history of other malignant neoplasm of skin: Secondary | ICD-10-CM | POA: Diagnosis not present

## 2016-07-02 DIAGNOSIS — L821 Other seborrheic keratosis: Secondary | ICD-10-CM | POA: Diagnosis not present

## 2016-07-02 DIAGNOSIS — Z86018 Personal history of other benign neoplasm: Secondary | ICD-10-CM | POA: Diagnosis not present

## 2016-07-02 DIAGNOSIS — Z23 Encounter for immunization: Secondary | ICD-10-CM | POA: Diagnosis not present

## 2016-07-02 DIAGNOSIS — L853 Xerosis cutis: Secondary | ICD-10-CM | POA: Diagnosis not present

## 2016-08-02 DIAGNOSIS — H0015 Chalazion left lower eyelid: Secondary | ICD-10-CM | POA: Diagnosis not present

## 2016-08-02 DIAGNOSIS — H353134 Nonexudative age-related macular degeneration, bilateral, advanced atrophic with subfoveal involvement: Secondary | ICD-10-CM | POA: Diagnosis not present

## 2016-08-02 DIAGNOSIS — H524 Presbyopia: Secondary | ICD-10-CM | POA: Diagnosis not present

## 2016-08-02 DIAGNOSIS — H548 Legal blindness, as defined in USA: Secondary | ICD-10-CM | POA: Diagnosis not present

## 2016-08-13 ENCOUNTER — Ambulatory Visit (INDEPENDENT_AMBULATORY_CARE_PROVIDER_SITE_OTHER): Payer: Medicare Other | Admitting: Cardiovascular Disease

## 2016-08-13 ENCOUNTER — Ambulatory Visit: Payer: Medicare Other | Admitting: Cardiovascular Disease

## 2016-08-13 ENCOUNTER — Encounter: Payer: Self-pay | Admitting: Cardiovascular Disease

## 2016-08-13 VITALS — BP 122/60 | HR 58 | Ht 68.0 in | Wt 159.8 lb

## 2016-08-13 DIAGNOSIS — R001 Bradycardia, unspecified: Secondary | ICD-10-CM

## 2016-08-13 DIAGNOSIS — N183 Chronic kidney disease, stage 3 unspecified: Secondary | ICD-10-CM

## 2016-08-13 DIAGNOSIS — E039 Hypothyroidism, unspecified: Secondary | ICD-10-CM

## 2016-08-13 DIAGNOSIS — Z7901 Long term (current) use of anticoagulants: Secondary | ICD-10-CM | POA: Diagnosis not present

## 2016-08-13 DIAGNOSIS — R011 Cardiac murmur, unspecified: Secondary | ICD-10-CM

## 2016-08-13 DIAGNOSIS — I480 Paroxysmal atrial fibrillation: Secondary | ICD-10-CM

## 2016-08-13 DIAGNOSIS — I10 Essential (primary) hypertension: Secondary | ICD-10-CM

## 2016-08-13 NOTE — Progress Notes (Signed)
Patient ID: Tanner Ho, male   DOB: 1941/03/25, 76 y.o.   MRN: [id number]    Primary M.D.: Dr. [name]  HPI: Tanner Ho is a 76 y.o. male who presents to the office today for a 6 month follow-up cardiology evaluation.   Tanner Ho has a history of mitral valve prolapse, hypertension, hyperlipidemia, hypothyroidism, as well as paroxysmal atrial fibrillation. He is on chronic Coumadin anticoagulation. His last documented atrial fibrillation episode was in October 2013. A nuclear perfusion study in October 2013 revealed normal perfusion imaging but he developed 1-2 mm of ST segment depression at peak stress test and the possibility of microvascular etiology leading to his ECG abnormalities. A cardiopulmonary met test on 09/17/2012 demonstarated a reduced peak maximum oxygen consumption at 58%. He had a suboptimal peak cardiovascular stress load making cardiovascular status interpretation indeterminate. He did have mild ventilation/perfusion mismatch suggesting impaired ulnar circulation plus minus increased dead space. He had a blunted chronotropic response to exercise. The test was limited by leg fatigue. When I saw him in the office subsequently I reduced his Cardizem from 240 to180 mg. At times, he notes a rare palpitation. He denies any awareness of breakthrough atrial fibrillation.   He presented to the emergency room in November 2014 with some vague chest pain. In retrospect, he feels this may have been GERD symptoms. Cardiac enzymes were negative. ECG was without changes. A nuclear perfusion study on 04/22/2013 was low risk and showed mild diaphragmatic attenuation but did not show a region of scar or ischemia, unchanged from his prior study of several years previously.  In 2015 he had noticed some dizziness and decreased energy   A cardiac monitor revealed episodes of PACs, bradycardia, with heart rates down to 48, but he also had a 6 beat run of wide-complex tachycardia at a rate of 131.   Remotely, he had developed breakthrough atrial fibrillation on amiodarone 100 mg and he has been on 100 mg, alternating with 200 mg daily.  On this most recent monitor, there were no episodes of recurrent AF.   He has been on amiodarone 100 mg daily, Norvasc 2.5 mg, losartan 50 mg bid, HCTZ 12.5 mg daily, Toprol-XL 12.5 mg daily, Crestor 5 mg and Coumadin.  He also has noticed rare episodes of vertigo for which he takes meclizine.    He also has been on Uroxatral to improve urinary flow. When I last saw him we had a lengthy discussion concerning warfarin versus new or anticoagulants.  He opted to switch to Xarelto and has been maintained on 15 mg daily based on his creatinine clearance.  He has tolerated this well without bleeding.  He was evaluated by Samara Snide the office on 12/27/2015 and had noticed an occasional irregularity of his heartbeat.  When she evaluated him, he was still in sinus rhythm without ectopy and there were no acute changes.  Presently continues to feel that his pulse has been stable.  He denies chest pain.  He denies presyncope or syncope.  When I saw him in follow-up, he was in sinus rhythm.  Since I last saw him, he denies any awareness of heart rate irregularity. Dr. Shelia Media has checked his laboratory.  He denies any episodes of chest pain.  He denies presyncope or syncope.  He presents for evaluation.  Past Medical History:  Diagnosis Date  . Adrenal mass ([place])   . Asthma   . BPH (benign prostatic hyperplasia)   . CKD (chronic kidney disease), stage III   .  GERD (gastroesophageal reflux disease)   . H/O cardiovascular stress test 03/2012   EKG with 1-2 mm ST segment depression but normal perfusion images. Followup metastases test reduced exercise effort and functional status and determinate for myocardial dysfunction  . History of cardiac monitoring    a. 2015: cardiac monitor revealed episodes of PACs, bradycardia, with heart rates down to 48, 4 beat PSVT, 6 beat run of  NSVT at a rate of 131. There was no recurrent afib.   Marland Kitchen Hyperlipidemia LDL goal < 100   . Hypertension   . Hypothyroidism   . Macular degeneration   . MVP (mitral valve prolapse)    on cardiac cath - not mentioned on 2014 echo.  Marland Kitchen NSVT (nonsustained ventricular tachycardia) ([place])   . PAF (paroxysmal atrial fibrillation) ([place])    last episode 03/2012  . Pericarditis   . Premature atrial contractions   . Sinus bradycardia     Past Surgical History:  Procedure Laterality Date  . ABDOMINAL VASCULAR ULTRASOUND EVALUATION  12/20/2008   mild non-obstructive plaque, abdominal aorta, with mild calcification at the bifurcation-no obstruction, no evidence for aneurysm of the abdominal aorta  . CARDIAC CATHETERIZATION  01/04/1999   hyperdynamic LV function, probable angiographic mitral valve prolapse, systolic muscle bridging of the mid LAD without obstructive disease, systolic narrowing up to 22%  . Cardiopulmonary MET-test  07/20/2012   reduced exercise effort and functional status, peak max O2 comsumption was only 58% of predicted, cardiovascular status was interpreted as indeterminate for myocardial dysfunction due to the suboptimal peak cardiovascular stress load.    Allergies  Allergen Reactions  . Ciprofloxacin Other (See Comments)    Cannot take due to currently taking Amiodarone  . Hytrin [Terazosin] Other (See Comments)    Syncopal event     Current Outpatient Prescriptions  Medication Sig Dispense Refill  . acetaminophen (TYLENOL 8 HOUR ARTHRITIS PAIN) 650 MG CR tablet Take 650 mg by mouth every 8 (eight) hours as needed for pain.    Marland Kitchen alfuzosin (UROXATRAL) 10 MG 24 hr tablet Take 10 mg by mouth daily.    Marland Kitchen amiodarone (PACERONE) 200 MG tablet TAKE 1 TABLET (200 MG TOTAL) BY MOUTH DAILY. (Patient taking differently: 1/2 TABLET ONCE DAILY) 90 tablet 0  . amLODipine (NORVASC) 2.5 MG tablet TAKE 1 TABLET BY MOUTH EVERY DAY 90 tablet 3  . Cholecalciferol (VITAMIN D-3) 1000 UNITS CAPS  Take 1 capsule by mouth daily.     . finasteride (PROSCAR) 5 MG tablet Take 5 mg by mouth daily.    . fish oil-omega-3 fatty acids 1000 MG capsule Take 1 g by mouth 2 (two) times daily.    . hydrochlorothiazide (HYDRODIURIL) 25 MG tablet Take 12.5 mg by mouth daily.     Marland Kitchen levothyroxine (SYNTHROID, LEVOTHROID) 50 MCG tablet Take 1 tablet by mouth daily.  2  . losartan (COZAAR) 50 MG tablet TAKE 1 TABLET BY MOUTH TWICE A DAY 60 tablet 11  . magnesium hydroxide (MILK OF MAGNESIA) 800 MG/5ML suspension Take 5 mLs by mouth daily as needed for constipation.    . meclizine (ANTIVERT) 25 MG tablet Take 25 mg by mouth as needed.  2  . metoprolol tartrate (LOPRESSOR) 50 MG tablet TALE 1/2 TO 1 TABLETS AS NEEDED FOR PALPITATIONS 90 tablet 3  . Multiple Vitamins-Minerals (PRESERVISION AREDS) CAPS Take 1 capsule by mouth 2 (two) times daily. VITAMIN A FREE    . OVER THE COUNTER MEDICATION Apply 1 application topically daily.    Marland Kitchen  Polyethyl Glycol-Propyl Glycol (SYSTANE) 0.4-0.3 % SOLN Apply 2 drops to eye daily as needed (for dry eeys).    . polyethylene glycol (MIRALAX / GLYCOLAX) packet Take 17 g by mouth daily.    . Probiotic Product (PROBIOTIC DAILY PO) Take 1 tablet by mouth daily.    . Rivaroxaban (XARELTO) 15 MG TABS tablet Take 1 tablet (15 mg total) by mouth daily with supper. 30 tablet 11  . rosuvastatin (CRESTOR) 5 MG tablet TAKE 1 TABLET (5 MG TOTAL) BY MOUTH DAILY AT 6 PM. 30 tablet 6   No current facility-administered medications for this visit.     Social History   Social History  . Marital status: Married    Spouse name: N/A  . Number of children: N/A  . Years of education: N/A   Occupational History  . Not on file.   Social History Main Topics  . Smoking status: Never Smoker  . Smokeless tobacco: Never Used  . Alcohol use No  . Drug use: No  . Sexual activity: Not on file   Other Topics Concern  . Not on file   Social History Narrative  . No narrative on file     Social history is notable in that he is married. 2 children 4 grandchildren. No tobacco alcohol use.  ROS General: Negative; No fevers, chills, or night sweats;  HEENT: Negative; No changes in vision or hearing, sinus congestion, difficulty swallowing Pulmonary: Negative; No cough, wheezing, shortness of breath, hemoptysis Cardiovascular: Negative; No chest pain, presyncope, syncope, palpitations Edema has improved GI: Positive for GERD; No nausea, vomiting, diarrhea, or abdominal pain GU: Negative; No dysuria, hematuria, or difficulty voiding Musculoskeletal: Negative; no myalgias, joint pain, or weakness Hematologic/Oncology: Negative; no easy bruising, bleeding Endocrine: Negative; no heat/cold intolerance; no diabetes Neuro: Negative; no changes in balance, headaches Skin: Negative; No rashes or skin lesions Psychiatric: Negative; No behavioral problems, depression Sleep: Negative; No snoring, daytime sleepiness, hypersomnolence, bruxism, restless legs, hypnogognic hallucinations, no cataplexy Other comprehensive 14 point system review is negative.   PE BP 122/60   Pulse (!) 58   Ht '5\' 8"'  (1.727 m)   Wt 159 lb 12.8 oz (72.5 kg)   BMI 24.30 kg/m    Repeat blood pressure by me was 120/64.  Wt Readings from Last 3 Encounters:  08/13/16 159 lb 12.8 oz (72.5 kg)  02/13/16 164 lb (74.4 kg)  12/27/15 160 lb 9.6 oz (72.8 kg)   General: Alert, oriented, no distress.  Skin: normal turgor, no rashes HEENT: Normocephalic, atraumatic. Pupils round and reactive; sclera anicteric;no lid lag.  Nose without nasal septal hypertrophy Mouth/Parynx benign; Mallinpatti scale 3 Neck: No JVD, no carotid bruits with normal carotid upstroke No chest wall tenderness to palpation Lungs: clear to ausculatation and percussion; no wheezing or rales Heart: RRR, s1 s2 normal, with a 1/6 systolic murmur.  No diastolic murmur.  No rubs, thrills or heaves Abdomen: soft, nontender; no  hepatosplenomehaly, BS+; abdominal aorta nontender and not dilated by palpation. Back: No CVA tenderness Pulses 2+ Extremities: Trivial ankle edema, left greater than right;, no clubbing cyanosis, Homan's sign negative  Neurologic: grossly nonfocal Psychological: Normal cognitive function, normal affect and mood  ECG (independently read by me): Sinus bradycardia 58 bpm.  Nonspecific ST changes.  Normal intervals.  September 2017 ECG (independently read by me): Sinus rhythm at 68.  Normal intervals.  No significant ST-T changes.  December 2016 ECG (independently read by me): Normal sinus rhythm at 61 bpm.  No ectopy.  QTc interval 434 ms.  June 2016 ECG (independently read by me): Sinus bradycardia 52 bpm.  QTc interval 407 ms.  PR interval 158 msec  December 2015 ECG (independently read by me): Sinus bradycardia at 49 bpm.  Nonspecific ST changes.  QTc interval 424 ms.  ECG (independently read by me): Sinus bradycardia 50 beats per minute.  Nonspecific ST changes.    Prior ECG: Sinus bradycardia at 50 beats per minute. No ectopy on ECG. Intervals normal  LABS: BMP Latest Ref Rng & Units 01/08/2016 12/27/2015 12/28/2014  Glucose 65 - 99 mg/dL 94 95 94  BUN 7 - 25 mg/dL '24 24 21  ' Creatinine 0.70 - 1.18 mg/dL 1.49(H) 1.59(H) 1.40(H)  Sodium 135 - 146 mmol/L 141 138 139  Potassium 3.5 - 5.3 mmol/L 4.6 4.2 4.6  Chloride 98 - 110 mmol/L 105 104 103  CO2 20 - 31 mmol/L '27 24 26  ' Calcium 8.6 - 10.3 mg/dL 8.8 8.7 9.0   Hepatic Function Latest Ref Rng & Units 12/28/2014 01/05/2014  Total Protein 6.1 - 8.1 g/dL 6.2 6.7  Albumin 3.6 - 5.1 g/dL 3.7 4.4  AST 10 - 35 U/L 17 15  ALT 9 - 46 U/L 15 15  Alk Phosphatase 40 - 115 U/L 117(H) 107  Total Bilirubin 0.2 - 1.2 mg/dL 0.6 0.4   CBC Latest Ref Rng & Units 12/27/2015 12/28/2014 01/05/2014  WBC 3.8 - 10.8 K/uL 5.0 4.7 5.2  Hemoglobin 13.2 - 17.1 g/dL 13.2 13.3 13.9  Hematocrit 38.5 - 50.0 % 38.6 39.5 39.7  Platelets 140 - 400 K/uL 158 173 155    Lab Results  Component Value Date   MCV 100.5 (H) 12/27/2015   MCV 100.0 12/28/2014   MCV 98.3 01/05/2014   Lab Results  Component Value Date   TSH 2.52 12/27/2015   Lipid Panel     Component Value Date/Time   CHOL 120 (L) 12/28/2014 0924   TRIG 85 12/28/2014 0924   HDL 49 12/28/2014 0924   CHOLHDL 2.4 12/28/2014 0924   VLDL 17 12/28/2014 0924   LDLCALC 54 12/28/2014 0924     RADIOLOGY: No results found.  IMPRESSION:  1. Murmur   2. Paroxysmal atrial fibrillation (HCC)   3. Sinus bradycardia   4. Chronic anticoagulation   5. Essential hypertension   6. Hypothyroidism, unspecified type   7. CKD (chronic kidney disease), stage III     ASSESSMENT AND PLAN: Ms. Luhman is a 76 year old gentleman who has a history of hypertension, hyperlipidemia, hypothyroidism, mitral valve prolapse, as well as paroxysmal atrial fibrillation.  He continues to maintain sinus rhythm without recurrent atrial fibrillation.  His blood pressure today is stable on his current regimen consisting of losartan 50 mg twice a day, amlodipine 2.5 mg daily and HCTZ 12.5 mg.  He continues to be on amiodarone at 100 mg without recurrent atrial fibrillation.  He has a prescription for metoprolol but has not needed to take this on a when necessary basis.  He has a history of hypothyroidism.  He continues to take levothyroxine at 50 g.  He is on Crestor 5 mg for hyperlipidemia.  He is now on Xarelto at 15 mg for his systemic anticoagulation.  He has a history of stage III chronic kidney disease.  Laboratory in August 2017 showed a creatinine of 1.49, which had slightly improved from 1.59 in July 2017.  He tells me Dr. Shelia Media will be rechecking his laboratory in the very near future.  Last that these be  sent to me for my review.  His last 2-D echo Doppler study was in January 2014.  His cardiac murmur and history of PAF, I am rechecking his echo Doppler study.  I will see him in 6 months for cardiology reevaluation.     Time spent: 25 minutes Troy Sine, MD, [facility]  08/14/2016 9:05 AM

## 2016-08-13 NOTE — Patient Instructions (Signed)
Medication Instructions:   NO CHANGE  Testing/Procedures:  Your physician has requested that you have an echocardiogram. Echocardiography is a painless test that uses sound waves to create images of your heart. It provides your doctor with information about the size and shape of your heart and how well your heart's chambers and valves are working. This procedure takes approximately one hour. There are no restrictions for this procedure. PRIOR TO APPOINTMENT IN 6 MONTHS    Follow-Up:  Your physician wants you to follow-up in: [place] will receive a reminder letter in the mail two months in advance. If you don't receive a letter, please call our office to schedule the follow-up appointment.   If you need a refill on your cardiac medications before your next appointment, please call your pharmacy.

## 2016-08-15 ENCOUNTER — Encounter (HOSPITAL_COMMUNITY): Payer: Self-pay | Admitting: Pharmacy Technician

## 2016-08-15 ENCOUNTER — Telehealth: Payer: Self-pay | Admitting: Cardiovascular Disease

## 2016-08-15 ENCOUNTER — Emergency Department (HOSPITAL_COMMUNITY)
Admission: EM | Admit: 2016-08-15 | Discharge: 2016-08-15 | Disposition: A | Discharge status: Home / Self Care | Payer: Medicare Other | Attending: Emergency Medicine | Admitting: Emergency Medicine

## 2016-08-15 DIAGNOSIS — Z8551 Personal history of malignant neoplasm of bladder: Secondary | ICD-10-CM | POA: Diagnosis not present

## 2016-08-15 DIAGNOSIS — J45909 Unspecified asthma, uncomplicated: Secondary | ICD-10-CM | POA: Insufficient documentation

## 2016-08-15 DIAGNOSIS — Z7901 Long term (current) use of anticoagulants: Secondary | ICD-10-CM | POA: Diagnosis not present

## 2016-08-15 DIAGNOSIS — R000 Tachycardia, unspecified: Secondary | ICD-10-CM | POA: Diagnosis not present

## 2016-08-15 DIAGNOSIS — I4891 Unspecified atrial fibrillation: Secondary | ICD-10-CM | POA: Insufficient documentation

## 2016-08-15 DIAGNOSIS — N183 Chronic kidney disease, stage 3 (moderate): Secondary | ICD-10-CM | POA: Insufficient documentation

## 2016-08-15 DIAGNOSIS — I129 Hypertensive chronic kidney disease with stage 1 through stage 4 chronic kidney disease, or unspecified chronic kidney disease: Secondary | ICD-10-CM | POA: Diagnosis not present

## 2016-08-15 DIAGNOSIS — R002 Palpitations: Secondary | ICD-10-CM | POA: Diagnosis present

## 2016-08-15 DIAGNOSIS — E039 Hypothyroidism, unspecified: Secondary | ICD-10-CM | POA: Diagnosis not present

## 2016-08-15 LAB — BASIC METABOLIC PANEL
ANION GAP: 10 (ref 5–15)
BUN: 29 mg/dL — ABNORMAL HIGH (ref 6–20)
CALCIUM: 8.2 mg/dL — AB (ref 8.9–10.3)
CO2: 22 mmol/L (ref 22–32)
CREATININE: 1.48 mg/dL — AB (ref 0.61–1.24)
Chloride: 106 mmol/L (ref 101–111)
GFR, EST AFRICAN AMERICAN: 51 mL/min — AB (ref >60)
GFR, EST NON AFRICAN AMERICAN: 44 mL/min — AB (ref >60)
Glucose, Bld: 99 mg/dL (ref 65–99)
Potassium: 3.4 mmol/L — ABNORMAL LOW (ref 3.5–5.1)
SODIUM: 138 mmol/L (ref 135–145)

## 2016-08-15 LAB — CBC WITH DIFFERENTIAL/PLATELET
BASOS ABS: 0 10*3/uL (ref 0.0–0.1)
BASOS PCT: 0 %
EOS ABS: 0.1 10*3/uL (ref 0.0–0.7)
Eosinophils Relative: 2 %
HEMATOCRIT: 34.9 % — AB (ref 39.0–52.0)
Hemoglobin: 11.9 g/dL — ABNORMAL LOW (ref 13.0–17.0)
Lymphocytes Relative: 20 %
Lymphs Abs: 1.1 10*3/uL (ref 0.7–4.0)
MCH: 34.3 pg — ABNORMAL HIGH (ref 26.0–34.0)
MCHC: 34.1 g/dL (ref 30.0–36.0)
MCV: 100.6 fL — ABNORMAL HIGH (ref 78.0–100.0)
MONO ABS: 0.7 10*3/uL (ref 0.1–1.0)
MONOS PCT: 12 %
NEUTROS ABS: 3.7 10*3/uL (ref 1.7–7.7)
NEUTROS PCT: 66 %
Platelets: 156 10*3/uL (ref 150–400)
RBC: 3.47 MIL/uL — ABNORMAL LOW (ref 4.22–5.81)
RDW: 12.7 % (ref 11.5–15.5)
WBC: 5.6 10*3/uL (ref 4.0–10.5)

## 2016-08-15 MED ORDER — SODIUM CHLORIDE 0.9 % IV BOLUS (SEPSIS)
500.0000 mL | Freq: Once | INTRAVENOUS | Status: AC
  Administered 2016-08-15: 500 mL via INTRAVENOUS

## 2016-08-15 MED ORDER — METOPROLOL TARTRATE 5 MG/5ML IV SOLN
5.0000 mg | Freq: Once | INTRAVENOUS | Status: AC
Start: 2016-08-15 — End: 2016-08-15
  Administered 2016-08-15: 5 mg via INTRAVENOUS
  Filled 2016-08-15: qty 5

## 2016-08-15 NOTE — Telephone Encounter (Signed)
Patient stated that he went to the ED by EMS last night for AFIB RVR. He stated that his heart rate was from the 130's-180's. He did not take his PRN Metoprolol. The ED was able to get his heart rate below the 100's by administering Metoprolol 5mg  IV. Per patient his systolic went down to 99 after the metoprolol. He states that he is currently in AFIB but that he cannot take his blood pressure due to a machine malfunction. His wife checked his heart rate and stated that it was approximately 100.  Will forward to the DOD, Dr. [place] for review.

## 2016-08-15 NOTE — ED Notes (Signed)
Nurse drawing labs. 

## 2016-08-15 NOTE — ED Provider Notes (Signed)
[facility] DEPT Provider Note   CSN: [id number] Arrival date & time: 08/15/16  0127  By signing my name below, I, Arianna Nassar, attest that this documentation has been prepared under the direction and in the presence of Fatima Blank, MD.  Electronically Signed: Julien Nordmann, ED Scribe. 08/15/16. 2:10 AM.    History   Chief Complaint Chief Complaint  Patient presents with  . Atrial Fibrillation   The history is provided by the patient and the EMS personnel. No language interpreter was used.   HPI Comments: Tanner Ho is a 76 y.o. male brought in by ambulance, who has a PMhx of HLD, MVP, GERD, HTN, hypothyroidism, NSVT, and stage III CKD presents to the Emergency Department complaining of sudden onset, moderate palpitations that began ~ 2 hours ago. Pt was laying down in bed when he began to feel his heart race. Pt is currently on Xarelto and Amiodarone for his a-fib. Pt does not have a hx of CHF. He denies chest pain, shortness of breath, nausea, vomiting, abdominal pain, leg swelling, rhinorrhea, cough, congestin, fever, or chills.   Past Medical History:  Diagnosis Date  . 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 Asthma   . BPH (benign prostatic hyperplasia)   . CKD (chronic kidney disease), stage III   . GERD (gastroesophageal reflux disease)   . H/O cardiovascular stress test 03/2012   EKG with 1-2 mm ST segment depression but normal perfusion images. Followup metastases test reduced exercise effort and functional status and determinate for myocardial dysfunction  . History of cardiac monitoring    a. 2015: cardiac monitor revealed episodes of PACs, bradycardia, with heart rates down to 48, 4 beat PSVT, 6 beat run of NSVT at a rate of 131. There was no recurrent afib.   Marland Kitchen Hyperlipidemia LDL goal < 100   . Hypertension   . Hypothyroidism   . Macular degeneration   . MVP (mitral valve prolapse)    on cardiac cath - not mentioned on 2014 echo.  Marland Kitchen NSVT (nonsustained  ventricular tachycardia) ([place])   . PAF (paroxysmal atrial fibrillation) ([place])    last episode 03/2012  . Pericarditis   . Premature atrial contractions   . Sinus bradycardia     Patient Active Problem List   Diagnosis Date Noted  . Sinus bradycardia 05/08/2014  . NSVT (nonsustained ventricular tachycardia) ([place]) 03/05/2014  . Chest pain at rest 04/16/2013  . Edema of foot 01/06/2013  . PAF (paroxysmal atrial fibrillation), recurrent 03/12/12 03/13/2012  . Chronic anticoagulation 03/13/2012  . HTN (hypertension) 03/13/2012  . Chronic renal disease, stage III, baseline SCr 1.5 03/13/2012  . Dyslipidemia 03/13/2012  . Hypothyroid 03/13/2012  . Bladder cancer ([place]) 03/13/2012  . Normal coronary arteries March 2000 03/13/2012    Past Surgical History:  Procedure Laterality Date  . ABDOMINAL VASCULAR ULTRASOUND EVALUATION  12/20/2008   mild non-obstructive plaque, abdominal aorta, with mild calcification at the bifurcation-no obstruction, no evidence for aneurysm of the abdominal aorta  . CARDIAC CATHETERIZATION  01/04/1999   hyperdynamic LV function, probable angiographic mitral valve prolapse, systolic muscle bridging of the mid LAD without obstructive disease, systolic narrowing up to 81%  . Cardiopulmonary MET-test  07/20/2012   reduced exercise effort and functional status, peak max O2 comsumption was only 58% of predicted, cardiovascular status was interpreted as indeterminate for myocardial dysfunction due to the suboptimal peak cardiovascular stress load.       Home Medications    Prior to Admission medications  Medication Sig Start Date End Date Taking? Authorizing Provider  acetaminophen (TYLENOL 8 HOUR ARTHRITIS PAIN) 650 MG CR tablet Take 650 mg by mouth every 8 (eight) hours as needed for pain.   Yes Historical Provider, MD  acetaminophen (TYLENOL) 500 MG tablet Take 500 mg by mouth every 6 (six) hours as needed for mild pain.   Yes Historical Provider, MD  alfuzosin  (UROXATRAL) 10 MG 24 hr tablet Take 10 mg by mouth daily.   Yes Historical Provider, MD  amiodarone (PACERONE) 200 MG tablet TAKE 1 TABLET (200 MG TOTAL) BY MOUTH DAILY. Patient taking differently: 1/2 TABLET ONCE DAILY 05/28/16  Yes Troy Sine, MD  amLODipine (NORVASC) 2.5 MG tablet TAKE 1 TABLET BY MOUTH EVERY DAY 04/10/16  Yes Troy Sine, MD  Cholecalciferol (VITAMIN D-3) 1000 UNITS CAPS Take 2 capsules by mouth daily.    Yes Historical Provider, MD  docusate sodium (COLACE) 100 MG capsule Take 100 mg by mouth daily.   Yes Historical Provider, MD  finasteride (PROSCAR) 5 MG tablet Take 5 mg by mouth daily.   Yes Historical Provider, MD  fish oil-omega-3 fatty acids 1000 MG capsule Take 1 g by mouth 2 (two) times daily.   Yes Historical Provider, MD  hydrochlorothiazide (HYDRODIURIL) 25 MG tablet Take 12.5 mg by mouth daily.    Yes Historical Provider, MD  levothyroxine (SYNTHROID, LEVOTHROID) 50 MCG tablet Take 1 tablet by mouth daily. 04/22/14  Yes Historical Provider, MD  loratadine (CLARITIN) 10 MG tablet Take 10 mg by mouth daily as needed for allergies.   Yes Historical Provider, MD  losartan (COZAAR) 50 MG tablet TAKE 1 TABLET BY MOUTH TWICE A DAY 02/13/16  Yes Leonie Man, MD  magnesium hydroxide (MILK OF MAGNESIA) 800 MG/5ML suspension Take 5 mLs by mouth daily as needed for constipation.   Yes Historical Provider, MD  meclizine (ANTIVERT) 25 MG tablet Take 25 mg by mouth 3 (three) times daily as needed for dizziness.  09/06/14  Yes Historical Provider, MD  metoprolol tartrate (LOPRESSOR) 50 MG tablet TALE 1/2 TO 1 TABLETS AS NEEDED FOR PALPITATIONS 02/13/16  Yes Troy Sine, MD  Multiple Vitamins-Minerals (PRESERVISION AREDS) CAPS Take 1 capsule by mouth 2 (two) times daily. VITAMIN A FREE   Yes Historical Provider, MD  Polyethyl Glycol-Propyl Glycol (SYSTANE) 0.4-0.3 % SOLN Apply 2 drops to eye daily as needed (for dry eeys).   Yes Historical Provider, MD  polyethylene glycol  (MIRALAX / GLYCOLAX) packet Take 17 g by mouth daily as needed for mild constipation.    Yes Historical Provider, MD  ranitidine (ZANTAC) 150 MG tablet Take 150 mg by mouth daily as needed for heartburn.   Yes Historical Provider, MD  Rivaroxaban (XARELTO) 15 MG TABS tablet Take 1 tablet (15 mg total) by mouth daily with supper. 08/15/15  Yes Troy Sine, MD  rosuvastatin (CRESTOR) 5 MG tablet TAKE 1 TABLET (5 MG TOTAL) BY MOUTH DAILY AT 6 PM. 03/25/16  Yes Troy Sine, MD    Family History Family History  Problem Relation Age of Onset  . Stroke Mother   . Stroke Paternal Grandfather     Social History Social History  Substance Use Topics  . Smoking status: Never Smoker  . Smokeless tobacco: Never Used  . Alcohol use No     Allergies   Ciprofloxacin and Hytrin [terazosin]   Review of Systems Review of Systems  A complete 10 system review of systems was obtained and all  systems are negative except as noted in the HPI and PMH.   Physical Exam Updated Vital Signs BP 105/72   Pulse 88   Temp 98.1 F (36.7 C) (Oral)   Resp 14   Ht _0  (1.727 m)   Wt 159 lb (72.1 kg)   SpO2 97%   BMI 24.18 kg/m   Physical Exam  Constitutional: He is oriented to person, place, and time. He appears well-developed and well-nourished. No distress.  HENT:  Head: Normocephalic and atraumatic.  Nose: Nose normal.  Eyes: Conjunctivae and EOM are normal. Pupils are equal, round, and reactive to light. Right eye exhibits no discharge. Left eye exhibits no discharge. No scleral icterus.  Neck: Normal range of motion. Neck supple.  Cardiovascular: An irregularly irregular rhythm present. Tachycardia present.  Exam reveals no gallop and no friction rub.   No murmur heard. Pulmonary/Chest: Effort normal and breath sounds normal. No stridor. No respiratory distress. He has no rales.  Abdominal: Soft. He exhibits no distension. There is no tenderness.  Musculoskeletal: He exhibits no edema or  tenderness.  Neurological: He is alert and oriented to person, place, and time.  Skin: Skin is warm and dry. No rash noted. He is not diaphoretic. No erythema.  Psychiatric: He has a normal mood and affect.  Vitals reviewed.    ED Treatments / Results  DIAGNOSTIC STUDIES: Oxygen Saturation is 100% on RA, normal by my interpretation.  COORDINATION OF CARE:  2:06 AM Discussed treatment plan with pt at bedside and pt agreed to plan.  Labs (all labs ordered are listed, but only abnormal results are displayed) Labs Reviewed  CBC WITH DIFFERENTIAL/PLATELET - Abnormal; Notable for the following:       Result Value   RBC 3.47 (*)    Hemoglobin 11.9 (*)    HCT 34.9 (*)    MCV 100.6 (*)    MCH 34.3 (*)    All other components within normal limits  BASIC METABOLIC PANEL - Abnormal; Notable for the following:    Potassium 3.4 (*)    BUN 29 (*)    Creatinine, Ser 1.48 (*)    Calcium 8.2 (*)    GFR calc non Af Amer 44 (*)    GFR calc Af Amer 51 (*)    All other components within normal limits    EKG  EKG Interpretation  Date/Time:  Thursday August 15 2016 01:34:56 EDT Ventricular Rate:  129 PR Interval:    QRS Duration: 90 QT Interval:  320 QTC Calculation: 469 R Axis:   -7 Text Interpretation:  Atrial fibrillation Repol abnrm suggests ischemia, diffuse leads Confirmed by [facility] MD, PEDRO ([id number]) on 08/15/2016 3:26:10 AM       Radiology No results found.  Procedures Procedures (including critical care time) CRITICAL CARE Performed by: Fatima Blank, MD  Total critical care time: 30 minutes  Critical care time was exclusive of separately billable procedures and treating other patients.  Critical care was necessary to treat or prevent imminent or life-threatening deterioration.  Critical care was time spent personally by me on the following activities: development of treatment plan with patient and/or surrogate as well as nursing, discussions with consultants,  evaluation of patient's response to treatment, examination of patient, obtaining history from patient or surrogate, ordering and performing treatments and interventions, ordering and review of laboratory studies, ordering and review of radiographic studies, pulse oximetry and re-evaluation of patient's condition.  Medications Ordered in ED Medications  sodium chloride 0.9 % bolus  500 mL (0 mLs Intravenous Stopped 08/15/16 0337)  metoprolol (LOPRESSOR) injection 5 mg (5 mg Intravenous Given 08/15/16 0228)     Initial Impression / Assessment and Plan / ED Course  I have reviewed the triage vital signs and the nursing notes.  Pertinent labs & imaging results that were available during my care of the patient were reviewed by me and considered in my medical decision making (see chart for details).  Clinical Course as of Aug 15 717  Thu Aug 15, 2016  0208 AFib RVR at rate of 129. On Amio and prn Metop; has not taken the metop. No chest pain or SOB. No reported h/o CHF. No recent infections or dehydration.  Will get labs and give small bolus with IV metop 43m   [PC]  0235 Rate is now controlled after the 5 mg of metoprolol. Will monitor for several hours to ensure stability  [PC]  00300Patient's rate has maintained below 100. He remained hemodynamically stable throughout his stay. Feel he is appropriate for discharge with strict return precautions. Instructed to contact his cardiologist later on today to inform him of the incident and see whether they would like to alter his amiodarone dose.   [PC]    Clinical Course User Index [PC] PFatima Blank MD      Final Clinical Impressions(s) / ED Diagnoses   Final diagnoses:  Atrial fibrillation with RVR ([place]   Disposition: Discharge  Condition: Good  I have discussed the results, Dx and Tx plan with the patient who expressed understanding and agree(s) with the plan. Discharge instructions discussed at great length. The patient was  given strict return precautions who verbalized understanding of the instructions. No further questions at time of discharge.    Discharge Medication List as of 08/15/2016  4:36 AM      Follow Up: WDeland Pretty MD [address] [phone] Schedule an appointment as soon as possible for a visit  As needed  TTroy Sine MD [address] [place]GNorth [place] [phone] Call today Informed them that you were seen in the emergency department for atrial fibrillation at a high rate. We were able to improve the rate however you're still in A. fib. See if they would like to alter your amiodarone dosage.   I personally performed the services described in this documentation, which was scribed in my presence. The recorded information has been reviewed and is accurate.        PFatima Blank MD 08/15/16 [phone]

## 2016-08-15 NOTE — ED Triage Notes (Addendum)
Pt arrives to the ed via ems with reports of feeling as if his heart is racing. Per ems pt in afib with rate varying from 110-180. Pt denies cp sob N/V. Pt takes xarelto. Pt VSS. EMS attempted vagal maneuver with no response.

## 2016-08-15 NOTE — ED Notes (Signed)
Pt ambulated self efficiently to restroom with no difficulty. 

## 2016-08-15 NOTE — Telephone Encounter (Signed)
New Message  Pt voiced went to ED last night w/afib wanted to let MD know.

## 2016-08-15 NOTE — Telephone Encounter (Signed)
I would increase amiodarone to 200 mg daily for one week then 100 mg daily. If Afib persists he may need follow up in office sooner. Just saw Dr. Claiborne Billings 2 days ago.   Peter Martinique MD, [facility]

## 2016-08-15 NOTE — Telephone Encounter (Signed)
Called patient back to advise him what Dr. Martinique had suggested. He stated that he understood and would call if he remained in atrial fibrillation. Marland Kitchen

## 2016-08-16 ENCOUNTER — Encounter (HOSPITAL_COMMUNITY): Payer: Self-pay | Admitting: Emergency Medicine

## 2016-08-16 ENCOUNTER — Emergency Department (HOSPITAL_COMMUNITY)
Admission: EM | Admit: 2016-08-16 | Discharge: 2016-08-17 | Disposition: A | Discharge status: Home / Self Care | Payer: Medicare Other | Attending: Emergency Medicine | Admitting: Emergency Medicine

## 2016-08-16 DIAGNOSIS — Z79899 Other long term (current) drug therapy: Secondary | ICD-10-CM | POA: Diagnosis not present

## 2016-08-16 DIAGNOSIS — I4891 Unspecified atrial fibrillation: Secondary | ICD-10-CM | POA: Diagnosis not present

## 2016-08-16 DIAGNOSIS — R000 Tachycardia, unspecified: Secondary | ICD-10-CM | POA: Diagnosis not present

## 2016-08-16 DIAGNOSIS — E039 Hypothyroidism, unspecified: Secondary | ICD-10-CM | POA: Insufficient documentation

## 2016-08-16 DIAGNOSIS — J45909 Unspecified asthma, uncomplicated: Secondary | ICD-10-CM | POA: Diagnosis not present

## 2016-08-16 DIAGNOSIS — Z7901 Long term (current) use of anticoagulants: Secondary | ICD-10-CM | POA: Diagnosis not present

## 2016-08-16 DIAGNOSIS — R008 Other abnormalities of heart beat: Secondary | ICD-10-CM | POA: Diagnosis present

## 2016-08-16 DIAGNOSIS — Z8551 Personal history of malignant neoplasm of bladder: Secondary | ICD-10-CM | POA: Insufficient documentation

## 2016-08-16 DIAGNOSIS — I129 Hypertensive chronic kidney disease with stage 1 through stage 4 chronic kidney disease, or unspecified chronic kidney disease: Secondary | ICD-10-CM | POA: Diagnosis not present

## 2016-08-16 DIAGNOSIS — N183 Chronic kidney disease, stage 3 (moderate): Secondary | ICD-10-CM | POA: Diagnosis not present

## 2016-08-16 NOTE — ED Triage Notes (Signed)
Pt to ED via [facility] with reports of A-Fib RVR.  Pt was seen on the 14th for same and was told to increase his Amiodarone which he did.  Pt st's today while at rest sitting in a chair he felt his heart start racing.  Pt denies any chest pain.

## 2016-08-17 DIAGNOSIS — I4891 Unspecified atrial fibrillation: Secondary | ICD-10-CM | POA: Diagnosis not present

## 2016-08-17 MED ORDER — METOPROLOL TARTRATE 5 MG/5ML IV SOLN
2.5000 mg | Freq: Once | INTRAVENOUS | Status: AC
  Administered 2016-08-17: 2.5 mg via INTRAVENOUS
  Filled 2016-08-17: qty 5

## 2016-08-17 NOTE — ED Provider Notes (Signed)
[facility] DEPT Provider Note   CSN: [id number] Arrival date & time: 08/16/16  2223 By signing my name below, I, Tanner Ho, attest that this documentation has been prepared under the direction and in the presence of Tanner Fuel, MD . Electronically Signed: Dyke Ho, Scribe. 08/17/2016. 1:06 AM.   History   Chief Complaint Chief Complaint  Patient presents with  . Irregular Heart Beat   HPI Tanner Ho is a 76 y.o. male with a history of CKD stage III, HTN, HLD, MVP and NSVT who presents to the Emergency Department complaining of sudden onset, moderate palpitations that began at about 6 this evening. Pt states he had just eaten and sat down in his recliner when he felt his heart began to race. He reports associated generalized weakness. No alleviating or modifying factors noted; no treatments tried PTA. Pt was seen in the ED two days ago for the same where he was given metoprolol with significant relief of symptoms. Per pt, he has not taken any metoprolol since d/c. Pt is currently on Amiodarone for a-fib and recently had his dose increased to 200 mg. He denies any difficulty breathing, SOB, CP, chest tightness, nausea, vomiting, diaphoresis or any other associated symptoms.   The history is provided by the patient. No language interpreter was used.   Past Medical History:  Diagnosis Date  . Adrenal mass ([place])   . Asthma   . BPH (benign prostatic hyperplasia)   . CKD (chronic kidney disease), stage III   . GERD (gastroesophageal reflux disease)   . H/O cardiovascular stress test 03/2012   EKG with 1-2 mm ST segment depression but normal perfusion images. Followup metastases test reduced exercise effort and functional status and determinate for myocardial dysfunction  . History of cardiac monitoring    a. 2015: cardiac monitor revealed episodes of PACs, bradycardia, with heart rates down to 48, 4 beat PSVT, 6 beat run of NSVT at a rate of 131. There was no recurrent afib.    Marland Kitchen Hyperlipidemia LDL goal < 100   . Hypertension   . Hypothyroidism   . Macular degeneration   . MVP (mitral valve prolapse)    on cardiac cath - not mentioned on 2014 echo.  Marland Kitchen NSVT (nonsustained ventricular tachycardia) ([place])   . PAF (paroxysmal atrial fibrillation) ([place])    last episode 03/2012  . Pericarditis   . Premature atrial contractions   . Sinus bradycardia     Patient Active Problem List   Diagnosis Date Noted  . Sinus bradycardia 05/08/2014  . NSVT (nonsustained ventricular tachycardia) ([place]) 03/05/2014  . Chest pain at rest 04/16/2013  . Edema of foot 01/06/2013  . PAF (paroxysmal atrial fibrillation), recurrent 03/12/12 03/13/2012  . Chronic anticoagulation 03/13/2012  . HTN (hypertension) 03/13/2012  . Chronic renal disease, stage III, baseline SCr 1.5 03/13/2012  . Dyslipidemia 03/13/2012  . Hypothyroid 03/13/2012  . Bladder cancer ([place]) 03/13/2012  . Normal coronary arteries March 2000 03/13/2012    Past Surgical History:  Procedure Laterality Date  . ABDOMINAL VASCULAR ULTRASOUND EVALUATION  12/20/2008   mild non-obstructive plaque, abdominal aorta, with mild calcification at the bifurcation-no obstruction, no evidence for aneurysm of the abdominal aorta  . CARDIAC CATHETERIZATION  01/04/1999   hyperdynamic LV function, probable angiographic mitral valve prolapse, systolic muscle bridging of the mid LAD without obstructive disease, systolic narrowing up to 51%  . Cardiopulmonary MET-test  07/20/2012   reduced exercise effort and functional status, peak max O2 comsumption was only 58%  of predicted, cardiovascular status was interpreted as indeterminate for myocardial dysfunction due to the suboptimal peak cardiovascular stress load.    Home Medications    Prior to Admission medications   Medication Sig Start Date End Date Taking? Authorizing Provider  acetaminophen (TYLENOL 8 HOUR ARTHRITIS PAIN) 650 MG CR tablet Take 650 mg by mouth every 8 (eight) hours as  needed for pain.    Historical Provider, MD  acetaminophen (TYLENOL) 500 MG tablet Take 500 mg by mouth every 6 (six) hours as needed for mild pain.    Historical Provider, MD  alfuzosin (UROXATRAL) 10 MG 24 hr tablet Take 10 mg by mouth daily.    Historical Provider, MD  amiodarone (PACERONE) 200 MG tablet TAKE 1 TABLET (200 MG TOTAL) BY MOUTH DAILY. Patient taking differently: 1/2 TABLET ONCE DAILY 05/28/16   Troy Sine, MD  amLODipine (NORVASC) 2.5 MG tablet TAKE 1 TABLET BY MOUTH EVERY DAY 04/10/16   Troy Sine, MD  Cholecalciferol (VITAMIN D-3) 1000 UNITS CAPS Take 2 capsules by mouth daily.     Historical Provider, MD  docusate sodium (COLACE) 100 MG capsule Take 100 mg by mouth daily.    Historical Provider, MD  finasteride (PROSCAR) 5 MG tablet Take 5 mg by mouth daily.    Historical Provider, MD  fish oil-omega-3 fatty acids 1000 MG capsule Take 1 g by mouth 2 (two) times daily.    Historical Provider, MD  hydrochlorothiazide (HYDRODIURIL) 25 MG tablet Take 12.5 mg by mouth daily.     Historical Provider, MD  levothyroxine (SYNTHROID, LEVOTHROID) 50 MCG tablet Take 1 tablet by mouth daily. 04/22/14   Historical Provider, MD  loratadine (CLARITIN) 10 MG tablet Take 10 mg by mouth daily as needed for allergies.    Historical Provider, MD  losartan (COZAAR) 50 MG tablet TAKE 1 TABLET BY MOUTH TWICE A DAY 02/13/16   Leonie Man, MD  magnesium hydroxide (MILK OF MAGNESIA) 800 MG/5ML suspension Take 5 mLs by mouth daily as needed for constipation.    Historical Provider, MD  meclizine (ANTIVERT) 25 MG tablet Take 25 mg by mouth 3 (three) times daily as needed for dizziness.  09/06/14   Historical Provider, MD  metoprolol tartrate (LOPRESSOR) 50 MG tablet TALE 1/2 TO 1 TABLETS AS NEEDED FOR PALPITATIONS 02/13/16   Troy Sine, MD  Multiple Vitamins-Minerals (PRESERVISION AREDS) CAPS Take 1 capsule by mouth 2 (two) times daily. VITAMIN A FREE    Historical Provider, MD  Polyethyl  Glycol-Propyl Glycol (SYSTANE) 0.4-0.3 % SOLN Apply 2 drops to eye daily as needed (for dry eeys).    Historical Provider, MD  polyethylene glycol (MIRALAX / GLYCOLAX) packet Take 17 g by mouth daily as needed for mild constipation.     Historical Provider, MD  ranitidine (ZANTAC) 150 MG tablet Take 150 mg by mouth daily as needed for heartburn.    Historical Provider, MD  Rivaroxaban (XARELTO) 15 MG TABS tablet Take 1 tablet (15 mg total) by mouth daily with supper. 08/15/15   Troy Sine, MD  rosuvastatin (CRESTOR) 5 MG tablet TAKE 1 TABLET (5 MG TOTAL) BY MOUTH DAILY AT 6 PM. 03/25/16   Troy Sine, MD    Family History Family History  Problem Relation Age of Onset  . Stroke Mother   . Stroke Paternal Grandfather     Social History Social History  Substance Use Topics  . Smoking status: Never Smoker  . Smokeless tobacco: Never Used  . Alcohol  use No    Allergies   Ciprofloxacin and Hytrin [terazosin]  Review of Systems Review of Systems  Constitutional: Negative for diaphoresis.  Respiratory: Negative for chest tightness and shortness of breath.   Cardiovascular: Positive for palpitations. Negative for chest pain.  Gastrointestinal: Negative for nausea and vomiting.  Neurological: Positive for weakness.  All other systems reviewed and are negative.  Physical Exam Updated Vital Signs BP 108/60 (BP Location: Right Arm)   Pulse (!) 133   Temp 97.8 F (36.6 C) (Oral)   Resp (!) 23   Ht '5\' 8"'  (1.727 m)   Wt 159 lb (72.1 kg)   SpO2 100%   BMI 24.18 kg/m   Physical Exam  Constitutional: He is oriented to person, place, and time. He appears well-developed and well-nourished.  HENT:  Head: Normocephalic and atraumatic.  Eyes: EOM are normal. Pupils are equal, round, and reactive to light.  Neck: Normal range of motion. Neck supple. No JVD present.  Cardiovascular: Normal heart sounds.  An irregular rhythm present. Tachycardia present.   No murmur  heard. Pulmonary/Chest: Effort normal and breath sounds normal. He has no wheezes. He has no rales. He exhibits no tenderness.  Abdominal: Soft. Bowel sounds are normal. He exhibits no distension and no mass. There is no tenderness.  Musculoskeletal: Normal range of motion. He exhibits no edema.  Lymphadenopathy:    He has no cervical adenopathy.  Neurological: He is alert and oriented to person, place, and time. No cranial nerve deficit. He exhibits normal muscle tone. Coordination normal.  Skin: Skin is warm and dry. No rash noted.  Psychiatric: He has a normal mood and affect. His behavior is normal. Judgment and thought content normal.  Nursing note and vitals reviewed.  ED Treatments / Results  DIAGNOSTIC STUDIES:  Oxygen Saturation is 100% on RA, normal by my interpretation.    COORDINATION OF CARE:  1:05 AM Discussed treatment plan with pt at bedside and pt agreed to plan.   EKG  EKG Interpretation  Date/Time:  Friday August 16 2016 22:28:50 EDT Ventricular Rate:  135 PR Interval:    QRS Duration: 84 QT Interval:  289 QTC Calculation: 434 R Axis:   3 Text Interpretation:  Atrial fibrillation Repolarization abnormality, prob rate related When compared with ECG of 08/15/2016, HEART RATE has increased Confirmed by [facility]  MD, Yovanni Frenette ([id number]) on 08/17/2016 12:06:56 AM       Procedures Procedures (including critical care time) CRITICAL CARE Performed by: EPPIR,JJOAC Total critical care time: 40 minutes Critical care time was exclusive of separately billable procedures and treating other patients. Critical care was necessary to treat or prevent imminent or life-threatening deterioration. Critical care was time spent personally by me on the following activities: development of treatment plan with patient and/or surrogate as well as nursing, discussions with consultants, evaluation of patient's response to treatment, examination of patient, obtaining history from patient or  surrogate, ordering and performing treatments and interventions, ordering and review of laboratory studies, ordering and review of radiographic studies, pulse oximetry and re-evaluation of patient's condition.  Medications Ordered in ED Medications - No data to display  Initial Impression / Assessment and Plan / ED Course  I have reviewed the triage vital signs and the nursing notes.  Age of fibrillation with rapid ventricular response. Old records are reviewed, and he was actually in the ED yesterday with similar presentation. He had responded well to IV metoprolol. He had contacted his cardiologist who recommended that he increase his amiodarone  dose to 200 mg a day for 1 week. He presented again with atrial fibrillation with rapid ventricular response. He was given 2 doses of metoprolol with good response of heart rate. Until he can be successfully converted to sinus rhythm, I feel he needs to be on ongoing beta-blockade. He is discharged with instructions to take metoprolol on a regular basis until he gets other instructions from his cardiologist. He is to follow-up with his cardiologist in the next several days.  CHA2DS2/VAS Stroke Risk Points      4 >= 2 Points: High Risk  1 - 1.99 Points: Medium Risk  0 Points: Low Risk    The previous score was 3 on 12/25/2015.:  Change:         Details    Note: External data might be a factor in metrics not marked with    Points Metrics   This score determines the patient's risk of having a stroke if the  patient has atrial fibrillation.       0 Has Congestive Heart Failure:  No   1 Has Vascular Disease:  Yes   1 Has Hypertension:  Yes   2 Age:  88   0 Has Diabetes:  No   0 Had Stroke:  No Had TIA:  No Had thromboembolism:  No   0 Male:  No          Final Clinical Impressions(s) / ED Diagnoses   Final diagnoses:  Atrial fibrillation with RVR ([place])   New Prescriptions New Prescriptions   No medications on file   I personally  performed the services described in this documentation, which was scribed in my presence. The recorded information has been reviewed and is accurate.       Tanner Fuel, MD 70/[id number]/95 [id number]

## 2016-08-17 NOTE — Discharge Instructions (Signed)
Start taking your metoprolol 50 mg twice a day starting today.If your heart rate is still too high, you can take an extra dose of metoprolol. Follow up with your cardiologist.

## 2016-08-18 ENCOUNTER — Telehealth: Payer: Self-pay | Admitting: Physician Assistant

## 2016-08-18 NOTE — Telephone Encounter (Signed)
Wife called because pt was having problems with afib.  Episode started several days ago. He was seen in the ER and sent home.  Has metoprolol, takes 25-50 mg bid prn. Took 50 mg bid yesterday, took 25 mg this am. HR currently controlled in the 60s-70s, but SBP 80s. He is not in danger of passing out, but feels weak.   He does not want to go back to the ER.  For now,  1. Stop amlodipine 2. Decrease losartan to 25 mg qd from 50 mg bid 3. If BP will allow, start metoprolol 25 mg bid every day.  I will send message to office to have him follow up early next week.  Broached the concept of rate control vs rhythm control.  They will discuss it and let us know.  Continue Xarelto.  Rosaria Ferries, PA-C 08/18/2016 2:04 PM Bee[place] [phone]

## 2016-08-19 ENCOUNTER — Ambulatory Visit: Payer: Medicare Other | Admitting: Physician Assistant

## 2016-08-20 ENCOUNTER — Encounter: Payer: Self-pay | Admitting: Cardiovascular Disease

## 2016-08-20 ENCOUNTER — Ambulatory Visit (INDEPENDENT_AMBULATORY_CARE_PROVIDER_SITE_OTHER): Payer: Medicare Other | Admitting: Cardiovascular Disease

## 2016-08-20 VITALS — BP 115/69 | HR 96 | Ht 68.0 in | Wt 158.0 lb

## 2016-08-20 DIAGNOSIS — I10 Essential (primary) hypertension: Secondary | ICD-10-CM | POA: Diagnosis not present

## 2016-08-20 DIAGNOSIS — N183 Chronic kidney disease, stage 3 unspecified: Secondary | ICD-10-CM

## 2016-08-20 DIAGNOSIS — E785 Hyperlipidemia, unspecified: Secondary | ICD-10-CM

## 2016-08-20 DIAGNOSIS — Z7901 Long term (current) use of anticoagulants: Secondary | ICD-10-CM

## 2016-08-20 DIAGNOSIS — I480 Paroxysmal atrial fibrillation: Secondary | ICD-10-CM | POA: Diagnosis not present

## 2016-08-20 DIAGNOSIS — I481 Persistent atrial fibrillation: Secondary | ICD-10-CM

## 2016-08-20 DIAGNOSIS — Z79899 Other long term (current) drug therapy: Secondary | ICD-10-CM | POA: Diagnosis not present

## 2016-08-20 DIAGNOSIS — E039 Hypothyroidism, unspecified: Secondary | ICD-10-CM | POA: Diagnosis not present

## 2016-08-20 DIAGNOSIS — I4819 Other persistent atrial fibrillation: Secondary | ICD-10-CM

## 2016-08-20 LAB — BASIC METABOLIC PANEL
BUN: 30 mg/dL — ABNORMAL HIGH (ref 7–25)
CALCIUM: 8.8 mg/dL (ref 8.6–10.3)
CO2: 26 mmol/L (ref 20–31)
CREATININE: 1.66 mg/dL — AB (ref 0.70–1.18)
Chloride: 105 mmol/L (ref 98–110)
Glucose, Bld: 93 mg/dL (ref 65–99)
Potassium: 4.5 mmol/L (ref 3.5–5.3)
Sodium: 140 mmol/L (ref 135–146)

## 2016-08-20 LAB — MAGNESIUM: Magnesium: 2.1 mg/dL (ref 1.5–2.5)

## 2016-08-20 MED ORDER — AMIODARONE HCL 200 MG PO TABS: 200.0000 mg | ORAL_TABLET | Freq: Two times a day (BID) | ORAL | 6 refills | Status: DC

## 2016-08-20 NOTE — Progress Notes (Signed)
Patient ID: Tanner Ho, male   DOB: 1940/11/16, 76 y.o.   MRN: [id number]    Primary M.D.: Dr. [name]  HPI: Tanner Ho is a 76 y.o. male who presents to the office today for a one week follow-up cardiology evaluation following his recent emergency room evaluations.   Tanner Ho has a history of mitral valve prolapse, hypertension, hyperlipidemia, hypothyroidism, as well as paroxysmal atrial fibrillation. He is on chronic Coumadin anticoagulation. His last documented atrial fibrillation episode was in October 2013. A nuclear perfusion study in October 2013 revealed normal perfusion imaging but he developed 1-2 mm of ST segment depression at peak stress test and the possibility of microvascular etiology leading to his ECG abnormalities. A cardiopulmonary met test on 09/17/2012 demonstarated a reduced peak maximum oxygen consumption at 58%. He had a suboptimal peak cardiovascular stress load making cardiovascular status interpretation indeterminate. He did have mild ventilation/perfusion mismatch suggesting impaired ulnar circulation plus minus increased dead space. He had a blunted chronotropic response to exercise. The test was limited by leg fatigue. When I saw him in the office subsequently I reduced his Cardizem from 240 to180 mg. At times, he notes a rare palpitation. He denies any awareness of breakthrough atrial fibrillation.   He presented to the emergency room in November 2014 with some vague chest pain. In retrospect, he feels this may have been GERD symptoms. Cardiac enzymes were negative. ECG was without changes. A nuclear perfusion study on 04/22/2013 was low risk and showed mild diaphragmatic attenuation but did not show a region of scar or ischemia, unchanged from his prior study of several years previously.  In 2015 he had noticed some dizziness and decreased energy   A cardiac monitor revealed episodes of PACs, bradycardia, with heart rates down to 48, but he also had a 6 beat  run of wide-complex tachycardia at a rate of 131.  Remotely, he had developed breakthrough atrial fibrillation on amiodarone 100 mg and he has been on 100 mg, alternating with 200 mg daily.  On this most recent monitor, there were no episodes of recurrent AF.   He has been on amiodarone 100 mg daily, Norvasc 2.5 mg, losartan 50 mg bid, HCTZ 12.5 mg daily, Toprol-XL 12.5 mg daily, Crestor 5 mg and Coumadin.  He also has noticed rare episodes of vertigo for which he takes meclizine.    He also has been on Uroxatral to improve urinary flow. When I last saw him we had a lengthy discussion concerning warfarin versus new or anticoagulants.  He opted to switch to Xarelto and has been maintained on 15 mg daily based on his creatinine clearance.  He has tolerated this well without bleeding.  He was evaluated by Samara Snide the office on 12/27/2015 and had noticed an occasional irregularity of his heartbeat.  When she evaluated him, he was still in sinus rhythm without ectopy and there were no acute changes.  Presently continues to feel that his pulse has been stable.  He denies chest pain.  He denies presyncope or syncope.  When I saw him in follow-up, he was in sinus rhythm.  When I saw Tanner Ho 1 week ago for a follow-up cardiology evaluation, he was very stable.  He was denying any episodes of palpitations.  He is not required use of when necessary metoprolol.  His ECG revealed sinus bradycardia at 58 bpm.  He was unaware of any any heart rate irregularity. Dr. Shelia Media had checked his laboratory.  He denied any  episodes of chest pain, presyncope or syncope.   Since I last saw him, he developed recurrent atrial fibrillation the following evening with a rapid ventricular response and presented to [facility] ER on March 15.  He was given metoprolol 5 mg intravenously which brought his heart rate down.  He went home after several hours.  The following morning he was advised to increase amiodarone from 100 mg to 200 mg daily.   That night, again he develop recurrent AF with rapid ventricular response and repeat presented to the emergency room on 08/16/2016.  At this time.  He was given metoprolol IV 2.5 mg 2.  He also was advised to take metoprolol 50 mg twice a day.  On this increased beta blocker dose, his blood pressure became low and he called and spoke with Rosaria Ferries several days later.  His medications were adjusted and he was told to stop amlodipine, reduce losartan to 25 mg, and reduce metoprolol to 25 mg twice a day.  His blood pressure has been stable on this reduced regimen.  He presents to the office today for follow-up evaluation.   Past Medical History:  Diagnosis Date  . Adrenal mass ([place])   . Asthma   . BPH (benign prostatic hyperplasia)   . CKD (chronic kidney disease), stage III   . GERD (gastroesophageal reflux disease)   . H/O cardiovascular stress test 03/2012   EKG with 1-2 mm ST segment depression but normal perfusion images. Followup metastases test reduced exercise effort and functional status and determinate for myocardial dysfunction  . History of cardiac monitoring    a. 2015: cardiac monitor revealed episodes of PACs, bradycardia, with heart rates down to 48, 4 beat PSVT, 6 beat run of NSVT at a rate of 131. There was no recurrent afib.   Marland Kitchen Hyperlipidemia LDL goal < 100   . Hypertension   . Hypothyroidism   . Macular degeneration   . MVP (mitral valve prolapse)    on cardiac cath - not mentioned on 2014 echo.  Marland Kitchen NSVT (nonsustained ventricular tachycardia) ([place])   . PAF (paroxysmal atrial fibrillation) ([place])    last episode 03/2012  . Pericarditis   . Premature atrial contractions   . Sinus bradycardia     Past Surgical History:  Procedure Laterality Date  . ABDOMINAL VASCULAR ULTRASOUND EVALUATION  12/20/2008   mild non-obstructive plaque, abdominal aorta, with mild calcification at the bifurcation-no obstruction, no evidence for aneurysm of the abdominal aorta  . CARDIAC  CATHETERIZATION  01/04/1999   hyperdynamic LV function, probable angiographic mitral valve prolapse, systolic muscle bridging of the mid LAD without obstructive disease, systolic narrowing up to 41%  . Cardiopulmonary MET-test  07/20/2012   reduced exercise effort and functional status, peak max O2 comsumption was only 58% of predicted, cardiovascular status was interpreted as indeterminate for myocardial dysfunction due to the suboptimal peak cardiovascular stress load.    Allergies  Allergen Reactions  . Ciprofloxacin Other (See Comments)    Cannot take due to currently taking Amiodarone  . Hytrin [Terazosin] Other (See Comments)    Syncopal event     Current Outpatient Prescriptions  Medication Sig Dispense Refill  . acetaminophen (TYLENOL 8 HOUR ARTHRITIS PAIN) 650 MG CR tablet Take 650 mg by mouth every 8 (eight) hours as needed for pain.    Marland Kitchen acetaminophen (TYLENOL) 500 MG tablet Take 500 mg by mouth every 6 (six) hours as needed for mild pain.    Marland Kitchen alfuzosin (UROXATRAL) 10 MG 24  hr tablet Take 10 mg by mouth daily.    Marland Kitchen amiodarone (PACERONE) 200 MG tablet Take 1 tablet (200 mg total) by mouth 2 (two) times daily. 60 tablet 6  . Cholecalciferol (VITAMIN D-3) 1000 UNITS CAPS Take 2 capsules by mouth daily.     Marland Kitchen docusate sodium (COLACE) 100 MG capsule Take 100 mg by mouth daily.    . finasteride (PROSCAR) 5 MG tablet Take 5 mg by mouth daily.    . fish oil-omega-3 fatty acids 1000 MG capsule Take 1 g by mouth 2 (two) times daily.    . hydrochlorothiazide (HYDRODIURIL) 25 MG tablet Take 12.5 mg by mouth daily.     Marland Kitchen levothyroxine (SYNTHROID, LEVOTHROID) 50 MCG tablet Take 1 tablet by mouth daily.  2  . loratadine (CLARITIN) 10 MG tablet Take 10 mg by mouth daily as needed for allergies.    Marland Kitchen losartan (COZAAR) 25 MG tablet Take 25 mg by mouth daily.    . magnesium hydroxide (MILK OF MAGNESIA) 800 MG/5ML suspension Take 5 mLs by mouth daily as needed for constipation.    . meclizine  (ANTIVERT) 25 MG tablet Take 25 mg by mouth 3 (three) times daily as needed for dizziness.   2  . metoprolol tartrate (LOPRESSOR) 50 MG tablet TALE 1/2 TO 1 TABLETS AS NEEDED FOR PALPITATIONS 90 tablet 3  . Multiple Vitamins-Minerals (PRESERVISION AREDS) CAPS Take 1 capsule by mouth 2 (two) times daily. VITAMIN A FREE    . Polyethyl Glycol-Propyl Glycol (SYSTANE) 0.4-0.3 % SOLN Apply 2 drops to eye daily as needed (for dry eeys).    . polyethylene glycol (MIRALAX / GLYCOLAX) packet Take 17 g by mouth daily as needed for mild constipation.     . ranitidine (ZANTAC) 150 MG tablet Take 150 mg by mouth daily as needed for heartburn.    . Rivaroxaban (XARELTO) 15 MG TABS tablet Take 1 tablet (15 mg total) by mouth daily with supper. 30 tablet 11  . rosuvastatin (CRESTOR) 5 MG tablet TAKE 1 TABLET (5 MG TOTAL) BY MOUTH DAILY AT 6 PM. 30 tablet 6   No current facility-administered medications for this visit.     Social History   Social History  . Marital status: Married    Spouse name: N/A  . Number of children: N/A  . Years of education: N/A   Occupational History  . Not on file.   Social History Main Topics  . Smoking status: Never Smoker  . Smokeless tobacco: Never Used  . Alcohol use No  . Drug use: No  . Sexual activity: Not on file   Other Topics Concern  . Not on file   Social History Narrative  . No narrative on file    Social history is notable in that he is married. 2 children 4 grandchildren. No tobacco alcohol use.  ROS General: Negative; No fevers, chills, or night sweats;  HEENT: Negative; No changes in vision or hearing, sinus congestion, difficulty swallowing Pulmonary: Negative; No cough, wheezing, shortness of breath, hemoptysis Cardiovascular: Negative; No chest pain, presyncope, syncope, palpitations Edema has improved GI: Positive for GERD; No nausea, vomiting, diarrhea, or abdominal pain GU: Negative; No dysuria, hematuria, or difficulty  voiding Musculoskeletal: Negative; no myalgias, joint pain, or weakness Hematologic/Oncology: Negative; no easy bruising, bleeding Endocrine: Negative; no heat/cold intolerance; no diabetes Neuro: Negative; no changes in balance, headaches Skin: Negative; No rashes or skin lesions Psychiatric: Negative; No behavioral problems, depression Sleep: Negative; No snoring, daytime sleepiness, hypersomnolence, bruxism, restless  legs, hypnogognic hallucinations, no cataplexy Other comprehensive 14 point system review is negative.   PE BP 115/69   Pulse 96   Ht 5' 8" (1.727 m)   Wt 158 lb (71.7 kg)   BMI 24.02 kg/m    Repeat blood pressure by me was 120/64.  Wt Readings from Last 3 Encounters:  08/20/16 158 lb (71.7 kg)  08/16/16 159 lb (72.1 kg)  08/15/16 159 lb (72.1 kg)   General: Alert, oriented, no distress.  Skin: normal turgor, no rashes HEENT: Normocephalic, atraumatic. Pupils round and reactive; sclera anicteric;no lid lag.  Nose without nasal septal hypertrophy Mouth/Parynx benign; Mallinpatti scale 3 Neck: No JVD, no carotid bruits with normal carotid upstroke No chest wall tenderness to palpation Lungs: clear to ausculatation and percussion; no wheezing or rales Heart: Irregularly irregular rhythm with a ventricular rate in the 90s., s1 s2 normal, with a 1/6 systolic murmur.  No diastolic murmur.  No rubs, thrills or heaves Abdomen: soft, nontender; no hepatosplenomehaly, BS+; abdominal aorta nontender and not dilated by palpation. Back: No CVA tenderness Pulses 2+ Extremities: Trivial ankle edema, left greater than right;, no clubbing cyanosis, Homan's sign negative  Neurologic: grossly nonfocal Psychological: Normal cognitive function, normal affect and mood  ECG (independently read by me): Atrial fibrillation with ventricular rate at 96 bpm.  Nonspecific ST-T changes.  Lateral T-wave changes.  08/13/2016 ECG (independently read by me): Sinus bradycardia 58 bpm.   Nonspecific ST changes.  Normal intervals.  September 2017 ECG (independently read by me): Sinus rhythm at 68.  Normal intervals.  No significant ST-T changes.  December 2016 ECG (independently read by me): Normal sinus rhythm at 61 bpm.  No ectopy.  QTc interval 434 ms.  June 2016 ECG (independently read by me): Sinus bradycardia 52 bpm.  QTc interval 407 ms.  PR interval 158 msec  December 2015 ECG (independently read by me): Sinus bradycardia at 49 bpm.  Nonspecific ST changes.  QTc interval 424 ms.  ECG (independently read by me): Sinus bradycardia 50 beats per minute.  Nonspecific ST changes.    Prior ECG: Sinus bradycardia at 50 beats per minute. No ectopy on ECG. Intervals normal  LABS: BMP Latest Ref Rng & Units 08/15/2016 01/08/2016 12/27/2015  Glucose 65 - 99 mg/dL 99 94 95  BUN 6 - 20 mg/dL 29(H) 24 24  Creatinine 0.61 - 1.24 mg/dL 1.48(H) 1.49(H) 1.59(H)  Sodium 135 - 145 mmol/L 138 141 138  Potassium 3.5 - 5.1 mmol/L 3.4(L) 4.6 4.2  Chloride 101 - 111 mmol/L 106 105 104  CO2 22 - 32 mmol/L _0 Calcium 8.9 - 10.3 mg/dL 8.2(L) 8.8 8.7   Hepatic Function Latest Ref Rng & Units 12/28/2014 01/05/2014  Total Protein 6.1 - 8.1 g/dL 6.2 6.7  Albumin 3.6 - 5.1 g/dL 3.7 4.4  AST 10 - 35 U/L 17 15  ALT 9 - 46 U/L 15 15  Alk Phosphatase 40 - 115 U/L 117(H) 107  Total Bilirubin 0.2 - 1.2 mg/dL 0.6 0.4   CBC Latest Ref Rng & Units 08/15/2016 12/27/2015 12/28/2014  WBC 4.0 - 10.5 K/uL 5.6 5.0 4.7  Hemoglobin 13.0 - 17.0 g/dL 11.9(L) 13.2 13.3  Hematocrit 39.0 - 52.0 % 34.9(L) 38.6 39.5  Platelets 150 - 400 K/uL 156 158 173   Lab Results  Component Value Date   MCV 100.6 (H) 08/15/2016   MCV 100.5 (H) 12/27/2015   MCV 100.0 12/28/2014   Lab Results  Component Value Date  TSH 2.52 12/27/2015   Lipid Panel     Component Value Date/Time   CHOL 120 (L) 12/28/2014 0924   TRIG 85 12/28/2014 0924   HDL 49 12/28/2014 0924   CHOLHDL 2.4 12/28/2014 0924   VLDL 17 12/28/2014  0924   LDLCALC 54 12/28/2014 0924     RADIOLOGY: No results found.  IMPRESSION:  1. Persistent atrial fibrillation ([place])   2. Essential hypertension   3. Hypothyroidism, unspecified type   4. Dyslipidemia   5. Medication management   6. Chronic anticoagulation   7. CKD (chronic kidney disease), stage III     ASSESSMENT AND PLAN: Ms. Gayler is a 76 year old gentleman who has a history of hypertension, hyperlipidemia, hypothyroidism, mitral valve prolapse, as well as paroxysmal atrial fibrillation. He had been maintaining sinus rhythmand was without recurrent atrial fibrillation since July 2017.  Since I saw him one week ago, he developed recurrent atrial fibrillation with rapid ventricular response and was evaluated on 2 consecutive evenings in the emergency room where he was treated with IV metoprolol for rate slowing.  He was advised to take metoprolol at 50 mg's twice a day and with his additional medications.  He became hypotensive with blood pressures in the 80s and subsequently has had  reduction of his prior medications with discontinuance of amlodipine, and reduction of losartan to 25 mg and metoprolol, tartrate to 25 mg twice a day.  His blood pressure today is stable and on repeat by me was 110/64.  ECG shows that he has persistent atrial fibrillation with ventricular rates in the mid to upper 90s.  I have recommended further titration of amiodarone to 2 mg twice a day.  I will recheck a BMET, magnesium level and TSH.  He continues to take levothyroxine at 50 g.  He is on Crestor 5 mg for hyperlipidemia.  He is on Xarelto at 15 mg for his systemic anticoagulation.  He has a history of stage III chronic kidney disease; his most recent creatinine was 1.49.  Potassium was low at 3.4 when evaluated in the emergency room.  He will undergo a 2-D echo Doppler study for reevaluation of his systolic and diastolic function, valvular architecture, and atrial dimensions.  I will see him in follow-up  in 3 weeks for reevaluation.  If at that time he is still in atrial fibrillation we will initiate plans for DC cardioversion.  Time spent: 25 minutes Troy Sine, MD, [facility]  08/20/2016 11:26 AM

## 2016-08-20 NOTE — Telephone Encounter (Signed)
Pt worked in today with me

## 2016-08-20 NOTE — Patient Instructions (Signed)
Medication Instructions:   The amiodarone has been increased to 200 mg twice a day.  Labwork:  B-MET, TSH, MAGNESIUM TODAY.  Testing/Procedures:  CURRENTLY SCHEDULED ECHO will be moved prior to next visit.  Follow-Up:  3 weeks.  Any Other Special Instructions Will Be Listed Below (If Applicable).

## 2016-08-21 ENCOUNTER — Telehealth: Payer: Self-pay | Admitting: Cardiovascular Disease

## 2016-08-21 LAB — TSH: TSH: 3.38 mIU/L (ref 0.40–4.50)

## 2016-08-21 NOTE — Telephone Encounter (Signed)
Tanner Ho is calling states its complicated ( about his medication) . Please call

## 2016-08-21 NOTE — Telephone Encounter (Signed)
Spoke with Dr. Claiborne Billings He advises that patient decrease metoprolol tartrate to 12.5mg  twice daily and HOLD for SBP under 100  BP 102/64 - HR 78 per daughter   Advised to continue monitoring BP & HR Med list updated.

## 2016-08-21 NOTE — Telephone Encounter (Signed)
Returned call to patient's wife Patient saw Dr. Claiborne Billings yesterday Patient is in AF and has been for 1 week MD increased amiodarone to 400mg  QD 3/20 - other meds changed Patient is to take metoprolol 25mg  BID BP this AM before meds was 113/58 and HR 78 - did NOT take metoprolol  After medications, now the BP cuff will not pick up his BP reading - she states this happens when his BP low  Wife reports patient feels "awful" - weak, pale His "heart is crazy" She has tried to check a manual pulse and HR is around 80bpm  Patient's daughter is a Marine scientist and will check BP after work  Will defer to MD to see if BP parameters for metoprolol administration need to be given

## 2016-08-22 ENCOUNTER — Other Ambulatory Visit: Payer: Self-pay | Admitting: Cardiovascular Disease

## 2016-08-22 NOTE — Telephone Encounter (Signed)
Scr = 1.66 on 08/20/2016  H/H stable; age 76, wt 71.7kg  Est CrCl =  39ml/min

## 2016-08-26 NOTE — Telephone Encounter (Signed)
aciknowledged

## 2016-09-04 ENCOUNTER — Ambulatory Visit (HOSPITAL_COMMUNITY): Payer: Medicare Other | Attending: Internal Medicine

## 2016-09-04 ENCOUNTER — Other Ambulatory Visit: Payer: Self-pay

## 2016-09-04 ENCOUNTER — Telehealth: Payer: Self-pay | Admitting: Emergency Medicine

## 2016-09-04 DIAGNOSIS — N189 Chronic kidney disease, unspecified: Secondary | ICD-10-CM | POA: Insufficient documentation

## 2016-09-04 DIAGNOSIS — I341 Nonrheumatic mitral (valve) prolapse: Secondary | ICD-10-CM | POA: Diagnosis not present

## 2016-09-04 DIAGNOSIS — I340 Nonrheumatic mitral (valve) insufficiency: Secondary | ICD-10-CM | POA: Diagnosis not present

## 2016-09-04 DIAGNOSIS — I129 Hypertensive chronic kidney disease with stage 1 through stage 4 chronic kidney disease, or unspecified chronic kidney disease: Secondary | ICD-10-CM | POA: Diagnosis not present

## 2016-09-04 DIAGNOSIS — I272 Pulmonary hypertension, unspecified: Secondary | ICD-10-CM | POA: Insufficient documentation

## 2016-09-04 DIAGNOSIS — R011 Cardiac murmur, unspecified: Secondary | ICD-10-CM | POA: Insufficient documentation

## 2016-09-04 DIAGNOSIS — I351 Nonrheumatic aortic (valve) insufficiency: Secondary | ICD-10-CM | POA: Insufficient documentation

## 2016-09-04 DIAGNOSIS — I77810 Thoracic aortic ectasia: Secondary | ICD-10-CM | POA: Insufficient documentation

## 2016-09-04 DIAGNOSIS — E785 Hyperlipidemia, unspecified: Secondary | ICD-10-CM | POA: Diagnosis not present

## 2016-09-04 NOTE — Telephone Encounter (Signed)
Call regarding what BP medications tonight because of slow heart rate. Heart rate 49 and BP 146/89.   Recommend he hold off on Metoprolol tonight, take Losartan and Amlodipine as scheduled and then take Metoprolol 12.5mg  in AM. Check BP in AM if abnormal call office again. He currently is asymptomatic and feeling well, discussed ER precautions- hypotension, bradycardia, weak, dizzy, CP, SOB.

## 2016-09-06 ENCOUNTER — Telehealth: Payer: Self-pay | Admitting: Cardiovascular Disease

## 2016-09-06 NOTE — Telephone Encounter (Signed)
agree

## 2016-09-06 NOTE — Telephone Encounter (Signed)
New Message  Pt c/o medication issue:  1. Name of Medication: Metoprolol and Amiodarone  2. How are you currently taking this medication (dosage and times per day)? 25mg .... 200mg    3. Are you having a reaction (difficulty breathing--STAT)? No  4. What is your medication issue? Pt wife would ike to discuss these two medications with RN. Please call back to discuss

## 2016-09-06 NOTE — Telephone Encounter (Signed)
Returned call to patient He states he is back in sinus rhythm per BP cuff  Patient does not complain of fatigue, dizziness, lightheadedness, feeling like he will pass out  Patient has not had as much energy since he has been in AF He states his pulse rate has gone down to 40s (HR 48 this AM) - since Wednesday  He took metoprolol tartrate 12.5mg  yesterday around 9am and has not taken it since BP this AM 141/54 Patient started taking losartan 25mg  BID on 4/4 and has been taking BID since They called in and spoke with T. Carlota Raspberry, PA on 4/4  SBP trends on 4/6 - 140s, 110s, 120s, 140s  Advised patient continue taking losartan 25mg  BID and if HR remains in 40s, hold metoprolol (he has not taken any since 4/5 AM - normally takes BID). Advised to keep log of BP & HR and bring to 4/9 appt w/Dr. Claiborne Billings for further med instructions. Advised if questions after hours/over weekend, to contact on call cardiology staff. Wife/patient agreed w/plan & voiced understanding.   Patient has OV with Dr. Claiborne Billings on 4/9

## 2016-09-09 ENCOUNTER — Ambulatory Visit (INDEPENDENT_AMBULATORY_CARE_PROVIDER_SITE_OTHER): Payer: Medicare Other | Admitting: Cardiovascular Disease

## 2016-09-09 ENCOUNTER — Encounter: Payer: Self-pay | Admitting: Cardiovascular Disease

## 2016-09-09 VITALS — BP 140/50 | HR 68 | Ht 68.0 in | Wt 160.0 lb

## 2016-09-09 DIAGNOSIS — E039 Hypothyroidism, unspecified: Secondary | ICD-10-CM | POA: Diagnosis not present

## 2016-09-09 DIAGNOSIS — I480 Paroxysmal atrial fibrillation: Secondary | ICD-10-CM | POA: Diagnosis not present

## 2016-09-09 DIAGNOSIS — N183 Chronic kidney disease, stage 3 unspecified: Secondary | ICD-10-CM

## 2016-09-09 DIAGNOSIS — Z7901 Long term (current) use of anticoagulants: Secondary | ICD-10-CM

## 2016-09-09 DIAGNOSIS — I341 Nonrheumatic mitral (valve) prolapse: Secondary | ICD-10-CM | POA: Diagnosis not present

## 2016-09-09 DIAGNOSIS — I10 Essential (primary) hypertension: Secondary | ICD-10-CM

## 2016-09-09 MED ORDER — METOPROLOL TARTRATE 25 MG PO TABS: ORAL_TABLET | ORAL | 3 refills | Status: DC

## 2016-09-09 NOTE — Progress Notes (Signed)
Patient ID: Tanner Ho, male   DOB: February 16, 1941, 76 y.o.   MRN: [id number]    Primary M.D.: Dr. [name]  HPI: Tanner Ho is a 76 y.o. male who presents to the office today for a 3 week follow-up cardiology evaluation following development of atrial fibrillation.  Tanner Ho has a history of mitral valve prolapse, hypertension, hyperlipidemia, hypothyroidism, as well as paroxysmal atrial fibrillation. He is on chronic Coumadin anticoagulation. His last documented atrial fibrillation episode was in October 2013. A nuclear perfusion study in October 2013 revealed normal perfusion imaging but he developed 1-2 mm of ST segment depression at peak stress test and the possibility of microvascular etiology leading to his ECG abnormalities. A cardiopulmonary met test on 09/17/2012 demonstarated a reduced peak maximum oxygen consumption at 58%. He had a suboptimal peak cardiovascular stress load making cardiovascular status interpretation indeterminate. He did have mild ventilation/perfusion mismatch suggesting impaired ulnar circulation plus minus increased dead space. He had a blunted chronotropic response to exercise. The test was limited by leg fatigue. When I saw him in the office subsequently I reduced his Cardizem from 240 to180 mg. At times, he notes a rare palpitation. He denies any awareness of breakthrough atrial fibrillation.   He presented to the emergency room in November 2014 with some vague chest pain. In retrospect, he feels this may have been GERD symptoms. Cardiac enzymes were negative. ECG was without changes. A nuclear perfusion study on 04/22/2013 was low risk and showed mild diaphragmatic attenuation but did not show a region of scar or ischemia, unchanged from his prior study of several years previously.  In 2015 he had noticed some dizziness and decreased energy   A cardiac monitor revealed episodes of PACs, bradycardia, with heart rates down to 48, but he also had a 6 beat run of  wide-complex tachycardia at a rate of 131.  Remotely, he had developed breakthrough atrial fibrillation on amiodarone 100 mg and he has been on 100 mg, alternating with 200 mg daily.  On this most recent monitor, there were no episodes of recurrent AF.   He has been on amiodarone 100 mg daily, Norvasc 2.5 mg, losartan 50 mg bid, HCTZ 12.5 mg daily, Toprol-XL 12.5 mg daily, Crestor 5 mg and Coumadin.  He also has noticed rare episodes of vertigo for which he takes meclizine.    He also has been on Uroxatral to improve urinary flow. When I last saw him we had a lengthy discussion concerning warfarin versus new or anticoagulants.  He opted to switch to Xarelto and has been maintained on 15 mg daily based on his creatinine clearance.  He has tolerated this well without bleeding.  He was evaluated by Samara Snide the office on 12/27/2015 and had noticed an occasional irregularity of his heartbeat.  When she evaluated him, he was still in sinus rhythm without ectopy and there were no acute changes.  Presently continues to feel that his pulse has been stable.  He denies chest pain.  He denies presyncope or syncope.  When I saw him in follow-up, he was in sinus rhythm.  When I saw Tanner Ho 1 week ago for a follow-up cardiology evaluation, he was very stable.  He was denying any episodes of palpitations.  He is not required use of when necessary metoprolol.  His ECG revealed sinus bradycardia at 58 bpm.  He was unaware of any any heart rate irregularity. Dr. Shelia Media had checked his laboratory.  He denied any episodes of  chest pain, presyncope or syncope.   He developed recurrent atrial fibrillation with a rapid ventricular response and presented to [facility] ER on March 15.  He was given metoprolol 5 mg intravenously which brought his heart rate down.  He went home after several hours.  The following morning he was advised to increase amiodarone from 100 mg to 200 mg daily.  That night, again he develop recurrent AF with  rapid ventricular response and repeat presented to the emergency room on 08/16/2016.  At this time.  He was given metoprolol IV 2.5 mg 2.  He also was advised to take metoprolol 50 mg twice a day.  On this increased beta blocker dose, his blood pressure became low and he called and spoke with Rosaria Ferries several days later.  His medications were adjusted and he was told to stop amlodipine, reduce losartan to 25 mg, and reduce metoprolol to 25 mg twice a day.  When I last saw him 3 weeks ago, he was in atrial fibrillation with ventricular rate in the 90s.  I recommended further titration of amiodarone and increase this to 200 mg twice a day.  Also is on levothyroxine at 50 g.  TSH was normal at 3.38.  He has stage III chronic kidney disease.  He underwent a 2-D echo Doppler study on 09/04/2016 and was still in atrial fibrillation at that time.  Ejection fraction was 55-60%.  There was mild late systolic prolapse of both leaflets with mild MR.  There was mild PA pressure elevation at 39 mm.  Aortic root dimension measured 39 mm.  On the following evening, he began to notice his heart rate reducing down into the upper 40s to 50s and his rhythm appeared more regular. He called the office and was advised to reduce his metoprolol dose.  He also a completely discontinue this altogether.  He now notes his pulse typically in the 60s.  His blood pressure has risen to the 140s. He presents for reevaluation.  Past Medical History:  Diagnosis Date  . Adrenal mass ([place])   . Asthma   . BPH (benign prostatic hyperplasia)   . CKD (chronic kidney disease), stage III   . GERD (gastroesophageal reflux disease)   . H/O cardiovascular stress test 03/2012   EKG with 1-2 mm ST segment depression but normal perfusion images. Followup metastases test reduced exercise effort and functional status and determinate for myocardial dysfunction  . History of cardiac monitoring    a. 2015: cardiac monitor revealed episodes of PACs,  bradycardia, with heart rates down to 48, 4 beat PSVT, 6 beat run of NSVT at a rate of 131. There was no recurrent afib.   Marland Kitchen Hyperlipidemia LDL goal < 100   . Hypertension   . Hypothyroidism   . Macular degeneration   . MVP (mitral valve prolapse)    on cardiac cath - not mentioned on 2014 echo.  Marland Kitchen NSVT (nonsustained ventricular tachycardia) ([place])   . PAF (paroxysmal atrial fibrillation) ([place])    last episode 03/2012  . Pericarditis   . Premature atrial contractions   . Sinus bradycardia     Past Surgical History:  Procedure Laterality Date  . ABDOMINAL VASCULAR ULTRASOUND EVALUATION  12/20/2008   mild non-obstructive plaque, abdominal aorta, with mild calcification at the bifurcation-no obstruction, no evidence for aneurysm of the abdominal aorta  . CARDIAC CATHETERIZATION  01/04/1999   hyperdynamic LV function, probable angiographic mitral valve prolapse, systolic muscle bridging of the mid LAD without obstructive disease,  systolic narrowing up to 41%  . Cardiopulmonary MET-test  07/20/2012   reduced exercise effort and functional status, peak max O2 comsumption was only 58% of predicted, cardiovascular status was interpreted as indeterminate for myocardial dysfunction due to the suboptimal peak cardiovascular stress load.    Allergies  Allergen Reactions  . Ciprofloxacin Other (See Comments)    Cannot take due to currently taking Amiodarone  . Hytrin [Terazosin] Other (See Comments)    Syncopal event     Current Outpatient Prescriptions  Medication Sig Dispense Refill  . acetaminophen (TYLENOL 8 HOUR ARTHRITIS PAIN) 650 MG CR tablet Take 650 mg by mouth every 8 (eight) hours as needed for pain.    Marland Kitchen acetaminophen (TYLENOL) 500 MG tablet Take 500 mg by mouth every 6 (six) hours as needed for mild pain.    Marland Kitchen alfuzosin (UROXATRAL) 10 MG 24 hr tablet Take 10 mg by mouth daily.    Marland Kitchen amiodarone (PACERONE) 200 MG tablet Take 1 tablet (200 mg total) by mouth 2 (two) times daily. 60  tablet 6  . Cholecalciferol (VITAMIN D-3) 1000 UNITS CAPS Take 2 capsules by mouth daily.     Marland Kitchen docusate sodium (COLACE) 100 MG capsule Take 100 mg by mouth daily.    . finasteride (PROSCAR) 5 MG tablet Take 5 mg by mouth daily.    . fish oil-omega-3 fatty acids 1000 MG capsule Take 1 g by mouth 2 (two) times daily.    . hydrochlorothiazide (HYDRODIURIL) 25 MG tablet Take 12.5 mg by mouth daily.     Marland Kitchen levothyroxine (SYNTHROID, LEVOTHROID) 50 MCG tablet Take 1 tablet by mouth daily.  2  . loratadine (CLARITIN) 10 MG tablet Take 10 mg by mouth daily as needed for allergies.    Marland Kitchen losartan (COZAAR) 25 MG tablet Take 25 mg by mouth 2 (two) times daily.     . magnesium hydroxide (MILK OF MAGNESIA) 800 MG/5ML suspension Take 5 mLs by mouth daily as needed for constipation.    . meclizine (ANTIVERT) 25 MG tablet Take 25 mg by mouth 3 (three) times daily as needed for dizziness.   2  . Multiple Vitamins-Minerals (PRESERVISION AREDS) CAPS Take 1 capsule by mouth 2 (two) times daily. VITAMIN A FREE    . Polyethyl Glycol-Propyl Glycol (SYSTANE) 0.4-0.3 % SOLN Apply 2 drops to eye daily as needed (for dry eeys).    . polyethylene glycol (MIRALAX / GLYCOLAX) packet Take 17 g by mouth daily as needed for mild constipation.     . ranitidine (ZANTAC) 150 MG tablet Take 150 mg by mouth daily as needed for heartburn.    . rosuvastatin (CRESTOR) 5 MG tablet TAKE 1 TABLET (5 MG TOTAL) BY MOUTH DAILY AT 6 PM. 30 tablet 6  . XARELTO 15 MG TABS tablet TAKE 1 TABLET (15 MG TOTAL) BY MOUTH DAILY WITH SUPPER. 30 tablet 7  . metoprolol tartrate (LOPRESSOR) 25 MG tablet Take 1 tablet as needed for palpitations 30 tablet 3   No current facility-administered medications for this visit.     Social History   Social History  . Marital status: Married    Spouse name: N/A  . Number of children: N/A  . Years of education: N/A   Occupational History  . Not on file.   Social History Main Topics  . Smoking status: Never  Smoker  . Smokeless tobacco: Never Used  . Alcohol use No  . Drug use: No  . Sexual activity: Not on file   Other  Topics Concern  . Not on file   Social History Narrative  . No narrative on file    Social history is notable in that he is married. 2 children 4 grandchildren. No tobacco alcohol use.  ROS General: Negative; No fevers, chills, or night sweats;  HEENT: Negative; No changes in vision or hearing, sinus congestion, difficulty swallowing Pulmonary: Negative; No cough, wheezing, shortness of breath, hemoptysis Cardiovascular: Negative; No chest pain, presyncope, syncope, palpitations Edema has improved GI: Positive for GERD; No nausea, vomiting, diarrhea, or abdominal pain GU: Negative; No dysuria, hematuria, or difficulty voiding Musculoskeletal: Negative; no myalgias, joint pain, or weakness Hematologic/Oncology: Negative; no easy bruising, bleeding Endocrine: Negative; no heat/cold intolerance; no diabetes Neuro: Negative; no changes in balance, headaches Skin: Negative; No rashes or skin lesions Psychiatric: Negative; No behavioral problems, depression Sleep: Negative; No snoring, daytime sleepiness, hypersomnolence, bruxism, restless legs, hypnogognic hallucinations, no cataplexy Other comprehensive 14 point system review is negative.   PE BP (!) 140/50   Pulse 68   Ht _0  (1.727 m)   Wt 160 lb (72.6 kg)   BMI 24.33 kg/m    Repeat blood pressure by me was 120/64.  Wt Readings from Last 3 Encounters:  09/09/16 160 lb (72.6 kg)  08/20/16 158 lb (71.7 kg)  08/16/16 159 lb (72.1 kg)   General: Alert, oriented, no distress.  Skin: normal turgor, no rashes HEENT: Normocephalic, atraumatic. Pupils round and reactive; sclera anicteric;no lid lag.  Nose without nasal septal hypertrophy Mouth/Parynx benign; Mallinpatti scale 3 Neck: No JVD, no carotid bruits with normal carotid upstroke No chest wall tenderness to palpation Lungs: clear to ausculatation and  percussion; no wheezing or rales Heart: Irregularly irregular rhythm with a ventricular rate in the 90s., s1 s2 normal, with a 1/6 systolic murmur.  No diastolic murmur.  No rubs, thrills or heaves Abdomen: soft, nontender; no hepatosplenomehaly, BS+; abdominal aorta nontender and not dilated by palpation. Back: No CVA tenderness Pulses 2+ Extremities: Trivial ankle edema, left greater than right;, no clubbing cyanosis, Homan's sign negative  Neurologic: grossly nonfocal Psychological: Normal cognitive function, normal affect and mood  ECG (independently read by me): Normal sinus rhythm at 69 bpm.  PACs, nonspecific ST changes.  Normal intervals.  08/20/2016 ECG (independently read by me): Atrial fibrillation with ventricular rate at 96 bpm.  Nonspecific ST-T changes.  Lateral T-wave changes.  08/13/2016 ECG (independently read by me): Sinus bradycardia 58 bpm.  Nonspecific ST changes.  Normal intervals.  September 2017 ECG (independently read by me): Sinus rhythm at 68.  Normal intervals.  No significant ST-T changes.  December 2016 ECG (independently read by me): Normal sinus rhythm at 61 bpm.  No ectopy.  QTc interval 434 ms.  June 2016 ECG (independently read by me): Sinus bradycardia 52 bpm.  QTc interval 407 ms.  PR interval 158 msec  December 2015 ECG (independently read by me): Sinus bradycardia at 49 bpm.  Nonspecific ST changes.  QTc interval 424 ms.  ECG (independently read by me): Sinus bradycardia 50 beats per minute.  Nonspecific ST changes.    Prior ECG: Sinus bradycardia at 50 beats per minute. No ectopy on ECG. Intervals normal  LABS: BMP Latest Ref Rng & Units 08/20/2016 08/15/2016 01/08/2016  Glucose 65 - 99 mg/dL 93 99 94  BUN 7 - 25 mg/dL 30(H) 29(H) 24  Creatinine 0.70 - 1.18 mg/dL 1.66(H) 1.48(H) 1.49(H)  Sodium 135 - 146 mmol/L 140 138 141  Potassium 3.5 - 5.3 mmol/L 4.5 3.4(L)  4.6  Chloride 98 - 110 mmol/L 105 106 105  CO2 20 - 31 mmol/L _0 Calcium 8.6  - 10.3 mg/dL 8.8 8.2(L) 8.8   Hepatic Function Latest Ref Rng & Units 12/28/2014 01/05/2014  Total Protein 6.1 - 8.1 g/dL 6.2 6.7  Albumin 3.6 - 5.1 g/dL 3.7 4.4  AST 10 - 35 U/L 17 15  ALT 9 - 46 U/L 15 15  Alk Phosphatase 40 - 115 U/L 117(H) 107  Total Bilirubin 0.2 - 1.2 mg/dL 0.6 0.4   CBC Latest Ref Rng & Units 08/15/2016 12/27/2015 12/28/2014  WBC 4.0 - 10.5 K/uL 5.6 5.0 4.7  Hemoglobin 13.0 - 17.0 g/dL 11.9(L) 13.2 13.3  Hematocrit 39.0 - 52.0 % 34.9(L) 38.6 39.5  Platelets 150 - 400 K/uL 156 158 173   Lab Results  Component Value Date   MCV 100.6 (H) 08/15/2016   MCV 100.5 (H) 12/27/2015   MCV 100.0 12/28/2014   Lab Results  Component Value Date   TSH 3.38 08/20/2016   Lipid Panel     Component Value Date/Time   CHOL 120 (L) 12/28/2014 0924   TRIG 85 12/28/2014 0924   HDL 49 12/28/2014 0924   CHOLHDL 2.4 12/28/2014 0924   VLDL 17 12/28/2014 0924   LDLCALC 54 12/28/2014 0924     RADIOLOGY: No results found.  IMPRESSION:  1. Essential hypertension   2. Paroxysmal atrial fibrillation (HCC)   3. Chronic anticoagulation   4. CKD (chronic kidney disease), stage III   5. Hypothyroidism, unspecified type   6. Mitral valve prolapse     ASSESSMENT AND PLAN: Ms. Hubers is a 77 year old gentleman who has a history of hypertension, hyperlipidemia, hypothyroidism, mitral valve prolapse, as well as paroxysmal atrial fibrillation. He had been maintaining sinus rhythmand was without recurrent atrial fibrillation since July 2017 but developed recurrent atrial fibrillation with rapid ventricular response and was evaluated on 2 consecutive evenings in the emergency room where he was treated with IV metoprolol for rate slowing.  He was advised to take metoprolol at 50 mg's twice a day and with his additional medications.  He became hypotensive with blood pressures in the 80s and subsequently has had  reduction of his prior medications with discontinuance of amlodipine, and reduction  of losartan to 25 mg and metoprololtartrate to 25 mg twice a day.  I saw him in the office 3 weeks ago, he was in atrial fibrillation.  His amiodarone dose was titrated up to 200 mg twice a day.  I reviewed his echo Doppler study with him in detail.  He has bileaflet mild mitral valve prolapse with mild MR.  Left atrial size is normal.  This continues to show normal systolic function.  He was still in AF at the time of the procedure on 09/04/2016.  It appears that he converted back to sinus rhythm on either 4/5-6//2018 and when this occurred he developed bradycardia with rates in the upper 40s.  His metoprolol dose was reduced and ultimately discontinued.  His ECG today shows normal sinus rhythm in the upper 60s with 2 premature atrial complexes.  His blood pressure today is slightly increased by new standards at 140/64 when recheck by me. Presently, I will continue him on amiodarone 200 mg twice a day for at least the next month.  I advised that he can take metoprolol 25 mg on a when necessary basis if he does experience recurrent irregular rhythm suggestive of AF.  I will recheck renal profile in  4 weeks.  When last checked, his creatinine was 1.66.  He discussed that he may need to undergo YAG laser surgery procedure for my recurrent cataract by Dr. Kathrin Penner.  I have recommended that he defer this.  Presently, at least until my next evaluation.  When I see him back in the office following his laboratory, I will know his renal function and whether or not it would be prudent to further titrate losartan for more optimal blood pressure control or perhaps reinstitute low-dose metoprolol if his ventricular rate is stable with plans to gradually reduce his amiodarone dose.  Time spent: 25 minutes Troy Sine, MD, [facility]  09/09/2016 2:29 PM

## 2016-09-09 NOTE — Patient Instructions (Signed)
Your physician has recommended you make the following change in your medication:   1.) Dr Claiborne Billings recommends that you restart the metoprolol ( lopressor) and take only as needed.  Your physician recommends that you return for lab work in: 4 weeks.  Your physician recommends that you schedule a follow-up appointment in: 6 weeks with Dr Claiborne Billings.

## 2016-09-20 DIAGNOSIS — Z0000 Encounter for general adult medical examination without abnormal findings: Secondary | ICD-10-CM | POA: Diagnosis not present

## 2016-09-20 DIAGNOSIS — I10 Essential (primary) hypertension: Secondary | ICD-10-CM | POA: Diagnosis not present

## 2016-09-20 DIAGNOSIS — E559 Vitamin D deficiency, unspecified: Secondary | ICD-10-CM | POA: Diagnosis not present

## 2016-09-20 DIAGNOSIS — E7800 Pure hypercholesterolemia, unspecified: Secondary | ICD-10-CM | POA: Diagnosis not present

## 2016-09-20 DIAGNOSIS — E039 Hypothyroidism, unspecified: Secondary | ICD-10-CM | POA: Diagnosis not present

## 2016-09-25 ENCOUNTER — Telehealth: Payer: Self-pay | Admitting: Cardiovascular Disease

## 2016-09-25 NOTE — Telephone Encounter (Signed)
Returned the phone call to the patient. He stated that his blood pressures were the following:  4/23 145/55 HR 51 after medication 126/56 HR 57 4/24 178/62 HR 49 after medication 152/59 HR 55 4/25 160/61 HR 55 after medication 131/52  The patient stated that he has felt fine but today has been a little weak. He wanted to know if these were too high or they were okay. Will route to the physician for further recommendation.

## 2016-09-25 NOTE — Telephone Encounter (Signed)
New Message   Pt c/o BP issue: STAT if pt c/o blurred vision, one-sided weakness or slurred speech  1. What are your last 5 BP readings? 160/61, 178/62,  2. Are you having any other symptoms (ex. Dizziness, headache, blurred vision, passed out)? A little light headed & weak  3. What is your BP issue? Per pt blood pressure has gradually increased

## 2016-09-26 DIAGNOSIS — E039 Hypothyroidism, unspecified: Secondary | ICD-10-CM | POA: Diagnosis not present

## 2016-09-26 DIAGNOSIS — I4891 Unspecified atrial fibrillation: Secondary | ICD-10-CM | POA: Diagnosis not present

## 2016-09-26 DIAGNOSIS — I10 Essential (primary) hypertension: Secondary | ICD-10-CM | POA: Diagnosis not present

## 2016-09-26 DIAGNOSIS — E7800 Pure hypercholesterolemia, unspecified: Secondary | ICD-10-CM | POA: Diagnosis not present

## 2016-09-27 DIAGNOSIS — I10 Essential (primary) hypertension: Secondary | ICD-10-CM | POA: Diagnosis not present

## 2016-09-30 NOTE — Telephone Encounter (Signed)
Continue same meds

## 2016-09-30 NOTE — Telephone Encounter (Signed)
Returned the phone call to the patient to inform him per Dr. Claiborne Billings to stay on the same medications. He verbalized his understanding.

## 2016-10-07 DIAGNOSIS — I10 Essential (primary) hypertension: Secondary | ICD-10-CM | POA: Diagnosis not present

## 2016-10-07 LAB — BASIC METABOLIC PANEL
BUN: 23 mg/dL (ref 7–25)
CHLORIDE: 105 mmol/L (ref 98–110)
CO2: 28 mmol/L (ref 20–31)
Calcium: 8.9 mg/dL (ref 8.6–10.3)
Creat: 1.68 mg/dL — ABNORMAL HIGH (ref 0.70–1.18)
Glucose, Bld: 91 mg/dL (ref 65–99)
Potassium: 4.7 mmol/L (ref 3.5–5.3)
SODIUM: 141 mmol/L (ref 135–146)

## 2016-10-19 ENCOUNTER — Other Ambulatory Visit: Payer: Self-pay | Admitting: Cardiovascular Disease

## 2016-10-21 ENCOUNTER — Encounter: Payer: Self-pay | Admitting: Cardiovascular Disease

## 2016-10-21 ENCOUNTER — Ambulatory Visit (INDEPENDENT_AMBULATORY_CARE_PROVIDER_SITE_OTHER): Payer: Medicare Other | Admitting: Cardiovascular Disease

## 2016-10-21 VITALS — BP 130/56 | HR 59 | Ht 69.0 in | Wt 161.8 lb

## 2016-10-21 DIAGNOSIS — I341 Nonrheumatic mitral (valve) prolapse: Secondary | ICD-10-CM

## 2016-10-21 DIAGNOSIS — N183 Chronic kidney disease, stage 3 unspecified: Secondary | ICD-10-CM

## 2016-10-21 DIAGNOSIS — I480 Paroxysmal atrial fibrillation: Secondary | ICD-10-CM

## 2016-10-21 DIAGNOSIS — Z7901 Long term (current) use of anticoagulants: Secondary | ICD-10-CM | POA: Diagnosis not present

## 2016-10-21 DIAGNOSIS — I10 Essential (primary) hypertension: Secondary | ICD-10-CM

## 2016-10-21 DIAGNOSIS — E039 Hypothyroidism, unspecified: Secondary | ICD-10-CM

## 2016-10-21 MED ORDER — AMIODARONE HCL 200 MG PO TABS: 300.0000 mg | ORAL_TABLET | Freq: Every day | ORAL | 6 refills | Status: DC

## 2016-10-21 NOTE — Progress Notes (Signed)
Patient ID: Tanner Ho, male   DOB: 07-May-1941, 76 y.o.   MRN: [id number]    Primary M.D.: Dr. [name]  HPI: Tanner Ho is a 76 y.o. male who presents to the office today for a 6 week follow-up cardiology evaluation.  [name] has a history of mitral valve prolapse, hypertension, hyperlipidemia, hypothyroidism, as well as paroxysmal atrial fibrillation. He is on chronic Coumadin anticoagulation. His last documented atrial fibrillation episode was in October 2013. A nuclear perfusion study in October 2013 revealed normal perfusion imaging but he developed 1-2 mm of ST segment depression at peak stress test and the possibility of microvascular etiology leading to his ECG abnormalities. A cardiopulmonary met test on 09/17/2012 demonstarated a reduced peak maximum oxygen consumption at 58%. He had a suboptimal peak cardiovascular stress load making cardiovascular status interpretation indeterminate. He did have mild ventilation/perfusion mismatch suggesting impaired ulnar circulation plus minus increased dead space. He had a blunted chronotropic response to exercise. The test was limited by leg fatigue. When I saw him in the office subsequently I reduced his Cardizem from 240 to180 mg. At times, he notes a rare palpitation. He denies any awareness of breakthrough atrial fibrillation.   He presented to the emergency room in November 2014 with some vague chest pain. In retrospect, he feels this may have been GERD symptoms. Cardiac enzymes were negative. ECG was without changes. A nuclear perfusion study on 04/22/2013 was low risk and showed mild diaphragmatic attenuation but did not show a region of scar or ischemia, unchanged from his prior study of several years previously.  In 2015 he had noticed some dizziness and decreased energy   A cardiac monitor revealed episodes of PACs, bradycardia, with heart rates down to 48, but he also had a 6 beat run of wide-complex tachycardia at a rate of 131.   Remotely, he had developed breakthrough atrial fibrillation on amiodarone 100 mg and he has been on 100 mg, alternating with 200 mg daily.  On this most recent monitor, there were no episodes of recurrent AF.   He has been on amiodarone 100 mg daily, Norvasc 2.5 mg, losartan 50 mg bid, HCTZ 12.5 mg daily, Toprol-XL 12.5 mg daily, Crestor 5 mg and Coumadin.  He also has noticed rare episodes of vertigo for which he takes meclizine.    He also has been on Uroxatral to improve urinary flow. When I last saw him we had a lengthy discussion concerning warfarin versus new or anticoagulants.  He opted to switch to Xarelto and has been maintained on 15 mg daily based on his creatinine clearance.  He has tolerated this well without bleeding.  He was evaluated by [facility] the office on 12/27/2015 and had noticed an occasional irregularity of his heartbeat.  When she evaluated him, he was still in sinus rhythm without ectopy and there were no acute changes.  Presently continues to feel that his pulse has been stable.  He denies chest pain.  He denies presyncope or syncope.  When I saw him in follow-up, he was in sinus rhythm.  When I saw [name] 1 week ago for a follow-up cardiology evaluation, he was very stable.  He was denying any episodes of palpitations.  He is not required use of when necessary metoprolol.  His ECG revealed sinus bradycardia at 58 bpm.  He was unaware of any any heart rate irregularity. Dr. Shelia Ho had checked his laboratory.  He denied any episodes of chest pain, presyncope or syncope.  He developed recurrent atrial fibrillation with a rapid ventricular response and presented to [facility] on March 15.  He was given metoprolol 5 mg intravenously which brought his heart rate down.  He went home after several hours.  The following morning he was advised to increase amiodarone from 100 mg to 200 mg daily.  That night, again he develop recurrent AF with rapid ventricular response and repeat  presented to the emergency room on 08/16/2016.  At this time.  He was given metoprolol IV 2.5 mg 2.  He also was advised to take metoprolol 50 mg twice a day.  On this increased beta blocker dose, his blood pressure became low and he called and spoke with [facility] Ho several days later.  His medications were adjusted and he was told to stop amlodipine, reduce losartan to 25 mg, and reduce metoprolol to 25 mg twice a day.  When I last saw him 3 weeks ago, he was in atrial fibrillation with ventricular rate in the 90s.  I recommended further titration of amiodarone and increase this to 200 mg twice a day.  Also is on levothyroxine at 50 g.  TSH was normal at 3.38.  He has stage III chronic kidney disease.  He underwent a 2-D echo Doppler study on 09/04/2016 and was still in atrial fibrillation at that time.  Ejection fraction was 55-60%.  There was mild late systolic prolapse of both leaflets with mild MR.  There was mild PA pressure elevation at 39 mm.  Aortic root dimension measured 39 mm.  On the following evening, he began to notice his heart rate reducing down into the upper 40s to 50s and his rhythm appeared more regular. He called the office and was advised to reduce his metoprolol dose.  He also a completely discontinue this altogether.  He now notes his pulse typically in the 60s.  His blood pressure has risen to the 140s. He presents for reevaluation.  WhenI last saw him, he was still in atrial fibrillation.  I recommended he continue amiodarone 200 mg twice a day at least for the next month.  At that time, I recommended he defer undergoing his YAG laser surgery procedure until his atrial fibrillation was controlled.  Over the past month, he underwent laboratory at [facility].  Hemoglobin and hematocrit were stable.  His potassium had increased to 5.4.  BUN 21, Cr 1.7.  Subsequent blood work one week ago showed potassium improved to 4.7.  He is unaware of any heart rate irregularity.  He  presents for evaluation.  Past Medical History:  Diagnosis Date  . Adrenal mass ([place])   . Asthma   . BPH (benign prostatic hyperplasia)   . CKD (chronic kidney disease), stage III   . GERD (gastroesophageal reflux disease)   . H/O cardiovascular stress test 03/2012   EKG with 1-2 mm ST segment depression but normal perfusion images. Followup metastases test reduced exercise effort and functional status and determinate for myocardial dysfunction  . History of cardiac monitoring    a. 2015: cardiac monitor revealed episodes of PACs, bradycardia, with heart rates down to 48, 4 beat PSVT, 6 beat run of NSVT at a rate of 131. There was no recurrent afib.   Marland Kitchen Hyperlipidemia LDL goal < 100   . Hypertension   . Hypothyroidism   . Macular degeneration   . MVP (mitral valve prolapse)    on cardiac cath - not mentioned on 2014 echo.  Marland Kitchen NSVT (nonsustained ventricular tachycardia) (  [place])   . PAF (paroxysmal atrial fibrillation) ([place])    last episode 03/2012  . Pericarditis   . Premature atrial contractions   . Sinus bradycardia     Past Surgical History:  Procedure Laterality Date  . ABDOMINAL VASCULAR ULTRASOUND EVALUATION  12/20/2008   mild non-obstructive plaque, abdominal aorta, with mild calcification at the bifurcation-no obstruction, no evidence for aneurysm of the abdominal aorta  . CARDIAC CATHETERIZATION  01/04/1999   hyperdynamic LV function, probable angiographic mitral valve prolapse, systolic muscle bridging of the mid LAD without obstructive disease, systolic narrowing up to 28%  . Cardiopulmonary MET-test  07/20/2012   reduced exercise effort and functional status, peak max O2 comsumption was only 58% of predicted, cardiovascular status was interpreted as indeterminate for myocardial dysfunction due to the suboptimal peak cardiovascular stress load.    Allergies  Allergen Reactions  . Ciprofloxacin Other (See Comments)    Cannot take due to currently taking Amiodarone  . Hytrin  [Terazosin] Other (See Comments)    Syncopal event     Current Outpatient Prescriptions  Medication Sig Dispense Refill  . acetaminophen (TYLENOL 8 HOUR ARTHRITIS PAIN) 650 MG CR tablet Take 650 mg by mouth every 8 (eight) hours as needed for pain.    Marland Kitchen acetaminophen (TYLENOL) 500 MG tablet Take 500 mg by mouth every 6 (six) hours as needed for mild pain.    Marland Kitchen alfuzosin (UROXATRAL) 10 MG 24 hr tablet Take 10 mg by mouth daily.    Marland Kitchen amiodarone (PACERONE) 200 MG tablet Take 1.5 tablets (300 mg total) by mouth daily. 60 tablet 6  . Cholecalciferol (VITAMIN D-3) 1000 UNITS CAPS Take 1 capsule by mouth daily.     Marland Kitchen docusate sodium (COLACE) 100 MG capsule Take 100 mg by mouth daily.    . finasteride (PROSCAR) 5 MG tablet Take 5 mg by mouth daily.    . fish oil-omega-3 fatty acids 1000 MG capsule Take 1 g by mouth 2 (two) times daily.    . hydrochlorothiazide (HYDRODIURIL) 25 MG tablet Take 12.5 mg by mouth every other day. mwf    . levothyroxine (SYNTHROID, LEVOTHROID) 50 MCG tablet Take 1 tablet by mouth daily.  2  . loratadine (CLARITIN) 10 MG tablet Take 10 mg by mouth daily as needed for allergies.    Marland Kitchen losartan (COZAAR) 25 MG tablet Take 25 mg by mouth 2 (two) times daily.     . magnesium hydroxide (MILK OF MAGNESIA) 800 MG/5ML suspension Take 5 mLs by mouth daily as needed for constipation.    . meclizine (ANTIVERT) 25 MG tablet Take 25 mg by mouth 3 (three) times daily as needed for dizziness.   2  . metoprolol tartrate (LOPRESSOR) 25 MG tablet Take 1 tablet as needed for palpitations 30 tablet 3  . Multiple Vitamins-Minerals (PRESERVISION AREDS) CAPS Take 1 capsule by mouth 2 (two) times daily. VITAMIN A FREE    . Polyethyl Glycol-Propyl Glycol (SYSTANE) 0.4-0.3 % SOLN Apply 2 drops to eye daily as needed (for dry eeys).    . polyethylene glycol (MIRALAX / GLYCOLAX) packet Take 17 g by mouth daily as needed for mild constipation.     . ranitidine (ZANTAC) 150 MG tablet Take 150 mg by mouth  daily as needed for heartburn.    Alveda Reasons 15 MG TABS tablet TAKE 1 TABLET (15 MG TOTAL) BY MOUTH DAILY WITH SUPPER. 30 tablet 7  . rosuvastatin (CRESTOR) 5 MG tablet TAKE 1 TABLET (5 MG TOTAL) BY MOUTH DAILY  AT 6 PM. 30 tablet 11   No current facility-administered medications for this visit.     Social History   Social History  . Marital status: Married    Spouse name: N/A  . Number of children: N/A  . Years of education: N/A   Occupational History  . Not on file.   Social History Main Topics  . Smoking status: Never Smoker  . Smokeless tobacco: Never Used  . Alcohol use No  . Drug use: No  . Sexual activity: Not on file   Other Topics Concern  . Not on file   Social History Narrative  . No narrative on file    Social history is notable in that he is married. 2 children 4 grandchildren. No tobacco alcohol use.  ROS General: Negative; No fevers, chills, or night sweats;  HEENT: Negative; No changes in vision or hearing, sinus congestion, difficulty swallowing Pulmonary: Negative; No cough, wheezing, shortness of breath, hemoptysis Cardiovascular: Negative; No chest pain, presyncope, syncope, palpitations Edema has improved GI: Positive for GERD; No nausea, vomiting, diarrhea, or abdominal pain GU: Negative; No dysuria, hematuria, or difficulty voiding Musculoskeletal: Negative; no myalgias, joint pain, or weakness Hematologic/Oncology: Negative; no easy bruising, bleeding Endocrine: Negative; no heat/cold intolerance; no diabetes Neuro: Negative; no changes in balance, headaches Skin: Negative; No rashes or skin lesions Psychiatric: Negative; No behavioral problems, depression Sleep: Negative; No snoring, daytime sleepiness, hypersomnolence, bruxism, restless legs, hypnogognic hallucinations, no cataplexy Other comprehensive 14 point system review is negative.   PE BP (!) 130/56 (BP Location: Right Arm, Patient Position: Sitting, Cuff Size: Normal)   Pulse (!)  59   Ht '5\' 9"'  (1.753 m)   Wt 161 lb 12.8 oz (73.4 kg)   BMI 23.89 kg/m    Repeat blood pressure by me was 138/70  Wt Readings from Last 3 Encounters:  10/21/16 161 lb 12.8 oz (73.4 kg)  09/09/16 160 lb (72.6 kg)  08/20/16 158 lb (71.7 kg)   General: Alert, oriented, no distress.  Skin: normal turgor, no rashes, warm and dry HEENT: Normocephalic, atraumatic. Pupils equal round and reactive to light; sclera anicteric; extraocular muscles intact;  Nose without nasal septal hypertrophy Mouth/Parynx benign; Mallinpatti scale 3 Neck: No JVD, no carotid bruits; normal carotid upstroke Lungs: clear to ausculatation and percussion; no wheezing or rales Chest wall: without tenderness to palpitation Heart: PMI not displaced, RRR, s1 s2 normal, 1/6 systolic murmur, no diastolic murmur, no rubs, gallops, thrills, or heaves Abdomen: soft, nontender; no hepatosplenomehaly, BS+; abdominal aorta nontender and not dilated by palpation. Back: no CVA tenderness Pulses 2+ Musculoskeletal: full range of motion, normal strength, no joint deformities Extremities: Trivial ankle edema no clubbing cyanosis, Homan's sign negative  Neurologic: grossly nonfocal; Cranial nerves grossly wnl Psychologic: Normal mood and affect   ECG (independently read by me): Sinus bradycardia 59 bpm.  Possible left atrial enlargement.  No ST segment changes.  QTc interval 463 ms.  ECG (independently read by me): Normal sinus rhythm at 69 bpm.  PACs, nonspecific ST changes.  Normal intervals.  08/20/2016 ECG (independently read by me): Atrial fibrillation with ventricular rate at 96 bpm.  Nonspecific ST-T changes.  Lateral T-wave changes.  08/13/2016 ECG (independently read by me): Sinus bradycardia 58 bpm.  Nonspecific ST changes.  Normal intervals.  September 2017 ECG (independently read by me): Sinus rhythm at 68.  Normal intervals.  No significant ST-T changes.  December 2016 ECG (independently read by me): Normal sinus  rhythm at 61 bpm.  No ectopy.  QTc interval 434 ms.  June 2016 ECG (independently read by me): Sinus bradycardia 52 bpm.  QTc interval 407 ms.  PR interval 158 msec  December 2015 ECG (independently read by me): Sinus bradycardia at 49 bpm.  Nonspecific ST changes.  QTc interval 424 ms.  ECG (independently read by me): Sinus bradycardia 50 beats per minute.  Nonspecific ST changes.    Prior ECG: Sinus bradycardia at 50 beats per minute. No ectopy on ECG. Intervals normal  LABS: BMP Latest Ref Rng & Units 10/07/2016 08/20/2016 08/15/2016  Glucose 65 - 99 mg/dL 91 93 99  BUN 7 - 25 mg/dL 23 30(H) 29(H)  Creatinine 0.70 - 1.18 mg/dL 1.68(H) 1.66(H) 1.48(H)  Sodium 135 - 146 mmol/L 141 140 138  Potassium 3.5 - 5.3 mmol/L 4.7 4.5 3.4(L)  Chloride 98 - 110 mmol/L 105 105 106  CO2 20 - 31 mmol/L '28 26 22  ' Calcium 8.6 - 10.3 mg/dL 8.9 8.8 8.2(L)   Hepatic Function Latest Ref Rng & Units 12/28/2014 01/05/2014  Total Protein 6.1 - 8.1 g/dL 6.2 6.7  Albumin 3.6 - 5.1 g/dL 3.7 4.4  AST 10 - 35 U/L 17 15  ALT 9 - 46 U/L 15 15  Alk Phosphatase 40 - 115 U/L 117(H) 107  Total Bilirubin 0.2 - 1.2 mg/dL 0.6 0.4   CBC Latest Ref Rng & Units 08/15/2016 12/27/2015 12/28/2014  WBC 4.0 - 10.5 K/uL 5.6 5.0 4.7  Hemoglobin 13.0 - 17.0 g/dL 11.9(L) 13.2 13.3  Hematocrit 39.0 - 52.0 % 34.9(L) 38.6 39.5  Platelets 150 - 400 K/uL 156 158 173   Lab Results  Component Value Date   MCV 100.6 (H) 08/15/2016   MCV 100.5 (H) 12/27/2015   MCV 100.0 12/28/2014   Lab Results  Component Value Date   TSH 3.38 08/20/2016   Lipid Panel     Component Value Date/Time   CHOL 120 (L) 12/28/2014 0924   TRIG 85 12/28/2014 0924   HDL 49 12/28/2014 0924   CHOLHDL 2.4 12/28/2014 0924   VLDL 17 12/28/2014 0924   LDLCALC 54 12/28/2014 0924     RADIOLOGY: No results found.  IMPRESSION:  1. PAF (paroxysmal atrial fibrillation), recurrent 03/12/12   2. Essential hypertension   3. Hypothyroidism, unspecified type   4.  Chronic anticoagulation   5. Mitral valve prolapse   6. CKD (chronic kidney disease), stage III     ASSESSMENT AND PLAN: Ms. Steve is a 76 year old gentleman who has a history of hypertension, hyperlipidemia, hypothyroidism, mitral valve prolapse, as well as paroxysmal atrial fibrillation. He had been maintaining sinus rhythm and was without recurrent atrial fibrillation until July 2017 when he developed recurrent atrial fibrillation with rapid ventricular response and was evaluated on 2 consecutive evenings in the emergency room where he was treated with IV metoprolol for rate slowing.  He was advised to take metoprolol at 50 mg's twice a day and with his additional medications.  He became hypotensive with blood pressures in the 80s and subsequently has had  reduction of his prior medications with discontinuance of amlodipine, and reduction of losartan to 25 mg and metoprololtartrate to 25 mg twice a day.  I saw him in the office 3 weeks ago, he was in atrial fibrillation.  His amiodarone dose was titrated up to 200 mg twice a day.  I reviewed his echo Doppler study with him in detail.  He has bileaflet mild mitral valve prolapse with mild MR.  Left atrial size  is normal.  This continues to show normal systolic function.  He was still in AF at the time of the procedure on 09/04/2016.  It appears that he converted back to sinus rhythm on either 4/5-6//2018 and when this occurred he developed bradycardia with rates in the upper 40s.  His metoprolol dose was reduced and ultimately discontinued.  Since I last saw him, he is maintaining sinus rhythm.  I will decrease amiodarone gently down to 300 mg daily.  I also have recommended he change HCTZ to 12.5 mg every other day at of 3 days per week with his very mild edema.  He has not required PRN metoprolol, but has a prescription if necessary.  Blood pressure at home has been typically running in the 120 - 140 range.  He continues to be on anticoagulation with  Xarelto denies bleeding.  I reviewed the laboratory done [facility].  Lipid studies.  He made excellent with a total cholesterol 131, triglycerides 38, HDL 70, LDL 53 on his current low-dose rosuvastatin at 5 mg.  He has stage III chronic kidney disease.  His most recent creatinine had improved at 1.6.  Time spent: 25 minutes Troy Sine, MD, [facility]  10/23/2016 6:14 PM

## 2016-10-21 NOTE — Patient Instructions (Signed)
Your physician has recommended you make the following change in your medication:   1.) the amiodarone has been decreased to 300 mg  ONCE A DAY. ( 1 & 1/2 tablet)  2.) the Hydrochlorothiazide has been changed to [place].   Your physician recommends that you schedule a follow-up appointment in: 3 months with Dr Claiborne Billings.

## 2016-10-23 DIAGNOSIS — H43811 Vitreous degeneration, right eye: Secondary | ICD-10-CM | POA: Diagnosis not present

## 2016-10-23 DIAGNOSIS — H26492 Other secondary cataract, left eye: Secondary | ICD-10-CM | POA: Diagnosis not present

## 2016-10-23 DIAGNOSIS — H43812 Vitreous degeneration, left eye: Secondary | ICD-10-CM | POA: Diagnosis not present

## 2016-10-23 DIAGNOSIS — H353134 Nonexudative age-related macular degeneration, bilateral, advanced atrophic with subfoveal involvement: Secondary | ICD-10-CM | POA: Diagnosis not present

## 2016-10-29 DIAGNOSIS — H02102 Unspecified ectropion of right lower eyelid: Secondary | ICD-10-CM | POA: Diagnosis not present

## 2016-10-29 DIAGNOSIS — H02105 Unspecified ectropion of left lower eyelid: Secondary | ICD-10-CM | POA: Diagnosis not present

## 2016-11-21 DIAGNOSIS — N281 Cyst of kidney, acquired: Secondary | ICD-10-CM | POA: Diagnosis not present

## 2016-11-21 DIAGNOSIS — N401 Enlarged prostate with lower urinary tract symptoms: Secondary | ICD-10-CM | POA: Diagnosis not present

## 2016-11-21 DIAGNOSIS — R3912 Poor urinary stream: Secondary | ICD-10-CM | POA: Diagnosis not present

## 2016-11-28 DIAGNOSIS — H26492 Other secondary cataract, left eye: Secondary | ICD-10-CM | POA: Diagnosis not present

## 2017-01-01 ENCOUNTER — Telehealth: Payer: Self-pay | Admitting: Cardiovascular Disease

## 2017-01-01 NOTE — Telephone Encounter (Signed)
Currently taking:   Losartan 25mg  twice daily   HCTZ 12.5mg  every other day   Metoprolol 25mg  PRN  Patient reporting elevated systolic BP with headaches since Sunday. No metoprolol usage and HR in the 50s   Recommendation:  1. Increase losartan to 50mg  every morning and 25mg  every evening  2. Proper hydration  3. Monitor BP twice daily and keep log to bring to office visit  4. Continue all other medication as prescribed  5. Bring home BP device to f/u appointment with HTN clinic in 2 weeks  6. Call back if additional problems noted prior to appt on 01/15/17

## 2017-01-01 NOTE — Telephone Encounter (Signed)
Per pt's wife call    Sunday BP 174          Today    BP   181    The top number.  Pt's wife would like to know what to do should she increase his meds.   LOSARTIN ???   Please give her a call back.

## 2017-01-01 NOTE — Telephone Encounter (Signed)
Called and spoke with patient  Sunday 5/92 - 924 systolic then was in high 140s Today 8/1 - 462 systolic Normally runs 863-817 systolic  Patient states he has had slight headaches, nagging headache No visual changes, no speech/swallowing difficulties, no unilateral weakness/instability  Informed patient that MD is out of the office, but will have pharmacy staff review his BP for recommendations. Informed him that he may need to track home BPs for longer period of time to see trends in order to determine if/how to adjust his medications. Informed him will follow up with him today

## 2017-01-08 ENCOUNTER — Ambulatory Visit: Payer: Self-pay | Admitting: Podiatry

## 2017-01-09 ENCOUNTER — Ambulatory Visit: Payer: Self-pay | Admitting: Podiatry

## 2017-01-15 ENCOUNTER — Ambulatory Visit (INDEPENDENT_AMBULATORY_CARE_PROVIDER_SITE_OTHER): Payer: Medicare Other | Admitting: Pharmacist

## 2017-01-15 VITALS — BP 138/60 | HR 53 | Wt 154.6 lb

## 2017-01-15 DIAGNOSIS — I10 Essential (primary) hypertension: Secondary | ICD-10-CM | POA: Diagnosis not present

## 2017-01-15 NOTE — Patient Instructions (Addendum)
Return for a  follow up appointment with Dr Claiborne Billings 02/04/2017  Your blood pressure today is 138/60 pulse 53  Check your blood pressure at home daily (if able) and keep record of the readings.  Take your BP meds as follows: *Continue all medication as prescribed* Repeat blood work today   Bring  your BP cuff and your record of home blood pressures to your next appointment.  Exercise as you're able, try to walk approximately 30 minutes per day.  Keep salt intake to a minimum, especially watch canned and prepared boxed foods.  Eat more fresh fruits and vegetables and fewer canned items.  Avoid eating in fast food restaurants.    HOW TO TAKE YOUR BLOOD PRESSURE: . Rest 5 minutes before taking your blood pressure. .  Don't smoke or drink caffeinated beverages for at least 30 minutes before. . Take your blood pressure before (not after) you eat. . Sit comfortably with your back supported and both feet on the floor (don't cross your legs). . Elevate your arm to heart level on a table or a desk. . Use the proper sized cuff. It should fit smoothly and snugly around your bare upper arm. There should be enough room to slip a fingertip under the cuff. The bottom edge of the cuff should be 1 inch above the crease of the elbow. . Ideally, take 3 measurements at one sitting and record the average.

## 2017-01-15 NOTE — Progress Notes (Signed)
Patient ID: Tanner Ho                 DOB: 06-Apr-1941                      MRN: [id number]     HPI: Tanner Ho is a 76 y.o. male referred by Dr. Claiborne Billings to HTN clinic.  PMH includes mitral valve prolapse, hypertension, hyperlipidemia, hypothyroidism and atrial fibrillation. Patient reported increase in BP readings recently. Losartan dose was changed from 25mg  twice daily yo 50mg  every morning and  25mg  every evening. Patient denies problems with current therapy. Reports some minor light-headed upon standing but no falls, chest pain or increase fatigue. Will repeat BMET today to re-assess renal function before adjusting medication any further.  Current HTN meds:  Losartan 50mg  every morning and 25mg  every evening HCTZ 12.5mg  every other day Metoprolol tartrate 25mg  as needed for palpitations (not needed in last few months)  Previously tried:  amlodipine 2.5mg  daily Losartan 50mg  twice daily HCTZ 12.5mg  daily Metoprolol succinate 12.5mg  daily  BP goal: <130/80  Social History: denies alcohol and tobacco products  Diet: low sodium, mainly home cooked meals, occasional decaf coffee  Exercise: walking M-F for 30-45 minutes  Home BP readings: no records available Arm cuff Omron intellisense - accurate with < 82mmHg difference from manual reading Wrist cuff Omron - inaccurate readings above 87mmHg difference from manual reading  Wt Readings from Last 3 Encounters:  01/15/17 154 lb 9.6 oz (70.1 kg)  10/21/16 161 lb 12.8 oz (73.4 kg)  09/09/16 160 lb (72.6 kg)   BP Readings from Last 3 Encounters:  01/15/17 138/60  10/21/16 (!) 130/56  09/09/16 (!) 140/50   Pulse Readings from Last 3 Encounters:  01/15/17 (!) 53  10/21/16 (!) 59  09/09/16 68    Past Medical History:  Diagnosis Date  . Adrenal mass ([place])   . Asthma   . BPH (benign prostatic hyperplasia)   . CKD (chronic kidney disease), stage III   . GERD (gastroesophageal reflux disease)   . H/O cardiovascular stress  test 03/2012   EKG with 1-2 mm ST segment depression but normal perfusion images. Followup metastases test reduced exercise effort and functional status and determinate for myocardial dysfunction  . History of cardiac monitoring    a. 2015: cardiac monitor revealed episodes of PACs, bradycardia, with heart rates down to 48, 4 beat PSVT, 6 beat run of NSVT at a rate of 131. There was no recurrent afib.   Marland Kitchen Hyperlipidemia LDL goal < 100   . Hypertension   . Hypothyroidism   . Macular degeneration   . MVP (mitral valve prolapse)    on cardiac cath - not mentioned on 2014 echo.  Marland Kitchen NSVT (nonsustained ventricular tachycardia) ([place])   . PAF (paroxysmal atrial fibrillation) ([place])    last episode 03/2012  . Pericarditis   . Premature atrial contractions   . Sinus bradycardia     Current Outpatient Prescriptions on File Prior to Visit  Medication Sig Dispense Refill  . acetaminophen (TYLENOL) 500 MG tablet Take 500 mg by mouth every 6 (six) hours as needed for mild pain.    Marland Kitchen alfuzosin (UROXATRAL) 10 MG 24 hr tablet Take 10 mg by mouth daily.    Marland Kitchen amiodarone (PACERONE) 200 MG tablet Take 1.5 tablets (300 mg total) by mouth daily. 60 tablet 6  . Cholecalciferol (VITAMIN D-3) 1000 UNITS CAPS Take 1 capsule by mouth daily.     Marland Kitchen  docusate sodium (COLACE) 100 MG capsule Take 100 mg by mouth daily.    . finasteride (PROSCAR) 5 MG tablet Take 5 mg by mouth daily.    . fish oil-omega-3 fatty acids 1000 MG capsule Take 1 g by mouth 2 (two) times daily.    . hydrochlorothiazide (HYDRODIURIL) 25 MG tablet Take 12.5 mg by mouth every other day. mwf    . levothyroxine (SYNTHROID, LEVOTHROID) 50 MCG tablet Take 1 tablet by mouth daily.  2  . loratadine (CLARITIN) 10 MG tablet Take 10 mg by mouth daily as needed for allergies.    Marland Kitchen losartan (COZAAR) 25 MG tablet Take 25 mg by mouth 2 (two) times daily.     . meclizine (ANTIVERT) 25 MG tablet Take 25 mg by mouth 3 (three) times daily as needed for dizziness.    2  . rosuvastatin (CRESTOR) 5 MG tablet TAKE 1 TABLET (5 MG TOTAL) BY MOUTH DAILY AT 6 PM. 30 tablet 11  . XARELTO 15 MG TABS tablet TAKE 1 TABLET (15 MG TOTAL) BY MOUTH DAILY WITH SUPPER. 30 tablet 7  . acetaminophen (TYLENOL 8 HOUR ARTHRITIS PAIN) 650 MG CR tablet Take 650 mg by mouth every 8 (eight) hours as needed for pain.    . magnesium hydroxide (MILK OF MAGNESIA) 800 MG/5ML suspension Take 5 mLs by mouth daily as needed for constipation.    . metoprolol tartrate (LOPRESSOR) 25 MG tablet Take 1 tablet as needed for palpitations (Patient not taking: Reported on 01/15/2017) 30 tablet 3  . Multiple Vitamins-Minerals (PRESERVISION AREDS) CAPS Take 1 capsule by mouth 2 (two) times daily. VITAMIN A FREE    . Polyethyl Glycol-Propyl Glycol (SYSTANE) 0.4-0.3 % SOLN Apply 2 drops to eye daily as needed (for dry eeys).    . polyethylene glycol (MIRALAX / GLYCOLAX) packet Take 17 g by mouth daily as needed for mild constipation.     . ranitidine (ZANTAC) 150 MG tablet Take 150 mg by mouth daily as needed for heartburn.     No current facility-administered medications on file prior to visit.     Allergies  Allergen Reactions  . Ciprofloxacin Other (See Comments)    Cannot take due to currently taking Amiodarone  . Hytrin [Terazosin] Other (See Comments)    Syncopal event     Blood pressure 138/60, pulse (!) 53, weight 154 lb 9.6 oz (70.1 kg), SpO2 99 %.  HTN (hypertension) Blood pressure well controlled today. Patient also stable and denies any problems with current therapy. Will continue therapy without changes and repeat BMET today. Follow up with Dr Claiborne Billings already scheduled in 3 weeks.   Deren Degrazia Rodriguez-Guzman PharmD, [facility] [place] [id number] 01/15/2017 8:50 PM

## 2017-01-15 NOTE — Assessment & Plan Note (Signed)
Blood pressure well controlled today. Patient also stable and denies any problems with current therapy. Will continue therapy without changes and repeat BMET today. Follow up with Dr Claiborne Billings already scheduled in 3 weeks.

## 2017-01-16 LAB — BASIC METABOLIC PANEL
BUN / CREAT RATIO: 17 (ref 10–24)
BUN: 31 mg/dL — AB (ref 8–27)
CO2: 25 mmol/L (ref 20–29)
CREATININE: 1.78 mg/dL — AB (ref 0.76–1.27)
Calcium: 9.1 mg/dL (ref 8.6–10.2)
Chloride: 95 mmol/L — ABNORMAL LOW (ref 96–106)
GFR, EST AFRICAN AMERICAN: 42 mL/min/{1.73_m2} — AB (ref >59)
GFR, EST NON AFRICAN AMERICAN: 36 mL/min/{1.73_m2} — AB (ref >59)
GLUCOSE: 83 mg/dL (ref 65–99)
POTASSIUM: 5.1 mmol/L (ref 3.5–5.2)
SODIUM: 137 mmol/L (ref 134–144)

## 2017-01-17 ENCOUNTER — Other Ambulatory Visit: Payer: Self-pay | Admitting: *Deleted

## 2017-01-24 ENCOUNTER — Ambulatory Visit (INDEPENDENT_AMBULATORY_CARE_PROVIDER_SITE_OTHER): Payer: Medicare Other

## 2017-01-24 ENCOUNTER — Ambulatory Visit (INDEPENDENT_AMBULATORY_CARE_PROVIDER_SITE_OTHER): Payer: Medicare Other | Admitting: Podiatry

## 2017-01-24 DIAGNOSIS — M79671 Pain in right foot: Secondary | ICD-10-CM

## 2017-01-24 DIAGNOSIS — M779 Enthesopathy, unspecified: Secondary | ICD-10-CM

## 2017-01-24 DIAGNOSIS — M216X9 Other acquired deformities of unspecified foot: Secondary | ICD-10-CM

## 2017-01-24 MED ORDER — TRIAMCINOLONE ACETONIDE 10 MG/ML IJ SUSP
10.0000 mg | Freq: Once | INTRAMUSCULAR | Status: AC
  Administered 2017-01-24: 10 mg

## 2017-01-24 NOTE — Progress Notes (Signed)
Subjective:    Patient ID: Tanner Ho, male   DOB: 76 y.o.   MRN: [id number]   HPI patient points the bottom of the right foot stating it's been very sore and it's been going on for at least a month. Does not remember specific injury but it makes it hard for him to walk on the inside of his foot    Review of Systems  All other systems reviewed and are negative.       Objective:  Physical Exam  Constitutional: He appears well-developed and well-nourished.  Cardiovascular: Intact distal pulses.   Pulmonary/Chest: Breath sounds normal.  Musculoskeletal: Normal range of motion.  Neurological: He is alert.  Skin: Skin is warm.  Nursing note and vitals reviewed.  neurovascular status found to be intact muscle strength was adequate range of motion within normal limits with patient noted to have discomfort around the first metatarsal head right with moderate plantar flexion of the first metatarsal and edema around the first MPJ. It is not severe but it is tender and it occurs all the way around the joint surface. 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 Patient will be seen back to recheck  X-rays were negative for signs of fracture it appears to be a soft tissue event

## 2017-02-04 ENCOUNTER — Ambulatory Visit (INDEPENDENT_AMBULATORY_CARE_PROVIDER_SITE_OTHER): Payer: Medicare Other | Admitting: Cardiovascular Disease

## 2017-02-04 ENCOUNTER — Encounter: Payer: Self-pay | Admitting: Cardiovascular Disease

## 2017-02-04 VITALS — BP 147/64 | HR 64 | Ht 68.0 in | Wt 154.4 lb

## 2017-02-04 DIAGNOSIS — N183 Chronic kidney disease, stage 3 unspecified: Secondary | ICD-10-CM

## 2017-02-04 DIAGNOSIS — Z7901 Long term (current) use of anticoagulants: Secondary | ICD-10-CM | POA: Diagnosis not present

## 2017-02-04 DIAGNOSIS — I480 Paroxysmal atrial fibrillation: Secondary | ICD-10-CM

## 2017-02-04 DIAGNOSIS — I341 Nonrheumatic mitral (valve) prolapse: Secondary | ICD-10-CM | POA: Diagnosis not present

## 2017-02-04 DIAGNOSIS — Z79899 Other long term (current) drug therapy: Secondary | ICD-10-CM | POA: Diagnosis not present

## 2017-02-04 DIAGNOSIS — E785 Hyperlipidemia, unspecified: Secondary | ICD-10-CM

## 2017-02-04 DIAGNOSIS — I10 Essential (primary) hypertension: Secondary | ICD-10-CM

## 2017-02-04 MED ORDER — HYDROCHLOROTHIAZIDE 25 MG PO TABS: 12.5000 mg | ORAL_TABLET | ORAL | 3 refills | Status: DC

## 2017-02-04 NOTE — Progress Notes (Signed)
Patient ID: Tanner Ho, male   DOB: [birth year], 76 y.o.   MRN: [id number]    Primary M.D.: Dr. [name]  HPI: Tanner Ho is a 76 y.o. male who presents to the office today for a 3 month  follow-up cardiology evaluation.  Tanner Ho has a history of mitral valve prolapse, hypertension, hyperlipidemia, hypothyroidism, as well as paroxysmal atrial fibrillation. He is on chronic Coumadin anticoagulation. His last documented atrial fibrillation episode was in October 2013. A nuclear perfusion study in October 2013 revealed normal perfusion imaging but he developed 1-2 mm of ST segment depression at peak stress test and the possibility of microvascular etiology leading to his ECG abnormalities. A cardiopulmonary met test on 09/17/2012 demonstarated a reduced peak maximum oxygen consumption at 58%. He had a suboptimal peak cardiovascular stress load making cardiovascular status interpretation indeterminate. He did have mild ventilation/perfusion mismatch suggesting impaired ulnar circulation plus minus increased dead space. He had a blunted chronotropic response to exercise. The test was limited by leg fatigue. When I saw him in the office subsequently I reduced his Cardizem from 240 to180 mg. At times, he notes a rare palpitation. He denies any awareness of breakthrough atrial fibrillation.   He presented to the emergency room in November 2014 with some vague chest pain. In retrospect, he feels this may have been GERD symptoms. Cardiac enzymes were negative. ECG was without changes. A nuclear perfusion study on 04/22/2013 was low risk and showed mild diaphragmatic attenuation but did not show a region of scar or ischemia, unchanged from his prior study of several years previously.  In 2015 he had noticed some dizziness and decreased energy   A cardiac monitor revealed episodes of PACs, bradycardia, with heart rates down to 48, but he also had a 6 beat run of wide-complex tachycardia at a rate of 131.   Remotely, he had developed breakthrough atrial fibrillation on amiodarone 100 mg and he has been on 100 mg, alternating with 200 mg daily.  On this most recent monitor, there were no episodes of recurrent AF.   He has been on amiodarone 100 mg daily, Norvasc 2.5 mg, losartan 50 mg bid, HCTZ 12.5 mg daily, Toprol-XL 12.5 mg daily, Crestor 5 mg and Coumadin.  He also has noticed rare episodes of vertigo for which he takes meclizine.    He also has been on Uroxatral to improve urinary flow. When I last saw him we had a lengthy discussion concerning warfarin versus new or anticoagulants.  He opted to switch to Xarelto and has been maintained on 15 mg daily based on his creatinine clearance.  He has tolerated this well without bleeding.  He was evaluated by Samara Snide the office on 12/27/2015 and had noticed an occasional irregularity of his heartbeat.  When she evaluated him, he was still in sinus rhythm without ectopy and there were no acute changes.  Presently continues to feel that his pulse has been stable.  He denies chest pain.  He denies presyncope or syncope.  When I saw him in follow-up, he was in sinus rhythm.  When I saw [name] 1 week ago for a follow-up cardiology evaluation, he was very stable.  He was denying any episodes of palpitations.  He is not required use of when necessary metoprolol.  His ECG revealed sinus bradycardia at 58 bpm.  He was unaware of any any heart rate irregularity. Dr. Shelia Media had checked his laboratory.  He denied any episodes of chest pain, presyncope or  syncope.   He developed recurrent atrial fibrillation with a rapid ventricular response and presented to [facility] ER on March 15.  He was given metoprolol 5 mg intravenously which brought his heart rate down.  He went home after several hours.  The following morning he was advised to increase amiodarone from 100 mg to 200 mg daily.  That night, again he develop recurrent AF with rapid ventricular response and repeat  presented to the emergency room on 08/16/2016.  At this time.  He was given metoprolol IV 2.5 mg 2.  He also was advised to take metoprolol 50 mg twice a day.  On this increased beta blocker dose, his blood pressure became low and he called and spoke with Rosaria Ferries several days later.  His medications were adjusted and he was told to stop amlodipine, reduce losartan to 25 mg, and reduce metoprolol to 25 mg twice a day.  When I last saw him 3 weeks ago, he was in atrial fibrillation with ventricular rate in the 90s.  I recommended further titration of amiodarone and increase this to 200 mg twice a day.  Also is on levothyroxine at 50 g.  TSH was normal at 3.38.  He has stage III chronic kidney disease.  He underwent a 2-D echo Doppler study on 09/04/2016 and was still in atrial fibrillation at that time.  Ejection fraction was 55-60%.  There was mild late systolic prolapse of both leaflets with mild MR.  There was mild PA pressure elevation at 39 mm.  Aortic root dimension measured 39 mm.  On the following evening, he began to notice his heart rate reducing down into the upper 40s to 50s and his rhythm appeared more regular. He called the office and was advised to reduce his metoprolol dose.  He also a completely discontinue this altogether.  He now notes his pulse typically in the 60s.  His blood pressure has risen to the 140s. He presents for reevaluation.  When I  saw him in April 2018, he was still in atrial fibrillation.  I recommended he continue amiodarone 200 mg twice a day at least for the next month.  At that time, I recommended he defer undergoing his YAG laser surgery procedure until his atrial fibrillation was controlled.  When he was last seen in May, he had converted to sinus rhythm and with cardioversion, he develops sinus bradycardia with rates in the upper 40s.  His metoprolol dose was reduced and ultimately discontinued.  Over the past several months, he has continued to feel well.  He is  unaware of any rhythm disturbance.  He denies chest pain or shortness of breath.  He states his blood pressure at home typically runs in the 130s to 140s and rarely in the 150s.  He presents for evaluation.  Past Medical History:  Diagnosis Date  . Adrenal mass ([place])   . Asthma   . BPH (benign prostatic hyperplasia)   . CKD (chronic kidney disease), stage III   . GERD (gastroesophageal reflux disease)   . H/O cardiovascular stress test 03/2012   EKG with 1-2 mm ST segment depression but normal perfusion images. Followup metastases test reduced exercise effort and functional status and determinate for myocardial dysfunction  . History of cardiac monitoring    a. 2015: cardiac monitor revealed episodes of PACs, bradycardia, with heart rates down to 48, 4 beat PSVT, 6 beat run of NSVT at a rate of 131. There was no recurrent afib.   Marland Kitchen Hyperlipidemia  LDL goal < 100   . Hypertension   . Hypothyroidism   . Macular degeneration   . MVP (mitral valve prolapse)    on cardiac cath - not mentioned on 2014 echo.  Marland Kitchen NSVT (nonsustained ventricular tachycardia) ([place])   . PAF (paroxysmal atrial fibrillation) ([place])    last episode 03/2012  . Pericarditis   . Premature atrial contractions   . Sinus bradycardia     Past Surgical History:  Procedure Laterality Date  . ABDOMINAL VASCULAR ULTRASOUND EVALUATION  12/20/2008   mild non-obstructive plaque, abdominal aorta, with mild calcification at the bifurcation-no obstruction, no evidence for aneurysm of the abdominal aorta  . CARDIAC CATHETERIZATION  01/04/1999   hyperdynamic LV function, probable angiographic mitral valve prolapse, systolic muscle bridging of the mid LAD without obstructive disease, systolic narrowing up to 62%  . Cardiopulmonary MET-test  07/20/2012   reduced exercise effort and functional status, peak max O2 comsumption was only 58% of predicted, cardiovascular status was interpreted as indeterminate for myocardial dysfunction due to the  suboptimal peak cardiovascular stress load.    Allergies  Allergen Reactions  . Ciprofloxacin Other (See Comments)    Cannot take due to currently taking Amiodarone  . Hytrin [Terazosin] Other (See Comments)    Syncopal event     Current Outpatient Prescriptions  Medication Sig Dispense Refill  . acetaminophen (TYLENOL 8 HOUR ARTHRITIS PAIN) 650 MG CR tablet Take 650 mg by mouth every 8 (eight) hours as needed for pain.    Marland Kitchen acetaminophen (TYLENOL) 500 MG tablet Take 500 mg by mouth every 6 (six) hours as needed for mild pain.    Marland Kitchen alfuzosin (UROXATRAL) 10 MG 24 hr tablet Take 10 mg by mouth daily.    Marland Kitchen amiodarone (PACERONE) 200 MG tablet Take 1.5 tablets (300 mg total) by mouth daily. 60 tablet 6  . Cholecalciferol (VITAMIN D-3) 1000 UNITS CAPS Take 1 capsule by mouth daily.     Marland Kitchen docusate sodium (COLACE) 100 MG capsule Take 100 mg by mouth daily.    . finasteride (PROSCAR) 5 MG tablet Take 5 mg by mouth daily.    . fish oil-omega-3 fatty acids 1000 MG capsule Take 1 g by mouth 2 (two) times daily.    . hydrochlorothiazide (HYDRODIURIL) 25 MG tablet Take 0.5 tablets (12.5 mg total) by mouth 2 (two) times a week. 10 tablet 3  . levothyroxine (SYNTHROID, LEVOTHROID) 50 MCG tablet Take 1 tablet by mouth daily.  2  . loratadine (CLARITIN) 10 MG tablet Take 10 mg by mouth daily as needed for allergies.    Marland Kitchen losartan (COZAAR) 25 MG tablet Take 25 mg by mouth 2 (two) times daily.     . magnesium hydroxide (MILK OF MAGNESIA) 800 MG/5ML suspension Take 5 mLs by mouth daily as needed for constipation.    . meclizine (ANTIVERT) 25 MG tablet Take 25 mg by mouth 3 (three) times daily as needed for dizziness.   2  . metoprolol tartrate (LOPRESSOR) 25 MG tablet Take 1 tablet as needed for palpitations 30 tablet 3  . Multiple Vitamins-Minerals (PRESERVISION AREDS) CAPS Take 1 capsule by mouth 2 (two) times daily. VITAMIN A FREE    . Polyethyl Glycol-Propyl Glycol (SYSTANE) 0.4-0.3 % SOLN Apply 2 drops  to eye daily as needed (for dry eeys).    . polyethylene glycol (MIRALAX / GLYCOLAX) packet Take 17 g by mouth daily as needed for mild constipation.     . ranitidine (ZANTAC) 150 MG tablet Take 150  mg by mouth daily as needed for heartburn.    . rosuvastatin (CRESTOR) 5 MG tablet TAKE 1 TABLET (5 MG TOTAL) BY MOUTH DAILY AT 6 PM. 30 tablet 11  . XARELTO 15 MG TABS tablet TAKE 1 TABLET (15 MG TOTAL) BY MOUTH DAILY WITH SUPPER. 30 tablet 7   No current facility-administered medications for this visit.     Social History   Social History  . Marital status: Married    Spouse name: N/A  . Number of children: N/A  . Years of education: N/A   Occupational History  . Not on file.   Social History Main Topics  . Smoking status: Never Smoker  . Smokeless tobacco: Never Used  . Alcohol use No  . Drug use: No  . Sexual activity: Not on file   Other Topics Concern  . Not on file   Social History Narrative  . No narrative on file    Social history is notable in that he is married. 2 children 4 grandchildren. No tobacco alcohol use.  ROS General: Negative; No fevers, chills, or night sweats;  HEENT: Negative; No changes in vision or hearing, sinus congestion, difficulty swallowing Pulmonary: Negative; No cough, wheezing, shortness of breath, hemoptysis Cardiovascular: Negative; No chest pain, presyncope, syncope, palpitations Edema has improved GI: Positive for GERD; No nausea, vomiting, diarrhea, or abdominal pain GU: Negative; No dysuria, hematuria, or difficulty voiding Musculoskeletal: Negative; no myalgias, joint pain, or weakness Hematologic/Oncology: Negative; no easy bruising, bleeding Endocrine: Negative; no heat/cold intolerance; no diabetes Neuro: Negative; no changes in balance, headaches Skin: Negative; No rashes or skin lesions Psychiatric: Negative; No behavioral problems, depression Sleep: Negative; No snoring, daytime sleepiness, hypersomnolence, bruxism, restless  legs, hypnogognic hallucinations, no cataplexy Other comprehensive 14 point system review is negative.   PE BP (!) 147/64   Pulse 64   Ht '5\' 8"'  (1.727 m)   Wt 154 lb 6.4 oz (70 kg)   BMI 23.48 kg/m    Repeat blood pressure by me was 142/70  Wt Readings from Last 3 Encounters:  02/04/17 154 lb 6.4 oz (70 kg)  01/15/17 154 lb 9.6 oz (70.1 kg)  10/21/16 161 lb 12.8 oz (73.4 kg)   General: Alert, oriented, no distress.  Skin: normal turgor, no rashes, warm and dry HEENT: Normocephalic, atraumatic. Pupils equal round and reactive to light; sclera anicteric; extraocular muscles intact;  Nose without nasal septal hypertrophy Mouth/Parynx benign; Mallinpatti scale 3 Neck: No JVD, no carotid bruits; normal carotid upstroke Lungs: clear to ausculatation and percussion; no wheezing or rales Chest wall: without tenderness to palpitation Heart: PMI not displaced, RRR, s1 s2 normal, 1/6 systolic murmur, no diastolic murmur, no rubs, gallops, thrills, or heaves Abdomen: soft, nontender; no hepatosplenomehaly, BS+; abdominal aorta nontender and not dilated by palpation. Back: no CVA tenderness Pulses 2+ Musculoskeletal: full range of motion, normal strength, no joint deformities Extremities: no clubbing cyanosis or edema, Homan's sign negative  Neurologic: grossly nonfocal; Cranial nerves grossly wnl Psychologic: Normal mood and affect  ECG (independently read by me): Normal sinus rhythm at 64 bpm.  QTc interval 455 ms.  Possible left atrial enlargement.  Nonspecific ST-T changes.  May 2018 ECG (independently read by me): Sinus bradycardia 59 bpm.  Possible left atrial enlargement.  No ST segment changes.  QTc interval 463 ms.  April 2018 ECG (independently read by me): Normal sinus rhythm at 69 bpm.  PACs, nonspecific ST changes.  Normal intervals.  08/20/2016 ECG (independently read by me): Atrial  fibrillation with ventricular rate at 96 bpm.  Nonspecific ST-T changes.  Lateral T-wave  changes.  08/13/2016 ECG (independently read by me): Sinus bradycardia 58 bpm.  Nonspecific ST changes.  Normal intervals.  September 2017 ECG (independently read by me): Sinus rhythm at 68.  Normal intervals.  No significant ST-T changes.  December 2016 ECG (independently read by me): Normal sinus rhythm at 61 bpm.  No ectopy.  QTc interval 434 ms.  June 2016 ECG (independently read by me): Sinus bradycardia 52 bpm.  QTc interval 407 ms.  PR interval 158 msec  December 2015 ECG (independently read by me): Sinus bradycardia at 49 bpm.  Nonspecific ST changes.  QTc interval 424 ms.  ECG (independently read by me): Sinus bradycardia 50 beats per minute.  Nonspecific ST changes.    Prior ECG: Sinus bradycardia at 50 beats per minute. No ectopy on ECG. Intervals normal  LABS: BMP Latest Ref Rng & Units 01/15/2017 10/07/2016 08/20/2016  Glucose 65 - 99 mg/dL 83 91 93  BUN 8 - 27 mg/dL 31(H) 23 30(H)  Creatinine 0.76 - 1.27 mg/dL 1.78(H) 1.68(H) 1.66(H)  BUN/Creat Ratio 10 - 24 17 - -  Sodium 134 - 144 mmol/L 137 141 140  Potassium 3.5 - 5.2 mmol/L 5.1 4.7 4.5  Chloride 96 - 106 mmol/L 95(L) 105 105  CO2 20 - 29 mmol/L '25 28 26  ' Calcium 8.6 - 10.2 mg/dL 9.1 8.9 8.8   Hepatic Function Latest Ref Rng & Units 12/28/2014 01/05/2014  Total Protein 6.1 - 8.1 g/dL 6.2 6.7  Albumin 3.6 - 5.1 g/dL 3.7 4.4  AST 10 - 35 U/L 17 15  ALT 9 - 46 U/L 15 15  Alk Phosphatase 40 - 115 U/L 117(H) 107  Total Bilirubin 0.2 - 1.2 mg/dL 0.6 0.4   CBC Latest Ref Rng & Units 08/15/2016 12/27/2015 12/28/2014  WBC 4.0 - 10.5 K/uL 5.6 5.0 4.7  Hemoglobin 13.0 - 17.0 g/dL 11.9(L) 13.2 13.3  Hematocrit 39.0 - 52.0 % 34.9(L) 38.6 39.5  Platelets 150 - 400 K/uL 156 158 173   Lab Results  Component Value Date   MCV 100.6 (H) 08/15/2016   MCV 100.5 (H) 12/27/2015   MCV 100.0 12/28/2014   Lab Results  Component Value Date   TSH 3.38 08/20/2016   Lipid Panel     Component Value Date/Time   CHOL 120 (L) 12/28/2014  0924   TRIG 85 12/28/2014 0924   HDL 49 12/28/2014 0924   CHOLHDL 2.4 12/28/2014 0924   VLDL 17 12/28/2014 0924   LDLCALC 54 12/28/2014 0924     RADIOLOGY: No results found.  IMPRESSION:  1. PAF (paroxysmal atrial fibrillation), recurrent 03/12/12   2. Essential hypertension   3. Mitral valve prolapse   4. Chronic anticoagulation   5. CKD (chronic kidney disease), stage III   6. Dyslipidemia   7. Medication management     ASSESSMENT AND PLAN: Ms. Blaker is a 76 year old gentleman who has a history of hypertension, hyperlipidemia, hypothyroidism, mitral valve prolapse, as well as paroxysmal atrial fibrillation. He had been maintaining sinus rhythm and was without recurrent atrial fibrillation until July 2017 when he developed recurrent atrial fibrillation with rapid ventricular response and was evaluated on 2 consecutive evenings in the emergency room where he was treated with IV metoprolol for rate slowing. He had developed recurrent atrial fibrillation earlier this year in April, reverted back to sinus rhythm.  He has bileaflet mild mitral valve prolapse with mild MR.  Left atrial  size is normal.  This continues to show normal systolic function.  He was still in AF at the time of echo procedure on 09/04/2016.  It appears that he converted back to sinus rhythm on either 4/5-6//2018 and when this occurred he developed bradycardia with rates in the upper 40s.  His metoprolol dose was reduced and ultimately discontinued.  His ECG today shows sinus rhythm with a ventricular rate at 64.  He continues to be on amiodarone 300 mg daily.  He has not been taking metoprolol but has this available on an as-needed basis for breakthrough arrhythmia.  His blood pressure at home has been remaining stable, although here in the office today was mildly elevated.  Reviewed recent laboratory.  He has stage III chronic kidney disease, and one-year ago, his creatinine was 1.49, in March 2018 1.48, May 2018 1.67, and  on 01/15/2017 was 1.78.  I have recommended that he decrease his hydrochlorothiazide which she has been taking everything other day to just 2 times per week depending upon swallowing, and blood pressure.  He will be having follow-up laboratory with Dr. Shelia Media.  He continues to be on Xarelto for anticoagulation at the 15 mg dose.  He is on low-dose Crestor and tolerating this well for hyperlipidemia.  I will see him in 4 months for cardiology evaluation.   Troy Sine, MD, [facility]  02/06/2017 9:25 PM

## 2017-02-04 NOTE — Patient Instructions (Signed)
Medication Instructions:  DECREASE HCTZ to 12.5mg  two times weekly.  Follow-Up: Your physician wants you to follow-up in: 4 MONTHS with Dr. Claiborne Billings.  You will receive a reminder letter in the mail two months in advance. If you don't receive a letter, please call our office to schedule the follow-up appointment.   Any Other Special Instructions Will Be Listed Below (If Applicable).     If you need a refill on your cardiac medications before your next appointment, please call your pharmacy.

## 2017-02-16 ENCOUNTER — Other Ambulatory Visit: Payer: Self-pay | Admitting: Cardiology

## 2017-02-17 NOTE — Telephone Encounter (Signed)
Rx(s) sent to pharmacy electronically.  

## 2017-02-18 ENCOUNTER — Other Ambulatory Visit (HOSPITAL_COMMUNITY): Payer: Medicare Other

## 2017-02-27 DIAGNOSIS — Z23 Encounter for immunization: Secondary | ICD-10-CM | POA: Diagnosis not present

## 2017-02-27 DIAGNOSIS — K5900 Constipation, unspecified: Secondary | ICD-10-CM | POA: Diagnosis not present

## 2017-02-27 DIAGNOSIS — E039 Hypothyroidism, unspecified: Secondary | ICD-10-CM | POA: Diagnosis not present

## 2017-02-27 DIAGNOSIS — R2681 Unsteadiness on feet: Secondary | ICD-10-CM | POA: Diagnosis not present

## 2017-02-28 DIAGNOSIS — I10 Essential (primary) hypertension: Secondary | ICD-10-CM | POA: Diagnosis not present

## 2017-02-28 DIAGNOSIS — D539 Nutritional anemia, unspecified: Secondary | ICD-10-CM | POA: Diagnosis not present

## 2017-02-28 DIAGNOSIS — E039 Hypothyroidism, unspecified: Secondary | ICD-10-CM | POA: Diagnosis not present

## 2017-02-28 DIAGNOSIS — K5900 Constipation, unspecified: Secondary | ICD-10-CM | POA: Diagnosis not present

## 2017-03-04 ENCOUNTER — Ambulatory Visit (INDEPENDENT_AMBULATORY_CARE_PROVIDER_SITE_OTHER): Payer: Medicare Other | Admitting: Gastroenterology

## 2017-03-04 ENCOUNTER — Encounter: Payer: Self-pay | Admitting: Gastroenterology

## 2017-03-04 VITALS — BP 142/80 | HR 68 | Ht 68.0 in | Wt 156.1 lb

## 2017-03-04 DIAGNOSIS — K5900 Constipation, unspecified: Secondary | ICD-10-CM | POA: Insufficient documentation

## 2017-03-04 DIAGNOSIS — K5909 Other constipation: Secondary | ICD-10-CM | POA: Diagnosis not present

## 2017-03-04 DIAGNOSIS — Z1211 Encounter for screening for malignant neoplasm of colon: Secondary | ICD-10-CM | POA: Insufficient documentation

## 2017-03-04 MED ORDER — LINACLOTIDE 72 MCG PO CAPS: 72.0000 ug | ORAL_CAPSULE | Freq: Every day | ORAL | 2 refills | Status: DC

## 2017-03-04 NOTE — Progress Notes (Signed)
I agree with the above note, plan 

## 2017-03-04 NOTE — Patient Instructions (Addendum)
If you are age 76 or older, your body mass index should be between 23-30. Your Body mass index is 23.73 kg/m. If this is out of the aforementioned range listed, please consider follow up with your Primary Care Provider.  If you are age 44 or younger, your body mass index should be between 19-25. Your Body mass index is 23.73 kg/m. If this is out of the aformentioned range listed, please consider follow up with your Primary Care Provider.   We have sent the following medications to your pharmacy for you to pick up at your convenience:  Linzess 72- you have also been given samples.  Your provider has ordered Cologuard testing as an option for colon cancer screening. This is performed by [employer] and may be out of network with your insurance. PRIOR to completing the test, it is YOUR responsibility to contact your insurance about covered benefits for this test. Your out of pocket expense could be anywhere from $0.00 to $649.00.   When you call to check coverage with your insurer, please provide the following information:   -The ONLY provider of Cologuard is [place] code for Cologuard is [phone].  [employer] NPI # [id number]  -[employer] Tax ID # [id number]   We have already sent your demographic and insurance information to [employer] (phone number [phone]) and they should contact you within the next week regarding your test. If you have not heard from them within the next week, please call our office at [phone].

## 2017-03-04 NOTE — Progress Notes (Addendum)
03/04/2017 Tanner Ho [id number] [birth year]   HISTORY OF PRESENT ILLNESS:  This is a pleasant 76 year old male who is new to our office. He has been referred here by his PCP, Dr. Shelia Media, for evaluation regarding chronic constipation. He tells me that he's had some constipation issues for several years. This seemed to worse recently, particularly after increasing his amiodarone dose for his atrial fibrillation. He did take MiraLAX, but admits he did not take it regularly. He was given samples of Linzess 72 g daily by his PCP and has been on that for the past 5 days. He says it does seem to be helping so far. He tells me his last colonoscopy was over 10 years ago by Dr. Lajoyce Corners and it was normal at that time. He tells me that he's had 2 normal colonoscopies in his life. He is asking about colorectal cancer screening including colonoscopy and alternatives. He denies family history of colon cancer. No rectal bleeding.  He is on Xarelto for atrial fibrillation and his cardiologist is Dr. Claiborne Billings.   Past Medical History:  Diagnosis Date  . Adrenal mass ([place])   . Asthma   . BPH (benign prostatic hyperplasia)   . CKD (chronic kidney disease), stage III ([place])   . GERD (gastroesophageal reflux disease)   . H/O cardiovascular stress test 03/2012   EKG with 1-2 mm ST segment depression but normal perfusion images. Followup metastases test reduced exercise effort and functional status and determinate for myocardial dysfunction  . History of cardiac monitoring    a. 2015: cardiac monitor revealed episodes of PACs, bradycardia, with heart rates down to 48, 4 beat PSVT, 6 beat run of NSVT at a rate of 131. There was no recurrent afib.   Marland Kitchen Hyperlipidemia LDL goal < 100   . Hypertension   . Hypothyroidism   . Macular degeneration   . MVP (mitral valve prolapse)    on cardiac cath - not mentioned on 2014 echo.  Marland Kitchen NSVT (nonsustained ventricular tachycardia) ([place])   . PAF (paroxysmal atrial fibrillation)  ([place])    last episode 03/2012  . Pericarditis   . Premature atrial contractions   . Sinus bradycardia    Past Surgical History:  Procedure Laterality Date  . ABDOMINAL VASCULAR ULTRASOUND EVALUATION  12/20/2008   mild non-obstructive plaque, abdominal aorta, with mild calcification at the bifurcation-no obstruction, no evidence for aneurysm of the abdominal aorta  . CARDIAC CATHETERIZATION  01/04/1999   hyperdynamic LV function, probable angiographic mitral valve prolapse, systolic muscle bridging of the mid LAD without obstructive disease, systolic narrowing up to 89%  . Cardiopulmonary MET-test  07/20/2012   reduced exercise effort and functional status, peak max O2 comsumption was only 58% of predicted, cardiovascular status was interpreted as indeterminate for myocardial dysfunction due to the suboptimal peak cardiovascular stress load.    reports that he has never smoked. He has never used smokeless tobacco. He reports that he does not drink alcohol or use drugs. family history includes Stroke in his mother and paternal grandfather. Allergies  Allergen Reactions  . Ciprofloxacin Other (See Comments)    Cannot take due to currently taking Amiodarone  . Hytrin [Terazosin] Other (See Comments)    Syncopal event       Outpatient Encounter Prescriptions as of 03/04/2017  Medication Sig  . acetaminophen (TYLENOL 8 HOUR ARTHRITIS PAIN) 650 MG CR tablet Take 650 mg by mouth every 8 (eight) hours as needed for pain.  Marland Kitchen acetaminophen (TYLENOL) 500  MG tablet Take 500 mg by mouth every 6 (six) hours as needed for mild pain.  Marland Kitchen alfuzosin (UROXATRAL) 10 MG 24 hr tablet Take 10 mg by mouth daily.  Marland Kitchen amiodarone (PACERONE) 200 MG tablet Take 1.5 tablets (300 mg total) by mouth daily.  . Cholecalciferol (VITAMIN D-3) 1000 UNITS CAPS Take 1 capsule by mouth daily.   . finasteride (PROSCAR) 5 MG tablet Take 5 mg by mouth daily.  . fish oil-omega-3 fatty acids 1000 MG capsule Take 1 g by mouth 2 (two)  times daily.  . hydrochlorothiazide (HYDRODIURIL) 25 MG tablet Take 0.5 tablets (12.5 mg total) by mouth 2 (two) times a week.  . levothyroxine (SYNTHROID, LEVOTHROID) 50 MCG tablet Take 75 tablets by mouth daily.   Marland Kitchen loratadine (CLARITIN) 10 MG tablet Take 10 mg by mouth daily as needed for allergies.  Marland Kitchen losartan (COZAAR) 25 MG tablet Take 25 mg by mouth 2 (two) times daily.   . magnesium hydroxide (MILK OF MAGNESIA) 800 MG/5ML suspension Take 5 mLs by mouth daily as needed for constipation.  . meclizine (ANTIVERT) 25 MG tablet Take 25 mg by mouth 3 (three) times daily as needed for dizziness.   . metoprolol tartrate (LOPRESSOR) 25 MG tablet Take 1 tablet as needed for palpitations  . Multiple Vitamins-Minerals (PRESERVISION AREDS) CAPS Take 1 capsule by mouth 2 (two) times daily. VITAMIN A FREE  . Polyethyl Glycol-Propyl Glycol (SYSTANE) 0.4-0.3 % SOLN Apply 2 drops to eye daily as needed (for dry eeys).  . polyethylene glycol (MIRALAX / GLYCOLAX) packet Take 17 g by mouth daily as needed for mild constipation.   . ranitidine (ZANTAC) 150 MG tablet Take 150 mg by mouth daily as needed for heartburn.  . rosuvastatin (CRESTOR) 5 MG tablet TAKE 1 TABLET (5 MG TOTAL) BY MOUTH DAILY AT 6 PM.  . XARELTO 15 MG TABS tablet TAKE 1 TABLET (15 MG TOTAL) BY MOUTH DAILY WITH SUPPER.  . [DISCONTINUED] docusate sodium (COLACE) 100 MG capsule Take 100 mg by mouth daily.  . [DISCONTINUED] losartan (COZAAR) 50 MG tablet Take 43m by mouth in the morning and 285mby mouth in the evening. (Patient not taking: Reported on 03/04/2017)   No facility-administered encounter medications on file as of 03/04/2017.      REVIEW OF SYSTEMS  : All other systems reviewed and negative except where noted in the History of Present Illness.   PHYSICAL EXAM: BP (!) 142/80   Pulse 68   Ht _0  (1.727 m)   Wt 156 lb 1.6 oz (70.8 kg)   SpO2 96%   BMI 23.73 kg/m  General: Well developed white male in no acute distress Head:  Normocephalic and atraumatic Eyes:  Sclerae anicteric, conjunctiva pink. Ears: Normal auditory acuity Lungs: Clear throughout to auscultation; no increased WOB. Heart: Regular rate and rhythm; no M/R/G. Abdomen: Soft, non-distended.  BS present.  Non-tender. Musculoskeletal: Symmetrical with no gross deformities  Skin: No lesions on visible extremities Extremities: No edema  Neurological: Alert oriented x 4, grossly non-focal. Psychological:  Alert and cooperative. 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 Seemed to worsen after increasing amiodarone dose. Taking Linzess 72 g daily with good results so far. He will continue this. -CRC screening:  Patient would like to do Cologuard instead.  I think that this is appropriate.  We will order this.  It was explained to him that this is not the standard of care and that  if this study is positive he will require colonoscopy. Patient expressed understanding.  **Records received:  Last colonoscopy was 11/2008 by Dr. Lajoyce Corners at which time he was found only have internal hemorrhoids.  CC:  [name], MD

## 2017-03-11 DIAGNOSIS — Z1211 Encounter for screening for malignant neoplasm of colon: Secondary | ICD-10-CM | POA: Diagnosis not present

## 2017-03-11 DIAGNOSIS — Z1212 Encounter for screening for malignant neoplasm of rectum: Secondary | ICD-10-CM | POA: Diagnosis not present

## 2017-03-25 ENCOUNTER — Other Ambulatory Visit: Payer: Self-pay

## 2017-03-25 LAB — COLOGUARD: COLOGUARD: NEGATIVE

## 2017-03-26 ENCOUNTER — Telehealth: Payer: Self-pay | Admitting: Cardiovascular Disease

## 2017-03-26 NOTE — Telephone Encounter (Signed)
Spoke with pt, he is just feeling weak. His heart is not racing and his bp is 143/58 with pulse 66. He is fine to wait for the appointment tomorrow and will call prior to appt if anything changes.

## 2017-03-26 NOTE — Telephone Encounter (Signed)
Please call,Ptt says he is not feeling good and is his heart is staying out of rhythm.He wants to see Dr Jannette Fogo made an appointment for tomorrow.

## 2017-03-27 ENCOUNTER — Ambulatory Visit (INDEPENDENT_AMBULATORY_CARE_PROVIDER_SITE_OTHER): Payer: Medicare Other | Admitting: Cardiovascular Disease

## 2017-03-27 ENCOUNTER — Encounter: Payer: Self-pay | Admitting: Cardiovascular Disease

## 2017-03-27 VITALS — BP 152/71 | HR 71 | Ht 68.0 in | Wt 153.8 lb

## 2017-03-27 DIAGNOSIS — I491 Atrial premature depolarization: Secondary | ICD-10-CM | POA: Diagnosis not present

## 2017-03-27 DIAGNOSIS — N183 Chronic kidney disease, stage 3 unspecified: Secondary | ICD-10-CM

## 2017-03-27 DIAGNOSIS — I10 Essential (primary) hypertension: Secondary | ICD-10-CM

## 2017-03-27 DIAGNOSIS — Z7901 Long term (current) use of anticoagulants: Secondary | ICD-10-CM | POA: Diagnosis not present

## 2017-03-27 DIAGNOSIS — I480 Paroxysmal atrial fibrillation: Secondary | ICD-10-CM | POA: Diagnosis not present

## 2017-03-27 DIAGNOSIS — I341 Nonrheumatic mitral (valve) prolapse: Secondary | ICD-10-CM

## 2017-03-27 DIAGNOSIS — R002 Palpitations: Secondary | ICD-10-CM

## 2017-03-27 MED ORDER — METOPROLOL TARTRATE 25 MG PO TABS: 12.5000 mg | ORAL_TABLET | Freq: Two times a day (BID) | ORAL | 3 refills | Status: DC

## 2017-03-27 NOTE — Patient Instructions (Signed)
Medication Instructions:  START metoprolol tartrate (Lopressor) 12.5 mg (1/2 tablet) two times daily.  Follow-Up: Your physician recommends that you schedule a follow-up appointment in: 6 weeks with Dr. Claiborne Billings.   Any Other Special Instructions Will Be Listed Below (If Applicable).     If you need a refill on your cardiac medications before your next appointment, please call your pharmacy.

## 2017-03-27 NOTE — Progress Notes (Signed)
Patient ID: Tanner Ho, male   DOB: [birth year], 76 y.o.   MRN: [id number]    Primary M.D.: Dr. [name]  HPI: Tanner Ho is a 76 y.o. male who presents to the office today for a 6 week follow-up cardiology evaluation.  Mr. Rineer has a history of mitral valve prolapse, hypertension, hyperlipidemia, hypothyroidism, as well as paroxysmal atrial fibrillation. He is on chronic Coumadin anticoagulation. His last documented atrial fibrillation episode was in October 2013. A nuclear perfusion study in October 2013 revealed normal perfusion imaging but he developed 1-2 mm of ST segment depression at peak stress test and the possibility of microvascular etiology leading to his ECG abnormalities. A cardiopulmonary met test on 09/17/2012 demonstarated a reduced peak maximum oxygen consumption at 58%. He had a suboptimal peak cardiovascular stress load making cardiovascular status interpretation indeterminate. He did have mild ventilation/perfusion mismatch suggesting impaired ulnar circulation plus minus increased dead space. He had a blunted chronotropic response to exercise. The test was limited by leg fatigue. When I saw him in the office subsequently I reduced his Cardizem from 240 to180 mg. At times, he notes a rare palpitation. He denies any awareness of breakthrough atrial fibrillation.   He presented to the emergency room in November 2014 with some vague chest pain. In retrospect, he feels this may have been GERD symptoms. Cardiac enzymes were negative. ECG was without changes. A nuclear perfusion study on 04/22/2013 was low risk and showed mild diaphragmatic attenuation but did not show a region of scar or ischemia, unchanged from his prior study of several years previously.  In 2015 he had noticed some dizziness and decreased energy   A cardiac monitor revealed episodes of PACs, bradycardia, with heart rates down to 48, but he also had a 6 beat run of wide-complex tachycardia at a rate of 131.   Remotely, he had developed breakthrough atrial fibrillation on amiodarone 100 mg and he has been on 100 mg, alternating with 200 mg daily.  On this most recent monitor, there were no episodes of recurrent AF.   He has been on amiodarone 100 mg daily, Norvasc 2.5 mg, losartan 50 mg bid, HCTZ 12.5 mg daily, Toprol-XL 12.5 mg daily, Crestor 5 mg and Coumadin.  He also has noticed rare episodes of vertigo for which he takes meclizine.    He also has been on Uroxatral to improve urinary flow. When I last saw him we had a lengthy discussion concerning warfarin versus new or anticoagulants.  He opted to switch to Xarelto and has been maintained on 15 mg daily based on his creatinine clearance.  He has tolerated this well without bleeding.  He was evaluated by Samara Snide the office on 12/27/2015 and had noticed an occasional irregularity of his heartbeat.  When she evaluated him, he was still in sinus rhythm without ectopy and there were no acute changes.  Presently continues to feel that his pulse has been stable.  He denies chest pain.  He denies presyncope or syncope.  When I saw him in follow-up, he was in sinus rhythm.  When I saw [name] 1 week ago for a follow-up cardiology evaluation, he was very stable.  He was denying any episodes of palpitations.  He is not required use of when necessary metoprolol.  His ECG revealed sinus bradycardia at 58 bpm.  He was unaware of any any heart rate irregularity. Dr. Shelia Media had checked his laboratory.  He denied any episodes of chest pain, presyncope or syncope.  He developed recurrent atrial fibrillation with a rapid ventricular response and presented to [facility] ER on March 15.  He was given metoprolol 5 mg intravenously which brought his heart rate down.  He went home after several hours.  The following morning he was advised to increase amiodarone from 100 mg to 200 mg daily.  That night, again he develop recurrent AF with rapid ventricular response and repeat  presented to the emergency room on 08/16/2016.  At this time.  He was given metoprolol IV 2.5 mg 2.  He also was advised to take metoprolol 50 mg twice a day.  On this increased beta blocker dose, his blood pressure became low and he called and spoke with Rosaria Ferries several days later.  His medications were adjusted and he was told to stop amlodipine, reduce losartan to 25 mg, and reduce metoprolol to 25 mg twice a day.  When I last saw him 3 weeks ago, he was in atrial fibrillation with ventricular rate in the 90s.  I recommended further titration of amiodarone and increase this to 200 mg twice a day.  Also is on levothyroxine at 50 g.  TSH was normal at 3.38.  He has stage III chronic kidney disease.  He underwent a 2-D echo Doppler study on 09/04/2016 and was still in atrial fibrillation at that time.  Ejection fraction was 55-60%.  There was mild late systolic prolapse of both leaflets with mild MR.  There was mild PA pressure elevation at 39 mm.  Aortic root dimension measured 39 mm.  On the following evening, he began to notice his heart rate reducing down into the upper 40s to 50s and his rhythm appeared more regular. He called the office and was advised to reduce his metoprolol dose.  He also a completely discontinue this altogether.  He now notes his pulse typically in the 60s.  His blood pressure has risen to the 140s. He presents for reevaluation.  When I  saw him in April 2018, he was still in atrial fibrillation.  I recommended he continue amiodarone 200 mg twice a day at least for the next month.  At that time, I recommended he defer undergoing his YAG laser surgery procedure until his atrial fibrillation was controlled.  When he was seen in May 2018 , he had converted to sinus rhythm and with  pharmacologic cardioversion, he develops sinus bradycardia with rates in the upper 40s.  His metoprolol dose was reduced and ultimately discontinued.  Over the past several months, he  continued to feel  well.   When I last saw him 6 weeks ago in early September 2018, he was in normal sinus rhythm at 64 bpm.  With a creatinine of 1.78.  I recommended that he decrease HCTZ to just 2 times per week depending upon his swelling and blood pressure.  He called the office yesterday complaining that he noticed his heart rate having some irregularity.  He was now working seen in the office for further evaluation.  He denies chest pain.  He denies presyncope or syncope.  He has been taking amiodarone 3 mg daily, HCTZ 12.5 mg 2 days per week, losartan 25 mg twice a day.  He has a prescription for when necessary metoprolol but has not been taking this.  Past Medical History:  Diagnosis Date  . Adrenal mass ([place])   . Asthma   . BPH (benign prostatic hyperplasia)   . CKD (chronic kidney disease), stage III ([place])   . GERD (  gastroesophageal reflux disease)   . H/O cardiovascular stress test 03/2012   EKG with 1-2 mm ST segment depression but normal perfusion images. Followup metastases test reduced exercise effort and functional status and determinate for myocardial dysfunction  . History of cardiac monitoring    a. 2015: cardiac monitor revealed episodes of PACs, bradycardia, with heart rates down to 48, 4 beat PSVT, 6 beat run of NSVT at a rate of 131. There was no recurrent afib.   Marland Kitchen Hyperlipidemia LDL goal < 100   . Hypertension   . Hypothyroidism   . Macular degeneration   . MVP (mitral valve prolapse)    on cardiac cath - not mentioned on 2014 echo.  Marland Kitchen NSVT (nonsustained ventricular tachycardia) ([place])   . PAF (paroxysmal atrial fibrillation) ([place])    last episode 03/2012  . Pericarditis   . Premature atrial contractions   . Sinus bradycardia     Past Surgical History:  Procedure Laterality Date  . ABDOMINAL VASCULAR ULTRASOUND EVALUATION  12/20/2008   mild non-obstructive plaque, abdominal aorta, with mild calcification at the bifurcation-no obstruction, no evidence for aneurysm of the abdominal  aorta  . CARDIAC CATHETERIZATION  01/04/1999   hyperdynamic LV function, probable angiographic mitral valve prolapse, systolic muscle bridging of the mid LAD without obstructive disease, systolic narrowing up to 47%  . Cardiopulmonary MET-test  07/20/2012   reduced exercise effort and functional status, peak max O2 comsumption was only 58% of predicted, cardiovascular status was interpreted as indeterminate for myocardial dysfunction due to the suboptimal peak cardiovascular stress load.    Allergies  Allergen Reactions  . Ciprofloxacin Other (See Comments)    Cannot take due to currently taking Amiodarone  . Hytrin [Terazosin] Other (See Comments)    Syncopal event     Current Outpatient Prescriptions  Medication Sig Dispense Refill  . acetaminophen (TYLENOL 8 HOUR ARTHRITIS PAIN) 650 MG CR tablet Take 650 mg by mouth every 8 (eight) hours as needed for pain.    Marland Kitchen acetaminophen (TYLENOL) 500 MG tablet Take 500 mg by mouth every 6 (six) hours as needed for mild pain.    Marland Kitchen alfuzosin (UROXATRAL) 10 MG 24 hr tablet Take 10 mg by mouth daily.    Marland Kitchen amiodarone (PACERONE) 200 MG tablet Take 1.5 tablets (300 mg total) by mouth daily. 60 tablet 6  . Cholecalciferol (VITAMIN D-3) 1000 UNITS CAPS Take 1 capsule by mouth daily.     . finasteride (PROSCAR) 5 MG tablet Take 5 mg by mouth daily.    . fish oil-omega-3 fatty acids 1000 MG capsule Take 1 g by mouth 2 (two) times daily.    . hydrochlorothiazide (HYDRODIURIL) 25 MG tablet Take 0.5 tablets (12.5 mg total) by mouth 2 (two) times a week. 10 tablet 3  . levothyroxine (SYNTHROID, LEVOTHROID) 50 MCG tablet Take 75 tablets by mouth daily.   2  . linaclotide (LINZESS) 72 MCG capsule Take 1 capsule (72 mcg total) by mouth daily before breakfast. 30 capsule 2  . loratadine (CLARITIN) 10 MG tablet Take 10 mg by mouth daily as needed for allergies.    Marland Kitchen losartan (COZAAR) 25 MG tablet Take 25 mg by mouth 2 (two) times daily.     . magnesium hydroxide (MILK  OF MAGNESIA) 800 MG/5ML suspension Take 5 mLs by mouth daily as needed for constipation.    . meclizine (ANTIVERT) 25 MG tablet Take 25 mg by mouth 3 (three) times daily as needed for dizziness.   2  .  metoprolol tartrate (LOPRESSOR) 25 MG tablet Take 0.5 tablets (12.5 mg total) by mouth 2 (two) times daily. 60 tablet 3  . Multiple Vitamins-Minerals (PRESERVISION AREDS) CAPS Take 1 capsule by mouth 2 (two) times daily. VITAMIN A FREE    . Polyethyl Glycol-Propyl Glycol (SYSTANE) 0.4-0.3 % SOLN Apply 2 drops to eye daily as needed (for dry eeys).    . polyethylene glycol (MIRALAX / GLYCOLAX) packet Take 17 g by mouth daily as needed for mild constipation.     . ranitidine (ZANTAC) 150 MG tablet Take 150 mg by mouth daily as needed for heartburn.    . rosuvastatin (CRESTOR) 5 MG tablet TAKE 1 TABLET (5 MG TOTAL) BY MOUTH DAILY AT 6 PM. 30 tablet 11  . XARELTO 15 MG TABS tablet TAKE 1 TABLET (15 MG TOTAL) BY MOUTH DAILY WITH SUPPER. 30 tablet 7   No current facility-administered medications for this visit.     Social History   Social History  . Marital status: Married    Spouse name: N/A  . Number of children: N/A  . Years of education: N/A   Occupational History  . Not on file.   Social History Main Topics  . Smoking status: Never Smoker  . Smokeless tobacco: Never Used  . Alcohol use No  . Drug use: No  . Sexual activity: Not on file   Other Topics Concern  . Not on file   Social History Narrative  . No narrative on file    Social history is notable in that he is married. 2 children 4 grandchildren. No tobacco alcohol use.  ROS General: Negative; No fevers, chills, or night sweats;  HEENT: Negative; No changes in vision or hearing, sinus congestion, difficulty swallowing Pulmonary: Negative; No cough, wheezing, shortness of breath, hemoptysis Cardiovascular: See history of present illness Edema has improved GI: Positive for GERD; No nausea, vomiting, diarrhea, or  abdominal pain GU: Negative; No dysuria, hematuria, or difficulty voiding Musculoskeletal: Negative; no myalgias, joint pain, or weakness Hematologic/Oncology: Negative; no easy bruising, bleeding Endocrine: Negative; no heat/cold intolerance; no diabetes Neuro: Negative; no changes in balance, headaches Skin: Negative; No rashes or skin lesions Psychiatric: Negative; No behavioral problems, depression Sleep: Negative; No snoring, daytime sleepiness, hypersomnolence, bruxism, restless legs, hypnogognic hallucinations, no cataplexy Other comprehensive 14 point system review is negative.   PE BP (!) 152/71   Pulse 71   Ht '5\' 8"'  (1.727 m)   Wt 153 lb 12.8 oz (69.8 kg)   BMI 23.39 kg/m    Repeat blood pressure by me was 148/70  Wt Readings from Last 3 Encounters:  03/27/17 153 lb 12.8 oz (69.8 kg)  03/04/17 156 lb 1.6 oz (70.8 kg)  02/04/17 154 lb 6.4 oz (70 kg)   General: Alert, oriented, no distress.  Skin: normal turgor, no rashes, warm and dry HEENT: Normocephalic, atraumatic. Pupils equal round and reactive to light; sclera anicteric; extraocular muscles intact;  Nose without nasal septal hypertrophy Mouth/Parynx benign; Mallinpatti scale 3 Neck: No JVD, no carotid bruits; normal carotid upstroke Lungs: clear to ausculatation and percussion; no wheezing or rales Chest wall: without tenderness to palpitation Heart: PMI not displaced, predominantly regular rhythm in the 70s with every 6 or 7 beat being a premature atrial beat., s1 s2 normal, 1/6 systolic murmur, no diastolic murmur, no rubs, gallops, thrills, or heaves Abdomen: Moderate diastases recti;soft, nontender; no hepatosplenomehaly, BS+; abdominal aorta nontender and not dilated by palpation. Back: no CVA tenderness Pulses 2+ Musculoskeletal: full range of  motion, normal strength, no joint deformities Extremities: no clubbing cyanosis or edema, Homan's sign negative  Neurologic: grossly nonfocal; Cranial nerves  grossly wnl Psychologic: Normal mood and affect   ECG (independently read by me): Normal sinus rhythm at 71 bpm, occasional PACs every sixth beat, nonspecific ST changes.  QTc interval 473 ms.  904/2018 ECG (independently read by me): Normal sinus rhythm at 64 bpm.  QTc interval 455 ms.  Possible left atrial enlargement.  Nonspecific ST-T changes.  May 2018 ECG (independently read by me): Sinus bradycardia 59 bpm.  Possible left atrial enlargement.  No ST segment changes.  QTc interval 463 ms.  April 2018 ECG (independently read by me): Normal sinus rhythm at 69 bpm.  PACs, nonspecific ST changes.  Normal intervals.  08/20/2016 ECG (independently read by me): Atrial fibrillation with ventricular rate at 96 bpm.  Nonspecific ST-T changes.  Lateral T-wave changes.  08/13/2016 ECG (independently read by me): Sinus bradycardia 58 bpm.  Nonspecific ST changes.  Normal intervals.  September 2017 ECG (independently read by me): Sinus rhythm at 68.  Normal intervals.  No significant ST-T changes.  December 2016 ECG (independently read by me): Normal sinus rhythm at 61 bpm.  No ectopy.  QTc interval 434 ms.  June 2016 ECG (independently read by me): Sinus bradycardia 52 bpm.  QTc interval 407 ms.  PR interval 158 msec  December 2015 ECG (independently read by me): Sinus bradycardia at 49 bpm.  Nonspecific ST changes.  QTc interval 424 ms.  ECG (independently read by me): Sinus bradycardia 50 beats per minute.  Nonspecific ST changes.    Prior ECG: Sinus bradycardia at 50 beats per minute. No ectopy on ECG. Intervals normal  LABS: BMP Latest Ref Rng & Units 01/15/2017 10/07/2016 08/20/2016  Glucose 65 - 99 mg/dL 83 91 93  BUN 8 - 27 mg/dL 31(H) 23 30(H)  Creatinine 0.76 - 1.27 mg/dL 1.78(H) 1.68(H) 1.66(H)  BUN/Creat Ratio 10 - 24 17 - -  Sodium 134 - 144 mmol/L 137 141 140  Potassium 3.5 - 5.2 mmol/L 5.1 4.7 4.5  Chloride 96 - 106 mmol/L 95(L) 105 105  CO2 20 - 29 mmol/L '25 28 26  ' Calcium  8.6 - 10.2 mg/dL 9.1 8.9 8.8   Hepatic Function Latest Ref Rng & Units 12/28/2014 01/05/2014  Total Protein 6.1 - 8.1 g/dL 6.2 6.7  Albumin 3.6 - 5.1 g/dL 3.7 4.4  AST 10 - 35 U/L 17 15  ALT 9 - 46 U/L 15 15  Alk Phosphatase 40 - 115 U/L 117(H) 107  Total Bilirubin 0.2 - 1.2 mg/dL 0.6 0.4   CBC Latest Ref Rng & Units 08/15/2016 12/27/2015 12/28/2014  WBC 4.0 - 10.5 K/uL 5.6 5.0 4.7  Hemoglobin 13.0 - 17.0 g/dL 11.9(L) 13.2 13.3  Hematocrit 39.0 - 52.0 % 34.9(L) 38.6 39.5  Platelets 150 - 400 K/uL 156 158 173   Lab Results  Component Value Date   MCV 100.6 (H) 08/15/2016   MCV 100.5 (H) 12/27/2015   MCV 100.0 12/28/2014   Lab Results  Component Value Date   TSH 3.38 08/20/2016   Lipid Panel     Component Value Date/Time   CHOL 120 (L) 12/28/2014 0924   TRIG 85 12/28/2014 0924   HDL 49 12/28/2014 0924   CHOLHDL 2.4 12/28/2014 0924   VLDL 17 12/28/2014 0924   LDLCALC 54 12/28/2014 0924     RADIOLOGY: No results found.  IMPRESSION:  1. Palpitations   2. PAF (paroxysmal atrial fibrillation),  recurrent 03/12/12   3. PAC (premature atrial contraction)   4. Mitral valve prolapse   5. Essential hypertension   6. Chronic anticoagulation   7. CKD (chronic kidney disease), stage III ([facility])     ASSESSMENT AND PLAN: Ms. Erbe is a 76 year old gentleman who has a history of hypertension, hyperlipidemia, hypothyroidism, mitral valve prolapse, as well as paroxysmal atrial fibrillation. He had been maintaining sinus rhythm and was without recurrent atrial fibrillation until July 2017 when he developed recurrent atrial fibrillation with rapid ventricular response and was evaluated on 2 consecutive evenings in the emergency room where he was treated with IV metoprolol for rate slowing. He developed recurrent atrial fibrillation in April 2018 and ultimately pharmacologically cardioverted back to sinus rhythm.  He has been documented to have mitral valve prolapse involving both leaflets of  his mitral valve, mild MR.  He has recently noticed some recurrent heart rate irregularity and was concerned about the possibility of recurrent AF.  His ECG today shows sinus rhythm at 71 bpm, although it seems like every 6-7 beat.  There is a premature atrial complex.  On auscultation.  This was appreciated as well.  This is the symptom which he has been experiencing.  With his blood pressure also being elevated, I have recommended initiation of low-dose metoprolol tartrate 12.5 mg twice a day.  He will monitor his heart rate and blood pressure. I will see him back in 6 weeks for reassessment.  At that time, I may elect to further titrate beta blocker and reduce amiodarone depending upon his heart rhythm, blood pressure and pulse.  If his blood pressure remains elevated further titration of losartan may be necessary.  He continues to be on Xarelto at 15 mg without bleeding.  He continues to tolerate rosuvastatin 5 mg for hyperlipidemia.  He is on levothyroxine at 75 g for hypothyroidism.  There is no edema on his reduced dose of HCTZ.  He has stage III chronic kidney disease.  Laboratory is checked by Dr. [name].  I will see him in 6 weeks for reevaluation.  Time spent: 25 minutes Troy Sine, MD, [facility]  03/27/2017 11:09 AM

## 2017-03-31 ENCOUNTER — Telehealth: Payer: Self-pay | Admitting: Cardiovascular Disease

## 2017-03-31 NOTE — Telephone Encounter (Signed)
SPOKE TO PATIENT. PATIENT STATES HE WOULD LIKE TALK TO PHARMACIST- RAQUEL IN REGARDS TO MEDICATION LEVOTHYROXINE PATIENT STATES HE HAS NOT BEEN FEELING WELL SINCE CHANGE IN MEDICATION FROM 50 MCG TO 87 MCG.  PATIENT AWARE WILL DEFER TO [facility] PHARMACIST

## 2017-03-31 NOTE — Telephone Encounter (Signed)
Tanner Ho is calling to speak with a nurse about his Amiodarone . It is affecting his Thyroid . Please call

## 2017-04-01 NOTE — Telephone Encounter (Signed)
Current therapy per Dr Evette Georges orders:  Metoprolol 12.5mg  BID Losartan 25mg  BID HCTZ 25mg  daily  Patient reports ADR of low HR with pulse 48s, increased fatigue and dizziness. Also reports BP elevated at 127 and 517G systolic.    Patient self adjusted metoprolol to 1/4 tab (~6.25mg ) twice daily but remains symptomatic with HR in the 40s  Noted dx of chronic CKD with baseline Scr 1.7-1.8.  Recommendations: 1. HOLD metoprolol for 72 hours (Wed/Thr & Fri) 2. Increase losartan to 50mg  every morning and 25mg  every evening x 72 hrs 3. On Saturday (Nov/3), decrease losartan back to 25mg  BID and start metoprolol at 12.5mg  every evening. 4. Monitor BP, pulse and symptoms twice daily 5. Call clinic back on Nov/10/2016 to discuss symptoms and further recommendations.   *Patient had questions related to thyroid problems and issues with pulse and palpitations*  All questions were answered and PCP is to adjust thyroid medication as needed.

## 2017-04-07 ENCOUNTER — Telehealth: Payer: Self-pay | Admitting: Pharmacist Clinician (PhC)/ Clinical Pharmacy Specialist

## 2017-04-07 ENCOUNTER — Other Ambulatory Visit: Payer: Self-pay | Admitting: Pharmacist Clinician (PhC)/ Clinical Pharmacy Specialist

## 2017-04-07 DIAGNOSIS — I10 Essential (primary) hypertension: Secondary | ICD-10-CM

## 2017-04-07 NOTE — Telephone Encounter (Signed)
Patient called today to report home BP/HR readings.  After speaking with Raquel last week he held metoprolol for 3 days then was to restart Saturday at 12. 5 mg at bedtime only.   For those 3 days he increased the losartan to 50 mg am and 25 mg hs, then decrease back to 25 mg bid.  Home BP readings Sat and Sun were consistently in the 909 systolic range with 8 readings averaging 157/60.  Unfortunately when he restarted the metoprolol, his heart rate Saturday evening dropped form 56 to 47.  He did not take any metoprolol after that.  Heart rate continues to be in the 48-52 range.    Advised patient to stop metoprolol unless he needs for palpitations.  He is to increase the losartan to 50 mg am and 25 mg pm for a week and in 1 week will have him come to the office to repeat BMET (SCr was 1.7).  At that time if we need to cut back on losartan will consider using amlodipine or hydralazine.    Patient and wife (both were on the phone) agreeable to plan.

## 2017-04-08 ENCOUNTER — Other Ambulatory Visit: Payer: Self-pay | Admitting: Cardiology

## 2017-04-14 DIAGNOSIS — Z7901 Long term (current) use of anticoagulants: Secondary | ICD-10-CM | POA: Diagnosis not present

## 2017-04-14 DIAGNOSIS — E7800 Pure hypercholesterolemia, unspecified: Secondary | ICD-10-CM | POA: Diagnosis not present

## 2017-04-14 DIAGNOSIS — I482 Chronic atrial fibrillation: Secondary | ICD-10-CM | POA: Diagnosis not present

## 2017-04-14 DIAGNOSIS — E559 Vitamin D deficiency, unspecified: Secondary | ICD-10-CM | POA: Diagnosis not present

## 2017-04-14 DIAGNOSIS — E039 Hypothyroidism, unspecified: Secondary | ICD-10-CM | POA: Diagnosis not present

## 2017-04-14 DIAGNOSIS — I10 Essential (primary) hypertension: Secondary | ICD-10-CM | POA: Diagnosis not present

## 2017-04-17 ENCOUNTER — Other Ambulatory Visit: Payer: Self-pay | Admitting: Cardiovascular Disease

## 2017-04-18 ENCOUNTER — Telehealth: Payer: Self-pay | Admitting: Cardiovascular Disease

## 2017-04-18 NOTE — Telephone Encounter (Signed)
New Message  Pt call requesting to speak with coumadin to f/u on telephone call from 11/5. Please call back to discuss

## 2017-04-18 NOTE — Telephone Encounter (Signed)
Returned the call to the patient. He stated that he was calling to speak with a pharmacist. Will route to pharmd for their knowledge.

## 2017-04-21 DIAGNOSIS — R748 Abnormal levels of other serum enzymes: Secondary | ICD-10-CM | POA: Diagnosis not present

## 2017-04-21 NOTE — Telephone Encounter (Signed)
Patient currently taking losartan 50 mg qam, 25 mg qhs and hctz 12.5 mg twice weekly.  Had BMET last week, SCr improved from 1.7 to 1.6.   Home BP readings for past week have also dropped somewhat, only 1 reading > 395 systolic and only 2 > 320.   Will have him continue with current regimen and patient aware to call should he have further concerns.

## 2017-04-23 ENCOUNTER — Ambulatory Visit (INDEPENDENT_AMBULATORY_CARE_PROVIDER_SITE_OTHER): Payer: Medicare Other | Admitting: Neurology

## 2017-04-23 ENCOUNTER — Encounter: Payer: Self-pay | Admitting: Neurology

## 2017-04-23 VITALS — BP 109/56 | HR 59 | Ht 69.0 in | Wt 155.0 lb

## 2017-04-23 DIAGNOSIS — R748 Abnormal levels of other serum enzymes: Secondary | ICD-10-CM | POA: Diagnosis not present

## 2017-04-23 DIAGNOSIS — R2689 Other abnormalities of gait and mobility: Secondary | ICD-10-CM

## 2017-04-23 DIAGNOSIS — R945 Abnormal results of liver function studies: Secondary | ICD-10-CM | POA: Diagnosis not present

## 2017-04-23 NOTE — Progress Notes (Signed)
Subjective:    Patient ID: Tanner Ho is a 76 y.o. male.  HPI     Star Age, MD, PhD [facility] [address], [address]O. [place], [place] [address]  Dear Dr. Shelia Media,   I saw your patient, Tanner Ho, upon your kind request in my neurologic clinic today for initial consultation of his gait disorder. The patient is accompanied by his wife today. As you know, Tanner Ho is a 77 year old right-handed gentleman with an underlying complex medical history of paroxysmal A. fib, on anticoagulation, hypertension, hyperlipidemia, chronic renal insufficiency, hypothyroidism, mitral valve prolapse, asthma, mild aortic insufficiency, macular degeneration with legal blindness, chronic constipation, history of bladder cancer s/p surgery only, history of pericarditis, history of adrenal mass, BPH with s/p TURP, history of cellulitis of the right leg and remote history of smoking, who reports an approximately ten-year history of gait difficulties. He feels that these have progressed with time. I reviewed your office note from 02/27/2017, which you kindly included.  He had a head CT without contrast on 10/18/2010 after a fall. I reviewed the results, IMPRESSION: Atrophy and small vessel disease.  No acute intracranial findings. No visible skull fracture or significant scalp hematoma. Thankfully, he has not had any recent falls. He uses a cane at the house. He has not had PT. he prefers to use his walking staff. He tries to stay active. He usually walks on the tractor around his church which has a gym. He used to be able to walk about 45 minutes and has reduced it to 30 minutes. He used to be enrolled with silver sneakers but currently only goes to the church gym to walk. He is tries to stay well-hydrated. He has no family history of Parkinson's disease or tremors or neurodegenerative conditions. He has not noticed any tremors. He feels he has less strength in his legs at  times.  His Past Medical History Is Significant For: Past Medical History:  Diagnosis Date  . Adrenal mass ([place])   . Asthma   . BPH (benign prostatic hyperplasia)   . CKD (chronic kidney disease), stage III ([place])   . GERD (gastroesophageal reflux disease)   . H/O cardiovascular stress test 03/2012   EKG with 1-2 mm ST segment depression but normal perfusion images. Followup metastases test reduced exercise effort and functional status and determinate for myocardial dysfunction  . History of cardiac monitoring    a. 2015: cardiac monitor revealed episodes of PACs, bradycardia, with heart rates down to 48, 4 beat PSVT, 6 beat run of NSVT at a rate of 131. There was no recurrent afib.   Marland Kitchen Hyperlipidemia LDL goal < 100   . Hypertension   . Hypothyroidism   . Macular degeneration   . MVP (mitral valve prolapse)    on cardiac cath - not mentioned on 2014 echo.  Marland Kitchen NSVT (nonsustained ventricular tachycardia) ([place])   . PAF (paroxysmal atrial fibrillation) ([place])    last episode 03/2012  . Pericarditis   . Premature atrial contractions   . Sinus bradycardia     Her Past Surgical History Is Significant For: Past Surgical History:  Procedure Laterality Date  . ABDOMINAL VASCULAR ULTRASOUND EVALUATION  12/20/2008   mild non-obstructive plaque, abdominal aorta, with mild calcification at the bifurcation-no obstruction, no evidence for aneurysm of the abdominal aorta  . CARDIAC CATHETERIZATION  01/04/1999   hyperdynamic LV function, probable angiographic mitral valve prolapse, systolic muscle bridging of the mid LAD without obstructive disease,  systolic narrowing up to 65%  . Cardiopulmonary MET-test  07/20/2012   reduced exercise effort and functional status, peak max O2 comsumption was only 58% of predicted, cardiovascular status was interpreted as indeterminate for myocardial dysfunction due to the suboptimal peak cardiovascular stress load.    His Family History Is Significant For: Family  History  Problem Relation Age of Onset  . Stroke Mother   . Stroke Paternal Grandfather     His Social History Is Significant For: Social History   Socioeconomic History  . Marital status: Married    Spouse name: None  . Number of children: None  . Years of education: None  . Highest education level: None  Social Needs  . Financial resource strain: None  . Food insecurity - worry: None  . Food insecurity - inability: None  . Transportation needs - medical: None  . Transportation needs - non-medical: None  Occupational History  . None  Tobacco Use  . Smoking status: Never Smoker  . Smokeless tobacco: Never Used  Substance and Sexual Activity  . Alcohol use: No  . Drug use: No  . Sexual activity: None  Other Topics Concern  . None  Social History Narrative  . None    His Allergies Are:  Allergies  Allergen Reactions  . Ciprofloxacin Other (See Comments)    Cannot take due to currently taking Amiodarone  . Hytrin [Terazosin] Other (See Comments)    Syncopal event   :   His Current Medications Are:  Outpatient Encounter Medications as of 04/23/2017  Medication Sig  . acetaminophen (TYLENOL 8 HOUR ARTHRITIS PAIN) 650 MG CR tablet Take 650 mg by mouth every 8 (eight) hours as needed for pain.  Marland Kitchen alfuzosin (UROXATRAL) 10 MG 24 hr tablet Take 10 mg by mouth daily.  Marland Kitchen amiodarone (PACERONE) 200 MG tablet Take 1.5 tablets (300 mg total) by mouth daily.  . Cholecalciferol (VITAMIN D-3) 1000 UNITS CAPS Take 1 capsule by mouth daily.   . finasteride (PROSCAR) 5 MG tablet Take 5 mg by mouth daily.  . fish oil-omega-3 fatty acids 1000 MG capsule Take 1 g by mouth 2 (two) times daily.  . hydrochlorothiazide (HYDRODIURIL) 25 MG tablet Take 0.5 tablets (12.5 mg total) by mouth 2 (two) times a week.  . levothyroxine (SYNTHROID, LEVOTHROID) 50 MCG tablet Take 75 mcg by mouth daily.   Marland Kitchen linaclotide (LINZESS) 72 MCG capsule Take 1 capsule (72 mcg total) by mouth daily before  breakfast.  . loratadine (CLARITIN) 10 MG tablet Take 10 mg by mouth daily as needed for allergies.  Marland Kitchen losartan (COZAAR) 25 MG tablet Take 75 mg by mouth daily.   Marland Kitchen losartan (COZAAR) 50 MG tablet TAKE 1 TABLET BY MOUTH TWICE A DAY (Patient taking differently: TAKE 1 TABLET BY MOUTH in the morning and 1/2 tablet in the afternoon.)  . magnesium hydroxide (MILK OF MAGNESIA) 800 MG/5ML suspension Take 5 mLs by mouth daily as needed for constipation.  . meclizine (ANTIVERT) 25 MG tablet Take 25 mg by mouth 3 (three) times daily as needed for dizziness.   . metoprolol tartrate (LOPRESSOR) 25 MG tablet Take 0.5 tablets (12.5 mg total) by mouth 2 (two) times daily.  . Multiple Vitamins-Minerals (PRESERVISION AREDS) CAPS Take 1 capsule by mouth 2 (two) times daily. VITAMIN A FREE  . Polyethyl Glycol-Propyl Glycol (SYSTANE) 0.4-0.3 % SOLN Apply 2 drops to eye daily as needed (for dry eeys).  . polyethylene glycol (MIRALAX / GLYCOLAX) packet Take 17 g by mouth  daily as needed for mild constipation.   . ranitidine (ZANTAC) 150 MG tablet Take 150 mg by mouth daily as needed for heartburn.  . rosuvastatin (CRESTOR) 5 MG tablet TAKE 1 TABLET (5 MG TOTAL) BY MOUTH DAILY AT 6 PM.  . XARELTO 15 MG TABS tablet TAKE 1 TABLET (15 MG TOTAL) BY MOUTH DAILY WITH SUPPER.  Marland Kitchen acetaminophen (TYLENOL) 500 MG tablet Take 500 mg by mouth every 6 (six) hours as needed for mild pain.  Marland Kitchen amiodarone (PACERONE) 200 MG tablet TAKE 1 TABLET (200 MG TOTAL) BY MOUTH 2 (TWO) TIMES DAILY. (Patient not taking: Reported on 04/23/2017)   No facility-administered encounter medications on file as of 04/23/2017.    Review of Systems:  Out of a complete 14 point review of systems, all are reviewed and negative with the exception of these symptoms as listed below:  Review of Systems  Neurological:       Pt presents today to discuss his gait disorder. Pt's gait problems started about 10 years ago but has progressively worsened. Pt denies  falls. Pt uses a cane at home.    Objective:  Neurological Exam  Physical Exam Physical Examination:   Vitals:   04/23/17 1011  BP: (!) 109/56  Pulse: (!) 59    General Examination: The patient is a very pleasant 76 y.o. male in no acute distress. He appears well-developed and well-nourished and well groomed.   HEENT: Normocephalic, atraumatic, pupils are equal, round and reactive to light and accommodation. He is status post bilateral cataract repairs. Extraocular tracking is  fair, no nystagmus noticed. Hearing is grossly intact. Face is symmetric with normal facial animation and normal facial sensation. Speech is clear with no dysarthria noted. There is no hypophonia. There is no lip, neck/head, jaw or voice tremor. Neck is supple with full range of passive and active motion. There are no carotid bruits on auscultation. Oropharynx exam reveals: no signif. mouth dryness, adequate dental hygiene. Tongue protrudes centrally and palate elevates symmetrically.   Chest: Clear to auscultation without wheezing, rhonchi or crackles noted.  Heart: S1+S2+0, regular and normal without murmurs, rubs or gallops noted.   Abdomen: Soft, non-tender and non-distended with normal bowel sounds appreciated on auscultation.  Extremities: There is trace pitting edema in the distal lower extremities bilaterally. Pedal pulses are intact.  Skin: Warm and dry without trophic changes noted.  Musculoskeletal: exam reveals no obvious joint deformities, tenderness or joint swelling or erythema.   Neurologically:  Mental status: The patient is awake, alert and oriented in all 4 spheres. His immediate and remote memory, attention, language skills and fund of knowledge are appropriate. There is no evidence of aphasia, agnosia, apraxia or anomia. Speech is clear with normal prosody and enunciation. Thought process is linear. Mood is normal and affect is normal.  Cranial nerves II - XII are as described above under  HEENT exam. In addition: shoulder shrug is normal with equal shoulder height noted. Motor exam: Normal bulk, strength and tone is noted. There is no drift, tremor or rebound. Romberg is not tested for safety reasons. Reflexes are 1+ throughout, except absent ankle jerks. Fine motor skills and coordination: intact with normal finger taps, normal hand movements, normal rapid alternating patting, normal foot taps and normal foot agility.  Cerebellar testing: No dysmetria or intention tremor on finger to nose testing. Heel to shin is unremarkable bilaterally. There is no truncal or gait ataxia.  Sensory exam: intact to light touch in the upper and lower extremities.  Gait, station and balance: He stands slowly, he stands slightly wide-based. He has a mild increase in lumbar kyphosis. He walks slowly and cautiously. He did not bring a cane today. He does not have a limp, no shuffling is noted. No magnetic gait. He turns in 3 steps, slowly and cautiously. Tandem walk is not tested for gait safety.  Assessment and Plan:   In summary, AVYAN LIVESAY is a very pleasant 76 y.o.-year old male with an underlying complex medical history of paroxysmal A. fib, on anticoagulation, hypertension, hyperlipidemia, chronic renal insufficiency, hypothyroidism, mitral valve prolapse, asthma, mild aortic insufficiency, macular degeneration with legal blindness, chronic constipation, history of bladder cancer s/p surgery only, history of pericarditis, history of adrenal mass, BPH with s/p TURP, history of cellulitis of the right leg and remote history of smoking, who presents for neurologic consultation for his gait disorder. On examination he has a nonspecific gait disorder most likely secondary to a combination of factors including normal aging, visual impairment, degenerative arthritis of neck and back, leg swelling, possible medication effects, and atherosclerotic changes to the brain. He had a head CT several years ago that  showed atrophy and white matter changes. I would like to proceed with a brain MRI without contrast to look for underlying structural cause for his gait disorder, such as extensive white matter changes age advanced cerebellar atrophy or lacunar strokes. He does have vascular risk factors. Thankfully, his exam is nonfocal and he does not have any evidence of parkinsonism. I reassured the patient and his wife in that regard. We talked about gait safety and healthy lifestyle in general. He is advised to try to exercise on a regular basis, stay well hydrated, well rested, he is advised to start using his cane more consistently. He is not quite ready to schedule an MRI. I did not order this yet. He is advised to think about it and let us know. At this juncture I can see him back on an as needed basis.  Thank you very much for allowing me to participate in the care of this nice patient. If I can be of any further assistance to you please do not hesitate to call me at [phone].  Sincerely,   Star Age, MD, PhD

## 2017-04-23 NOTE — Patient Instructions (Addendum)
I believe you have a nonspecific gait disorder, which likely is due to a combination of things: normal aging, visual impairment, degenerative arthritis of your neck and back, leg swelling, possible medication effects, and possible atherosclerosis of the blood vessels in your brain.   Remember to drink plenty of fluid, eat healthy meals and do not skip any meals. Try to eat protein with a every meal and eat a healthy snack such as fruit or nuts in between meals. Try to keep a regular sleep-wake schedule and try to exercise daily, particularly in the form of walking, 20-30 minutes a day, if you can. Change positions slowly and you consider using your cane.   As far as your medications are concerned, I would like to suggest no new medications.   As far as diagnostic testing: I would recommend a MRI brain, which would help look for an underlying structural issue with the brain.   As discussed, should you decide to go through with the MRI, please call us and I will order it.   I can see you back as needed. It was nice to meet you today.

## 2017-05-06 ENCOUNTER — Encounter: Payer: Self-pay | Admitting: Pharmacist Clinician (PhC)/ Clinical Pharmacy Specialist

## 2017-05-06 ENCOUNTER — Ambulatory Visit (INDEPENDENT_AMBULATORY_CARE_PROVIDER_SITE_OTHER): Payer: Medicare Other | Admitting: Pharmacist Clinician (PhC)/ Clinical Pharmacy Specialist

## 2017-05-06 VITALS — BP 136/58 | HR 64

## 2017-05-06 DIAGNOSIS — I10 Essential (primary) hypertension: Secondary | ICD-10-CM

## 2017-05-06 MED ORDER — CHLORTHALIDONE 25 MG PO TABS: ORAL_TABLET | ORAL | 3 refills | Status: DC

## 2017-05-06 NOTE — Progress Notes (Signed)
Patient ID: Tanner Ho                 DOB: March 07, 1941                      MRN: [id number]     HPI: Tanner Ho is a 76 y.o. male referred by Dr. Claiborne Billings to HTN clinic.  PMH includes mitral valve prolapse, hypertension, hyperlipidemia, hypothyroidism and atrial fibrillation.  We have made multiple changes to his medications over the past several months, and he is currently taking just losartan and HCTZ Patient reported increase in BP readings recently, by telephone, and the losartan dose was changed from 25mg  twice daily yo 50mg  every morning and  25mg  every evening. He is in the office today for follow up.   Patient denies problems with current therapy. Reports some minor light-headed upon standing but no falls, chest pain or increase fatigue.  His most recent metabolic panel showed a serum creatinine stable at 1.6.    Current HTN meds:  Losartan 50mg  every morning and 25mg  every evening HCTZ 12.5mg  every Tuesday and Saturday  Previously tried:  Losartan 50mg  qam 25 mg qhs HCTZ 12.5mg  daily Metoprolol - caused bradycardia Terazosin - caused syncopal event  BP goal: <130/80  Social History: denies alcohol and tobacco products  Diet: low sodium, mainly home cooked meals, occasional decaf coffee  Exercise: walking M-F for 30-45 minutes  Home BP readings: readings show large variability, mostly in the 789-381 systolic range, although does have some days that show more normal 120-130 range.   Suspect that the better days may be on days he takes HCTZ (only twice weekly) Arm cuff Omron intellisense - accurate with < 71mmHg difference from manual reading   Wt Readings from Last 3 Encounters:  04/23/17 155 lb (70.3 kg)  03/27/17 153 lb 12.8 oz (69.8 kg)  03/04/17 156 lb 1.6 oz (70.8 kg)   BP Readings from Last 3 Encounters:  05/06/17 (!) 136/58  04/23/17 (!) 109/56  03/27/17 (!) 152/71   Pulse Readings from Last 3 Encounters:  05/06/17 64  04/23/17 (!) 59  03/27/17 71     Past Medical History:  Diagnosis Date  . Adrenal mass ([place])   . Asthma   . BPH (benign prostatic hyperplasia)   . CKD (chronic kidney disease), stage III ([place])   . GERD (gastroesophageal reflux disease)   . H/O cardiovascular stress test 03/2012   EKG with 1-2 mm ST segment depression but normal perfusion images. Followup metastases test reduced exercise effort and functional status and determinate for myocardial dysfunction  . History of cardiac monitoring    a. 2015: cardiac monitor revealed episodes of PACs, bradycardia, with heart rates down to 48, 4 beat PSVT, 6 beat run of NSVT at a rate of 131. There was no recurrent afib.   Marland Kitchen Hyperlipidemia LDL goal < 100   . Hypertension   . Hypothyroidism   . Macular degeneration   . MVP (mitral valve prolapse)    on cardiac cath - not mentioned on 2014 echo.  Marland Kitchen NSVT (nonsustained ventricular tachycardia) ([place])   . PAF (paroxysmal atrial fibrillation) ([place])    last episode 03/2012  . Pericarditis   . Premature atrial contractions   . Sinus bradycardia     Current Outpatient Medications on File Prior to Visit  Medication Sig Dispense Refill  . acetaminophen (TYLENOL 8 HOUR ARTHRITIS PAIN) 650 MG CR tablet Take 650 mg by mouth every 8 (  eight) hours as needed for pain.    Marland Kitchen acetaminophen (TYLENOL) 500 MG tablet Take 500 mg by mouth every 6 (six) hours as needed for mild pain.    Marland Kitchen alfuzosin (UROXATRAL) 10 MG 24 hr tablet Take 10 mg by mouth daily.    Marland Kitchen amiodarone (PACERONE) 200 MG tablet Take 1.5 tablets (300 mg total) by mouth daily. 60 tablet 6  . amiodarone (PACERONE) 200 MG tablet TAKE 1 TABLET (200 MG TOTAL) BY MOUTH 2 (TWO) TIMES DAILY. (Patient not taking: Reported on 04/23/2017) 60 tablet 10  . Cholecalciferol (VITAMIN D-3) 1000 UNITS CAPS Take 1 capsule by mouth daily.     . finasteride (PROSCAR) 5 MG tablet Take 5 mg by mouth daily.    . fish oil-omega-3 fatty acids 1000 MG capsule Take 1 g by mouth 2 (two) times daily.    .  hydrochlorothiazide (HYDRODIURIL) 25 MG tablet Take 0.5 tablets (12.5 mg total) by mouth 2 (two) times a week. 10 tablet 3  . levothyroxine (SYNTHROID, LEVOTHROID) 50 MCG tablet Take 75 mcg by mouth daily.   2  . linaclotide (LINZESS) 72 MCG capsule Take 1 capsule (72 mcg total) by mouth daily before breakfast. 30 capsule 2  . loratadine (CLARITIN) 10 MG tablet Take 10 mg by mouth daily as needed for allergies.    Marland Kitchen losartan (COZAAR) 25 MG tablet Take 75 mg by mouth daily.     Marland Kitchen losartan (COZAAR) 50 MG tablet TAKE 1 TABLET BY MOUTH TWICE A DAY (Patient taking differently: TAKE 1 TABLET BY MOUTH in the morning and 1/2 tablet in the afternoon.) 60 tablet 8  . magnesium hydroxide (MILK OF MAGNESIA) 800 MG/5ML suspension Take 5 mLs by mouth daily as needed for constipation.    . meclizine (ANTIVERT) 25 MG tablet Take 25 mg by mouth 3 (three) times daily as needed for dizziness.   2  . metoprolol tartrate (LOPRESSOR) 25 MG tablet Take 0.5 tablets (12.5 mg total) by mouth 2 (two) times daily. 60 tablet 3  . Multiple Vitamins-Minerals (PRESERVISION AREDS) CAPS Take 1 capsule by mouth 2 (two) times daily. VITAMIN A FREE    . Polyethyl Glycol-Propyl Glycol (SYSTANE) 0.4-0.3 % SOLN Apply 2 drops to eye daily as needed (for dry eeys).    . polyethylene glycol (MIRALAX / GLYCOLAX) packet Take 17 g by mouth daily as needed for mild constipation.     . ranitidine (ZANTAC) 150 MG tablet Take 150 mg by mouth daily as needed for heartburn.    . rosuvastatin (CRESTOR) 5 MG tablet TAKE 1 TABLET (5 MG TOTAL) BY MOUTH DAILY AT 6 PM. 30 tablet 11  . XARELTO 15 MG TABS tablet TAKE 1 TABLET (15 MG TOTAL) BY MOUTH DAILY WITH SUPPER. 30 tablet 7   No current facility-administered medications on file prior to visit.     Allergies  Allergen Reactions  . Ciprofloxacin Other (See Comments)    Cannot take due to currently taking Amiodarone  . Hytrin [Terazosin] Other (See Comments)    Syncopal event     Blood pressure  (!) 136/58, pulse 64.  HTN (hypertension) Patient with hypertension, not controlled on current regimen.  Suspect that taking the short acting HCTZ only twice weekly is really of little benefit.   Will switch him to chlorthalidone 12.5 mg every other day and repeat BMET in 2 weeks.  He is to continue with home BP monitoring and return in a month for follow up.    Tommy Medal PharmD  CPP [place] [id number] 05/06/2017 7:04 PM

## 2017-05-06 NOTE — Patient Instructions (Signed)
Return for a a follow up appointment in 6 weeks  Your blood pressure today is 136/58   Check your blood pressure at home no more than twice daily and keep record of the readings.  Take your BP meds as follows:  Stop hydrochlorothiazide and start chlorthalidone 12.5 mg (1/2 tablet) every Monday, Wednesday and Friday  After 2 weeks on the new medication come to the lab and get your blood work drawn.  Bring all of your meds, your BP cuff and your record of home blood pressures to your next appointment.  Exercise as you're able, try to walk approximately 30 minutes per day.  Keep salt intake to a minimum, especially watch canned and prepared boxed foods.  Eat more fresh fruits and vegetables and fewer canned items.  Avoid eating in fast food restaurants.    HOW TO TAKE YOUR BLOOD PRESSURE: . Rest 5 minutes before taking your blood pressure. .  Don't smoke or drink caffeinated beverages for at least 30 minutes before. . Take your blood pressure before (not after) you eat. . Sit comfortably with your back supported and both feet on the floor (don't cross your legs). . Elevate your arm to heart level on a table or a desk. . Use the proper sized cuff. It should fit smoothly and snugly around your bare upper arm. There should be enough room to slip a fingertip under the cuff. The bottom edge of the cuff should be 1 inch above the crease of the elbow. . Ideally, take 3 measurements at one sitting and record the average.

## 2017-05-06 NOTE — Assessment & Plan Note (Signed)
Patient with hypertension, not controlled on current regimen.  Suspect that taking the short acting HCTZ only twice weekly is really of little benefit.   Will switch him to chlorthalidone 12.5 mg every other day and repeat BMET in 2 weeks.  He is to continue with home BP monitoring and return in a month for follow up.

## 2017-05-13 NOTE — Progress Notes (Signed)
Cardiology Office Note    Date:  05/14/2017   ID:  Tanner Ho, DOB Nov 14, 1940, MRN [id number]  PCP:  [name], MD  Cardiologist: Dr. Claiborne Billings  Chief Complaint  Patient presents with  . Follow-up    6 week visit    History of Present Illness:    Tanner Ho is a 76 y.o. male with past medical history of HTN, HLD, Hypothyroidism, and PAF (on Xarelto) who presents to the office today for 6-week follow-up.  He was last examined by Dr. Claiborne Billings in 03/2017 and reportred more frequent palpitations and denied any associated presyncope or syncope. EKG showed NSR, HR 71, with PAC's. He was restarted on Lopressor 12.68m BID and continued on Amiodarone 3061mdaily.   He called the office on 04/01/2017 reporting episodes of fatigue and dizziness with HR in the 40's at times and SBP elevated in the 160's-170's. He was instructed to hold Lopressor for 3 days and adjust his Losartan dosing for this time period. He restarted Lopressor at 12.78m51mn the evenings but experienced similar symptoms, therefore this was discontinued and changed to PRN dosing. Losartan was increased to 69m68m AM and 278mg22mPM.   He was evaluated by the HTN Clinic on 12/4 and BP was improved to 136/58. There was concern taking HCTZ only twice weekly was offering little benefit, therefore this was discontinued and he was started on Chlorthalidone 12.78mg e54my other day.   In talking with the patient today, he reports overall doing well with his recent medication changes.  He has been checking blood pressure regularly at home and this was initially in the 160's-170's/90's prior to starting Chlorthalidone but over the past few days, his systolic numbers have improved to the 14'0s.  BP is stable at 132/58 during today's visit.  He does experience occasional palpitations which only last for seconds at a time. He has not had to utilize his as needed Lopressor. He denies any recent chest discomfort, dyspnea, lightheadedness, or  presyncope. Does have occasional lower extremity edema.   Past Medical History:  Diagnosis Date  . Adrenal mass (HCC)  [place]   . BPH (benign prostatic hyperplasia)   . CKD (chronic kidney disease), stage III (HCC)  [place]D (gastroesophageal reflux disease)   . H/O cardiovascular stress test 03/2012   EKG with 1-2 mm ST segment depression but normal perfusion images. Followup metastases test reduced exercise effort and functional status and determinate for myocardial dysfunction  . History of cardiac monitoring    a. 2015: cardiac monitor revealed episodes of PACs, bradycardia, with heart rates down to 48, 4 beat PSVT, 6 beat run of NSVT at a rate of 131. There was no recurrent afib.   . HypeMarland Kitchenlipidemia LDL goal < 100   . Hypertension   . Hypothyroidism   . Macular degeneration   . MVP (mitral valve prolapse)    on cardiac cath - not mentioned on 2014 echo.  . NSVTMarland Kitchen(nonsustained ventricular tachycardia) (HCC)  [place] (paroxysmal atrial fibrillation) (HCC)  [place] 03[place]  . Pericarditis   . Premature atrial contractions   . Sinus bradycardia     Past Surgical History:  Procedure Laterality Date  . ABDOMINAL VASCULAR ULTRASOUND EVALUATION  12/20/2008   mild non-obstructive plaque, abdominal aorta, with mild calcification at the bifurcation-no obstruction, no evidence for aneurysm of the abdominal aorta  . CARDIAC CATHETERIZATION  01/04/1999   hyperdynamic LV function, probable angiographic mitral valve  prolapse, systolic muscle bridging of the mid LAD without obstructive disease, systolic narrowing up to 16%  . Cardiopulmonary MET-test  07/20/2012   reduced exercise effort and functional status, peak max O2 comsumption was only 58% of predicted, cardiovascular status was interpreted as indeterminate for myocardial dysfunction due to the suboptimal peak cardiovascular stress load.    Current Medications: Outpatient Medications Prior to Visit  Medication Sig Dispense Refill   . acetaminophen (TYLENOL 8 HOUR ARTHRITIS PAIN) 650 MG CR tablet Take 650 mg by mouth every 8 (eight) hours as needed for pain.    Marland Kitchen acetaminophen (TYLENOL) 500 MG tablet Take 500 mg by mouth every 6 (six) hours as needed for mild pain.    Marland Kitchen alfuzosin (UROXATRAL) 10 MG 24 hr tablet Take 10 mg by mouth daily.    Marland Kitchen amiodarone (PACERONE) 200 MG tablet Take 200 mg by mouth daily.    . chlorthalidone (HYGROTON) 25 MG tablet Take 1/2 tablet by mouth every other day or as directed 30 tablet 3  . Cholecalciferol (VITAMIN D-3) 1000 UNITS CAPS Take 1 capsule by mouth daily.     . finasteride (PROSCAR) 5 MG tablet Take 5 mg by mouth daily.    . fish oil-omega-3 fatty acids 1000 MG capsule Take 1 g by mouth 2 (two) times daily.    . Levothyroxine Sodium 75 MCG CAPS Take 75 mcg by mouth daily.   2  . linaclotide (LINZESS) 72 MCG capsule Take 1 capsule (72 mcg total) by mouth daily before breakfast. 30 capsule 2  . loratadine (CLARITIN) 10 MG tablet Take 10 mg by mouth daily as needed for allergies.    Marland Kitchen losartan (COZAAR) 25 MG tablet Take 75 mg by mouth daily.     Marland Kitchen losartan (COZAAR) 50 MG tablet TAKE 1 TABLET BY MOUTH TWICE A DAY (Patient taking differently: TAKE 1 TABLET BY MOUTH in the morning and 1/2 tablet in the afternoon.) 60 tablet 8  . meclizine (ANTIVERT) 25 MG tablet Take 25 mg by mouth 3 (three) times daily as needed for dizziness.   2  . metoprolol tartrate (LOPRESSOR) 25 MG tablet Take 0.5 tablets (12.5 mg total) by mouth 2 (two) times daily. 60 tablet 3  . Multiple Vitamins-Minerals (PRESERVISION AREDS) CAPS Take 1 capsule by mouth 2 (two) times daily. VITAMIN A FREE    . Polyethyl Glycol-Propyl Glycol (SYSTANE) 0.4-0.3 % SOLN Apply 2 drops to eye daily as needed (for dry eeys).    . rosuvastatin (CRESTOR) 5 MG tablet TAKE 1 TABLET (5 MG TOTAL) BY MOUTH DAILY AT 6 PM. 30 tablet 11  . XARELTO 15 MG TABS tablet TAKE 1 TABLET (15 MG TOTAL) BY MOUTH DAILY WITH SUPPER. 30 tablet 7  . amiodarone  (PACERONE) 200 MG tablet Take 1.5 tablets (300 mg total) by mouth daily. (Patient taking differently: Take 200 mg by mouth daily. ) 60 tablet 6  . amiodarone (PACERONE) 200 MG tablet TAKE 1 TABLET (200 MG TOTAL) BY MOUTH 2 (TWO) TIMES DAILY. (Patient not taking: Reported on 05/14/2017) 60 tablet 10  . hydrochlorothiazide (HYDRODIURIL) 25 MG tablet Take 0.5 tablets (12.5 mg total) by mouth 2 (two) times a week. (Patient not taking: Reported on 05/14/2017) 10 tablet 3  . magnesium hydroxide (MILK OF MAGNESIA) 800 MG/5ML suspension Take 5 mLs by mouth daily as needed for constipation.    . polyethylene glycol (MIRALAX / GLYCOLAX) packet Take 17 g by mouth daily as needed for mild constipation.     . ranitidine (  ZANTAC) 150 MG tablet Take 150 mg by mouth daily as needed for heartburn.     No facility-administered medications prior to visit.      Allergies:   Ciprofloxacin and Hytrin [terazosin]   Social History   Socioeconomic History  . Marital status: Married    Spouse name: None  . Number of children: None  . Years of education: None  . Highest education level: None  Social Needs  . Financial resource strain: None  . Food insecurity - worry: None  . Food insecurity - inability: None  . Transportation needs - medical: None  . Transportation needs - non-medical: None  Occupational History  . None  Tobacco Use  . Smoking status: Never Smoker  . Smokeless tobacco: Never Used  Substance and Sexual Activity  . Alcohol use: No  . Drug use: No  . Sexual activity: None  Other Topics Concern  . None  Social History Narrative  . None     Family History:  The patient's family history includes Stroke in his mother and paternal grandfather.   Review of Systems:   Please see the history of present illness.     General:  No chills, fever, night sweats or weight changes.  Cardiovascular:  No chest pain, dyspnea on exertion, edema, orthopnea, palpitations, paroxysmal nocturnal  dyspnea. Dermatological: No rash, lesions/masses Respiratory: No cough, dyspnea Urologic: No hematuria, dysuria Abdominal:   No nausea, vomiting, diarrhea, bright red blood per rectum, melena, or hematemesis Neurologic:  No visual changes, wkns, changes in mental status. All other systems reviewed and are otherwise negative except as noted above.   Physical Exam:    VS:  BP (!) 132/58   Pulse 66   Ht '5\' 9"'  (1.753 m)   Wt 152 lb (68.9 kg)   BMI 22.45 kg/m    General: Well developed, well nourished Caucasian male appearing in no acute distress. Head: Normocephalic, atraumatic, sclera non-icteric, no xanthomas, nares are without discharge.  Neck: No carotid bruits. JVD not elevated.  Lungs: Respirations regular and unlabored, without wheezes or rales.  Heart: Regular rate and rhythm. No S3 or S4.  No murmur, no rubs, or gallops appreciated. Abdomen: Soft, non-tender, non-distended with normoactive bowel sounds. No hepatomegaly. No rebound/guarding. No obvious abdominal masses. Msk:  Strength and tone appear normal for age. No joint deformities or effusions. Extremities: No clubbing or cyanosis. Trace bilateral lower extremity edema.  Distal pedal pulses are 2+ bilaterally. Neuro: Alert and oriented X 3. Moves all extremities spontaneously. No focal deficits noted. Psych:  Responds to questions appropriately with a normal affect. Skin: No rashes or lesions noted  Wt Readings from Last 3 Encounters:  05/14/17 152 lb (68.9 kg)  04/23/17 155 lb (70.3 kg)  03/27/17 153 lb 12.8 oz (69.8 kg)     Studies/Labs Reviewed:   EKG:  EKG is not ordered today.   Recent Labs: 08/15/2016: Hemoglobin 11.9; Platelets 156 08/20/2016: Magnesium 2.1; TSH 3.38 01/15/2017: BUN 31; Creatinine, Ser 1.78; Potassium 5.1; Sodium 137   Lipid Panel    Component Value Date/Time   CHOL 120 (L) 12/28/2014 0924   TRIG 85 12/28/2014 0924   HDL 49 12/28/2014 0924   CHOLHDL 2.4 12/28/2014 0924   VLDL 17  12/28/2014 0924   LDLCALC 54 12/28/2014 0924    Additional studies/ records that were reviewed today include:   Echocardiogram: 09/2016 Study Conclusions  - Left ventricle: The cavity size was normal. Wall thickness was   increased in a pattern  of moderate LVH. Systolic function was   normal. The estimated ejection fraction was in the range of 55%   to 60%. Wall motion was normal; there were no regional wall   motion abnormalities. The study is not technically sufficient to   allow evaluation of LV diastolic function. - Aortic valve: Trileaflet; mildly thickened leaflets. There was   mild regurgitation. - Aorta: Aortic root dimension: 39 mm (ED). - Aortic root: The aortic root is mildly dilated. - Mitral valve: Mild late systolic bileaflet prolapse. There is   mild regurgitation. Mildly thickened leaflets . - Left atrium: The atrium was normal in size. - Atrial septum: No defect or patent foramen ovale was identified. - Tricuspid valve: There was trivial regurgitation. - Pulmonary arteries: PA peak pressure: 39 mm Hg (S). - Inferior vena cava: The vessel was normal in size. The   respirophasic diameter changes were in the normal range (= 50%),   consistent with normal central venous pressure.  Impressions:  - Compared to a prior study in 2014, the LVEF is unchanged. The   aortic root is mildly dilated at 3.9 cm. There is mild pulmonary   hypertension with an RVSP of 39 mmHg.  Assessment:    1. Paroxysmal atrial fibrillation (HCC)   2. Chronic anticoagulation   3. Essential hypertension   4. Dyslipidemia   5. CKD (chronic kidney disease), stage III ([place])      Plan:   In order of problems listed above:  1. Paroxysmal Atrial Fibrillation/ Chronic Anticoagulation - he reports having intermittent palpitations only lasting for seconds at a time, most consistent with PAC's or PVC's. No persistent palpitations. Intolerant to scheduled Lopressor due to dizziness and  bradycardia, therefore is now using this PRN.  - he has been on Amiodarone 384m daily since the Spring of this year. With no recurrent persistent symptoms, will reduce Amiodarone dosing to 2068mdaily. Scheduled for repeat LFT's and TSH in 06/2017. - he denies any evidence of active bleeding. Continue Xarelto for anticoagulation.   2. HTN - BP is well-controlled at 132/58. Is being followed by the HTN Clinic and HCTZ was recently switched to Chlorthalidone 12.55m33mWF. Will recheck BMET next week. If kidney function remains stable and BP is not at goal, can consider 12.55mg24mily dosing. Continue Losartan at 755mg36mly.   3. HLD - followed by PCP. Lipid Panel in 09/2016 showed total cholesterol of 131, HDL 70, and LDL 53. - continue Crestor 55mg d44my.   4. Stage 3 CKD - baseline creatinine 1.6 - 1.7. Stable at 1.6 in 04/2017. - plan for repeat BMET next week due to recent initiation of Chlorthalidone.    Medication Adjustments/Labs and Tests Ordered: Current medicines are reviewed at length with the patient today.  Concerns regarding medicines are outlined above.  Medication changes, Labs and Tests ordered today are listed in the Patient Instructions below. Patient Instructions  Medication Instructions:  DECREASE- Amiodarone 200 mg daily  If you need a refill on your cardiac medications before your next appointment, please call your pharmacy.  Labwork: Please get BMP next week HERE IN OUR OFFICE AT [facility]  Take the provided lab slips for you to take with you to the lab for you blood draw.   You will NOT need to fast   You may go to any [facility] lab that is convenient for you however, we do have a lab in our office that is able to assist you. You do NOT need an appointment for  our lab. Once in our office lobby there is a podium to the right of the check-in desk where you are to sign-in and ring a doorbell to alert [place] you are here. Lab is open Monday-Friday from 8:00am to 4:00pm; and is  closed for lunch from 12:45p-1:45pm   Testing/Procedures: None Ordered  Follow-Up: Your physician wants you to follow-up in: Keep appointment on January 15th @ 9:00 am.   Thank you for choosing [facility] [employer], Erma Heritage, PA-C  05/14/2017 12:23 PM    [facility] [address] [place], [place]  [address] Phone: [phone]; Fax: [phone]  [address] [place], [place] [address] Phone: [phone]

## 2017-05-14 ENCOUNTER — Ambulatory Visit (INDEPENDENT_AMBULATORY_CARE_PROVIDER_SITE_OTHER): Payer: Medicare Other | Admitting: Student

## 2017-05-14 ENCOUNTER — Encounter: Payer: Self-pay | Admitting: Student

## 2017-05-14 VITALS — BP 132/58 | HR 66 | Ht 69.0 in | Wt 152.0 lb

## 2017-05-14 DIAGNOSIS — N183 Chronic kidney disease, stage 3 unspecified: Secondary | ICD-10-CM

## 2017-05-14 DIAGNOSIS — I10 Essential (primary) hypertension: Secondary | ICD-10-CM | POA: Diagnosis not present

## 2017-05-14 DIAGNOSIS — Z7901 Long term (current) use of anticoagulants: Secondary | ICD-10-CM

## 2017-05-14 DIAGNOSIS — E785 Hyperlipidemia, unspecified: Secondary | ICD-10-CM

## 2017-05-14 DIAGNOSIS — I480 Paroxysmal atrial fibrillation: Secondary | ICD-10-CM

## 2017-05-14 NOTE — Patient Instructions (Signed)
Medication Instructions:  DECREASE- Amiodarone 200 mg daily  If you need a refill on your cardiac medications before your next appointment, please call your pharmacy.  Labwork: Please get BMP next week HERE IN OUR OFFICE AT [facility]  Take the provided lab slips for you to take with you to the lab for you blood draw.   You will NOT need to fast   You may go to any [facility] lab that is convenient for you however, we do have a lab in our office that is able to assist you. You do NOT need an appointment for our lab. Once in our office lobby there is a podium to the right of the check-in desk where you are to sign-in and ring a doorbell to alert [place] you are here. Lab is open Monday-Friday from 8:00am to 4:00pm; and is closed for lunch from 12:45p-1:45pm   Testing/Procedures: None Ordered  Follow-Up: Your physician wants you to follow-up in: Keep appointment on January 15th @ 9:00 am.     Thank you for choosing [facility]!!

## 2017-06-02 DIAGNOSIS — K219 Gastro-esophageal reflux disease without esophagitis: Secondary | ICD-10-CM | POA: Insufficient documentation

## 2017-06-02 DIAGNOSIS — H3530 Unspecified macular degeneration: Secondary | ICD-10-CM | POA: Insufficient documentation

## 2017-06-02 DIAGNOSIS — I341 Nonrheumatic mitral (valve) prolapse: Secondary | ICD-10-CM | POA: Insufficient documentation

## 2017-06-02 DIAGNOSIS — N400 Enlarged prostate without lower urinary tract symptoms: Secondary | ICD-10-CM | POA: Insufficient documentation

## 2017-06-02 DIAGNOSIS — E278 Other specified disorders of adrenal gland: Secondary | ICD-10-CM | POA: Insufficient documentation

## 2017-06-02 DIAGNOSIS — N183 Chronic kidney disease, stage 3 unspecified: Secondary | ICD-10-CM | POA: Insufficient documentation

## 2017-06-02 DIAGNOSIS — Z9289 Personal history of other medical treatment: Secondary | ICD-10-CM | POA: Insufficient documentation

## 2017-06-02 DIAGNOSIS — E039 Hypothyroidism, unspecified: Secondary | ICD-10-CM | POA: Insufficient documentation

## 2017-06-02 DIAGNOSIS — I10 Essential (primary) hypertension: Secondary | ICD-10-CM | POA: Insufficient documentation

## 2017-06-02 DIAGNOSIS — J45909 Unspecified asthma, uncomplicated: Secondary | ICD-10-CM | POA: Insufficient documentation

## 2017-06-02 DIAGNOSIS — I319 Disease of pericardium, unspecified: Secondary | ICD-10-CM | POA: Insufficient documentation

## 2017-06-02 DIAGNOSIS — I491 Atrial premature depolarization: Secondary | ICD-10-CM | POA: Insufficient documentation

## 2017-06-11 DIAGNOSIS — E785 Hyperlipidemia, unspecified: Secondary | ICD-10-CM | POA: Diagnosis not present

## 2017-06-12 DIAGNOSIS — I10 Essential (primary) hypertension: Secondary | ICD-10-CM | POA: Diagnosis not present

## 2017-06-12 LAB — BASIC METABOLIC PANEL
BUN / CREAT RATIO: 16 (ref 10–24)
BUN: 21 mg/dL (ref 8–27)
CO2: 25 mmol/L (ref 20–29)
Calcium: 9.3 mg/dL (ref 8.6–10.2)
Chloride: 98 mmol/L (ref 96–106)
Creatinine, Ser: 1.32 mg/dL — ABNORMAL HIGH (ref 0.76–1.27)
GFR calc Af Amer: 60 mL/min/{1.73_m2} (ref >59)
GFR, EST NON AFRICAN AMERICAN: 52 mL/min/{1.73_m2} — AB (ref >59)
GLUCOSE: 91 mg/dL (ref 65–99)
POTASSIUM: 4.4 mmol/L (ref 3.5–5.2)
SODIUM: 136 mmol/L (ref 134–144)

## 2017-06-17 ENCOUNTER — Encounter: Payer: Self-pay | Admitting: Pharmacist

## 2017-06-17 ENCOUNTER — Ambulatory Visit (INDEPENDENT_AMBULATORY_CARE_PROVIDER_SITE_OTHER): Payer: Medicare Other | Admitting: Pharmacist

## 2017-06-17 VITALS — BP 122/54 | HR 58

## 2017-06-17 DIAGNOSIS — I10 Essential (primary) hypertension: Secondary | ICD-10-CM

## 2017-06-17 DIAGNOSIS — J019 Acute sinusitis, unspecified: Secondary | ICD-10-CM | POA: Diagnosis not present

## 2017-06-17 NOTE — Assessment & Plan Note (Signed)
Blood pressure remains well controlled since last medication adjustment on 05/06/2017. No problems noted with current therapy and improved renal function also noted. Will continue current therapy as previously prescribed, low sodium diet,  and follow up as with HTN clinic and cardiologist.

## 2017-06-17 NOTE — Progress Notes (Signed)
Patient ID: Tanner Ho                 DOB: 1940/06/30                      MRN: [id number]     HPI:  Tanner Ho is a 77 y.o. male referred by Dr. Claiborne Billings to HTN clinic. PMH includes mitral valve prolapse, hypertension, hyperlipidemia, hypothyroidism and atrial fibrillation.  We have made multiple changes to his medications over the past several months, and he is currently taking just losartan and chlorthalidone.  Patient denies problems with current therapy. Most recent metabolic panel showes improved renal function with Scr = 1.32 and all electrolytes within normal limits.   Current HTN meds:  Losartan 50mg  every morning and 25mg  every evening Chlorthalidone 12.5mg  MWF  Previously tried:  Losartan 50mg  qam 25 mg qhs HCTZ 12.5mg  daily Metoprolol - caused bradycardia Terazosin - caused syncopal event  BP goal: <130/80  Social History: denies alcohol and tobacco products  Diet: low sodium, mainly home cooked meals, occasional decaf coffee  Exercise: walking M-F for 30-45 minutes  Home BP readings: 16 reading; average 136/52 (pulse 51-65 bpm) Arm cuff Omron intellisense - accurate with < 105mmHg difference from manual reading   Wt Readings from Last 3 Encounters:  05/14/17 152 lb (68.9 kg)  04/23/17 155 lb (70.3 kg)  03/27/17 153 lb 12.8 oz (69.8 kg)   BP Readings from Last 3 Encounters:  06/17/17 (!) 122/54  05/14/17 (!) 132/58  05/06/17 (!) 136/58   Pulse Readings from Last 3 Encounters:  06/17/17 (!) 58  05/14/17 66  05/06/17 64    Past Medical History:  Diagnosis Date  . Adrenal mass ([place])   . Asthma   . BPH (benign prostatic hyperplasia)   . CKD (chronic kidney disease), stage III ([place])   . GERD (gastroesophageal reflux disease)   . H/O cardiovascular stress test 03/2012   EKG with 1-2 mm ST segment depression but normal perfusion images. Followup metastases test reduced exercise effort and functional status and determinate for myocardial  dysfunction  . History of cardiac monitoring    a. 2015: cardiac monitor revealed episodes of PACs, bradycardia, with heart rates down to 48, 4 beat PSVT, 6 beat run of NSVT at a rate of 131. There was no recurrent afib.   Marland Kitchen Hyperlipidemia LDL goal < 100   . Hypertension   . Hypothyroidism   . Macular degeneration   . MVP (mitral valve prolapse)    on cardiac cath - not mentioned on 2014 echo.  Marland Kitchen NSVT (nonsustained ventricular tachycardia) ([place])   . PAF (paroxysmal atrial fibrillation) ([place])    last episode 03/2012  . Pericarditis   . Premature atrial contractions   . Sinus bradycardia     Current Outpatient Medications on File Prior to Visit  Medication Sig Dispense Refill  . acetaminophen (TYLENOL 8 HOUR ARTHRITIS PAIN) 650 MG CR tablet Take 650 mg by mouth every 8 (eight) hours as needed for pain.    Marland Kitchen acetaminophen (TYLENOL) 500 MG tablet Take 500 mg by mouth every 6 (six) hours as needed for mild pain.    Marland Kitchen alfuzosin (UROXATRAL) 10 MG 24 hr tablet Take 10 mg by mouth daily.    Marland Kitchen amiodarone (PACERONE) 200 MG tablet Take 200 mg by mouth daily.    . chlorthalidone (HYGROTON) 25 MG tablet Take 1/2 tablet by mouth every other day or as directed 30 tablet 3  .  Cholecalciferol (VITAMIN D-3) 1000 UNITS CAPS Take 1 capsule by mouth daily.     . finasteride (PROSCAR) 5 MG tablet Take 5 mg by mouth daily.    . fish oil-omega-3 fatty acids 1000 MG capsule Take 1 g by mouth 2 (two) times daily.    . Levothyroxine Sodium 75 MCG CAPS Take 75 mcg by mouth daily.   2  . linaclotide (LINZESS) 72 MCG capsule Take 1 capsule (72 mcg total) by mouth daily before breakfast. 30 capsule 2  . loratadine (CLARITIN) 10 MG tablet Take 10 mg by mouth daily as needed for allergies.    Marland Kitchen losartan (COZAAR) 25 MG tablet Take 75 mg by mouth daily.     Marland Kitchen losartan (COZAAR) 50 MG tablet TAKE 1 TABLET BY MOUTH TWICE A DAY (Patient taking differently: TAKE 1 TABLET BY MOUTH in the morning and 1/2 tablet in the  afternoon.) 60 tablet 8  . meclizine (ANTIVERT) 25 MG tablet Take 25 mg by mouth 3 (three) times daily as needed for dizziness.   2  . metoprolol tartrate (LOPRESSOR) 25 MG tablet Take 0.5 tablets (12.5 mg total) by mouth 2 (two) times daily. 60 tablet 3  . Multiple Vitamins-Minerals (PRESERVISION AREDS) CAPS Take 1 capsule by mouth 2 (two) times daily. VITAMIN A FREE    . Polyethyl Glycol-Propyl Glycol (SYSTANE) 0.4-0.3 % SOLN Apply 2 drops to eye daily as needed (for dry eeys).    . rosuvastatin (CRESTOR) 5 MG tablet TAKE 1 TABLET (5 MG TOTAL) BY MOUTH DAILY AT 6 PM. 30 tablet 11  . XARELTO 15 MG TABS tablet TAKE 1 TABLET (15 MG TOTAL) BY MOUTH DAILY WITH SUPPER. 30 tablet 7   No current facility-administered medications on file prior to visit.     Allergies  Allergen Reactions  . Ciprofloxacin Other (See Comments)    Cannot take due to currently taking Amiodarone  . Hytrin [Terazosin] Other (See Comments)    Syncopal event     Blood pressure (!) 122/54, pulse (!) 58, SpO2 99 %.  Hypertension Blood pressure remains well controlled since last medication adjustment on 05/06/2017. No problems noted with current therapy and improved renal function also noted. Will continue current therapy as previously prescribed, low sodium diet,  and follow up as with HTN clinic and cardiologist.    Harrington Challenger PharmD, BCPS, [place] [address] 06/17/2017 8:26 PM

## 2017-06-17 NOTE — Patient Instructions (Addendum)
Return for a  follow up appointment as needed   Check your blood pressure at home daily (if able) and keep record of the readings.  Take your BP meds as follows: *ALL medication as prescribed*  Bring all of your meds, your BP cuff and your record of home blood pressures to your next appointment.  Exercise as you're able, try to walk approximately 30 minutes per day.  Keep salt intake to a minimum, especially watch canned and prepared boxed foods.  Eat more fresh fruits and vegetables and fewer canned items.  Avoid eating in fast food restaurants.    HOW TO TAKE YOUR BLOOD PRESSURE: . Rest 5 minutes before taking your blood pressure. .  Don't smoke or drink caffeinated beverages for at least 30 minutes before. . Take your blood pressure before (not after) you eat. . Sit comfortably with your back supported and both feet on the floor (don't cross your legs). . Elevate your arm to heart level on a table or a desk. . Use the proper sized cuff. It should fit smoothly and snugly around your bare upper arm. There should be enough room to slip a fingertip under the cuff. The bottom edge of the cuff should be 1 inch above the crease of the elbow. . Ideally, take 3 measurements at one sitting and record the average.

## 2017-06-21 ENCOUNTER — Other Ambulatory Visit: Payer: Self-pay | Admitting: Gastroenterology

## 2017-07-03 DIAGNOSIS — L570 Actinic keratosis: Secondary | ICD-10-CM | POA: Diagnosis not present

## 2017-07-03 DIAGNOSIS — Z23 Encounter for immunization: Secondary | ICD-10-CM | POA: Diagnosis not present

## 2017-07-03 DIAGNOSIS — Z86018 Personal history of other benign neoplasm: Secondary | ICD-10-CM | POA: Diagnosis not present

## 2017-07-03 DIAGNOSIS — Z85828 Personal history of other malignant neoplasm of skin: Secondary | ICD-10-CM | POA: Diagnosis not present

## 2017-07-03 DIAGNOSIS — L821 Other seborrheic keratosis: Secondary | ICD-10-CM | POA: Diagnosis not present

## 2017-07-08 DIAGNOSIS — N281 Cyst of kidney, acquired: Secondary | ICD-10-CM | POA: Diagnosis not present

## 2017-07-08 DIAGNOSIS — R3914 Feeling of incomplete bladder emptying: Secondary | ICD-10-CM | POA: Diagnosis not present

## 2017-07-08 DIAGNOSIS — N401 Enlarged prostate with lower urinary tract symptoms: Secondary | ICD-10-CM | POA: Diagnosis not present

## 2017-07-08 DIAGNOSIS — N261 Atrophy of kidney (terminal): Secondary | ICD-10-CM | POA: Diagnosis not present

## 2017-07-09 DIAGNOSIS — H1031 Unspecified acute conjunctivitis, right eye: Secondary | ICD-10-CM | POA: Diagnosis not present

## 2017-07-09 DIAGNOSIS — H0100A Unspecified blepharitis right eye, upper and lower eyelids: Secondary | ICD-10-CM | POA: Diagnosis not present

## 2017-07-09 DIAGNOSIS — L719 Rosacea, unspecified: Secondary | ICD-10-CM | POA: Diagnosis not present

## 2017-07-17 DIAGNOSIS — Z7901 Long term (current) use of anticoagulants: Secondary | ICD-10-CM | POA: Diagnosis not present

## 2017-07-17 DIAGNOSIS — S6991XA Unspecified injury of right wrist, hand and finger(s), initial encounter: Secondary | ICD-10-CM | POA: Diagnosis not present

## 2017-07-17 DIAGNOSIS — I10 Essential (primary) hypertension: Secondary | ICD-10-CM | POA: Diagnosis not present

## 2017-07-17 DIAGNOSIS — M25531 Pain in right wrist: Secondary | ICD-10-CM | POA: Diagnosis not present

## 2017-07-17 DIAGNOSIS — E785 Hyperlipidemia, unspecified: Secondary | ICD-10-CM | POA: Diagnosis not present

## 2017-07-21 DIAGNOSIS — R3912 Poor urinary stream: Secondary | ICD-10-CM | POA: Diagnosis not present

## 2017-07-21 DIAGNOSIS — R3914 Feeling of incomplete bladder emptying: Secondary | ICD-10-CM | POA: Diagnosis not present

## 2017-07-21 DIAGNOSIS — N401 Enlarged prostate with lower urinary tract symptoms: Secondary | ICD-10-CM | POA: Diagnosis not present

## 2017-08-07 DIAGNOSIS — H0100A Unspecified blepharitis right eye, upper and lower eyelids: Secondary | ICD-10-CM | POA: Diagnosis not present

## 2017-08-07 DIAGNOSIS — H548 Legal blindness, as defined in USA: Secondary | ICD-10-CM | POA: Diagnosis not present

## 2017-08-07 DIAGNOSIS — H5213 Myopia, bilateral: Secondary | ICD-10-CM | POA: Diagnosis not present

## 2017-08-07 DIAGNOSIS — H353134 Nonexudative age-related macular degeneration, bilateral, advanced atrophic with subfoveal involvement: Secondary | ICD-10-CM | POA: Diagnosis not present

## 2017-08-12 ENCOUNTER — Telehealth: Payer: Self-pay | Admitting: Gastroenterology

## 2017-08-12 DIAGNOSIS — R3914 Feeling of incomplete bladder emptying: Secondary | ICD-10-CM | POA: Diagnosis not present

## 2017-08-12 NOTE — Telephone Encounter (Signed)
Returned call, line busy. Will try again.

## 2017-08-13 NOTE — Telephone Encounter (Signed)
The pt is taking 145 mg linzess daily and a daily stool softener and still only has a BM every 3 days.  He is also concerned that the linzess is causing his creatinine to increase.  He states he does drink plenty of water.  Please advise.

## 2017-08-14 NOTE — Telephone Encounter (Signed)
Call back @ [phone]

## 2017-08-19 NOTE — Telephone Encounter (Signed)
The pt has been advised to increase to 290 mcg linzess.  He has enough samples to last until his appt on 3/27. He will keep the appt with Janett Billow as scheduled

## 2017-08-19 NOTE — Telephone Encounter (Signed)
There is no evidence that the Linzess would cause an increase in his Cr unless there was so much diarrhea that he was becoming dehydrated, which clearly is not the case.  We could give him samples of the 290 mcg if he would like to try that prior to his visit later this month.

## 2017-08-25 DIAGNOSIS — N401 Enlarged prostate with lower urinary tract symptoms: Secondary | ICD-10-CM | POA: Diagnosis not present

## 2017-08-25 DIAGNOSIS — N312 Flaccid neuropathic bladder, not elsewhere classified: Secondary | ICD-10-CM | POA: Diagnosis not present

## 2017-08-27 ENCOUNTER — Encounter: Payer: Self-pay | Admitting: Gastroenterology

## 2017-08-27 ENCOUNTER — Ambulatory Visit (INDEPENDENT_AMBULATORY_CARE_PROVIDER_SITE_OTHER): Payer: Medicare Other | Admitting: Gastroenterology

## 2017-08-27 VITALS — BP 116/52 | HR 61 | Ht 69.0 in | Wt 159.0 lb

## 2017-08-27 DIAGNOSIS — K5900 Constipation, unspecified: Secondary | ICD-10-CM | POA: Diagnosis not present

## 2017-08-27 MED ORDER — LINACLOTIDE 290 MCG PO CAPS: 290.0000 ug | ORAL_CAPSULE | Freq: Every day | ORAL | 5 refills | Status: DC

## 2017-08-27 MED ORDER — LINACLOTIDE 290 MCG PO CAPS: 290.0000 ug | ORAL_CAPSULE | Freq: Every day | ORAL | 0 refills | Status: DC

## 2017-08-27 NOTE — Progress Notes (Signed)
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  Was on Linzess 72 mcg at that time and was doing well.  Since then it has been increased to 145 mcg daily and now he is on 290 mcg daily for the past week.  he seems very satisfied with the results that he is seeing with this dose.  Says that he is having a BM daily.  Last colonoscopy was 11/2008 by Dr. Lajoyce Corners at which time he was found only have internal hemorrhoids.  Cologuard in 03/2017 was negative.   Past Medical History:  Diagnosis Date  . Adrenal mass ([place])   . Asthma   . BPH (benign prostatic hyperplasia)   . CKD (chronic kidney disease), stage III ([place])   . GERD (gastroesophageal reflux disease)   . H/O cardiovascular stress test 03/2012   EKG with 1-2 mm ST segment depression but normal perfusion images. Followup metastases test reduced exercise effort and functional status and determinate for myocardial dysfunction  . History of cardiac monitoring    a. 2015: cardiac monitor revealed episodes of PACs, bradycardia, with heart rates down to 48, 4 beat PSVT, 6 beat run of NSVT at a rate of 131. There was no recurrent afib.   Marland Kitchen Hyperlipidemia LDL goal < 100   . Hypertension   . Hypothyroidism   . Macular degeneration   . MVP (mitral valve prolapse)    on cardiac cath - not mentioned on 2014 echo.  Marland Kitchen NSVT (nonsustained ventricular tachycardia) ([place])   . PAF (paroxysmal atrial fibrillation) ([place])    last episode 03/2012  . Pericarditis   . Premature atrial contractions   . Sinus bradycardia    Past Surgical History:  Procedure Laterality Date  . ABDOMINAL VASCULAR ULTRASOUND EVALUATION  12/20/2008   mild non-obstructive plaque, abdominal aorta, with mild calcification at the bifurcation-no obstruction, no evidence for aneurysm of the abdominal aorta  .  CARDIAC CATHETERIZATION  01/04/1999   hyperdynamic LV function, probable angiographic mitral valve prolapse, systolic muscle bridging of the mid LAD without obstructive disease, systolic narrowing up to 53%  . Cardiopulmonary MET-test  07/20/2012   reduced exercise effort and functional status, peak max O2 comsumption was only 58% of predicted, cardiovascular status was interpreted as indeterminate for myocardial dysfunction due to the suboptimal peak cardiovascular stress load.    reports that he has never smoked. He has never used smokeless tobacco. He reports that he does not drink alcohol or use drugs. family history includes Stroke in his mother and paternal grandfather. Allergies  Allergen Reactions  . Ciprofloxacin Other (See Comments)    Cannot take due to currently taking Amiodarone  . Hytrin [Terazosin] Other (See Comments)    Syncopal event       Outpatient Encounter Medications as of 08/27/2017  Medication Sig  . acetaminophen (TYLENOL 8 HOUR ARTHRITIS PAIN) 650 MG CR tablet Take 650 mg by mouth every 8 (eight) hours as needed for pain.  Marland Kitchen acetaminophen (TYLENOL) 500 MG tablet Take 500 mg by mouth every 6 (six) hours as needed for mild pain.  Marland Kitchen alfuzosin (UROXATRAL) 10 MG 24 hr tablet Take 10 mg by mouth daily.  Marland Kitchen amiodarone (PACERONE) 200 MG tablet Take 200 mg by mouth daily.  . chlorthalidone (HYGROTON) 25 MG tablet Take 1/2 tablet by mouth every other  day or as directed  . Cholecalciferol (VITAMIN D-3) 1000 UNITS CAPS Take 1 capsule by mouth daily.   . finasteride (PROSCAR) 5 MG tablet Take 5 mg by mouth daily.  . fish oil-omega-3 fatty acids 1000 MG capsule Take 1 g by mouth 2 (two) times daily.  . Levothyroxine Sodium 75 MCG CAPS Take 75 mcg by mouth daily.   Marland Kitchen linaclotide (LINZESS) 290 MCG CAPS capsule Take 290 mcg by mouth daily before breakfast.  . loratadine (CLARITIN) 10 MG tablet Take 10 mg by mouth daily as needed for allergies.  Marland Kitchen losartan (COZAAR) 25 MG tablet Take  75 mg by mouth daily.   Marland Kitchen losartan (COZAAR) 50 MG tablet TAKE 1 TABLET BY MOUTH TWICE A DAY (Patient taking differently: TAKE 1 TABLET BY MOUTH in the morning and 1/2 tablet in the afternoon.)  . meclizine (ANTIVERT) 25 MG tablet Take 25 mg by mouth 3 (three) times daily as needed for dizziness.   . metoprolol tartrate (LOPRESSOR) 25 MG tablet Take 0.5 tablets (12.5 mg total) by mouth 2 (two) times daily.  . Multiple Vitamins-Minerals (PRESERVISION AREDS) CAPS Take 1 capsule by mouth 2 (two) times daily. VITAMIN A FREE  . Polyethyl Glycol-Propyl Glycol (SYSTANE) 0.4-0.3 % SOLN Apply 2 drops to eye daily as needed (for dry eeys).  . rosuvastatin (CRESTOR) 5 MG tablet TAKE 1 TABLET (5 MG TOTAL) BY MOUTH DAILY AT 6 PM.  . XARELTO 15 MG TABS tablet TAKE 1 TABLET (15 MG TOTAL) BY MOUTH DAILY WITH SUPPER.  . [DISCONTINUED] LINZESS 72 MCG capsule TAKE ONE CAPSULE BY MOUTH EVERY DAY BEFORE BREAKFAST   No facility-administered encounter medications on file as of 08/27/2017.      REVIEW OF SYSTEMS  : All other systems reviewed and negative except where noted in the History of Present Illness.   PHYSICAL EXAM: BP (!) 116/52   Pulse 61   Ht '5\' 9"'  (1.753 m)   Wt 159 lb (72.1 kg)   BMI 23.48 kg/m  General: Well developed white male in no acute distress Head: Normocephalic and atraumatic Eyes:  Sclerae anicteric, conjunctiva pink. Ears: Normal auditory acuity Lungs: Clear throughout to auscultation; no increased WOB. Heart: Regular rate and rhythm; no M/R/G. Abdomen: Soft, non-distended.  BS present.  Non-tender. Musculoskeletal: Symmetrical with no gross deformities  Skin: No lesions on visible extremities Extremities: No edema  Neurological: Alert oriented x 4, grossly non-focal Psychological:  Alert and cooperative. 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 Seemed to worsen after increasing amiodarone dose.  Having a daily BM after increasing Linzess to 290 mcg daily.  Will continue this--samples given and prescription sent.  He seems very satisfied with his results at this time. -CRC screening:  Cologuard negative 03/2017.   CC:  [name], MD

## 2017-08-27 NOTE — Patient Instructions (Signed)
If you are age 77 or older, your body mass index should be between 23-30. Your Body mass index is 23.48 kg/m. If this is out of the aforementioned range listed, please consider follow up with your Primary Care Provider.  If you are age 57 or younger, your body mass index should be between 19-25. Your Body mass index is 23.48 kg/m. If this is out of the aformentioned range listed, please consider follow up with your Primary Care Provider.   We have sent the following medications to your pharmacy for you to pick up at your convenience: Linzess 247mcg once daily  Thank You, [facility]

## 2017-08-28 ENCOUNTER — Telehealth: Payer: Self-pay | Admitting: Cardiovascular Disease

## 2017-08-28 NOTE — Telephone Encounter (Signed)
Okay to use water purifier

## 2017-08-28 NOTE — Telephone Encounter (Signed)
Patient called.patient is thinking about installing water purifier  NaCl is added to soften the water -- there will be about  25 mg in 8 oz or  400 mg in gallon, Patient  Aware will defer to Dr Claiborne Billings and pharmacist

## 2017-08-28 NOTE — Telephone Encounter (Signed)
New message  Pt wife verbalized that she is calling for RN  To see if their new water softning filter system will be detrimental to patient health   Please call and advise

## 2017-08-28 NOTE — Telephone Encounter (Signed)
Okay to use purifier. Just continue to follow appropriate low sodium diet.    Recent BP and BMET were within normal limits.

## 2017-08-28 NOTE — Progress Notes (Signed)
I agree with the above note, plan 

## 2017-08-29 NOTE — Telephone Encounter (Signed)
Patient aware it would be okay to purchase water purifier system

## 2017-09-09 ENCOUNTER — Other Ambulatory Visit: Payer: Self-pay | Admitting: Cardiovascular Disease

## 2017-09-16 ENCOUNTER — Telehealth: Payer: Self-pay | Admitting: Gastroenterology

## 2017-09-16 NOTE — Telephone Encounter (Signed)
Left message on machine to call back  

## 2017-09-16 NOTE — Telephone Encounter (Signed)
Patient states he is having some diarrhea and wants to know if we have any samples of the level two linzess he can try.

## 2017-09-22 NOTE — Telephone Encounter (Signed)
The pt has requested samples of Linzess 145 mcg due to 290 mcg causing diarrhea.  Samples left at the front desk   Samples given to patient  #4 boxes   Lot#     [id number]              Exp.    02/20  He will call for a prescription if this works better for him

## 2017-09-25 DIAGNOSIS — E785 Hyperlipidemia, unspecified: Secondary | ICD-10-CM | POA: Diagnosis not present

## 2017-09-25 DIAGNOSIS — E039 Hypothyroidism, unspecified: Secondary | ICD-10-CM | POA: Diagnosis not present

## 2017-09-25 DIAGNOSIS — D638 Anemia in other chronic diseases classified elsewhere: Secondary | ICD-10-CM | POA: Diagnosis not present

## 2017-09-25 DIAGNOSIS — N390 Urinary tract infection, site not specified: Secondary | ICD-10-CM | POA: Diagnosis not present

## 2017-09-25 DIAGNOSIS — E559 Vitamin D deficiency, unspecified: Secondary | ICD-10-CM | POA: Diagnosis not present

## 2017-09-25 DIAGNOSIS — I10 Essential (primary) hypertension: Secondary | ICD-10-CM | POA: Diagnosis not present

## 2017-09-30 DIAGNOSIS — R2681 Unsteadiness on feet: Secondary | ICD-10-CM | POA: Diagnosis not present

## 2017-09-30 DIAGNOSIS — I38 Endocarditis, valve unspecified: Secondary | ICD-10-CM | POA: Diagnosis not present

## 2017-09-30 DIAGNOSIS — I10 Essential (primary) hypertension: Secondary | ICD-10-CM | POA: Diagnosis not present

## 2017-09-30 DIAGNOSIS — R1013 Epigastric pain: Secondary | ICD-10-CM | POA: Diagnosis not present

## 2017-09-30 DIAGNOSIS — Z0001 Encounter for general adult medical examination with abnormal findings: Secondary | ICD-10-CM | POA: Diagnosis not present

## 2017-09-30 DIAGNOSIS — N189 Chronic kidney disease, unspecified: Secondary | ICD-10-CM | POA: Diagnosis not present

## 2017-09-30 DIAGNOSIS — N401 Enlarged prostate with lower urinary tract symptoms: Secondary | ICD-10-CM | POA: Diagnosis not present

## 2017-09-30 DIAGNOSIS — E7800 Pure hypercholesterolemia, unspecified: Secondary | ICD-10-CM | POA: Diagnosis not present

## 2017-09-30 DIAGNOSIS — R269 Unspecified abnormalities of gait and mobility: Secondary | ICD-10-CM | POA: Diagnosis not present

## 2017-09-30 DIAGNOSIS — E039 Hypothyroidism, unspecified: Secondary | ICD-10-CM | POA: Diagnosis not present

## 2017-09-30 DIAGNOSIS — I4891 Unspecified atrial fibrillation: Secondary | ICD-10-CM | POA: Diagnosis not present

## 2017-09-30 DIAGNOSIS — R5381 Other malaise: Secondary | ICD-10-CM | POA: Diagnosis not present

## 2017-10-06 ENCOUNTER — Other Ambulatory Visit: Payer: Self-pay | Admitting: *Deleted

## 2017-10-06 MED ORDER — LOSARTAN POTASSIUM 25 MG PO TABS: 75.0000 mg | ORAL_TABLET | Freq: Every day | ORAL | 1 refills | Status: DC

## 2017-10-08 DIAGNOSIS — D485 Neoplasm of uncertain behavior of skin: Secondary | ICD-10-CM | POA: Diagnosis not present

## 2017-10-08 DIAGNOSIS — C44219 Basal cell carcinoma of skin of left ear and external auricular canal: Secondary | ICD-10-CM | POA: Diagnosis not present

## 2017-10-13 ENCOUNTER — Other Ambulatory Visit: Payer: Self-pay | Admitting: Cardiovascular Disease

## 2017-10-14 ENCOUNTER — Ambulatory Visit (INDEPENDENT_AMBULATORY_CARE_PROVIDER_SITE_OTHER): Payer: Medicare Other | Admitting: Gastroenterology

## 2017-10-14 ENCOUNTER — Encounter: Payer: Self-pay | Admitting: Gastroenterology

## 2017-10-14 VITALS — BP 140/60 | HR 74 | Ht 68.0 in | Wt 161.0 lb

## 2017-10-14 DIAGNOSIS — R1013 Epigastric pain: Secondary | ICD-10-CM | POA: Diagnosis not present

## 2017-10-14 DIAGNOSIS — Z1212 Encounter for screening for malignant neoplasm of rectum: Secondary | ICD-10-CM | POA: Diagnosis not present

## 2017-10-14 MED ORDER — PANTOPRAZOLE SODIUM 40 MG PO TBEC: 40.0000 mg | DELAYED_RELEASE_TABLET | Freq: Every day | ORAL | 1 refills | Status: DC

## 2017-10-14 NOTE — Patient Instructions (Signed)
We have sent the following medications to your pharmacy for you to pick up at your convenience:  Pantoprazole 40 mg daily continue for one month then taper off as needed.

## 2017-10-14 NOTE — Progress Notes (Signed)
10/14/2017 Tanner Ho [id number] 08-07-1940   HISTORY OF PRESENT ILLNESS: This is a 77 year old male who is a patient of Dr. Ardis Hughs.  He has been seen here in the past for complaints of constipation.  He is here today at the request of his PCP, Dr. Shelia Media, for evaluation regarding epigastric abdominal pain.  The patient tells me that he started with this abdominal pain 2 or 3 weeks ago.  He says that he took some Gas-X which usually would help in any type of situation like this, but it was not helping.  He went to see his PCP and he was given pantoprazole 40 mg daily.  He has been taking that for the past 2 weeks and has had about complete resolution of his symptoms.  He denies any weight loss.  He  reports very rare issues with swallowing foods such as bread, but this has been intermittent for years.  In regards to his constipation he most recently had been on Linzess 145 mcg daily.  He says that now he is currently just taking MiraLAX twice a day and just using the Linzess on an as-needed basis and seems to be doing very well with that.   Past Medical History:  Diagnosis Date  . Adrenal mass ([place])   . Asthma   . BPH (benign prostatic hyperplasia)   . CKD (chronic kidney disease), stage III ([place])   . GERD (gastroesophageal reflux disease)   . H/O cardiovascular stress test 03/2012   EKG with 1-2 mm ST segment depression but normal perfusion images. Followup metastases test reduced exercise effort and functional status and determinate for myocardial dysfunction  . History of cardiac monitoring    a. 2015: cardiac monitor revealed episodes of PACs, bradycardia, with heart rates down to 48, 4 beat PSVT, 6 beat run of NSVT at a rate of 131. There was no recurrent afib.   Marland Kitchen Hyperlipidemia LDL goal < 100   . Hypertension   . Hypothyroidism   . Macular degeneration   . MVP (mitral valve prolapse)    on cardiac cath - not mentioned on 2014 echo.  Marland Kitchen NSVT (nonsustained ventricular  tachycardia) ([place])   . PAF (paroxysmal atrial fibrillation) ([place])    last episode 03/2012  . Pericarditis   . Premature atrial contractions   . Sinus bradycardia    Past Surgical History:  Procedure Laterality Date  . ABDOMINAL VASCULAR ULTRASOUND EVALUATION  12/20/2008   mild non-obstructive plaque, abdominal aorta, with mild calcification at the bifurcation-no obstruction, no evidence for aneurysm of the abdominal aorta  . CARDIAC CATHETERIZATION  01/04/1999   hyperdynamic LV function, probable angiographic mitral valve prolapse, systolic muscle bridging of the mid LAD without obstructive disease, systolic narrowing up to 65%  . Cardiopulmonary MET-test  07/20/2012   reduced exercise effort and functional status, peak max O2 comsumption was only 58% of predicted, cardiovascular status was interpreted as indeterminate for myocardial dysfunction due to the suboptimal peak cardiovascular stress load.    reports that he has never smoked. He has never used smokeless tobacco. He reports that he does not drink alcohol or use drugs. family history includes Stroke in his mother and paternal grandfather. Allergies  Allergen Reactions  . Ciprofloxacin Other (See Comments)    Cannot take due to currently taking Amiodarone  . Hytrin [Terazosin] Other (See Comments)    Syncopal event       Outpatient Encounter Medications as of 10/14/2017  Medication Sig  . acetaminophen (  TYLENOL 8 HOUR ARTHRITIS PAIN) 650 MG CR tablet Take 650 mg by mouth every 8 (eight) hours as needed for pain.  Marland Kitchen acetaminophen (TYLENOL) 500 MG tablet Take 500 mg by mouth every 6 (six) hours as needed for mild pain.  Marland Kitchen alfuzosin (UROXATRAL) 10 MG 24 hr tablet Take 10 mg by mouth daily.  Marland Kitchen amiodarone (PACERONE) 200 MG tablet Take 200 mg by mouth daily.  . chlorthalidone (HYGROTON) 25 MG tablet Take 1/2 tablet by mouth every other day or as directed  . finasteride (PROSCAR) 5 MG tablet Take 5 mg by mouth daily.  . fish oil-omega-3  fatty acids 1000 MG capsule Take 1 g by mouth 2 (two) times daily.  . Levothyroxine Sodium 75 MCG CAPS Take 75 mcg by mouth daily.   Marland Kitchen linaclotide (LINZESS) 290 MCG CAPS capsule Take 1 capsule (290 mcg total) by mouth daily before breakfast. (Patient taking differently: Take 145 mcg by mouth daily before breakfast. )  . loratadine (CLARITIN) 10 MG tablet Take 10 mg by mouth daily as needed for allergies.  Marland Kitchen losartan (COZAAR) 25 MG tablet Take 3 tablets (75 mg total) by mouth daily.  . meclizine (ANTIVERT) 25 MG tablet Take 25 mg by mouth 3 (three) times daily as needed for dizziness.   . metoprolol tartrate (LOPRESSOR) 25 MG tablet Take 0.5 tablets (12.5 mg total) by mouth 2 (two) times daily.  . Multiple Vitamins-Minerals (PRESERVISION AREDS) CAPS Take 1 capsule by mouth 2 (two) times daily. VITAMIN A FREE  . pantoprazole (PROTONIX) 40 MG tablet Take 40 mg by mouth daily.  Vladimir Faster Glycol-Propyl Glycol (SYSTANE) 0.4-0.3 % SOLN Apply 2 drops to eye daily as needed (for dry eeys).  . rosuvastatin (CRESTOR) 5 MG tablet TAKE 1 TABLET (5 MG TOTAL) BY MOUTH DAILY AT 6 PM.  . XARELTO 15 MG TABS tablet TAKE 1 TABLET (15 MG TOTAL) BY MOUTH DAILY WITH SUPPER.  Marland Kitchen Cholecalciferol (VITAMIN D-3 PO) Take 1 capsule by mouth daily.    No facility-administered encounter medications on file as of 10/14/2017.      REVIEW OF SYSTEMS  : All other systems reviewed and negative except where noted in the History of Present Illness.   PHYSICAL EXAM: BP 140/60   Pulse 74   Ht '5\' 8"'  (1.727 m)   Wt 161 lb (73 kg)   BMI 24.48 kg/m  General: Well developed white male in no acute distress Head: Normocephalic and atraumatic Eyes:  Sclerae anicteric, conjunctiva pink. Ears: Normal auditory acuity Lungs: Clear throughout to auscultation; no increased WOB. Heart: Regular rate and rhythm; no M/R/G. Abdomen: Soft, non-distended.  BS present.  Non-tender. Musculoskeletal: Symmetrical with no gross deformities  Skin:  No lesions on visible extremities Extremities: No edema  Neurological: Alert oriented x 4, grossly non-focal Psychological:  Alert and cooperative. Normal mood and affect  ASSESSMENT AND PLAN: *Epigastric pain:  About completely resolved with 2 weeks of pantoprazole 40 mg daily.  Will continue this for 2 months and then begin to taper to every other day before discontinuing.  I see no need for EGD at this point, but he will call back for worsening symptoms or recurrence of symptoms despite medication, etc. *Constipation:  Actually only using miralax BID with linzess 145 mcg prn currently and doing very well.   CC:  [name], MD

## 2017-10-15 NOTE — Progress Notes (Signed)
I agree with the above note, plan 

## 2017-10-22 DIAGNOSIS — H26492 Other secondary cataract, left eye: Secondary | ICD-10-CM | POA: Diagnosis not present

## 2017-10-22 DIAGNOSIS — H43811 Vitreous degeneration, right eye: Secondary | ICD-10-CM | POA: Diagnosis not present

## 2017-10-22 DIAGNOSIS — H353134 Nonexudative age-related macular degeneration, bilateral, advanced atrophic with subfoveal involvement: Secondary | ICD-10-CM | POA: Diagnosis not present

## 2017-10-22 DIAGNOSIS — H43812 Vitreous degeneration, left eye: Secondary | ICD-10-CM | POA: Diagnosis not present

## 2017-10-24 DIAGNOSIS — M5412 Radiculopathy, cervical region: Secondary | ICD-10-CM | POA: Diagnosis not present

## 2017-10-24 DIAGNOSIS — M5416 Radiculopathy, lumbar region: Secondary | ICD-10-CM | POA: Diagnosis not present

## 2017-11-04 ENCOUNTER — Other Ambulatory Visit: Payer: Self-pay

## 2017-11-04 DIAGNOSIS — M542 Cervicalgia: Secondary | ICD-10-CM | POA: Diagnosis not present

## 2017-11-04 DIAGNOSIS — M545 Low back pain: Secondary | ICD-10-CM | POA: Diagnosis not present

## 2017-11-04 MED ORDER — LOSARTAN POTASSIUM 25 MG PO TABS: 75.0000 mg | ORAL_TABLET | Freq: Every day | ORAL | 0 refills | Status: DC

## 2017-11-06 DIAGNOSIS — M545 Low back pain: Secondary | ICD-10-CM | POA: Diagnosis not present

## 2017-11-06 DIAGNOSIS — M542 Cervicalgia: Secondary | ICD-10-CM | POA: Diagnosis not present

## 2017-11-11 DIAGNOSIS — M545 Low back pain: Secondary | ICD-10-CM | POA: Diagnosis not present

## 2017-11-11 DIAGNOSIS — M542 Cervicalgia: Secondary | ICD-10-CM | POA: Diagnosis not present

## 2017-11-13 DIAGNOSIS — M545 Low back pain: Secondary | ICD-10-CM | POA: Diagnosis not present

## 2017-11-13 DIAGNOSIS — M542 Cervicalgia: Secondary | ICD-10-CM | POA: Diagnosis not present

## 2017-11-18 DIAGNOSIS — M545 Low back pain: Secondary | ICD-10-CM | POA: Diagnosis not present

## 2017-11-18 DIAGNOSIS — M542 Cervicalgia: Secondary | ICD-10-CM | POA: Diagnosis not present

## 2017-11-20 DIAGNOSIS — M542 Cervicalgia: Secondary | ICD-10-CM | POA: Diagnosis not present

## 2017-11-20 DIAGNOSIS — M545 Low back pain: Secondary | ICD-10-CM | POA: Diagnosis not present

## 2017-11-27 DIAGNOSIS — M545 Low back pain: Secondary | ICD-10-CM | POA: Diagnosis not present

## 2017-11-27 DIAGNOSIS — M542 Cervicalgia: Secondary | ICD-10-CM | POA: Diagnosis not present

## 2017-12-02 DIAGNOSIS — M545 Low back pain: Secondary | ICD-10-CM | POA: Diagnosis not present

## 2017-12-02 DIAGNOSIS — M542 Cervicalgia: Secondary | ICD-10-CM | POA: Diagnosis not present

## 2017-12-09 DIAGNOSIS — M545 Low back pain: Secondary | ICD-10-CM | POA: Diagnosis not present

## 2017-12-09 DIAGNOSIS — M542 Cervicalgia: Secondary | ICD-10-CM | POA: Diagnosis not present

## 2017-12-17 DIAGNOSIS — C44219 Basal cell carcinoma of skin of left ear and external auricular canal: Secondary | ICD-10-CM | POA: Diagnosis not present

## 2017-12-22 DIAGNOSIS — M545 Low back pain: Secondary | ICD-10-CM | POA: Diagnosis not present

## 2017-12-22 DIAGNOSIS — M542 Cervicalgia: Secondary | ICD-10-CM | POA: Diagnosis not present

## 2018-01-03 ENCOUNTER — Other Ambulatory Visit: Payer: Self-pay | Admitting: Gastroenterology

## 2018-01-07 DIAGNOSIS — D485 Neoplasm of uncertain behavior of skin: Secondary | ICD-10-CM | POA: Diagnosis not present

## 2018-01-07 DIAGNOSIS — C44622 Squamous cell carcinoma of skin of right upper limb, including shoulder: Secondary | ICD-10-CM | POA: Diagnosis not present

## 2018-01-07 DIAGNOSIS — D239 Other benign neoplasm of skin, unspecified: Secondary | ICD-10-CM | POA: Diagnosis not present

## 2018-01-07 DIAGNOSIS — L821 Other seborrheic keratosis: Secondary | ICD-10-CM | POA: Diagnosis not present

## 2018-01-07 DIAGNOSIS — Z85828 Personal history of other malignant neoplasm of skin: Secondary | ICD-10-CM | POA: Diagnosis not present

## 2018-01-07 DIAGNOSIS — L570 Actinic keratosis: Secondary | ICD-10-CM | POA: Diagnosis not present

## 2018-01-16 ENCOUNTER — Telehealth: Payer: Self-pay | Admitting: Cardiology

## 2018-01-16 NOTE — Telephone Encounter (Signed)
Attempted to contact pharmacy regarding clarification dosages. Was on hold for a while, will try again.

## 2018-01-16 NOTE — Telephone Encounter (Signed)
New Message        Pt c/o medication issue:  1. Name of Medication: Losartin 50 mg on back order (25/100 mg are avaliable  2. How are you currently taking this medication (dosage and times per day)? Not sure it has being tweaked.   3. Are you having a reaction (difficulty breathing--STAT)? No  4. What is your medication issue? Pharmacist need to clarifiy directions.

## 2018-01-20 NOTE — Telephone Encounter (Signed)
RAVEN AT PHARMACY NOTIFIED FOR PT TO TAKE 50MG  IN THE AM AND 25MG  IN THE PM..

## 2018-02-03 DIAGNOSIS — H00015 Hordeolum externum left lower eyelid: Secondary | ICD-10-CM | POA: Diagnosis not present

## 2018-02-03 DIAGNOSIS — H02052 Trichiasis without entropian right lower eyelid: Secondary | ICD-10-CM | POA: Diagnosis not present

## 2018-02-09 DIAGNOSIS — D0461 Carcinoma in situ of skin of right upper limb, including shoulder: Secondary | ICD-10-CM | POA: Diagnosis not present

## 2018-02-20 DIAGNOSIS — Z23 Encounter for immunization: Secondary | ICD-10-CM | POA: Diagnosis not present

## 2018-03-15 ENCOUNTER — Other Ambulatory Visit: Payer: Self-pay | Admitting: Cardiovascular Disease

## 2018-03-21 ENCOUNTER — Other Ambulatory Visit: Payer: Self-pay | Admitting: Cardiovascular Disease

## 2018-03-23 DIAGNOSIS — N401 Enlarged prostate with lower urinary tract symptoms: Secondary | ICD-10-CM | POA: Diagnosis not present

## 2018-03-23 DIAGNOSIS — R338 Other retention of urine: Secondary | ICD-10-CM | POA: Diagnosis not present

## 2018-03-23 DIAGNOSIS — N139 Obstructive and reflux uropathy, unspecified: Secondary | ICD-10-CM | POA: Diagnosis not present

## 2018-03-25 ENCOUNTER — Telehealth: Payer: Self-pay | Admitting: *Deleted

## 2018-03-25 MED ORDER — LOSARTAN POTASSIUM 25 MG PO TABS: 75.0000 mg | ORAL_TABLET | Freq: Every day | ORAL | 0 refills | Status: DC

## 2018-03-25 NOTE — Telephone Encounter (Signed)
Patients wife left a msg on the refill vm stating that [facility] has been trying to obtain an rx for losartan 50 mg.

## 2018-04-17 ENCOUNTER — Other Ambulatory Visit: Payer: Self-pay | Admitting: Cardiovascular Disease

## 2018-04-27 ENCOUNTER — Ambulatory Visit (INDEPENDENT_AMBULATORY_CARE_PROVIDER_SITE_OTHER): Payer: Medicare Other | Admitting: Cardiovascular Disease

## 2018-04-27 ENCOUNTER — Encounter: Payer: Self-pay | Admitting: Cardiovascular Disease

## 2018-04-27 ENCOUNTER — Encounter

## 2018-04-27 VITALS — BP 142/66 | HR 61 | Ht 68.5 in | Wt 158.4 lb

## 2018-04-27 DIAGNOSIS — N183 Chronic kidney disease, stage 3 unspecified: Secondary | ICD-10-CM

## 2018-04-27 DIAGNOSIS — I10 Essential (primary) hypertension: Secondary | ICD-10-CM | POA: Diagnosis not present

## 2018-04-27 DIAGNOSIS — Z7901 Long term (current) use of anticoagulants: Secondary | ICD-10-CM

## 2018-04-27 DIAGNOSIS — E039 Hypothyroidism, unspecified: Secondary | ICD-10-CM

## 2018-04-27 DIAGNOSIS — I480 Paroxysmal atrial fibrillation: Secondary | ICD-10-CM

## 2018-04-27 DIAGNOSIS — I341 Nonrheumatic mitral (valve) prolapse: Secondary | ICD-10-CM

## 2018-04-27 NOTE — Patient Instructions (Signed)
Medication Instructions:  Your physician recommends that you continue on your current medications as directed. Please refer to the Current Medication list given to you today.  If you need a refill on your cardiac medications before your next appointment, please call your pharmacy.    Follow-Up: At [facility], you and your health needs are our priority.  As part of our continuing mission to provide you with exceptional heart care, we have created designated Provider Care Teams.  These Care Teams include your primary Cardiologist (physician) and Advanced Practice Providers (APPs -  Physician Assistants and Nurse Practitioners) who all work together to provide you with the care you need, when you need it. You will need a follow up appointment in 12 months.  Please call our office 2 months in advance to schedule this appointment.  You may see Thomas Kelly, MD or one of the following Advanced Practice Providers on your designated Care Team: Hao Meng, PA-C . Angela Duke, PA-C  

## 2018-04-27 NOTE — Progress Notes (Signed)
Patient ID: Tanner Ho, male   DOB: 17-May-1941, 77 y.o.   MRN: [id number]    Primary M.D.: Dr. [name]  HPI: Tanner Ho is a 77 y.o. male who presents to the office today for a 13 month follow-up cardiology evaluation.  Tanner Ho has a history of mitral valve prolapse, hypertension, hyperlipidemia, hypothyroidism, as well as paroxysmal atrial fibrillation. He is on chronic Coumadin anticoagulation. His last documented atrial fibrillation episode was in October 2013. A nuclear perfusion study in October 2013 revealed normal perfusion imaging but he developed 1-2 mm of ST segment depression at peak stress test and the possibility of microvascular etiology leading to his ECG abnormalities. A cardiopulmonary met test on 09/17/2012 demonstarated a reduced peak maximum oxygen consumption at 58%. He had a suboptimal peak cardiovascular stress load making cardiovascular status interpretation indeterminate. He did have mild ventilation/perfusion mismatch suggesting impaired ulnar circulation plus minus increased dead space. He had a blunted chronotropic response to exercise. The test was limited by leg fatigue. When I saw him in the office subsequently I reduced his Cardizem from 240 to180 mg. At times, he notes a rare palpitation. He denies any awareness of breakthrough atrial fibrillation.   He presented to the emergency room in November 2014 with some vague chest pain. In retrospect, he feels this may have been GERD symptoms. Cardiac enzymes were negative. ECG was without changes. A nuclear perfusion study on 04/22/2013 was low risk and showed mild diaphragmatic attenuation but did not show a region of scar or ischemia, unchanged from his prior study of several years previously.  In 2015 he had noticed some dizziness and decreased energy   A cardiac monitor revealed episodes of PACs, bradycardia, with heart rates down to 48, but he also had a 6 beat run of wide-complex tachycardia at a rate of 131.   Remotely, he had developed breakthrough atrial fibrillation on amiodarone 100 mg and he has been on 100 mg, alternating with 200 mg daily.  On this most recent monitor, there were no episodes of recurrent AF.   He has been on amiodarone 100 mg daily, Norvasc 2.5 mg, losartan 50 mg bid, HCTZ 12.5 mg daily, Toprol-XL 12.5 mg daily, Crestor 5 mg and Coumadin.  He also has noticed rare episodes of vertigo for which he takes meclizine.    He also has been on Uroxatral to improve urinary flow. When I last saw him we had a lengthy discussion concerning warfarin versus new or anticoagulants.  He opted to switch to Xarelto and has been maintained on 15 mg daily based on his creatinine clearance.  He has tolerated this well without bleeding.  He was evaluated by Samara Snide the office on 12/27/2015 and had noticed an occasional irregularity of his heartbeat.  When she evaluated him, he was still in sinus rhythm without ectopy and there were no acute changes.  Presently continues to feel that his pulse has been stable.  He denies chest pain.  He denies presyncope or syncope.  When I saw him in follow-up, he was in sinus rhythm.  When I saw Tanner Ho 1 week ago for a follow-up cardiology evaluation, he was very stable.  He was denying any episodes of palpitations.  He is not required use of when necessary metoprolol.  His ECG revealed sinus bradycardia at 58 bpm.  He was unaware of any any heart rate irregularity. Dr. Shelia Media had checked his laboratory.  He denied any episodes of chest pain, presyncope or syncope.  He developed recurrent atrial fibrillation with a rapid ventricular response and presented to [facility] ER on March 15.  He was given metoprolol 5 mg intravenously which brought his heart rate down.  He went home after several hours.  The following morning he was advised to increase amiodarone from 100 mg to 200 mg daily.  That night, again he develop recurrent AF with rapid ventricular response and repeat  presented to the emergency room on 08/16/2016.  At this time.  He was given metoprolol IV 2.5 mg 2.  He also was advised to take metoprolol 50 mg twice a day.  On this increased beta blocker dose, his blood pressure became low and he called and spoke with Rosaria Ferries several days later.  His medications were adjusted and he was told to stop amlodipine, reduce losartan to 25 mg, and reduce metoprolol to 25 mg twice a day.  When I last saw him 3 weeks ago, he was in atrial fibrillation with ventricular rate in the 90s.  I recommended further titration of amiodarone and increase this to 200 mg twice a day.  Also is on levothyroxine at 50 g.  TSH was normal at 3.38.  He has stage III chronic kidney disease.  He underwent a 2-D echo Doppler study on 09/04/2016 and was still in atrial fibrillation at that time.  Ejection fraction was 55-60%.  There was mild late systolic prolapse of both leaflets with mild MR.  There was mild PA pressure elevation at 39 mm.  Aortic root dimension measured 39 mm.  On the following evening, he began to notice his heart rate reducing down into the upper 40s to 50s and his rhythm appeared more regular. He called the office and was advised to reduce his metoprolol dose.  He also a completely discontinue this altogether.  He now notes his pulse typically in the 60s.  His blood pressure has risen to the 140s. He presents for reevaluation.  When I  saw him in April 2018, he was still in atrial fibrillation.  I recommended he continue amiodarone 200 mg twice a day at least for the next month.  At that time, I recommended he defer undergoing his YAG laser surgery procedure until his atrial fibrillation was controlled.  When he was seen in May 2018 , he had converted to sinus rhythm and with  pharmacologic cardioversion, he develops sinus bradycardia with rates in the upper 40s.  His metoprolol dose was reduced and ultimately discontinued.  Over the past several months, he  continued to feel  well.   When I saw him in September 2018, he was in normal sinus rhythm at 64 bpm.  With a creatinine of 1.78  I recommended that he decrease HCTZ to just 2 times per week depending upon his swelling and blood pressure.  He had called the office several weeks later complaining of increased heart rate irregularity and was seen on March 27, 2017.  His ECG revealed sinus rhythm at 71 bpm with occasional PACs occurring almost every 6 or 7 beats.  I recommended initiation of low-dose metoprolol at 12.5 twice a day.  Was to monitor his blood pressure.  He continued to be on Xarelto without bleeding.  There was no edema and reduced dose of HCTZ.  As I last saw him, he has been evaluated by the pharmacist on several occasions.  Most recently he has been on amiodarone 200 mg daily, chlorthalidone 12.5 mg on Monday Wednesday and Fridays, losartan 50 mg in  the morning and 25 mg in the evening and apparently is no longer taking metoprolol.  He has felt well.  He denies chest pain shortness of breath or palpitations.  He does not believe he has had any recurrent A. fib over the past year.  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  He presents for evaluation.   Past Medical History:  Diagnosis Date  . Adrenal mass ([place])   . Asthma   . BPH (benign prostatic hyperplasia)   . CKD (chronic kidney disease), stage III ([place])   . GERD (gastroesophageal reflux disease)   . H/O cardiovascular stress test 03/2012   EKG with 1-2 mm ST segment depression but normal perfusion images. Followup metastases test reduced exercise effort and functional status and determinate for myocardial dysfunction  . History of cardiac monitoring    a. 2015: cardiac monitor revealed episodes of PACs, bradycardia, with heart rates down to 48, 4 beat PSVT, 6 beat run of NSVT at a rate of 131. There was no recurrent afib.   Marland Kitchen Hyperlipidemia LDL goal < 100   . Hypertension   . Hypothyroidism   . Macular degeneration    . MVP (mitral valve prolapse)    on cardiac cath - not mentioned on 2014 echo.  Marland Kitchen NSVT (nonsustained ventricular tachycardia) ([place])   . PAF (paroxysmal atrial fibrillation) ([place])    last episode 03/2012  . Pericarditis   . Premature atrial contractions   . Sinus bradycardia     Past Surgical History:  Procedure Laterality Date  . ABDOMINAL VASCULAR ULTRASOUND EVALUATION  12/20/2008   mild non-obstructive plaque, abdominal aorta, with mild calcification at the bifurcation-no obstruction, no evidence for aneurysm of the abdominal aorta  . CARDIAC CATHETERIZATION  01/04/1999   hyperdynamic LV function, probable angiographic mitral valve prolapse, systolic muscle bridging of the mid LAD without obstructive disease, systolic narrowing up to 94%  . Cardiopulmonary MET-test  07/20/2012   reduced exercise effort and functional status, peak max O2 comsumption was only 58% of predicted, cardiovascular status was interpreted as indeterminate for myocardial dysfunction due to the suboptimal peak cardiovascular stress load.    Allergies  Allergen Reactions  . Ciprofloxacin Other (See Comments)    Cannot take due to currently taking Amiodarone  . Hytrin [Terazosin] Other (See Comments)    Syncopal event     Current Outpatient Medications  Medication Sig Dispense Refill  . acetaminophen (TYLENOL 8 HOUR ARTHRITIS PAIN) 650 MG CR tablet Take 650 mg by mouth every 8 (eight) hours as needed for pain.    Marland Kitchen acetaminophen (TYLENOL) 500 MG tablet Take 500 mg by mouth every 6 (six) hours as needed for mild pain.    Marland Kitchen alfuzosin (UROXATRAL) 10 MG 24 hr tablet Take 10 mg by mouth daily.    Marland Kitchen amiodarone (PACERONE) 200 MG tablet Take 200 mg by mouth daily.    . chlorthalidone (HYGROTON) 25 MG tablet every other day. 1/2 TABLET Mondays, Wednesdays, Fridays    . Cholecalciferol (VITAMIN D-3 PO) Take 1 capsule by mouth daily.     . finasteride (PROSCAR) 5 MG tablet Take 5 mg by mouth daily.    . fish oil-omega-3  fatty acids 1000 MG capsule Take 1 g by mouth 2 (two) times daily.    . Levothyroxine Sodium 75 MCG CAPS Take 75 mcg by mouth daily.   2  . linaclotide (LINZESS) 145 MCG CAPS capsule Linzess 145 mcg capsule    .  loratadine (CLARITIN) 10 MG tablet Take 10 mg by mouth daily as needed for allergies.    Marland Kitchen losartan (COZAAR) 25 MG tablet TAKE 3 TABLETS BY MOUTH DAILY 90 tablet 0  . losartan (COZAAR) 50 MG tablet TAKE 1 TABLET BY MOUTH IN THE MORNING AND 1/2 TABLET IN THE EVENING 45 tablet 23  . meclizine (ANTIVERT) 25 MG tablet Take 25 mg by mouth 3 (three) times daily as needed for dizziness.   2  . Multiple Vitamins-Minerals (PRESERVISION AREDS) CAPS Take 1 capsule by mouth 2 (two) times daily. VITAMIN A FREE    . pantoprazole (PROTONIX) 40 MG tablet TAKE 1 TABLET BY MOUTH DAILY 30 tablet 1  . Polyethyl Glycol-Propyl Glycol (SYSTANE) 0.4-0.3 % SOLN Apply 2 drops to eye daily as needed (for dry eeys).    . rosuvastatin (CRESTOR) 5 MG tablet TAKE 1 TABLET (5 MG TOTAL) BY MOUTH DAILY AT 6 PM. 30 tablet 11  . XARELTO 15 MG TABS tablet TAKE 1 TABLET (15 MG TOTAL) BY MOUTH DAILY WITH SUPPER. 90 tablet 1   No current facility-administered medications for this visit.     Social History   Socioeconomic History  . Marital status: Married    Spouse name: Not on file  . Number of children: Not on file  . Years of education: Not on file  . Highest education level: Not on file  Occupational History  . Not on file  Social Needs  . Financial resource strain: Not on file  . Food insecurity:    Worry: Not on file    Inability: Not on file  . Transportation needs:    Medical: Not on file    Non-medical: Not on file  Tobacco Use  . Smoking status: Never Smoker  . Smokeless tobacco: Never Used  Substance and Sexual Activity  . Alcohol use: No  . Drug use: No  . Sexual activity: Not on file  Lifestyle  . Physical activity:    Days per week: Not on file    Minutes per session: Not on file  .  Stress: Not on file  Relationships  . Social connections:    Talks on phone: Not on file    Gets together: Not on file    Attends religious service: Not on file    Active member of club or organization: Not on file    Attends meetings of clubs or organizations: Not on file    Relationship status: Not on file  . Intimate partner violence:    Fear of current or ex partner: Not on file    Emotionally abused: Not on file    Physically abused: Not on file    Forced sexual activity: Not on file  Other Topics Concern  . Not on file  Social History Narrative  . Not on file    Social history is notable in that he is married. 2 children 4 grandchildren. No tobacco alcohol use.  ROS General: Negative; No fevers, chills, or night sweats;  HEENT: Negative; No changes in vision or hearing, sinus congestion, difficulty swallowing Pulmonary: Negative; No cough, wheezing, shortness of breath, hemoptysis Cardiovascular: See history of present illness Edema has improved GI: Positive for GERD; No nausea, vomiting, diarrhea, or abdominal pain GU: Negative; No dysuria, hematuria, or difficulty voiding Musculoskeletal: Negative; no myalgias, joint pain, or weakness Hematologic/Oncology: Negative; no easy bruising, bleeding Endocrine: Negative; no heat/cold intolerance; no diabetes Neuro: Negative; no changes in balance, headaches Skin: Negative; No rashes or skin lesions  Psychiatric: Negative; No behavioral problems, depression Sleep: Negative; No snoring, daytime sleepiness, hypersomnolence, bruxism, restless legs, hypnogognic hallucinations, no cataplexy Other comprehensive 14 point system review is negative.   PE BP (!) 142/66   Pulse 61   Ht 5' 8.5" (1.74 m)   Wt 158 lb 6.4 oz (71.8 kg)   BMI 23.73 kg/m    Repeat blood pressure by me was 140/70 supine and 136/72 standing.  Wt Readings from Last 3 Encounters:  04/27/18 158 lb 6.4 oz (71.8 kg)  10/14/17 161 lb (73 kg)  08/27/17 159 lb  (72.1 kg)   General: Alert, oriented, no distress.  Skin: normal turgor, no rashes, warm and dry HEENT: Normocephalic, atraumatic. Pupils equal round and reactive to light; sclera anicteric; extraocular muscles intact;  Nose without nasal septal hypertrophy Mouth/Parynx benign; Mallinpatti scale 3 Neck: No JVD, no carotid bruits; normal carotid upstroke Lungs: clear to ausculatation and percussion; no wheezing or rales Chest wall: without tenderness to palpitation Heart: PMI not displaced, RRR, s1 s2 normal, 1/6 systolic murmur, no diastolic murmur, no rubs, gallops, thrills, or heaves Abdomen: Moderate diastases recti ; soft, nontender; no hepatosplenomehaly, BS+; abdominal aorta nontender and not dilated by palpation. Back: no CVA tenderness Pulses 2+ Musculoskeletal: full range of motion, normal strength, no joint deformities Extremities: no clubbing cyanosis or edema, Homan's sign negative  Neurologic: grossly nonfocal; Cranial nerves grossly wnl Psychologic: Normal mood and affect   ECG (independently read by me): Normal sinus rhythm at 61 bpm.  Nonspecific ST-T changes.  No ectopy.  October 2018 ECG (independently read by me): Normal sinus rhythm at 71 bpm, occasional PACs every sixth beat, nonspecific ST changes.  QTc interval 473 ms.  September 2018 (independently read by me): Normal sinus rhythm at 64 bpm.  QTc interval 455 ms.  Possible left atrial enlargement.  Nonspecific ST-T changes.  May 2018 ECG (independently read by me): Sinus bradycardia 59 bpm.  Possible left atrial enlargement.  No ST segment changes.  QTc interval 463 ms.  April 2018 ECG (independently read by me): Normal sinus rhythm at 69 bpm.  PACs, nonspecific ST changes.  Normal intervals.  08/20/2016 ECG (independently read by me): Atrial fibrillation with ventricular rate at 96 bpm.  Nonspecific ST-T changes.  Lateral T-wave changes.  08/13/2016 ECG (independently read by me): Sinus bradycardia 58 bpm.   Nonspecific ST changes.  Normal intervals.  September 2017 ECG (independently read by me): Sinus rhythm at 68.  Normal intervals.  No significant ST-T changes.  December 2016 ECG (independently read by me): Normal sinus rhythm at 61 bpm.  No ectopy.  QTc interval 434 ms.  June 2016 ECG (independently read by me): Sinus bradycardia 52 bpm.  QTc interval 407 ms.  PR interval 158 msec  December 2015 ECG (independently read by me): Sinus bradycardia at 49 bpm.  Nonspecific ST changes.  QTc interval 424 ms.  ECG (independently read by me): Sinus bradycardia 50 beats per minute.  Nonspecific ST changes.    Prior ECG: Sinus bradycardia at 50 beats per minute. No ectopy on ECG. Intervals normal  LABS: BMP Latest Ref Rng & Units 06/12/2017 01/15/2017 10/07/2016  Glucose 65 - 99 mg/dL 91 83 91  BUN 8 - 27 mg/dL 21 31(H) 23  Creatinine 0.76 - 1.27 mg/dL 1.32(H) 1.78(H) 1.68(H)  BUN/Creat Ratio 10 - '24 16 17 ' -  Sodium 134 - 144 mmol/L 136 137 141  Potassium 3.5 - 5.2 mmol/L 4.4 5.1 4.7  Chloride 96 - 106 mmol/L  98 95(L) 105  CO2 20 - 29 mmol/L '25 25 28  ' Calcium 8.6 - 10.2 mg/dL 9.3 9.1 8.9   Hepatic Function Latest Ref Rng & Units 12/28/2014 01/05/2014  Total Protein 6.1 - 8.1 g/dL 6.2 6.7  Albumin 3.6 - 5.1 g/dL 3.7 4.4  AST 10 - 35 U/L 17 15  ALT 9 - 46 U/L 15 15  Alk Phosphatase 40 - 115 U/L 117(H) 107  Total Bilirubin 0.2 - 1.2 mg/dL 0.6 0.4   CBC Latest Ref Rng & Units 08/15/2016 12/27/2015 12/28/2014  WBC 4.0 - 10.5 K/uL 5.6 5.0 4.7  Hemoglobin 13.0 - 17.0 g/dL 11.9(L) 13.2 13.3  Hematocrit 39.0 - 52.0 % 34.9(L) 38.6 39.5  Platelets 150 - 400 K/uL 156 158 173   Lab Results  Component Value Date   MCV 100.6 (H) 08/15/2016   MCV 100.5 (H) 12/27/2015   MCV 100.0 12/28/2014   Lab Results  Component Value Date   TSH 3.38 08/20/2016   Lipid Panel     Component Value Date/Time   CHOL 120 (L) 12/28/2014 0924   TRIG 85 12/28/2014 0924   HDL 49 12/28/2014 0924   CHOLHDL 2.4 12/28/2014  0924   VLDL 17 12/28/2014 0924   LDLCALC 54 12/28/2014 0924     RADIOLOGY: No results found.  IMPRESSION:  1. Essential hypertension   2. Paroxysmal atrial fibrillation (HCC)   3. Chronic anticoagulation   4. CKD (chronic kidney disease), stage III ([place])   5. Hypothyroidism, unspecified type   6. Mitral valve prolapse     ASSESSMENT AND PLAN: Ms. Saavedra is a 77 year old gentleman who has a history of hypertension, hyperlipidemia, hypothyroidism, mitral valve prolapse, as well as paroxysmal atrial fibrillation. He had been maintaining sinus rhythm and was without recurrent atrial fibrillation until July 2017 when he developed recurrent atrial fibrillation with rapid ventricular response and was evaluated on 2 consecutive evenings in the emergency room where he was treated with IV metoprolol for rate slowing. He developed recurrent atrial fibrillation in April 2018 and ultimately pharmacologically cardioverted back to sinus rhythm.  When I last saw him, he was having some PACs and I initiated low-dose beta-blocker therapy.  Currently over the past year this has been discontinued by pharmacy.  He has been maintaining sinus rhythm on low-dose amiodarone 200 mg daily.  He continues to be on Xarelto for anticoagulation and is without bleeding.  His blood pressure today is stable for normal on his regimen consisting of chlorthalidone 12.5 mg on Monday Wednesday and Friday, and losartan 75 mg daily for which he takes in a 50 mg in the morning and 25 mg in the evening regimen.  He continues to be on levothyroxine for hypothyroidism.  He is tolerating rosuvastatin for hyperlipidemia and is at 5 mg.  Recent laboratory in April 2019 showed LDL cholesterol at 69.  He has stage III chronic kidney disease and creatinine in April 2019 was 1.5.  He will be seeing Dr. Concha Pyo in follow-up evaluation in 6 months.  I will see him in 1 year for cardiology reevaluation.  Time spent: 25 minutes Troy Sine, MD, [facility]   04/27/2018 1:41 PM

## 2018-05-15 DIAGNOSIS — Z85828 Personal history of other malignant neoplasm of skin: Secondary | ICD-10-CM | POA: Diagnosis not present

## 2018-05-15 DIAGNOSIS — L218 Other seborrheic dermatitis: Secondary | ICD-10-CM | POA: Diagnosis not present

## 2018-05-15 DIAGNOSIS — L570 Actinic keratosis: Secondary | ICD-10-CM | POA: Diagnosis not present

## 2018-05-15 DIAGNOSIS — L821 Other seborrheic keratosis: Secondary | ICD-10-CM | POA: Diagnosis not present

## 2018-05-22 ENCOUNTER — Other Ambulatory Visit: Payer: Self-pay | Admitting: Cardiovascular Disease

## 2018-05-25 ENCOUNTER — Other Ambulatory Visit: Payer: Self-pay | Admitting: Cardiovascular Disease

## 2018-07-10 DIAGNOSIS — Z85828 Personal history of other malignant neoplasm of skin: Secondary | ICD-10-CM | POA: Diagnosis not present

## 2018-07-10 DIAGNOSIS — D1801 Hemangioma of skin and subcutaneous tissue: Secondary | ICD-10-CM | POA: Diagnosis not present

## 2018-07-10 DIAGNOSIS — L814 Other melanin hyperpigmentation: Secondary | ICD-10-CM | POA: Diagnosis not present

## 2018-07-10 DIAGNOSIS — D225 Melanocytic nevi of trunk: Secondary | ICD-10-CM | POA: Diagnosis not present

## 2018-07-10 DIAGNOSIS — L821 Other seborrheic keratosis: Secondary | ICD-10-CM | POA: Diagnosis not present

## 2018-07-10 DIAGNOSIS — L570 Actinic keratosis: Secondary | ICD-10-CM | POA: Diagnosis not present

## 2018-08-05 DIAGNOSIS — N401 Enlarged prostate with lower urinary tract symptoms: Secondary | ICD-10-CM | POA: Diagnosis not present

## 2018-08-05 DIAGNOSIS — N261 Atrophy of kidney (terminal): Secondary | ICD-10-CM | POA: Diagnosis not present

## 2018-08-05 DIAGNOSIS — N139 Obstructive and reflux uropathy, unspecified: Secondary | ICD-10-CM | POA: Diagnosis not present

## 2018-08-05 DIAGNOSIS — N312 Flaccid neuropathic bladder, not elsewhere classified: Secondary | ICD-10-CM | POA: Diagnosis not present

## 2018-08-05 DIAGNOSIS — R3914 Feeling of incomplete bladder emptying: Secondary | ICD-10-CM | POA: Diagnosis not present

## 2018-08-10 DIAGNOSIS — H524 Presbyopia: Secondary | ICD-10-CM | POA: Diagnosis not present

## 2018-08-10 DIAGNOSIS — H353134 Nonexudative age-related macular degeneration, bilateral, advanced atrophic with subfoveal involvement: Secondary | ICD-10-CM | POA: Diagnosis not present

## 2018-08-10 DIAGNOSIS — H0100A Unspecified blepharitis right eye, upper and lower eyelids: Secondary | ICD-10-CM | POA: Diagnosis not present

## 2018-08-10 DIAGNOSIS — H43813 Vitreous degeneration, bilateral: Secondary | ICD-10-CM | POA: Diagnosis not present

## 2018-09-09 ENCOUNTER — Telehealth: Payer: Self-pay | Admitting: Pharmacist

## 2018-09-09 NOTE — Telephone Encounter (Signed)
Patient's wife called clinic. Tanner Ho is having low BP.   BP  (pulse 53-61bpm) 105/37  (MAP 60)  127/55  123/49  106/38 (MAP 61)  137/45  109/39 (MAP 62)  Denies dizziness, HA, blurry vision. Report increased fatigue.  *Recommendation:  1. HOLD chlorthalidone for the rest of the week. Resume normla dose on Monday April/13  2. Stay hydrated  3. Call back if BP remains low or drop lower

## 2018-10-01 ENCOUNTER — Other Ambulatory Visit: Payer: Self-pay | Admitting: Cardiovascular Disease

## 2018-10-02 DIAGNOSIS — Z125 Encounter for screening for malignant neoplasm of prostate: Secondary | ICD-10-CM | POA: Diagnosis not present

## 2018-10-02 DIAGNOSIS — E039 Hypothyroidism, unspecified: Secondary | ICD-10-CM | POA: Diagnosis not present

## 2018-10-02 DIAGNOSIS — I10 Essential (primary) hypertension: Secondary | ICD-10-CM | POA: Diagnosis not present

## 2018-10-05 DIAGNOSIS — D649 Anemia, unspecified: Secondary | ICD-10-CM | POA: Diagnosis not present

## 2018-10-06 ENCOUNTER — Other Ambulatory Visit: Payer: Self-pay | Admitting: Cardiovascular Disease

## 2018-10-06 NOTE — Telephone Encounter (Signed)
78yo, 158lbs, Scr 1.5 on 09/25/17 per [facility], Crcl 43ml/min Last OV 04/27/18 Indication: Afib  Will refill given COVID

## 2018-10-07 DIAGNOSIS — J302 Other seasonal allergic rhinitis: Secondary | ICD-10-CM | POA: Diagnosis not present

## 2018-10-07 DIAGNOSIS — I10 Essential (primary) hypertension: Secondary | ICD-10-CM | POA: Diagnosis not present

## 2018-10-07 DIAGNOSIS — I4891 Unspecified atrial fibrillation: Secondary | ICD-10-CM | POA: Diagnosis not present

## 2018-10-07 DIAGNOSIS — E039 Hypothyroidism, unspecified: Secondary | ICD-10-CM | POA: Diagnosis not present

## 2018-10-07 DIAGNOSIS — E7800 Pure hypercholesterolemia, unspecified: Secondary | ICD-10-CM | POA: Diagnosis not present

## 2018-10-07 DIAGNOSIS — N401 Enlarged prostate with lower urinary tract symptoms: Secondary | ICD-10-CM | POA: Diagnosis not present

## 2018-10-07 DIAGNOSIS — H9313 Tinnitus, bilateral: Secondary | ICD-10-CM | POA: Diagnosis not present

## 2018-10-07 DIAGNOSIS — D649 Anemia, unspecified: Secondary | ICD-10-CM | POA: Diagnosis not present

## 2018-10-07 DIAGNOSIS — H3530 Unspecified macular degeneration: Secondary | ICD-10-CM | POA: Diagnosis not present

## 2018-10-07 DIAGNOSIS — K219 Gastro-esophageal reflux disease without esophagitis: Secondary | ICD-10-CM | POA: Diagnosis not present

## 2018-10-21 DIAGNOSIS — H9313 Tinnitus, bilateral: Secondary | ICD-10-CM | POA: Diagnosis not present

## 2018-10-21 DIAGNOSIS — H903 Sensorineural hearing loss, bilateral: Secondary | ICD-10-CM | POA: Diagnosis not present

## 2018-10-21 DIAGNOSIS — J310 Chronic rhinitis: Secondary | ICD-10-CM | POA: Diagnosis not present

## 2018-10-27 DIAGNOSIS — H26492 Other secondary cataract, left eye: Secondary | ICD-10-CM | POA: Diagnosis not present

## 2018-10-27 DIAGNOSIS — H353134 Nonexudative age-related macular degeneration, bilateral, advanced atrophic with subfoveal involvement: Secondary | ICD-10-CM | POA: Diagnosis not present

## 2018-10-27 DIAGNOSIS — H43811 Vitreous degeneration, right eye: Secondary | ICD-10-CM | POA: Diagnosis not present

## 2018-10-27 DIAGNOSIS — H43812 Vitreous degeneration, left eye: Secondary | ICD-10-CM | POA: Diagnosis not present

## 2018-12-21 DIAGNOSIS — M205X1 Other deformities of toe(s) (acquired), right foot: Secondary | ICD-10-CM | POA: Diagnosis not present

## 2018-12-21 DIAGNOSIS — M7751 Other enthesopathy of right foot: Secondary | ICD-10-CM | POA: Diagnosis not present

## 2018-12-21 DIAGNOSIS — L602 Onychogryphosis: Secondary | ICD-10-CM | POA: Diagnosis not present

## 2018-12-21 DIAGNOSIS — M21611 Bunion of right foot: Secondary | ICD-10-CM | POA: Diagnosis not present

## 2019-01-04 DIAGNOSIS — M21611 Bunion of right foot: Secondary | ICD-10-CM | POA: Diagnosis not present

## 2019-01-04 DIAGNOSIS — M7751 Other enthesopathy of right foot: Secondary | ICD-10-CM | POA: Diagnosis not present

## 2019-01-04 DIAGNOSIS — M205X1 Other deformities of toe(s) (acquired), right foot: Secondary | ICD-10-CM | POA: Diagnosis not present

## 2019-01-08 DIAGNOSIS — L570 Actinic keratosis: Secondary | ICD-10-CM | POA: Diagnosis not present

## 2019-01-08 DIAGNOSIS — D229 Melanocytic nevi, unspecified: Secondary | ICD-10-CM | POA: Diagnosis not present

## 2019-01-08 DIAGNOSIS — D1801 Hemangioma of skin and subcutaneous tissue: Secondary | ICD-10-CM | POA: Diagnosis not present

## 2019-01-08 DIAGNOSIS — L821 Other seborrheic keratosis: Secondary | ICD-10-CM | POA: Diagnosis not present

## 2019-01-22 DIAGNOSIS — R3914 Feeling of incomplete bladder emptying: Secondary | ICD-10-CM | POA: Diagnosis not present

## 2019-01-22 DIAGNOSIS — N139 Obstructive and reflux uropathy, unspecified: Secondary | ICD-10-CM | POA: Diagnosis not present

## 2019-01-22 DIAGNOSIS — N401 Enlarged prostate with lower urinary tract symptoms: Secondary | ICD-10-CM | POA: Diagnosis not present

## 2019-02-05 DIAGNOSIS — Z23 Encounter for immunization: Secondary | ICD-10-CM | POA: Diagnosis not present

## 2019-03-17 ENCOUNTER — Telehealth: Payer: Self-pay | Admitting: Cardiovascular Disease

## 2019-03-17 NOTE — Telephone Encounter (Signed)
New message      Patient calling the office for samples of medication:   1.  What medication and dosage are you requesting samples for?  xarelto 25mg   2.  Are you currently out of this medication? Almost out

## 2019-03-17 NOTE — Telephone Encounter (Signed)
Gave pt samples

## 2019-03-22 DIAGNOSIS — I739 Peripheral vascular disease, unspecified: Secondary | ICD-10-CM | POA: Diagnosis not present

## 2019-03-22 DIAGNOSIS — L602 Onychogryphosis: Secondary | ICD-10-CM | POA: Diagnosis not present

## 2019-03-22 DIAGNOSIS — L84 Corns and callosities: Secondary | ICD-10-CM | POA: Diagnosis not present

## 2019-04-21 ENCOUNTER — Other Ambulatory Visit: Payer: Self-pay | Admitting: Cardiovascular Disease

## 2019-04-21 ENCOUNTER — Other Ambulatory Visit: Payer: Self-pay

## 2019-04-21 DIAGNOSIS — I480 Paroxysmal atrial fibrillation: Secondary | ICD-10-CM

## 2019-04-21 NOTE — Telephone Encounter (Signed)
Called and spoke w/pt about needed cmp. Pt voiced understanding and agreed to come in tomorrow

## 2019-04-22 DIAGNOSIS — I480 Paroxysmal atrial fibrillation: Secondary | ICD-10-CM | POA: Diagnosis not present

## 2019-04-22 LAB — COMPREHENSIVE METABOLIC PANEL
ALT: 14 IU/L (ref 0–44)
AST: 13 IU/L (ref 0–40)
Albumin/Globulin Ratio: 1.6 (ref 1.2–2.2)
Albumin: 4.2 g/dL (ref 3.7–4.7)
Alkaline Phosphatase: 143 IU/L — ABNORMAL HIGH (ref 39–117)
BUN/Creatinine Ratio: 21 (ref 10–24)
BUN: 40 mg/dL — ABNORMAL HIGH (ref 8–27)
Bilirubin Total: 0.3 mg/dL (ref 0.0–1.2)
CO2: 23 mmol/L (ref 20–29)
Calcium: 9.1 mg/dL (ref 8.6–10.2)
Chloride: 103 mmol/L (ref 96–106)
Creatinine, Ser: 1.87 mg/dL — ABNORMAL HIGH (ref 0.76–1.27)
GFR calc Af Amer: 39 mL/min/{1.73_m2} — ABNORMAL LOW (ref >59)
GFR calc non Af Amer: 34 mL/min/{1.73_m2} — ABNORMAL LOW (ref >59)
Globulin, Total: 2.6 g/dL (ref 1.5–4.5)
Glucose: 97 mg/dL (ref 65–99)
Potassium: 4.8 mmol/L (ref 3.5–5.2)
Sodium: 141 mmol/L (ref 134–144)
Total Protein: 6.8 g/dL (ref 6.0–8.5)

## 2019-04-27 ENCOUNTER — Encounter: Payer: Self-pay | Admitting: Cardiovascular Disease

## 2019-04-27 ENCOUNTER — Other Ambulatory Visit: Payer: Self-pay

## 2019-04-27 ENCOUNTER — Ambulatory Visit (INDEPENDENT_AMBULATORY_CARE_PROVIDER_SITE_OTHER): Payer: Medicare Other | Admitting: Cardiovascular Disease

## 2019-04-27 VITALS — BP 134/70 | HR 62 | Temp 97.1°F | Ht 68.5 in | Wt 162.0 lb

## 2019-04-27 DIAGNOSIS — I480 Paroxysmal atrial fibrillation: Secondary | ICD-10-CM

## 2019-04-27 DIAGNOSIS — N1832 Chronic kidney disease, stage 3b: Secondary | ICD-10-CM

## 2019-04-27 DIAGNOSIS — E039 Hypothyroidism, unspecified: Secondary | ICD-10-CM | POA: Diagnosis not present

## 2019-04-27 DIAGNOSIS — I10 Essential (primary) hypertension: Secondary | ICD-10-CM | POA: Diagnosis not present

## 2019-04-27 DIAGNOSIS — I341 Nonrheumatic mitral (valve) prolapse: Secondary | ICD-10-CM

## 2019-04-27 DIAGNOSIS — Z7901 Long term (current) use of anticoagulants: Secondary | ICD-10-CM | POA: Diagnosis not present

## 2019-04-27 MED ORDER — CHLORTHALIDONE 25 MG PO TABS: 25.0000 mg | ORAL_TABLET | ORAL | 11 refills | Status: DC

## 2019-04-27 MED ORDER — LOSARTAN POTASSIUM 25 MG PO TABS: 25.0000 mg | ORAL_TABLET | Freq: Two times a day (BID) | ORAL | 3 refills | Status: DC

## 2019-04-27 MED ORDER — AMIODARONE HCL 200 MG PO TABS: 200.0000 mg | ORAL_TABLET | Freq: Every day | ORAL | 3 refills | Status: DC

## 2019-04-27 NOTE — Patient Instructions (Addendum)
Medication Instructions:  Your physician recommends that you continue on your current medications as directed. Please refer to the Current Medication list given to you today.  CONTINUE YOUR AMIODARONE AT 200 MG ONCE A DAY  DECREASE YOUR CHLORTHALIDONE TO 12.5 MG BY MOUTH TWICE A WEEK  LOSARTAN  INSTRUCTIONS FOR A 50 MG TABLET: Take 25 mg (half a tablet) by mouth in the morning and 25 mg (half a tablet) by mouth in the evening   LOSARTAN INSTRUCTIONS FOR A 25 MG TABLET: Take 25 mg (one whole tablet) by mouth in the morning and 25 mg (one whole tablet) by mouth in the evening  *If you need a refill on your cardiac medications before your next appointment, please call your pharmacy*  Lab Work: Your physician recommends that you return for lab work in 2 weeks: [place]  If you have labs (blood work) drawn today and your tests are completely normal, you will receive your results only by: Marland Kitchen MyChart Message (if you have MyChart) OR . A paper copy in the mail If you have any lab test that is abnormal or we need to change your treatment, we will call you to review the results.  Testing/Procedures: NONE  Follow-Up: At [facility], you and your health needs are our priority.  As part of our continuing mission to provide you with exceptional heart care, we have created designated Provider Care Teams.  These Care Teams include your primary Cardiologist (physician) and Advanced Practice Providers (APPs -  Physician Assistants and Nurse Practitioners) who all work together to provide you with the care you need, when you need it.  Your next appointment:   4 month(s)  The format for your next appointment:   In Person  Provider:   You may see Shelva Majestic, MD or one of the following Advanced Practice Providers on your designated Care Team:    Almyra Deforest, PA-C  Fabian Sharp, PA-C or   Roby Lofts, [place]

## 2019-04-27 NOTE — Progress Notes (Signed)
Patient ID: Tanner Ho, male   DOB: 01/28/1941, 78 y.o.   MRN: [id number]    Primary M.D.: Dr. [name]  HPI: Tanner Ho is a 78 y.o. male who presents to the office today for a 12 month follow-up cardiology evaluation.  [name] has a history of mitral valve prolapse, hypertension, hyperlipidemia, hypothyroidism, as well as paroxysmal atrial fibrillation. He is on chronic Coumadin anticoagulation. His last documented atrial fibrillation episode was in October 2013. A nuclear perfusion study in October 2013 revealed normal perfusion imaging but he developed 1-2 mm of ST segment depression at peak stress test and the possibility of microvascular etiology leading to his ECG abnormalities. A cardiopulmonary met test on 09/17/2012 demonstarated a reduced peak maximum oxygen consumption at 58%. He had a suboptimal peak cardiovascular stress load making cardiovascular status interpretation indeterminate. He did have mild ventilation/perfusion mismatch suggesting impaired ulnar circulation plus minus increased dead space. He had a blunted chronotropic response to exercise. The test was limited by leg fatigue. When I saw him in the office subsequently I reduced his Cardizem from 240 to180 mg. At times, he notes a rare palpitation. He denies any awareness of breakthrough atrial fibrillation.   He presented to the emergency room in November 2014 with some vague chest pain. In retrospect, he feels this may have been GERD symptoms. Cardiac enzymes were negative. ECG was without changes. A nuclear perfusion study on 04/22/2013 was low risk and showed mild diaphragmatic attenuation but did not show a region of scar or ischemia, unchanged from his prior study of several years previously.  In 2015 he had noticed some dizziness and decreased energy   A cardiac monitor revealed episodes of PACs, bradycardia, with heart rates down to 48, but he also had a 6 beat run of wide-complex tachycardia at a rate of 131.   Remotely, he had developed breakthrough atrial fibrillation on amiodarone 100 mg and he has been on 100 mg, alternating with 200 mg daily.  On this most recent monitor, there were no episodes of recurrent AF.   He has been on amiodarone 100 mg daily, Norvasc 2.5 mg, losartan 50 mg bid, HCTZ 12.5 mg daily, Toprol-XL 12.5 mg daily, Crestor 5 mg and Coumadin.  He also has noticed rare episodes of vertigo for which he takes meclizine.    He also has been on Uroxatral to improve urinary flow. When I last saw him we had a lengthy discussion concerning warfarin versus new or anticoagulants.  He opted to switch to Xarelto and has been maintained on 15 mg daily based on his creatinine clearance.  He has tolerated this well without bleeding.  He was evaluated by Samara Snide the office on 12/27/2015 and had noticed an occasional irregularity of his heartbeat.  When she evaluated him, he was still in sinus rhythm without ectopy and there were no acute changes.  Presently continues to feel that his pulse has been stable.  He denies chest pain.  He denies presyncope or syncope.  When I saw him in follow-up, he was in sinus rhythm.  When I saw 1 week later for a follow-up cardiology evaluation, he was very stable.  He was denying any episodes of palpitations.  He is not required use of when necessary metoprolol.  His ECG revealed sinus bradycardia at 58 bpm.  He was unaware of any any heart rate irregularity. Dr. Shelia Media had checked his laboratory.  He denied any episodes of chest pain, presyncope or syncope.  He developed recurrent atrial fibrillation with a rapid ventricular response and presented to [facility] ER on March 15.  He was given metoprolol 5 mg intravenously which brought his heart rate down.  He went home after several hours.  The following morning he was advised to increase amiodarone from 100 mg to 200 mg daily.  That night, again he develop recurrent AF with rapid ventricular response and repeat presented to  the emergency room on 08/16/2016.  At this time.  He was given metoprolol IV 2.5 mg 2.  He also was advised to take metoprolol 50 mg twice a day.  On this increased beta blocker dose, his blood pressure became low and he called and spoke with Rosaria Ferries several days later.  His medications were adjusted and he was told to stop amlodipine, reduce losartan to 25 mg, and reduce metoprolol to 25 mg twice a day.  When I last saw him 3 weeks ago, he was in atrial fibrillation with ventricular rate in the 90s.  I recommended further titration of amiodarone and increase this to 200 mg twice a day.  Also is on levothyroxine at 50 g.  TSH was normal at 3.38.  He has stage III chronic kidney disease.  He underwent a 2-D echo Doppler study on 09/04/2016 and was still in atrial fibrillation at that time.  Ejection fraction was 55-60%.  There was mild late systolic prolapse of both leaflets with mild MR.  There was mild PA pressure elevation at 39 mm.  Aortic root dimension measured 39 mm.  On the following evening, he began to notice his heart rate reducing down into the upper 40s to 50s and his rhythm appeared more regular. He called the office and was advised to reduce his metoprolol dose.  He also a completely discontinue this altogether.  He now notes his pulse typically in the 60s.  His blood pressure has risen to the 140s. He presents for reevaluation.  When I  saw him in April 2018, he was still in atrial fibrillation.  I recommended he continue amiodarone 200 mg twice a day at least for the next month.  At that time, I recommended he defer undergoing his YAG laser surgery procedure until his atrial fibrillation was controlled.  When he was seen in May 2018 , he had converted to sinus rhythm and with  pharmacologic cardioversion, he develops sinus bradycardia with rates in the upper 40s.  His metoprolol dose was reduced and ultimately discontinued.  Over the past several months, he  continued to feel well.    When I saw him in September 2018, he was in normal sinus rhythm at 64 bpm.  With a creatinine of 1.78  I recommended that he decrease HCTZ to just 2 times per week depending upon his swelling and blood pressure.  He had called the office several weeks later complaining of increased heart rate irregularity and was seen on March 27, 2017.  His ECG revealed sinus rhythm at 71 bpm with occasional PACs occurring almost every 6 or 7 beats.  I recommended initiation of low-dose metoprolol at 12.5 twice a day.  Was to monitor his blood pressure.  He continued to be on Xarelto without bleeding.  There was no edema and reduced dose of HCTZ.  I last saw him in November 2019 and prior to that evaluation he had seen the pharmacist on several occasions.  Most recently he has been on amiodarone 200 mg daily, chlorthalidone 12.5 mg on Monday Wednesday and  Fridays, losartan 50 mg in the morning and 25 mg in the evening and apparently is no longer taking metoprolol.  He has felt well.  He denies chest pain shortness of breath or palpitations.  He does not believe he has had any recurrent A. fib over the past year.  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    Over the past year, he has felt fairly well.  He is unaware of any recurrent atrial fibrillation.  His swelling has significantly resolved on his regimen consisting of chlorthalidone 12.5 mg on Monday Wednesday and Friday.  He continues to be on amiodarone 200 mg daily.  He is on losartan 50 mg in the morning and 25 mg at night, and has a prescription for metoprolol tartrate but has not required use.  He has hypothyroidism on levothyroxine at 75 mcg.  He continues to be on rosuvastatin 5 mg for hyperlipidemia.  He is on Xarelto at 15 mg dosing daily.  He presents for yearly evaluation.   Past Medical History:  Diagnosis Date   Adrenal mass ([place])    Asthma    BPH (benign prostatic hyperplasia)    CKD (chronic kidney disease), stage  III    GERD (gastroesophageal reflux disease)    H/O cardiovascular stress test 03/2012   EKG with 1-2 mm ST segment depression but normal perfusion images. Followup metastases test reduced exercise effort and functional status and determinate for myocardial dysfunction   History of cardiac monitoring    a. 2015: cardiac monitor revealed episodes of PACs, bradycardia, with heart rates down to 48, 4 beat PSVT, 6 beat run of NSVT at a rate of 131. There was no recurrent afib.    Hyperlipidemia LDL goal < 100    Hypertension    Hypothyroidism    Macular degeneration    MVP (mitral valve prolapse)    on cardiac cath - not mentioned on 2014 echo.   NSVT (nonsustained ventricular tachycardia) ([facility])    PAF (paroxysmal atrial fibrillation) ([place])    last episode 03/2012   Pericarditis    Premature atrial contractions    Sinus bradycardia     Past Surgical History:  Procedure Laterality Date   ABDOMINAL VASCULAR ULTRASOUND EVALUATION  12/20/2008   mild non-obstructive plaque, abdominal aorta, with mild calcification at the bifurcation-no obstruction, no evidence for aneurysm of the abdominal aorta   CARDIAC CATHETERIZATION  01/04/1999   hyperdynamic LV function, probable angiographic mitral valve prolapse, systolic muscle bridging of the mid LAD without obstructive disease, systolic narrowing up to 65%   Cardiopulmonary MET-test  07/20/2012   reduced exercise effort and functional status, peak max O2 comsumption was only 58% of predicted, cardiovascular status was interpreted as indeterminate for myocardial dysfunction due to the suboptimal peak cardiovascular stress load.    Allergies  Allergen Reactions   Ciprofloxacin Other (See Comments)    Cannot take due to currently taking Amiodarone   Hytrin [Terazosin] Other (See Comments)    Syncopal event     Current Outpatient Medications  Medication Sig Dispense Refill   acetaminophen (TYLENOL 8 HOUR ARTHRITIS PAIN) 650 MG CR  tablet Take 650 mg by mouth every 8 (eight) hours as needed for pain.     alfuzosin (UROXATRAL) 10 MG 24 hr tablet Take 10 mg by mouth daily.     amiodarone (PACERONE) 200 MG tablet Take 1 tablet (200 mg total) by mouth daily. 90 tablet 3   [START ON 04/29/2019] chlorthalidone (  HYGROTON) 25 MG tablet Take 1 tablet (25 mg total) by mouth 2 (two) times a week. 30 tablet 11   Cholecalciferol (VITAMIN D-3 PO) Take 1 capsule by mouth daily.      finasteride (PROSCAR) 5 MG tablet Take 5 mg by mouth daily.     fish oil-omega-3 fatty acids 1000 MG capsule Take 1 g by mouth 2 (two) times daily.     Levothyroxine Sodium 75 MCG CAPS Take 75 mcg by mouth daily.   2   linaclotide (LINZESS) 145 MCG CAPS capsule Linzess 145 mcg capsule     loratadine (CLARITIN) 10 MG tablet Take 10 mg by mouth daily as needed for allergies.     losartan (COZAAR) 25 MG tablet Take 1 tablet (25 mg total) by mouth 2 (two) times daily. 90 tablet 3   metoprolol tartrate (LOPRESSOR) 25 MG tablet 1/2 tablet in the am and pm     Multiple Vitamins-Minerals (PRESERVISION AREDS) CAPS Take 1 capsule by mouth 2 (two) times daily. VITAMIN A FREE     Polyethyl Glycol-Propyl Glycol (SYSTANE) 0.4-0.3 % SOLN Apply 2 drops to eye daily as needed (for dry eeys).     rosuvastatin (CRESTOR) 5 MG tablet TAKE 1 TABLET BY MOUTH DAILY AT 6 PM. 30 tablet 11   Vitamin D, Ergocalciferol, (DRISDOL) 1.25 MG (50000 UT) CAPS capsule Take by mouth.     XARELTO 15 MG TABS tablet TAKE 1 TABLET (15 MG TOTAL) BY MOUTH DAILY WITH SUPPER. 30 tablet 1   Hypromellose 0.2 % SOLN Apply to eye.     NON FORMULARY Take by mouth.     NON FORMULARY Take by mouth.     omega-3 acid ethyl esters (LOVAZA) 1 g capsule Take by mouth.     No current facility-administered medications for this visit.     Social History   Socioeconomic History   Marital status: Married    Spouse name: Not on file   Number of children: Not on file   Years of education:  Not on file   Highest education level: Not on file  Occupational History   Not on file  Social Needs   Financial resource strain: Not on file   Food insecurity    Worry: Not on file    Inability: Not on file   Transportation needs    Medical: Not on file    Non-medical: Not on file  Tobacco Use   Smoking status: Never Smoker   Smokeless tobacco: Never Used  Substance and Sexual Activity   Alcohol use: No   Drug use: No   Sexual activity: Not on file  Lifestyle   Physical activity    Days per week: Not on file    Minutes per session: Not on file   Stress: Not on file  Relationships   Social connections    Talks on phone: Not on file    Gets together: Not on file    Attends religious service: Not on file    Active member of club or organization: Not on file    Attends meetings of clubs or organizations: Not on file    Relationship status: Not on file   Intimate partner violence    Fear of current or ex partner: Not on file    Emotionally abused: Not on file    Physically abused: Not on file    Forced sexual activity: Not on file  Other Topics Concern   Not on file  Social History Narrative  Not on file    Social history is notable in that he is married. 2 children 4 grandchildren. No tobacco alcohol use.  ROS General: Negative; No fevers, chills, or night sweats;  HEENT: Negative; No changes in vision or hearing, sinus congestion, difficulty swallowing Pulmonary: Negative; No cough, wheezing, shortness of breath, hemoptysis Cardiovascular: See history of present illness Edema has improved GI: Positive for GERD; No nausea, vomiting, diarrhea, or abdominal pain GU: Negative; No dysuria, hematuria, or difficulty voiding Musculoskeletal: Negative; no myalgias, joint pain, or weakness Hematologic/Oncology: Negative; no easy bruising, bleeding Endocrine: Negative; no heat/cold intolerance; no diabetes Neuro: Negative; no changes in balance,  headaches Skin: 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   PE BP 134/70    Pulse 62    Temp (!) 97.1 F (36.2 C)    Ht 5' 8.5" (1.74 m)    Wt 162 lb (73.5 kg)    SpO2 99%    BMI 24.27 kg/m    Repeat blood pressure by me 130/70  Wt Readings from Last 3 Encounters:  04/27/19 162 lb (73.5 kg)  04/27/18 158 lb 6.4 oz (71.8 kg)  10/14/17 161 lb (73 kg)   General: Alert, oriented, no distress.  Skin: normal turgor, no rashes, warm and dry HEENT: Normocephalic, atraumatic. Pupils equal round and reactive to light; sclera anicteric; extraocular muscles intact;  Nose without nasal septal hypertrophy Mouth/Parynx benign; Mallinpatti scale 3 Neck: No JVD, no carotid bruits; normal carotid upstroke Lungs: clear to ausculatation and percussion; no wheezing or rales Chest wall: without tenderness to palpitation Heart: PMI not displaced, RRR, s1 s2 normal, 1/6 systolic murmur, no diastolic murmur, no rubs, gallops, thrills, or heaves Abdomen: soft, nontender; no hepatosplenomehaly, BS+; abdominal aorta nontender and not dilated by palpation. Back: no CVA tenderness Pulses 2+ Musculoskeletal: full range of motion, normal strength, no joint deformities Extremities: Venous stasis to his lower extremities with trivial if any edema on the right and none on the left no clubbing cyanosis, Homan's sign negative  Neurologic: grossly nonfocal; Cranial nerves grossly wnl Psychologic: Normal mood and affect   ECG (independently read by me): Sinus bradycardia 58 bpm, left axis deviation, small nondiagnostic lateral Q waves  November 2019 ECG (independently read by me): Normal sinus rhythm at 61 bpm.  Nonspecific ST-T  changes.  No ectopy.  October 2018 ECG (independently read by me): Normal sinus rhythm at 71 bpm, occasional PACs every sixth beat, nonspecific ST changes.  QTc interval 473 ms.  September 2018 (independently read by me): Normal sinus rhythm at 64 bpm.  QTc interval 455 ms.  Possible left atrial enlargement.  Nonspecific ST-T changes.  May 2018 ECG (independently read by me): Sinus bradycardia 59 bpm.  Possible left atrial enlargement.  No ST segment changes.  QTc interval 463 ms.  April 2018 ECG (independently read by me): Normal sinus rhythm at 69 bpm.  PACs, nonspecific ST changes.  Normal intervals.  08/20/2016 ECG (independently read by me): Atrial fibrillation with ventricular rate at 96 bpm.  Nonspecific ST-T changes.  Lateral T-wave changes.  08/13/2016 ECG (independently read by me): Sinus bradycardia 58 bpm.  Nonspecific ST changes.  Normal intervals.  September 2017 ECG (independently read by me): Sinus rhythm at 68.  Normal intervals.  No significant ST-T changes.  December 2016 ECG (independently read by me): Normal sinus rhythm at 61 bpm.  No ectopy.  QTc interval 434 ms.  June 2016 ECG (independently read by me): Sinus bradycardia 52 bpm.  QTc interval 407 ms.  PR interval 158 msec  December 2015 ECG (independently read by me): Sinus bradycardia at 49 bpm.  Nonspecific ST changes.  QTc interval 424 ms.  ECG (independently read by me): Sinus bradycardia 50 beats per minute.  Nonspecific ST changes.    Prior ECG: Sinus bradycardia at 50 beats per minute. No ectopy on ECG. Intervals normal  LABS: BMP Latest Ref Rng & Units 04/22/2019 06/12/2017 01/15/2017  Glucose 65 - 99 mg/dL 97 91 83  BUN 8 - 27 mg/dL 40(H) 21 31(H)  Creatinine 0.76 - 1.27 mg/dL 1.87(H) 1.32(H) 1.78(H)  BUN/Creat Ratio 10 - _0 Sodium 134 - 144 mmol/L 141 136 137  Potassium 3.5 - 5.2 mmol/L 4.8 4.4 5.1  Chloride 96 - 106 mmol/L 103 98 95(L)  CO2 20 - 29 mmol/L _1 Calcium 8.6 - 10.2  mg/dL 9.1 9.3 9.1   Hepatic Function Latest Ref Rng & Units 04/22/2019 12/28/2014 01/05/2014  Total Protein 6.0 - 8.5 g/dL 6.8 6.2 6.7  Albumin 3.7 - 4.7 g/dL 4.2 3.7 4.4  AST 0 - 40 IU/L _2 ALT 0 - 44 IU/L _3 Alk Phosphatase 39 - 117 IU/L 143(H) 117(H) 107  Total Bilirubin 0.0 - 1.2 mg/dL 0.3 0.6 0.4   CBC Latest Ref Rng & Units 08/15/2016 12/27/2015 12/28/2014  WBC 4.0 - 10.5 K/uL 5.6 5.0 4.7  Hemoglobin 13.0 - 17.0 g/dL 11.9(L) 13.2 13.3  Hematocrit 39.0 - 52.0 % 34.9(L) 38.6 39.5  Platelets 150 - 400 K/uL 156 158 173   Lab Results  Component Value Date   MCV 100.6 (H) 08/15/2016   MCV 100.5 (H) 12/27/2015   MCV 100.0 12/28/2014   Lab Results  Component Value Date   TSH 3.38 08/20/2016   Lipid Panel     Component Value Date/Time   CHOL 120 (L) 12/28/2014 0924   TRIG 85 12/28/2014 0924   HDL 49 12/28/2014 0924   CHOLHDL 2.4 12/28/2014 0924   VLDL 17 12/28/2014 0924   LDLCALC 54 12/28/2014 0924     RADIOLOGY: No results found.  IMPRESSION:  1. Essential hypertension   2. PAF (paroxysmal atrial fibrillation), recurrent 03/12/12   3. Stage 3b chronic kidney disease   4. Chronic anticoagulation   5. Hypothyroidism, unspecified type   6. MVP (mitral valve prolapse)     ASSESSMENT AND PLAN: Ms. Shurley is a 78 year old gentleman who has a history of hypertension, hyperlipidemia, hypothyroidism, mitral valve prolapse, as well as paroxysmal atrial fibrillation. He had been maintaining sinus rhythm and was without recurrent atrial fibrillation until July 2017 when he developed recurrent atrial fibrillation with rapid ventricular response and was evaluated on 2 consecutive evenings in the emergency room where he was treated with IV metoprolol for rate slowing. He developed recurrent atrial fibrillation in April 2018 and ultimately pharmacologically cardioverted back to sinus rhythm.  When I last saw him, he was having some PACs and I initiated low-dose beta-blocker  therapy.  Over the past several years he has been maintaining sinus rhythm and has been without recurrent atrial fibrillation.  His blood pressure today is stable and he has been taking chlorthalidone 12.5 mg on Monday Wednesday and Friday and losartan 50 mg in the morning and  25 mg at night.  He underwent repeat laboratory 5 days ago.  His BUN had increased to 40 and creatinine 1.87 from 1 year ago when it was 21/1.32.  I have recommended he decrease losartan to 25 mg twice a day and decrease chlorthalidone to at minimum twice per week but if he does not have any swelling it may be possible to discontinue diuretic altogether.  He will undergo a follow-up be met in 1 to 2 weeks for reassessment of renal function.  With his creatinine clearance at 34, he is on reduced dose of Xarelto at 15 mg.  He inquired about Eliquis.  At this point I did not recommend changing to Eliquis since his renal function may be in flux but this can be reassessed at a later date.  He is not having any chest pain.  He continues to be on levothyroxine 75 mcg for hypothyroidism.  He continues to be on rosuvastatin 5 mg for hyperlipidemia.  Dr. Junius Roads checks his basic laboratory.  He will be seeing Dr. Shelia Media in 4 to 6 weeks.  I will see him in 4 months for reevaluation.    Time spent: 25 minute Troy Sine, MD, [facility]  04/27/2019 5:54 PM

## 2019-04-28 ENCOUNTER — Telehealth: Payer: Self-pay | Admitting: Cardiovascular Disease

## 2019-04-28 MED ORDER — CHLORTHALIDONE 25 MG PO TABS: 12.5000 mg | ORAL_TABLET | ORAL | 11 refills | Status: DC

## 2019-04-28 NOTE — Telephone Encounter (Signed)
New Message  Pt c/o medication issue:  1. Name of Medication: chlorthalidone (HYGROTON) 25 MG tablet  2. How are you currently taking this medication (dosage and times per day)? As directed  3. Are you having a reaction (difficulty breathing--STAT)?  No  4. What is your medication issue? Patient's wife is calling in to follow up about medication change. States that patient was told that his medication would be decreasing but it has been increased instead. Please give patient/patient's wife a call to discuss.

## 2019-04-28 NOTE — Telephone Encounter (Signed)
Called and spoke to wife-  Originally the patient was on 12.5 mg three times a week of chlorthalidone (Monday, Wednesday, and Friday)= 37.5 mg overall. She states at visit yesterday he was to decrease to twice a week- but the medication was sent at 25 mg twice a week which would be 50 mg= which is not a decrease in medication but an increase. Should he do the 12.5 mg twice a week?   Also- she wanted to make sure Dr.Kelly was aware that the patient has been taking amiodarone 200 mg daily for a while now- instructions say to decrease to once daily, but this is what he has been on for a while now-   I advised I would get clarification- will route to MD and to nurse covering him in office today.

## 2019-04-28 NOTE — Telephone Encounter (Signed)
Reviewed assessment and plan from 11/24 OV note for Dr. Claiborne Billings.   Pt was taking chlorthalidone 12.5 mg  M-W-F. Recommendation was to decrease to twice a week. AVS updated and Rx updated to reflect this and sent to pt pharmacy.  Pt past med list had Rx for amiodarone at 200 mg BID. This was adjusted to 200 mg daily on 11/24. Pt was already taking 200 mg daily. AVS updated to state 'continue' at 200 mg daily   Pt wife contacted. Informed her of the above and that updated AVS to be mailed to pt address.   AVS mailed to pt on 11/25

## 2019-04-28 NOTE — Addendum Note (Signed)
Addended by: Annita Brod on: 04/28/2019 05:19 PM   Modules accepted: Orders

## 2019-04-28 NOTE — Telephone Encounter (Signed)
Called wife back, went to voicemail-  Left message to call back, with call back number.

## 2019-05-11 ENCOUNTER — Telehealth: Payer: Self-pay | Admitting: Cardiovascular Disease

## 2019-05-11 DIAGNOSIS — I10 Essential (primary) hypertension: Secondary | ICD-10-CM | POA: Diagnosis not present

## 2019-05-11 LAB — BASIC METABOLIC PANEL
BUN/Creatinine Ratio: 18 (ref 10–24)
BUN: 36 mg/dL — ABNORMAL HIGH (ref 8–27)
CO2: 23 mmol/L (ref 20–29)
Calcium: 9 mg/dL (ref 8.6–10.2)
Chloride: 99 mmol/L (ref 96–106)
Creatinine, Ser: 2.03 mg/dL — ABNORMAL HIGH (ref 0.76–1.27)
GFR calc Af Amer: 35 mL/min/{1.73_m2} — ABNORMAL LOW (ref >59)
GFR calc non Af Amer: 30 mL/min/{1.73_m2} — ABNORMAL LOW (ref >59)
Glucose: 98 mg/dL (ref 65–99)
Potassium: 4.9 mmol/L (ref 3.5–5.2)
Sodium: 137 mmol/L (ref 134–144)

## 2019-05-11 NOTE — Telephone Encounter (Signed)
Patient dropped off note with BP readings since last visit on 04/27/2019:  04/28/19 @ 4pm BP 132/50 HR 58  04/30/19 @ 11am BP 114/40 HR 63  05/02/19 @ 11am BP 106/43 HR 63  05/03/19  BP 88/37 HR 66 @ 10:30am BP 100/41 HR 62 H 11:30am BP 122/46 HR 61 @ 2pm  05/04/19 @ 8am BP 131/49 HR 58  05/05/19 @ 9am BP 126/49 HR 59  05/06/19 @ 9:30am BP 106/45 HR 58  05/07/19 @ 2pm BP 117/45 HR 63  05/09/19 @ 5:30pm BP 120/52 HR 62  05/10/19 @ noon BP 116/46 HR 57

## 2019-05-12 NOTE — Telephone Encounter (Signed)
Blood pressure recordings look fairly stable with the exception of 10:30 AM reading on November 30.  I would continue current regimen.

## 2019-05-12 NOTE — Telephone Encounter (Signed)
LM on VM (per DPR) with MD review/notes concerning BP readings

## 2019-05-18 ENCOUNTER — Other Ambulatory Visit: Payer: Self-pay

## 2019-05-18 DIAGNOSIS — I10 Essential (primary) hypertension: Secondary | ICD-10-CM

## 2019-06-15 ENCOUNTER — Other Ambulatory Visit: Payer: Self-pay | Admitting: Cardiovascular Disease

## 2019-06-16 ENCOUNTER — Telehealth: Payer: Self-pay | Admitting: Cardiovascular Disease

## 2019-06-16 NOTE — Telephone Encounter (Signed)
We are recommending the COVID-19 vaccine to all of our patients. Cardiac medications (including blood thinners) should not deter anyone from being vaccinated and there is no need to hold any of those medications prior to vaccine administration.     Currently, there is a hotline to call (active 06/11/19) to schedule vaccination appointments as no walk-ins will be accepted.   Number: [phone]    If you have further questions or concerns about the vaccine process, please visit [email or web address] or contact your primary care physician.

## 2019-06-25 DIAGNOSIS — I739 Peripheral vascular disease, unspecified: Secondary | ICD-10-CM | POA: Diagnosis not present

## 2019-06-25 DIAGNOSIS — L602 Onychogryphosis: Secondary | ICD-10-CM | POA: Diagnosis not present

## 2019-07-09 DIAGNOSIS — L905 Scar conditions and fibrosis of skin: Secondary | ICD-10-CM | POA: Diagnosis not present

## 2019-07-09 DIAGNOSIS — L570 Actinic keratosis: Secondary | ICD-10-CM | POA: Diagnosis not present

## 2019-07-09 DIAGNOSIS — L821 Other seborrheic keratosis: Secondary | ICD-10-CM | POA: Diagnosis not present

## 2019-07-09 DIAGNOSIS — C44229 Squamous cell carcinoma of skin of left ear and external auricular canal: Secondary | ICD-10-CM | POA: Diagnosis not present

## 2019-07-09 DIAGNOSIS — L853 Xerosis cutis: Secondary | ICD-10-CM | POA: Diagnosis not present

## 2019-07-09 DIAGNOSIS — Z85828 Personal history of other malignant neoplasm of skin: Secondary | ICD-10-CM | POA: Diagnosis not present

## 2019-07-09 DIAGNOSIS — D485 Neoplasm of uncertain behavior of skin: Secondary | ICD-10-CM | POA: Diagnosis not present

## 2019-07-09 DIAGNOSIS — D692 Other nonthrombocytopenic purpura: Secondary | ICD-10-CM | POA: Diagnosis not present

## 2019-07-14 DIAGNOSIS — R3914 Feeling of incomplete bladder emptying: Secondary | ICD-10-CM | POA: Diagnosis not present

## 2019-07-14 DIAGNOSIS — N401 Enlarged prostate with lower urinary tract symptoms: Secondary | ICD-10-CM | POA: Diagnosis not present

## 2019-07-14 DIAGNOSIS — N189 Chronic kidney disease, unspecified: Secondary | ICD-10-CM | POA: Diagnosis not present

## 2019-07-25 ENCOUNTER — Ambulatory Visit: Payer: Medicare Other | Attending: Internal Medicine

## 2019-07-25 DIAGNOSIS — Z23 Encounter for immunization: Secondary | ICD-10-CM | POA: Insufficient documentation

## 2019-07-25 NOTE — Progress Notes (Signed)
   Covid-19 Vaccination Clinic  Name:  Tanner Ho    MRN: [id number] DOB: 12/02/1940  07/25/2019  [name] was observed post Covid-19 immunization for 15 minutes without incidence. He was provided with Vaccine Information Sheet and instruction to access the V-Safe system.   [name] was instructed to call 911 with any severe reactions post vaccine: Marland Kitchen Difficulty breathing  . Swelling of your face and throat  . A fast heartbeat  . A bad rash all over your body  . Dizziness and weakness    Immunizations Administered    Name Date Dose VIS Date Route   Pfizer COVID-19 Vaccine 07/25/2019 10:27 AM 0.3 mL 05/14/2019 Intramuscular   Manufacturer: [place]   Lot: [id number]   [place]: [id number]

## 2019-07-27 ENCOUNTER — Other Ambulatory Visit: Payer: Self-pay

## 2019-07-27 DIAGNOSIS — I10 Essential (primary) hypertension: Secondary | ICD-10-CM

## 2019-07-28 LAB — BASIC METABOLIC PANEL
BUN/Creatinine Ratio: 19 (ref 10–24)
BUN: 35 mg/dL — ABNORMAL HIGH (ref 8–27)
CO2: 24 mmol/L (ref 20–29)
Calcium: 9 mg/dL (ref 8.6–10.2)
Chloride: 93 mmol/L — ABNORMAL LOW (ref 96–106)
Creatinine, Ser: 1.8 mg/dL — ABNORMAL HIGH (ref 0.76–1.27)
GFR calc Af Amer: 40 mL/min/{1.73_m2} — ABNORMAL LOW (ref >59)
GFR calc non Af Amer: 35 mL/min/{1.73_m2} — ABNORMAL LOW (ref >59)
Glucose: 94 mg/dL (ref 65–99)
Potassium: 5.3 mmol/L — ABNORMAL HIGH (ref 3.5–5.2)
Sodium: 131 mmol/L — ABNORMAL LOW (ref 134–144)

## 2019-08-02 ENCOUNTER — Telehealth: Payer: Self-pay | Admitting: Cardiovascular Disease

## 2019-08-02 NOTE — Telephone Encounter (Signed)
Patient's wife is requesting to come with patient to his appt tomorrow with Dr. Claiborne Billings due to vision impairment.

## 2019-08-02 NOTE — Telephone Encounter (Signed)
Contacted patients wife- advised okay for her to come to appointment due to her being his caregiver.  Will notify MD's nurse.

## 2019-08-03 ENCOUNTER — Ambulatory Visit: Payer: Medicare Other | Admitting: Cardiovascular Disease

## 2019-08-03 NOTE — Telephone Encounter (Signed)
agree

## 2019-08-06 ENCOUNTER — Telehealth: Payer: Self-pay

## 2019-08-06 DIAGNOSIS — Z79899 Other long term (current) drug therapy: Secondary | ICD-10-CM

## 2019-08-06 NOTE — Telephone Encounter (Signed)
Order for BMET placed. Will mail lab slip.

## 2019-08-11 DIAGNOSIS — L309 Dermatitis, unspecified: Secondary | ICD-10-CM | POA: Diagnosis not present

## 2019-08-12 DIAGNOSIS — H548 Legal blindness, as defined in USA: Secondary | ICD-10-CM | POA: Diagnosis not present

## 2019-08-12 DIAGNOSIS — H353134 Nonexudative age-related macular degeneration, bilateral, advanced atrophic with subfoveal involvement: Secondary | ICD-10-CM | POA: Diagnosis not present

## 2019-08-12 DIAGNOSIS — H04123 Dry eye syndrome of bilateral lacrimal glands: Secondary | ICD-10-CM | POA: Diagnosis not present

## 2019-08-12 DIAGNOSIS — H524 Presbyopia: Secondary | ICD-10-CM | POA: Diagnosis not present

## 2019-08-16 ENCOUNTER — Ambulatory Visit: Payer: Medicare Other | Attending: Internal Medicine

## 2019-08-16 DIAGNOSIS — Z23 Encounter for immunization: Secondary | ICD-10-CM

## 2019-08-16 NOTE — Progress Notes (Signed)
   Covid-19 Vaccination Clinic  Name:  Tanner Ho    MRN: [id number] DOB: May 30, 1941  08/16/2019  Tanner Ho was observed post Covid-19 immunization for 15 minutes without incident. He was provided with Vaccine Information Sheet and instruction to access the V-Safe system.   Tanner Ho was instructed to call 911 with any severe reactions post vaccine: Marland Kitchen Difficulty breathing  . Swelling of face and throat  . A fast heartbeat  . A bad rash all over body  . Dizziness and weakness   Immunizations Administered    Name Date Dose VIS Date Route   Pfizer COVID-19 Vaccine 08/16/2019 11:17 AM 0.3 mL 05/14/2019 Intramuscular   Manufacturer: [place]   Lot: [id number]   [place]: [id number]

## 2019-08-17 ENCOUNTER — Ambulatory Visit: Payer: Medicare Other | Admitting: Cardiovascular Disease

## 2019-08-19 DIAGNOSIS — Z79899 Other long term (current) drug therapy: Secondary | ICD-10-CM | POA: Diagnosis not present

## 2019-08-19 LAB — BASIC METABOLIC PANEL
BUN/Creatinine Ratio: 17 (ref 10–24)
BUN: 30 mg/dL — ABNORMAL HIGH (ref 8–27)
CO2: 23 mmol/L (ref 20–29)
Calcium: 9.1 mg/dL (ref 8.6–10.2)
Chloride: 98 mmol/L (ref 96–106)
Creatinine, Ser: 1.75 mg/dL — ABNORMAL HIGH (ref 0.76–1.27)
GFR calc Af Amer: 42 mL/min/{1.73_m2} — ABNORMAL LOW (ref >59)
GFR calc non Af Amer: 36 mL/min/{1.73_m2} — ABNORMAL LOW (ref >59)
Glucose: 110 mg/dL — ABNORMAL HIGH (ref 65–99)
Potassium: 4.5 mmol/L (ref 3.5–5.2)
Sodium: 137 mmol/L (ref 134–144)

## 2019-08-20 ENCOUNTER — Encounter: Payer: Self-pay | Admitting: Cardiovascular Disease

## 2019-08-20 ENCOUNTER — Other Ambulatory Visit: Payer: Self-pay

## 2019-08-20 ENCOUNTER — Ambulatory Visit (INDEPENDENT_AMBULATORY_CARE_PROVIDER_SITE_OTHER): Payer: Medicare Other | Admitting: Cardiovascular Disease

## 2019-08-20 VITALS — BP 144/56 | HR 57 | Ht 69.0 in | Wt 162.2 lb

## 2019-08-20 DIAGNOSIS — R600 Localized edema: Secondary | ICD-10-CM

## 2019-08-20 DIAGNOSIS — N1832 Chronic kidney disease, stage 3b: Secondary | ICD-10-CM

## 2019-08-20 DIAGNOSIS — I10 Essential (primary) hypertension: Secondary | ICD-10-CM

## 2019-08-20 DIAGNOSIS — Z9189 Other specified personal risk factors, not elsewhere classified: Secondary | ICD-10-CM | POA: Diagnosis not present

## 2019-08-20 DIAGNOSIS — Z7901 Long term (current) use of anticoagulants: Secondary | ICD-10-CM

## 2019-08-20 DIAGNOSIS — I480 Paroxysmal atrial fibrillation: Secondary | ICD-10-CM | POA: Diagnosis not present

## 2019-08-20 DIAGNOSIS — Z79899 Other long term (current) drug therapy: Secondary | ICD-10-CM

## 2019-08-20 DIAGNOSIS — R001 Bradycardia, unspecified: Secondary | ICD-10-CM | POA: Diagnosis not present

## 2019-08-20 DIAGNOSIS — E039 Hypothyroidism, unspecified: Secondary | ICD-10-CM | POA: Diagnosis not present

## 2019-08-20 NOTE — Telephone Encounter (Signed)
Pt notified-ok for wife

## 2019-08-20 NOTE — Progress Notes (Signed)
Patient ID: Tanner Ho, male   DOB: Feb 17, 1941, 79 y.o.   MRN: [id number]    Primary M.D.: Tanner Ho  HPI: Tanner Ho is a 79 y.o. male who presents to the office today for a 4 month follow-up cardiology evaluation.  Tanner Ho has a history of mitral valve prolapse, hypertension, hyperlipidemia, hypothyroidism, as well as paroxysmal atrial fibrillation. He is on chronic Coumadin anticoagulation. His last documented atrial fibrillation episode was in October 2013. A nuclear perfusion study in October 2013 revealed normal perfusion imaging but he developed 1-2 mm of ST segment depression at peak stress test and the possibility of microvascular etiology leading to his ECG abnormalities. A cardiopulmonary met test on 09/17/2012 demonstarated a reduced peak maximum oxygen consumption at 58%. He had a suboptimal peak cardiovascular stress load making cardiovascular status interpretation indeterminate. He did have mild ventilation/perfusion mismatch suggesting impaired ulnar circulation plus minus increased dead space. He had a blunted chronotropic response to exercise. The test was limited by leg fatigue. When I saw him in the office subsequently I reduced his Cardizem from 240 to180 mg. At times, he notes a rare palpitation. He denies any awareness of breakthrough atrial fibrillation.   He presented to the emergency room in November 2014 with some vague chest pain. In retrospect, he feels this may have been GERD symptoms. Cardiac enzymes were negative. ECG was without changes. A nuclear perfusion study on 04/22/2013 was low risk and showed mild diaphragmatic attenuation but did not show a region of scar or ischemia, unchanged from his prior study of several years previously.  In 2015 he had noticed some dizziness and decreased energy   A cardiac monitor revealed episodes of PACs, bradycardia, with heart rates down to 48, but he also had a 6 beat run of wide-complex tachycardia at a rate of 131.   Remotely, he had developed breakthrough atrial fibrillation on amiodarone 100 mg and he has been on 100 mg, alternating with 200 mg daily.  On this most recent monitor, there were no episodes of recurrent AF.   He has been on amiodarone 100 mg daily, Norvasc 2.5 mg, losartan 50 mg bid, HCTZ 12.5 mg daily, Toprol-XL 12.5 mg daily, Crestor 5 mg and Coumadin.  He also has noticed rare episodes of vertigo for which he takes meclizine.    He also has been on Uroxatral to improve urinary flow. When I last saw him we had a lengthy discussion concerning warfarin versus new or anticoagulants.  He opted to switch to Xarelto and has been maintained on 15 mg daily based on his creatinine clearance.  He has tolerated this well without bleeding.  He was evaluated by Tanner Ho the office on 12/27/2015 and had noticed an occasional irregularity of his heartbeat.  When she evaluated him, he was still in sinus rhythm without ectopy and there were no acute changes.  Presently continues to feel that his pulse has been stable.  He denies chest pain.  He denies presyncope or syncope.  When I saw him in follow-up, he was in sinus rhythm.  When I saw 1 week later for a follow-up cardiology evaluation, he was very stable.  He was denying any episodes of palpitations.  He is not required use of when necessary metoprolol.  His ECG revealed sinus bradycardia at 58 bpm.  He was unaware of any any heart rate irregularity. Tanner Ho had checked his laboratory.  He denied any episodes of chest pain, presyncope or syncope.  He developed recurrent atrial fibrillation with a rapid ventricular response and presented to [facility] ER on March 15.  He was given metoprolol 5 mg intravenously which brought his heart rate down.  He went home after several hours.  The following morning he was advised to increase amiodarone from 100 mg to 200 mg daily.  That night, again he develop recurrent AF with rapid ventricular response and repeat presented to the  emergency room on 08/16/2016.  At this time.  He was given metoprolol IV 2.5 mg 2.  He also was advised to take metoprolol 50 mg twice a day.  On this increased beta blocker dose, his blood pressure became low and he called and spoke with Tanner Ho several days later.  His medications were adjusted and he was told to stop amlodipine, reduce losartan to 25 mg, and reduce metoprolol to 25 mg twice a day.  When I last saw him 3 weeks ago, he was in atrial fibrillation with ventricular rate in the 90s.  I recommended further titration of amiodarone and increase this to 200 mg twice a day.  Also is on levothyroxine at 50 g.  TSH was normal at 3.38.  He has stage III chronic kidney disease.  He underwent a 2-D echo Doppler study on 09/04/2016 and was still in atrial fibrillation at that time.  Ejection fraction was 55-60%.  There was mild late systolic prolapse of both leaflets with mild MR.  There was mild PA pressure elevation at 39 mm.  Aortic root dimension measured 39 mm.  On the following evening, he began to notice his heart rate reducing down into the upper 40s to 50s and his rhythm appeared more regular. He called the office and was advised to reduce his metoprolol dose.  He also a completely discontinue this altogether.  He now notes his pulse typically in the 60s.  His blood pressure has risen to the 140s. He presents for reevaluation.  When I  saw him in April 2018, he was still in atrial fibrillation.  I recommended he continue amiodarone 200 mg twice a day at least for the next month.  At that time, I recommended he defer undergoing his YAG laser surgery procedure until his atrial fibrillation was controlled.  When he was seen in May 2018 , he had converted to sinus rhythm and with  pharmacologic cardioversion, he develops sinus bradycardia with rates in the upper 40s.  His metoprolol dose was reduced and ultimately discontinued.  Over the past several months, he  continued to feel well.   When I  saw him in September 2018, he was in normal sinus rhythm at 64 bpm.  With a creatinine of 1.78  I recommended that he decrease HCTZ to just 2 times per week depending upon his swelling and blood pressure.  He had called the office several weeks later complaining of increased heart rate irregularity and was seen on March 27, 2017.  His ECG revealed sinus rhythm at 71 bpm with occasional PACs occurring almost every 6 or 7 beats.  I recommended initiation of low-dose metoprolol at 12.5 twice a day.  Was to monitor his blood pressure.  He continued to be on Xarelto without bleeding.  There was no edema and reduced dose of HCTZ.  I  saw him in November 2019 and prior to that evaluation he had seen the pharmacist on several occasions.  Most recently he has been on amiodarone 200 mg daily, chlorthalidone 12.5 mg on Monday Wednesday and  Fridays, losartan 50 mg in the morning and 25 mg in the evening and apparently is no longer taking metoprolol.  He has felt well.  He denies chest pain shortness of breath or palpitations.  He does not believe he has had any recurrent A. fib over the past year.  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    I last saw him in November 2020 and over the previous year, he has felt fairly well.  He was unaware of any recurrent atrial fibrillation.  His swelling has significantly resolved on his regimen consisting of chlorthalidone 12.5 mg on Monday Wednesday and Friday.  He continues to be on amiodarone 200 mg daily.  He is on losartan 50 mg in the morning and 25 mg at night, and has a prescription for metoprolol tartrate but has not required use.  He has hypothyroidism on levothyroxine at 75 mcg.  He continues to be on rosuvastatin 5 mg for hyperlipidemia.  He is on Xarelto at 15 mg dosing daily.  He had undergone laboratory 5 days prior to his evaluation and with his BUN increasing to 40 and creatinine to 1.87 from 1 year ago I recommended he decrease losartan  to 25 mg twice a day and decrease chlorthalidone to at minimum twice per week.  His reduced renal function he was on a reduced dose of Xarelto at 15 mg.  Since I last saw him, repeat laboratory on February 23 showed a creatinine of 1.8, and losartan was further reduced to 25 mg daily.  More recent lab on August 19, 2019 showed slight improvement in creatinine at 1.75 with a BUN improved at 30 with estimated GFR at 36 consistent with stage IIIb chronic kidney disease.  He continues to experience mild edema of his ankles.  At times his heart rate has gotten down into the low 40s and he has continued to be on amiodarone 200 mg daily.  He denies any chest pain.  He is unaware of any palpitations.  He continues to take rosuvastatin and Lovaza for mixed hyperlipidemia.  He presents for reevaluation.  Past Medical History:  Diagnosis Date  . Adrenal mass ([place])   . Asthma   . BPH (benign prostatic hyperplasia)   . CKD (chronic kidney disease), stage III   . GERD (gastroesophageal reflux disease)   . H/O cardiovascular stress test 03/2012   EKG with 1-2 mm ST segment depression but normal perfusion images. Followup metastases test reduced exercise effort and functional status and determinate for myocardial dysfunction  . History of cardiac monitoring    a. 2015: cardiac monitor revealed episodes of PACs, bradycardia, with heart rates down to 48, 4 beat PSVT, 6 beat run of NSVT at a rate of 131. There was no recurrent afib.   Marland Kitchen Hyperlipidemia LDL goal < 100   . Hypertension   . Hypothyroidism   . Macular degeneration   . MVP (mitral valve prolapse)    on cardiac cath - not mentioned on 2014 echo.  Marland Kitchen NSVT (nonsustained ventricular tachycardia) ([place])   . PAF (paroxysmal atrial fibrillation) ([place])    last episode 03/2012  . Pericarditis   . Premature atrial contractions   . Sinus bradycardia     Past Surgical History:  Procedure Laterality Date  . ABDOMINAL VASCULAR ULTRASOUND EVALUATION  12/20/2008    mild non-obstructive plaque, abdominal aorta, with mild calcification at the bifurcation-no obstruction, no evidence for aneurysm of the abdominal aorta  . CARDIAC  CATHETERIZATION  01/04/1999   hyperdynamic LV function, probable angiographic mitral valve prolapse, systolic muscle bridging of the mid LAD without obstructive disease, systolic narrowing up to 40%  . Cardiopulmonary MET-test  07/20/2012   reduced exercise effort and functional status, peak max O2 comsumption was only 58% of predicted, cardiovascular status was interpreted as indeterminate for myocardial dysfunction due to the suboptimal peak cardiovascular stress load.    Allergies  Allergen Reactions  . Ciprofloxacin Other (See Comments)    Cannot take due to currently taking Amiodarone  . Hytrin [Terazosin] Other (See Comments)    Syncopal event     Current Outpatient Medications  Medication Sig Dispense Refill  . acetaminophen (TYLENOL 8 HOUR ARTHRITIS PAIN) 650 MG CR tablet Take 650 mg by mouth every 8 (eight) hours as needed for pain.    Marland Kitchen alfuzosin (UROXATRAL) 10 MG 24 hr tablet Take 10 mg by mouth daily.    Marland Kitchen amiodarone (PACERONE) 200 MG tablet Take 1 tablet (200 mg total) by mouth daily. 90 tablet 3  . chlorthalidone (HYGROTON) 25 MG tablet Take 0.5 tablets (12.5 mg total) by mouth 2 (two) times a week. 30 tablet 11  . Cholecalciferol (VITAMIN D-3 PO) Take 1 capsule by mouth daily.     . finasteride (PROSCAR) 5 MG tablet Take 5 mg by mouth daily.    . fish oil-omega-3 fatty acids 1000 MG capsule Take 1 g by mouth 2 (two) times daily.    . Hypromellose 0.2 % SOLN Apply to eye.    . Levothyroxine Sodium 75 MCG CAPS Take 75 mcg by mouth daily.   2  . linaclotide (LINZESS) 145 MCG CAPS capsule Linzess 145 mcg capsule    . loratadine (CLARITIN) 10 MG tablet Take 10 mg by mouth daily as needed for allergies.    Marland Kitchen losartan (COZAAR) 25 MG tablet Take 1 tablet (25 mg total) by mouth 2 (two) times daily. (Patient taking  differently: Take 25 mg by mouth daily. ) 90 tablet 3  . metoprolol tartrate (LOPRESSOR) 25 MG tablet 1/2 tablet in the am and pm    . Multiple Vitamins-Minerals (PRESERVISION AREDS) CAPS Take 1 capsule by mouth 2 (two) times daily. VITAMIN A FREE    . NON FORMULARY Take by mouth.    . NON FORMULARY Take by mouth.    . omega-3 acid ethyl esters (LOVAZA) 1 g capsule Take by mouth.    Vladimir Faster Glycol-Propyl Glycol (SYSTANE) 0.4-0.3 % SOLN Apply 2 drops to eye daily as needed (for dry eeys).    . rosuvastatin (CRESTOR) 5 MG tablet TAKE 1 TABLET BY MOUTH DAILY AT 6 PM. 30 tablet 11  . Vitamin D, Ergocalciferol, (DRISDOL) 1.25 MG (50000 UT) CAPS capsule Take by mouth.    Alveda Reasons 15 MG TABS tablet TAKE 1 TABLET (15 MG TOTAL) BY MOUTH DAILY WITH SUPPER. 30 tablet 1  . hydrocortisone 2.5 % cream APPLY TO AFFECTED AREA(S) TWICE DAILY     No current facility-administered medications for this visit.    Social History   Socioeconomic History  . Marital status: Married    Spouse name: Not on file  . Number of children: Not on file  . Years of education: Not on file  . Highest education level: Not on file  Occupational History  . Not on file  Tobacco Use  . Smoking status: Never Smoker  . Smokeless tobacco: Never Used  Substance and Sexual Activity  . Alcohol use: No  . Drug use:  No  . Sexual activity: Not on file  Other Topics Concern  . Not on file  Social History Narrative  . Not on file   Social Determinants of Health   Financial Resource Strain:   . Difficulty of Paying Living Expenses:   Food Insecurity:   . Worried About Charity fundraiser in the Last Year:   . Arboriculturist in the Last Year:   Transportation Needs:   . Film/video editor (Medical):   Marland Kitchen Lack of Transportation (Non-Medical):   Physical Activity:   . Days of Exercise per Week:   . Minutes of Exercise per Session:   Stress:   . Feeling of Stress :   Social Connections:   . Frequency of  Communication with Friends and Family:   . Frequency of Social Gatherings with Friends and Family:   . Attends Religious Services:   . Active Member of Clubs or Organizations:   . Attends Archivist Meetings:   Marland Kitchen Marital Status:   Intimate Partner Violence:   . Fear of Current or Ex-Partner:   . Emotionally Abused:   Marland Kitchen Physically Abused:   . Sexually Abused:     Social history is notable in that he is married. 2 children 4 grandchildren. No tobacco alcohol use.  ROS General: Negative; No fevers, chills, or night sweats;  HEENT: Negative; No changes in vision or hearing, sinus congestion, difficulty swallowing Pulmonary: Negative; No cough, wheezing, shortness of breath, hemoptysis Cardiovascular: See history of present illness Edema has improved GI: Positive for GERD; No nausea, vomiting, diarrhea, or abdominal pain GU: Negative; No dysuria, hematuria, or difficulty voiding Musculoskeletal: Negative; no myalgias, joint pain, or weakness Hematologic/Oncology: Negative; no easy bruising, bleeding Endocrine: Negative; no heat/cold intolerance; no diabetes Neuro: Negative; no changes in balance, headaches Skin: 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   PE BP (!) 144/56   Pulse (!) 57   Ht '5\' 9"'  (1.753 m)   Wt 162 lb 3.2 oz (73.6 kg)   SpO2 98%   BMI 23.95 kg/m    Repeat blood pressure was 142/62 when taken by me  Wt Readings from Last 3 Encounters:  08/20/19 162 lb 3.2 oz (73.6 kg)  04/27/19 162 lb (73.5 kg)  04/27/18 158 lb 6.4 oz (71.8 kg)   General: Alert, oriented, no distress.  Skin: normal turgor, no rashes, warm and dry HEENT: Normocephalic, atraumatic. Pupils equal  round and reactive to light; sclera anicteric; extraocular muscles intact;  Nose without nasal septal hypertrophy Mouth/Parynx benign; Mallinpatti scale Neck: No JVD, no carotid bruits; normal carotid upstroke Lungs: clear to ausculatation and percussion; no wheezing or rales Chest wall: without tenderness to palpitation Heart: PMI not displaced, RRR, s1 s2 normal, 1/6 systolic murmur, no diastolic murmur, no rubs, gallops, thrills, or heaves Abdomen: soft, nontender; no hepatosplenomehaly, BS+; abdominal aorta nontender and not dilated by palpation. Back: no CVA tenderness Pulses 2+ Musculoskeletal: full range of motion, normal strength, no joint deformities Extremities: no clubbing cyanosis or edema, Homan's sign negative  Neurologic: grossly nonfocal; Cranial nerves grossly wnl Psychologic: Normal mood and affect  ECG (independently read by me): Sinus bradycardia 57 bpm with PAC, nonspecific ST abnormality.  PR interval 180 ms, QTc interval 449 ms.  November 2020  ECG (independently read by me): Sinus bradycardia 58 bpm, left axis deviation, small nondiagnostic lateral Q waves  November 2019 ECG (independently read by me): Normal sinus rhythm at 61 bpm.  Nonspecific ST-T changes.  No ectopy.  October 2018 ECG (independently read by me): Normal sinus rhythm at 71 bpm, occasional PACs every sixth beat, nonspecific ST changes.  QTc interval 473 ms.  September 2018 (independently read by me): Normal sinus rhythm at 64 bpm.  QTc interval 455 ms.  Possible left atrial enlargement.  Nonspecific ST-T changes.  May 2018 ECG (independently read by me): Sinus bradycardia 59 bpm.  Possible left atrial enlargement.  No ST segment changes.  QTc interval 463 ms.  April 2018 ECG (independently read by me): Normal sinus rhythm at 69 bpm.  PACs, nonspecific ST changes.  Normal intervals.  08/20/2016 ECG (independently read by me): Atrial fibrillation with ventricular rate at 96 bpm.  Nonspecific ST-T  changes.  Lateral T-wave changes.  08/13/2016 ECG (independently read by me): Sinus bradycardia 58 bpm.  Nonspecific ST changes.  Normal intervals.  September 2017 ECG (independently read by me): Sinus rhythm at 68.  Normal intervals.  No significant ST-T changes.  December 2016 ECG (independently read by me): Normal sinus rhythm at 61 bpm.  No ectopy.  QTc interval 434 ms.  June 2016 ECG (independently read by me): Sinus bradycardia 52 bpm.  QTc interval 407 ms.  PR interval 158 msec  December 2015 ECG (independently read by me): Sinus bradycardia at 49 bpm.  Nonspecific ST changes.  QTc interval 424 ms.  ECG (independently read by me): Sinus bradycardia 50 beats per minute.  Nonspecific ST changes.    Prior ECG: Sinus bradycardia at 50 beats per minute. No ectopy on ECG. Intervals normal  LABS: BMP Latest Ref Rng & Units 08/19/2019 07/27/2019 05/11/2019  Glucose 65 - 99 mg/dL 110(H) 94 98  BUN 8 - 27 mg/dL 30(H) 35(H) 36(H)  Creatinine 0.76 - 1.27 mg/dL 1.75(H) 1.80(H) 2.03(H)  BUN/Creat Ratio 10 - '24 17 19 18  ' Sodium 134 - 144 mmol/L 137 131(L) 137  Potassium 3.5 - 5.2 mmol/L 4.5 5.3(H) 4.9  Chloride 96 - 106 mmol/L 98 93(L) 99  CO2 20 - 29 mmol/L '23 24 23  ' Calcium 8.6 - 10.2 mg/dL 9.1 9.0 9.0   Hepatic Function Latest Ref Rng & Units 04/22/2019 12/28/2014 01/05/2014  Total Protein 6.0 - 8.5 g/dL 6.8 6.2 6.7  Albumin 3.7 - 4.7 g/dL 4.2 3.7 4.4  AST 0 - 40 IU/L '13 17 15  ' ALT 0 - 44 IU/L '14 15 15  ' Alk Phosphatase 39 - 117 IU/L 143(H) 117(H) 107  Total Bilirubin 0.0 - 1.2 mg/dL 0.3 0.6 0.4   CBC Latest Ref Rng & Units 08/15/2016 12/27/2015 12/28/2014  WBC 4.0 - 10.5 K/uL 5.6 5.0 4.7  Hemoglobin 13.0 - 17.0 g/dL 11.9(L) 13.2 13.3  Hematocrit 39.0 - 52.0 % 34.9(L) 38.6 39.5  Platelets 150 - 400 K/uL 156 158 173   Lab Results  Component Value Date   MCV 100.6 (H) 08/15/2016   MCV 100.5 (H) 12/27/2015   MCV 100.0 12/28/2014   Lab Results  Component Value Date   TSH 3.38  08/20/2016   Lipid Panel     Component Value Date/Time   CHOL 120 (L) 12/28/2014 0924   TRIG 85 12/28/2014 0924   HDL 49 12/28/2014 0924   CHOLHDL 2.4 12/28/2014 0924   VLDL 17 12/28/2014 0924   LDLCALC 54 12/28/2014 1937  RADIOLOGY: No results found.  IMPRESSION:  1. PAF (paroxysmal atrial fibrillation) ([place])   2. Essential hypertension   3. Chronic anticoagulation   4. Stage 3b chronic kidney disease   5. Lower leg edema   6. Hypothyroidism, unspecified type   7. Sinus bradycardia   8. At risk for amiodarone toxicity with long term use     ASSESSMENT AND PLAN: Ms. Blumenfeld is a 79 year old gentleman who has a history of hypertension, hyperlipidemia, hypothyroidism, mitral valve prolapse, as well as paroxysmal atrial fibrillation. He had been maintaining sinus rhythm and was without recurrent atrial fibrillation until July 2017 when he developed recurrent atrial fibrillation with rapid ventricular response and was evaluated on 2 consecutive evenings in the emergency room where he was treated with IV metoprolol for rate slowing. He developed recurrent atrial fibrillation in April 2018 and ultimately pharmacologically cardioverted back to sinus rhythm.  When I last saw him, he was having some PACs and I initiated low-dose beta-blocker therapy.  Over the past several years he has been maintaining sinus rhythm and has been without recurrent atrial fibrillation.  He has a history of hypertension and due to slight worsening of renal function he has had dose reduction of his chlorthalidone to 12.5 mg twice a week, losartan now down to 25 mg daily and he continues to take metoprolol tartrate on a as needed basis.  His most recent creatinine was slightly improved at 1.75 with an estimated GFR at 36 which places him in the stage III chronic kidney disease category.  He has had issues where he has noticed his heart rate dropping into the low 40s.  He has been on amiodarone 200 mg daily.  I am  recommending dose adjustment and he will initially reduce his amiodarone to 200 mg alternating with 100 mg every other day.  He has had issues with lower extremity edema on his reduced diuretic regimen.  With his renal insufficiency, rather than increase his diuretic I have recommended compression stockings at 20 to 30 mm.  He states his blood pressure at home typically runs in the 1 69-6 30 range systolically.  Blood pressure today was mildly increased.  He continues to be on levothyroxine for hypothyroidism, currently on 75 mcg.  He is on Uroxatrol but has not had any gout symptomatology.  He continues to be on low-dose statin therapy with rosuvastatin 5 mg.  Dr. Loney Loh checks his laboratory.  He continues to be on anticoagulation with Xarelto at the reduced dose of 50 mg due to his renal insufficiency.  I will see him in 6 months or sooner as needed for follow-up evaluation.   Time spent: 25 minute Troy Sine, MD, [facility]  08/22/2019 11:23 AM

## 2019-08-20 NOTE — Patient Instructions (Signed)
Medication Instructions:  CHANGE THE WAY YOU ARE TAKING YOUR AMIODARONE. TAKE 200MG  EVERY OTHER DAY (1 WHOLE PILL) THEN TAKE 100MG  ON THE OFF DAYS (1/2 PILL)  *If you need a refill on your cardiac medications before your next appointment, please call your pharmacy*   Follow-Up: At [facility], you and your health needs are our priority.  As part of our continuing mission to provide you with exceptional heart care, we have created designated Provider Care Teams.  These Care Teams include your primary Cardiologist (physician) and Advanced Practice Providers (APPs -  Physician Assistants and Nurse Practitioners) who all work together to provide you with the care you need, when you need it.  We recommend signing up for the patient portal called "MyChart".  Sign up information is provided on this After Visit Summary.  MyChart is used to connect with patients for Virtual Visits (Telemedicine).  Patients are able to view lab/test results, encounter notes, upcoming appointments, etc.  Non-urgent messages can be sent to your provider as well.   To learn more about what you can do with MyChart, go to [email or web address].    Your next appointment:   6 month(s)  The format for your next appointment:   In Person  Provider:   Shelva Majestic, MD

## 2019-08-20 NOTE — Telephone Encounter (Signed)
Patient's wife is requesting to come with patient to his appt today with Dr Claiborne Billings due to vision impairment.

## 2019-08-22 ENCOUNTER — Other Ambulatory Visit: Payer: Self-pay | Admitting: Cardiovascular Disease

## 2019-08-22 ENCOUNTER — Encounter: Payer: Self-pay | Admitting: Cardiovascular Disease

## 2019-08-24 ENCOUNTER — Encounter: Payer: Self-pay | Admitting: Cardiovascular Disease

## 2019-08-25 DIAGNOSIS — C44229 Squamous cell carcinoma of skin of left ear and external auricular canal: Secondary | ICD-10-CM | POA: Diagnosis not present

## 2019-09-01 ENCOUNTER — Ambulatory Visit: Payer: Medicare Other | Admitting: Medical

## 2019-09-03 DIAGNOSIS — D485 Neoplasm of uncertain behavior of skin: Secondary | ICD-10-CM | POA: Diagnosis not present

## 2019-09-03 DIAGNOSIS — D1801 Hemangioma of skin and subcutaneous tissue: Secondary | ICD-10-CM | POA: Diagnosis not present

## 2019-09-03 DIAGNOSIS — L218 Other seborrheic dermatitis: Secondary | ICD-10-CM | POA: Diagnosis not present

## 2019-09-10 DIAGNOSIS — I872 Venous insufficiency (chronic) (peripheral): Secondary | ICD-10-CM | POA: Diagnosis not present

## 2019-09-10 DIAGNOSIS — I739 Peripheral vascular disease, unspecified: Secondary | ICD-10-CM | POA: Diagnosis not present

## 2019-09-10 DIAGNOSIS — L602 Onychogryphosis: Secondary | ICD-10-CM | POA: Diagnosis not present

## 2019-10-06 DIAGNOSIS — E039 Hypothyroidism, unspecified: Secondary | ICD-10-CM | POA: Diagnosis not present

## 2019-10-06 DIAGNOSIS — E785 Hyperlipidemia, unspecified: Secondary | ICD-10-CM | POA: Diagnosis not present

## 2019-10-06 DIAGNOSIS — I10 Essential (primary) hypertension: Secondary | ICD-10-CM | POA: Diagnosis not present

## 2019-10-13 DIAGNOSIS — L570 Actinic keratosis: Secondary | ICD-10-CM | POA: Diagnosis not present

## 2019-10-13 DIAGNOSIS — E785 Hyperlipidemia, unspecified: Secondary | ICD-10-CM | POA: Diagnosis not present

## 2019-10-13 DIAGNOSIS — Z79899 Other long term (current) drug therapy: Secondary | ICD-10-CM | POA: Diagnosis not present

## 2019-10-13 DIAGNOSIS — I480 Paroxysmal atrial fibrillation: Secondary | ICD-10-CM | POA: Diagnosis not present

## 2019-10-13 DIAGNOSIS — Z79818 Long term (current) use of other agents affecting estrogen receptors and estrogen levels: Secondary | ICD-10-CM | POA: Diagnosis not present

## 2019-10-13 DIAGNOSIS — K5900 Constipation, unspecified: Secondary | ICD-10-CM | POA: Diagnosis not present

## 2019-10-13 DIAGNOSIS — J302 Other seasonal allergic rhinitis: Secondary | ICD-10-CM | POA: Diagnosis not present

## 2019-10-13 DIAGNOSIS — I10 Essential (primary) hypertension: Secondary | ICD-10-CM | POA: Diagnosis not present

## 2019-10-13 DIAGNOSIS — N1832 Chronic kidney disease, stage 3b: Secondary | ICD-10-CM | POA: Diagnosis not present

## 2019-10-13 DIAGNOSIS — N401 Enlarged prostate with lower urinary tract symptoms: Secondary | ICD-10-CM | POA: Diagnosis not present

## 2019-10-13 DIAGNOSIS — Z832 Family history of diseases of the blood and blood-forming organs and certain disorders involving the immune mechanism: Secondary | ICD-10-CM | POA: Diagnosis not present

## 2019-10-13 DIAGNOSIS — D649 Anemia, unspecified: Secondary | ICD-10-CM | POA: Diagnosis not present

## 2019-10-13 DIAGNOSIS — K219 Gastro-esophageal reflux disease without esophagitis: Secondary | ICD-10-CM | POA: Diagnosis not present

## 2019-10-13 DIAGNOSIS — Z0001 Encounter for general adult medical examination with abnormal findings: Secondary | ICD-10-CM | POA: Diagnosis not present

## 2019-10-13 DIAGNOSIS — E039 Hypothyroidism, unspecified: Secondary | ICD-10-CM | POA: Diagnosis not present

## 2019-10-15 ENCOUNTER — Other Ambulatory Visit: Payer: Self-pay | Admitting: Cardiovascular Disease

## 2019-10-16 ENCOUNTER — Other Ambulatory Visit: Payer: Self-pay | Admitting: Cardiovascular Disease

## 2019-10-26 ENCOUNTER — Ambulatory Visit (INDEPENDENT_AMBULATORY_CARE_PROVIDER_SITE_OTHER): Payer: Medicare Other | Admitting: Ophthalmology

## 2019-10-26 ENCOUNTER — Encounter (INDEPENDENT_AMBULATORY_CARE_PROVIDER_SITE_OTHER): Payer: Self-pay | Admitting: Ophthalmology

## 2019-10-26 ENCOUNTER — Other Ambulatory Visit: Payer: Self-pay

## 2019-10-26 DIAGNOSIS — H43811 Vitreous degeneration, right eye: Secondary | ICD-10-CM | POA: Insufficient documentation

## 2019-10-26 DIAGNOSIS — H1132 Conjunctival hemorrhage, left eye: Secondary | ICD-10-CM

## 2019-10-26 DIAGNOSIS — H353134 Nonexudative age-related macular degeneration, bilateral, advanced atrophic with subfoveal involvement: Secondary | ICD-10-CM | POA: Diagnosis not present

## 2019-10-26 DIAGNOSIS — H26492 Other secondary cataract, left eye: Secondary | ICD-10-CM | POA: Diagnosis not present

## 2019-10-26 HISTORY — DX: Vitreous degeneration, right eye: H43.811

## 2019-10-26 HISTORY — DX: Conjunctival hemorrhage, left eye: H11.32

## 2019-10-26 NOTE — Progress Notes (Signed)
10/26/2019     CHIEF COMPLAINT Patient presents for Retina Follow Up   HISTORY OF PRESENT ILLNESS: Tanner Ho is a 79 y.o. male who presents to the clinic today for:   HPI    Retina Follow Up    Patient presents with  Dry AMD.  In both eyes.  This started 1 year ago.  Severity is moderate.  Duration of 1 year.  Since onset it is stable.          Comments    1 Year AMD F/U OU  Pt sts floaters OU "may be a little worse." Pt denies any other noticeable changes to [place] OU. No ocular pain or flashes OU. Pt sts he uses Systane PRN OU.       Last edited by Rockie Neighbours, [place] on 10/26/2019  8:25 AM. (History)      Referring physician: [name], MD [place],  [place] [address]  HISTORICAL INFORMATION:   Selected notes from the MEDICAL RECORD NUMBER       CURRENT MEDICATIONS: Current Outpatient Medications (Ophthalmic Drugs)  Medication Sig  . Hypromellose 0.2 % SOLN Apply to eye.  Vladimir Faster Glycol-Propyl Glycol (SYSTANE) 0.4-0.3 % SOLN Apply 2 drops to eye daily as needed (for dry eeys).   No current facility-administered medications for this visit. (Ophthalmic Drugs)   Current Outpatient Medications (Other)  Medication Sig  . acetaminophen (TYLENOL 8 HOUR ARTHRITIS PAIN) 650 MG CR tablet Take 650 mg by mouth every 8 (eight) hours as needed for pain.  Marland Kitchen alfuzosin (UROXATRAL) 10 MG 24 hr tablet Take 10 mg by mouth daily.  Marland Kitchen amiodarone (PACERONE) 200 MG tablet Take 1 tablet (200 mg total) by mouth daily.  . chlorthalidone (HYGROTON) 25 MG tablet Take 0.5 tablets (12.5 mg total) by mouth 2 (two) times a week.  . Cholecalciferol (VITAMIN D-3 PO) Take 1 capsule by mouth daily.   . finasteride (PROSCAR) 5 MG tablet Take 5 mg by mouth daily.  . fish oil-omega-3 fatty acids 1000 MG capsule Take 1 g by mouth 2 (two) times daily.  . hydrocortisone 2.5 % cream APPLY TO AFFECTED AREA(S) TWICE DAILY  . Levothyroxine Sodium 75 MCG CAPS Take 75 mcg  by mouth daily.   Marland Kitchen linaclotide (LINZESS) 145 MCG CAPS capsule Linzess 145 mcg capsule  . loratadine (CLARITIN) 10 MG tablet Take 10 mg by mouth daily as needed for allergies.  Marland Kitchen losartan (COZAAR) 25 MG tablet Take 1 tablet (25 mg total) by mouth 2 (two) times daily. (Patient taking differently: Take 25 mg by mouth daily. )  . metoprolol tartrate (LOPRESSOR) 25 MG tablet 1/2 tablet in the am and pm  . Multiple Vitamins-Minerals (PRESERVISION AREDS) CAPS Take 1 capsule by mouth 2 (two) times daily. VITAMIN A FREE  . NON FORMULARY Take by mouth.  . NON FORMULARY Take by mouth.  . omega-3 acid ethyl esters (LOVAZA) 1 g capsule Take by mouth.  . rosuvastatin (CRESTOR) 5 MG tablet TAKE 1 TABLET BY MOUTH DAILY AT 6 PM.  . Vitamin D, Ergocalciferol, (DRISDOL) 1.25 MG (50000 UT) CAPS capsule Take by mouth.  Alveda Reasons 15 MG TABS tablet TAKE 1 TABLET (15 MG TOTAL) BY MOUTH DAILY WITH SUPPER.   No current facility-administered medications for this visit. (Other)      REVIEW OF SYSTEMS:    ALLERGIES Allergies  Allergen Reactions  . Ciprofloxacin Other (See Comments)    Cannot take due to currently taking Amiodarone  .  Hytrin [Terazosin] Other (See Comments)    Syncopal event     PAST MEDICAL HISTORY Past Medical History:  Diagnosis Date  . Adrenal mass ([place])   . Asthma   . BPH (benign prostatic hyperplasia)   . CKD (chronic kidney disease), stage III   . GERD (gastroesophageal reflux disease)   . H/O cardiovascular stress test 03/2012   EKG with 1-2 mm ST segment depression but normal perfusion images. Followup metastases test reduced exercise effort and functional status and determinate for myocardial dysfunction  . History of cardiac monitoring    a. 2015: cardiac monitor revealed episodes of PACs, bradycardia, with heart rates down to 48, 4 beat PSVT, 6 beat run of NSVT at a rate of 131. There was no recurrent afib.   Marland Kitchen Hyperlipidemia LDL goal < 100   . Hypertension   .  Hypothyroidism   . Macular degeneration   . MVP (mitral valve prolapse)    on cardiac cath - not mentioned on 2014 echo.  Marland Kitchen NSVT (nonsustained ventricular tachycardia) ([place])   . PAF (paroxysmal atrial fibrillation) ([place])    last episode 03/2012  . Pericarditis   . Premature atrial contractions   . Sinus bradycardia    Past Surgical History:  Procedure Laterality Date  . ABDOMINAL VASCULAR ULTRASOUND EVALUATION  12/20/2008   mild non-obstructive plaque, abdominal aorta, with mild calcification at the bifurcation-no obstruction, no evidence for aneurysm of the abdominal aorta  . CARDIAC CATHETERIZATION  01/04/1999   hyperdynamic LV function, probable angiographic mitral valve prolapse, systolic muscle bridging of the mid LAD without obstructive disease, systolic narrowing up to 96%  . Cardiopulmonary MET-test  07/20/2012   reduced exercise effort and functional status, peak max O2 comsumption was only 58% of predicted, cardiovascular status was interpreted as indeterminate for myocardial dysfunction due to the suboptimal peak cardiovascular stress load.    FAMILY HISTORY Family History  Problem Relation Age of Onset  . Stroke Mother   . Stroke Paternal Grandfather     SOCIAL HISTORY Social History   Tobacco Use  . Smoking status: Never Smoker  . Smokeless tobacco: Never Used  Substance Use Topics  . Alcohol use: No  . Drug use: No         OPHTHALMIC EXAM:  Base Eye Exam    Visual Acuity (ETDRS)      Right Left   Dist cc 20/400 20/200   Dist ph cc NI NI   Correction: Glasses       Tonometry (Tonopen, 8:30 AM)      Right Left   Pressure 12 13       Pupils      Pupils Dark Light Shape React APD   Right PERRL 4 3 Round Brisk None   Left PERRL 4 3 Round Brisk None       Visual Fields (Counting fingers)      Left Right    Full Full       Extraocular Movement      Right Left    Full Full       Neuro/Psych    Oriented x3: Yes   Mood/Affect: Normal        Dilation    Both eyes: 2.5% Phenylephrine @ 8:30 AM        Slit Lamp and Fundus Exam    Slit Lamp Exam      Right Left   Lids/Lashes Normal Normal   Conjunctiva/Sclera White and quiet Subconjunctival hemorrhage, old minor and  clearing   Cornea Clear Clear   Anterior Chamber Deep and quiet Deep and quiet   Iris Round and reactive Round and reactive   Lens Posterior chamber intraocular lens Posterior chamber intraocular lens   Anterior Vitreous Normal Normal       Fundus Exam      Right Left   Posterior Vitreous Posterior vitreous detachment Normal   Disc Normal Normal   C/D Ratio 0.0 0.0   Macula Advanced age related macular degeneration, Geographic atrophy 20-22 disc areas in size confluent Advanced age related macular degeneration, Geographic atrophy 20-22 disc areas in size confluent   Vessels Normal Normal   Periphery Normal Normal          IMAGING AND PROCEDURES  Imaging and Procedures for 10/26/19  OCT, Retina - OU - Both Eyes       Right Eye Quality was borderline. Progression has been stable. Findings include central retinal atrophy, outer retinal atrophy, inner retinal atrophy.   Left Eye Quality was borderline. Scan locations included subfoveal. Progression has been stable. Findings include central retinal atrophy, outer retinal atrophy, inner retinal atrophy.   Notes Bilateral diffuse geographic confluent RPE atrophy.  No signs of CNVM OU.                ASSESSMENT/PLAN:  No problem-specific Assessment & Plan notes found for this encounter.      ICD-10-CM   1. 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 Posterior vitreous detachment of right eye  H43.811   3. Left posterior capsular opacification  H26.492   4. Subconjunctival hemorrhage of left eye  H11.32     1.  Patient to notify of any sudden vision changes  2.  Patient reassured that  subconjunctival hemorrhage left eye is nonpathologic and should clear nicely  3.  Ophthalmic Meds Ordered this visit:  No orders of the defined types were placed in this encounter.      Return in about 1 year (around 10/25/2020) for DILATE OU, OCT.  There are no Patient Instructions on file for this visit.   Explained the diagnoses, plan, and follow up with the patient and they expressed understanding.  Patient expressed understanding of the importance of proper follow up care.   Clent Demark Masaki Rothbauer M.D. Diseases & Surgery of the Retina and Vitreous Retina & Diabetic [place] 10/26/19     Abbreviations: M myopia (nearsighted); A astigmatism; H hyperopia (farsighted); P presbyopia; Mrx spectacle prescription;  CTL contact lenses; OD right eye; OS left eye; OU both eyes  XT exotropia; ET esotropia; PEK punctate epithelial keratitis; PEE punctate epithelial erosions; DES dry eye syndrome; MGD meibomian gland dysfunction; ATs artificial tears; PFAT's preservative free artificial tears; [place] nuclear sclerotic cataract; PSC posterior subcapsular cataract; ERM epi-retinal membrane; PVD posterior vitreous detachment; RD retinal detachment; DM diabetes mellitus; DR diabetic retinopathy; NPDR non-proliferative diabetic retinopathy; PDR proliferative diabetic retinopathy; CSME clinically significant macular edema; DME diabetic macular edema; dbh dot blot hemorrhages; CWS cotton wool spot; POAG primary open angle glaucoma; C/D cup-to-disc ratio; HVF humphrey visual field; GVF goldmann visual field; OCT optical coherence tomography; IOP intraocular pressure; BRVO Branch retinal vein occlusion; CRVO central retinal vein occlusion; CRAO central retinal artery occlusion; BRAO branch retinal artery occlusion; RT retinal tear; SB scleral buckle; PPV pars plana vitrectomy; VH Vitreous hemorrhage; PRP panretinal laser photocoagulation; IVK intravitreal kenalog; VMT vitreomacular traction; MH Macular hole;  NVD  neovascularization of the disc; NVE neovascularization elsewhere; AREDS age related eye disease study; ARMD age related macular degeneration; POAG primary open angle glaucoma; EBMD epithelial/anterior basement membrane dystrophy; ACIOL anterior chamber intraocular lens; IOL intraocular lens; PCIOL posterior chamber intraocular lens; Phaco/IOL phacoemulsification with intraocular lens placement; [place] photorefractive keratectomy; LASIK laser assisted in situ keratomileusis; HTN hypertension; DM diabetes mellitus; COPD chronic obstructive pulmonary disease

## 2019-11-03 ENCOUNTER — Telehealth: Payer: Self-pay | Admitting: Cardiovascular Disease

## 2019-11-03 NOTE — Telephone Encounter (Signed)
Patient's wife, Remo Lipps, states she is requesting to update the patient's default pharmacy. She states the patient's preferred pharmacy is [facility], [place], [place] [address]. Please return call to confirm update.

## 2019-11-03 NOTE — Telephone Encounter (Signed)
Returned call-pharmacy updated

## 2019-11-25 ENCOUNTER — Other Ambulatory Visit: Payer: Self-pay | Admitting: Cardiovascular Disease

## 2020-01-11 DIAGNOSIS — N139 Obstructive and reflux uropathy, unspecified: Secondary | ICD-10-CM | POA: Diagnosis not present

## 2020-01-11 DIAGNOSIS — N401 Enlarged prostate with lower urinary tract symptoms: Secondary | ICD-10-CM | POA: Diagnosis not present

## 2020-01-11 DIAGNOSIS — R3914 Feeling of incomplete bladder emptying: Secondary | ICD-10-CM | POA: Diagnosis not present

## 2020-01-13 DIAGNOSIS — L821 Other seborrheic keratosis: Secondary | ICD-10-CM | POA: Diagnosis not present

## 2020-01-13 DIAGNOSIS — Z85828 Personal history of other malignant neoplasm of skin: Secondary | ICD-10-CM | POA: Diagnosis not present

## 2020-01-13 DIAGNOSIS — L820 Inflamed seborrheic keratosis: Secondary | ICD-10-CM | POA: Diagnosis not present

## 2020-01-13 DIAGNOSIS — D229 Melanocytic nevi, unspecified: Secondary | ICD-10-CM | POA: Diagnosis not present

## 2020-01-13 DIAGNOSIS — L570 Actinic keratosis: Secondary | ICD-10-CM | POA: Diagnosis not present

## 2020-01-13 DIAGNOSIS — L905 Scar conditions and fibrosis of skin: Secondary | ICD-10-CM | POA: Diagnosis not present

## 2020-02-16 DIAGNOSIS — I739 Peripheral vascular disease, unspecified: Secondary | ICD-10-CM | POA: Diagnosis not present

## 2020-02-16 DIAGNOSIS — L84 Corns and callosities: Secondary | ICD-10-CM | POA: Diagnosis not present

## 2020-02-16 DIAGNOSIS — L602 Onychogryphosis: Secondary | ICD-10-CM | POA: Diagnosis not present

## 2020-02-23 ENCOUNTER — Other Ambulatory Visit: Payer: Self-pay

## 2020-02-23 ENCOUNTER — Other Ambulatory Visit: Payer: Self-pay | Admitting: *Deleted

## 2020-02-23 MED ORDER — RIVAROXABAN 15 MG PO TABS: 15.0000 mg | ORAL_TABLET | Freq: Every day | ORAL | 1 refills | Status: DC

## 2020-03-02 ENCOUNTER — Telehealth (INDEPENDENT_AMBULATORY_CARE_PROVIDER_SITE_OTHER): Payer: Medicare Other | Admitting: Physician Assistant

## 2020-03-02 ENCOUNTER — Telehealth: Payer: Self-pay

## 2020-03-02 ENCOUNTER — Encounter: Payer: Self-pay | Admitting: Physician Assistant

## 2020-03-02 VITALS — BP 121/43 | HR 60 | Ht 68.0 in | Wt 163.0 lb

## 2020-03-02 DIAGNOSIS — E039 Hypothyroidism, unspecified: Secondary | ICD-10-CM

## 2020-03-02 DIAGNOSIS — I341 Nonrheumatic mitral (valve) prolapse: Secondary | ICD-10-CM | POA: Diagnosis not present

## 2020-03-02 DIAGNOSIS — I10 Essential (primary) hypertension: Secondary | ICD-10-CM | POA: Diagnosis not present

## 2020-03-02 DIAGNOSIS — N1832 Chronic kidney disease, stage 3b: Secondary | ICD-10-CM

## 2020-03-02 DIAGNOSIS — E785 Hyperlipidemia, unspecified: Secondary | ICD-10-CM | POA: Diagnosis not present

## 2020-03-02 DIAGNOSIS — I480 Paroxysmal atrial fibrillation: Secondary | ICD-10-CM | POA: Diagnosis not present

## 2020-03-02 NOTE — Progress Notes (Signed)
Virtual Visit via Telephone Note   This visit type was conducted due to national recommendations for restrictions regarding the COVID-19 Pandemic (e.g. social distancing) in an effort to limit this patient's exposure and mitigate transmission in our community.  Due to his co-morbid illnesses, this patient is at least at moderate risk for complications without adequate follow up.  This format is felt to be most appropriate for this patient at this time.  The patient did not have access to video technology/had technical difficulties with video requiring transitioning to audio format only (telephone).  All issues noted in this document were discussed and addressed.  No physical exam could be performed with this format.  Please refer to the patient's chart for his  consent to telehealth for [name].    Date:  03/04/2020   ID:  Tanner Ho, DOB 1941/03/25, MRN [id number] The patient was identified using 2 identifiers.  Patient Location: Home Provider Location: Office/Clinic  PCP:  Tanner Pretty, MD  Cardiologist:  Tanner Majestic, MD  Electrophysiologist:  None   Evaluation Performed:  Follow-Up Visit  Chief Complaint:  Follow up  History of Present Illness:    Tanner Ho is a 79 y.o. male with past medical history of mitral valve prolapse, hypertension, hyperlipidemia, CKD stage III, hypothyroidism and PAF on Xarelto.  He was diagnosed with atrial fibrillation in October 2013.  Myoview obtained during the same month showed normal perfusion however EKG portion did have 1 to 2 mm ST depression.  A cardiopulmonary metabolic test obtained in April 2014 demonstrated reduced peak maximal O2 consumption at 58%, suboptimal peak cardiovascular stress load making cardiovascular status interpretation indeterminate, mild ventilation/perfusion mismatch suggested impaired circulation and increased dead space, blunted chronotropic response to exercise.  Cardizem was subsequently reduced to 180 mg  daily.  Myoview obtained in November 2014 was normal with mild diaphragmatic attenuation however no scar or ischemia.  Heart monitor obtained in 2015 revealed episodes of PAC, bradycardia, heart rate down to 48 bpm, 6 beats wide complex tachycardia, however no recurrent PAF.  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  He was last seen by Dr. Claiborne Ho in March 2021, amiodarone was reduced to alternating 200 and 100 mg every other day.  Patient presents today for virtual visit via telephone.  He denies any recent exertional chest pain or shortness of breath.  He is quite concerned about his kidney function, we will repeat a basic metabolic panel.  Otherwise he has no lower extremity edema, orthopnea or PND.  He has not had any recent palpitation.  He can follow-up with Dr. Claiborne Ho in 6 months.  The patient does not have symptoms concerning for COVID-19 infection (fever, chills, cough, or new shortness of breath).    Past Medical History:  Diagnosis Date  . Adrenal mass ([place])   . Asthma   . BPH (benign prostatic hyperplasia)   . CKD (chronic kidney disease), stage III   . GERD (gastroesophageal reflux disease)   . H/O cardiovascular stress test 03/2012   EKG with 1-2 mm ST segment depression but normal perfusion images. Followup metastases test reduced exercise effort and functional status and determinate for myocardial dysfunction  . History of cardiac monitoring    a. 2015: cardiac monitor revealed episodes of PACs, bradycardia, with heart rates down to 48, 4 beat PSVT, 6 beat run of NSVT at a rate of 131.  There was no recurrent afib.   Marland Kitchen Hyperlipidemia LDL goal < 100   . Hypertension   . Hypothyroidism   . Macular degeneration   . MVP (mitral valve prolapse)    on cardiac cath - not mentioned on 2014 echo.  Marland Kitchen NSVT (nonsustained ventricular tachycardia) ([place])   . PAF (paroxysmal atrial  fibrillation) ([place])    last episode 03/2012  . Pericarditis   . Premature atrial contractions   . Sinus bradycardia    Past Surgical History:  Procedure Laterality Date  . ABDOMINAL VASCULAR ULTRASOUND EVALUATION  12/20/2008   mild non-obstructive plaque, abdominal aorta, with mild calcification at the bifurcation-no obstruction, no evidence for aneurysm of the abdominal aorta  . CARDIAC CATHETERIZATION  01/04/1999   hyperdynamic LV function, probable angiographic mitral valve prolapse, systolic muscle bridging of the mid LAD without obstructive disease, systolic narrowing up to 35%  . Cardiopulmonary MET-test  07/20/2012   reduced exercise effort and functional status, peak max O2 comsumption was only 58% of predicted, cardiovascular status was interpreted as indeterminate for myocardial dysfunction due to the suboptimal peak cardiovascular stress load.     Current Meds  Medication Sig  . acetaminophen (TYLENOL 8 HOUR ARTHRITIS PAIN) 650 MG CR tablet Take 650 mg by mouth every 8 (eight) hours as needed for pain.  Marland Kitchen alfuzosin (UROXATRAL) 10 MG 24 hr tablet Take 10 mg by mouth daily.  Marland Kitchen amiodarone (PACERONE) 200 MG tablet Take 1 tablet (200 mg total) by mouth daily. (Patient taking differently: Take 200 mg by mouth daily. Take a whole tablet every other day and half tablet the other days)  . chlorthalidone (HYGROTON) 25 MG tablet Take 0.5 tablets (12.5 mg total) by mouth 2 (two) times a week.  . Cholecalciferol (VITAMIN D-3 PO) Take 1 capsule by mouth daily.   . finasteride (PROSCAR) 5 MG tablet Take 5 mg by mouth daily.  . fish oil-omega-3 fatty acids 1000 MG capsule Take 1 g by mouth 2 (two) times daily.  . hydrocortisone 2.5 % cream Apply 1 application topically as needed.   . Hypromellose 0.2 % SOLN Apply to eye.  . Levothyroxine Sodium 75 MCG CAPS Take 75 mcg by mouth daily.   Marland Kitchen loratadine (CLARITIN) 10 MG tablet Take 10 mg by mouth daily.   Marland Kitchen losartan (COZAAR) 25 MG tablet Take 1 tablet  (25 mg total) by mouth daily.  . metoprolol tartrate (LOPRESSOR) 25 MG tablet as needed.   . Multiple Vitamins-Minerals (PRESERVISION AREDS) CAPS Take 1 capsule by mouth 2 (two) times daily. VITAMIN A FREE  . Polyethyl Glycol-Propyl Glycol (SYSTANE) 0.4-0.3 % SOLN Apply 2 drops to eye daily as needed (for dry eeys).  . Rivaroxaban (XARELTO) 15 MG TABS tablet Take 1 tablet (15 mg total) by mouth daily with supper.  . rosuvastatin (CRESTOR) 5 MG tablet TAKE 1 TABLET BY MOUTH DAILY AT 6 PM.  . vitamin B-12 (CYANOCOBALAMIN) 1000 MCG tablet Take 1,000 mcg by mouth daily.     Allergies:   Ciprofloxacin and Hytrin [terazosin]   Social History   Tobacco Use  . Smoking status: Former Research scientist (life sciences)  . Smokeless tobacco: Never Used  Vaping Use  . Vaping Use: Never used  Substance Use Topics  . Alcohol use: No  . Drug use: No     Family Hx: The patient's family history includes Stroke in his mother and paternal grandfather.  ROS:   Please see the history of present illness.     All other systems reviewed  and are negative.   Prior CV studies:   The following studies were reviewed today:  Echo 09/04/2016 LV EF: 55% -  60%   -------------------------------------------------------------------  Indications:   Murmur (R01.1).   -------------------------------------------------------------------  History:  PMH: CKD, Hyperlipidemia. Atrial fibrillation. Mitral  valve prolapse. Risk factors: Hypertension. Dyslipidemia.   -------------------------------------------------------------------  Study Conclusions   - Left ventricle: The cavity size was normal. Wall thickness was  increased in a pattern of moderate LVH. Systolic function was  normal. The estimated ejection fraction was in the range of 55%  to 60%. Wall motion was normal; there were no regional wall  motion abnormalities. The study is not technically sufficient to  allow evaluation of LV diastolic function.  -  Aortic valve: Trileaflet; mildly thickened leaflets. There was  mild regurgitation.  - Aorta: Aortic root dimension: 39 mm (ED).  - Aortic root: The aortic root is mildly dilated.  - Mitral valve: Mild late systolic bileaflet prolapse. There is  mild regurgitation. Mildly thickened leaflets .  - Left atrium: The atrium was normal in size.  - Atrial septum: No defect or patent foramen ovale was identified.  - Tricuspid valve: There was trivial regurgitation.  - Pulmonary arteries: PA peak pressure: 39 mm Hg (S).  - Inferior vena cava: The vessel was normal in size. The  respirophasic diameter changes were in the normal range (= 50%),  consistent with normal central venous pressure.   Impressions:   - Compared to a prior study in 2014, the LVEF is unchanged. The  aortic root is mildly dilated at 3.9 cm. 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   Labs/Other Tests and Data Reviewed:    EKG:  An ECG dated 08/20/2019 was personally reviewed today and demonstrated:  Sinus bradycardia, no significant ST-T wave changes.  Recent Labs: 04/22/2019: ALT 14 08/19/2019: BUN 30; Creatinine, Ser 1.75; Potassium 4.5; Sodium 137   Recent Lipid Panel Lab Results  Component Value Date/Time   CHOL 120 (L) 12/28/2014 09:24 AM   TRIG 85 12/28/2014 09:24 AM   HDL 49 12/28/2014 09:24 AM   CHOLHDL 2.4 12/28/2014 09:24 AM   LDLCALC 54 12/28/2014 09:24 AM    Wt Readings from Last 3 Encounters:  03/02/20 163 lb (73.9 kg)  08/20/19 162 lb 3.2 oz (73.6 kg)  04/27/19 162 lb (73.5 kg)     Objective:    Vital Signs:  BP (!) 121/43   Pulse 60   Ht '5\' 8"'  (1.727 m)   Wt 163 lb (73.9 kg)   SpO2 97%   BMI 24.78 kg/m    VITAL SIGNS:  reviewed  ASSESSMENT & PLAN:    1. PAF: On Xarelto and metoprolol. No recent palpitation.  2. Mitral valve prolapse: Stable on last echocardiogram.  3. Hypertension: Blood pressure well controlled  4. Hyperlipidemia: On  Crestor.  5. Hypothyroidism: Managed by primary care provider  6. CKD stage III: Obtain basic metabolic panel.  COVID-19 Education: The signs and symptoms of COVID-19 were discussed with the patient and how to seek care for testing (follow up with PCP or arrange E-visit).  The importance of social distancing was discussed today.  Time:   Today, I have spent 10 minutes with the patient with telehealth technology discussing the above problems.     Medication Adjustments/Labs and Tests Ordered: Current medicines are reviewed at length with the patient today.  Concerns regarding medicines are outlined above.   Tests Ordered: Orders Placed This  Encounter  Procedures  . Basic metabolic panel    Medication Changes: No orders of the defined types were placed in this encounter.   Follow Up:  In Person in 6 month(s)  Signed, Almyra Deforest, [place]  03/04/2020 10:04 PM    [place]

## 2020-03-02 NOTE — Patient Instructions (Signed)
Medication Instructions:  No changes *If you need a refill on your cardiac medications before your next appointment, please call your pharmacy*   Lab Work: BMET in 1-2 weeks If you have labs (blood work) drawn today and your tests are completely normal, you will receive your results only by: Marland Kitchen MyChart Message (if you have MyChart) OR . A paper copy in the mail If you have any lab test that is abnormal or we need to change your treatment, we will call you to review the results.   Testing/Procedures: None    Follow-Up: At [facility], you and your health needs are our priority.  As part of our continuing mission to provide you with exceptional heart care, we have created designated Provider Care Teams.  These Care Teams include your primary Cardiologist (physician) and Advanced Practice Providers (APPs -  Physician Assistants and Nurse Practitioners) who all work together to provide you with the care you need, when you need it.  We recommend signing up for the patient portal called "MyChart".  Sign up information is provided on this After Visit Summary.  MyChart is used to connect with patients for Virtual Visits (Telemedicine).  Patients are able to view lab/test results, encounter notes, upcoming appointments, etc.  Non-urgent messages can be sent to your provider as well.   To learn more about what you can do with MyChart, go to [email or web address].    Your next appointment:   6 month(s)  The format for your next appointment:   In Person  Provider:   Shelva Majestic, MD   Other Instructions None

## 2020-03-02 NOTE — Telephone Encounter (Signed)
°  Patient Consent for Virtual Visit         Tanner Ho has provided verbal consent on 03/02/2020 for a virtual visit (video or telephone).   CONSENT FOR VIRTUAL VISIT FOR:  Tanner Ho  By participating in this virtual visit I agree to the following:  I hereby voluntarily request, consent and authorize [facility] and its employed or contracted physicians, physician assistants, nurse practitioners or other licensed health care professionals (the Practitioner), to provide me with telemedicine health care services (the Services") as deemed necessary by the treating Practitioner. I acknowledge and consent to receive the Services by the Practitioner via telemedicine. I understand that the telemedicine visit will involve communicating with the Practitioner through live audiovisual communication technology and the disclosure of certain medical information by electronic transmission. I acknowledge that I have been given the opportunity to request an in-person assessment or other available alternative prior to the telemedicine visit and am voluntarily participating in the telemedicine visit.  I understand that I have the right to withhold or withdraw my consent to the use of telemedicine in the course of my care at any time, without affecting my right to future care or treatment, and that the Practitioner or I may terminate the telemedicine visit at any time. I understand that I have the right to inspect all information obtained and/or recorded in the course of the telemedicine visit and may receive copies of available information for a reasonable fee.  I understand that some of the potential risks of receiving the Services via telemedicine include:   Delay or interruption in medical evaluation due to technological equipment failure or disruption;  Information transmitted may not be sufficient (e.g. poor resolution of images) to allow for appropriate medical decision making by the Practitioner;  and/or   In rare instances, security protocols could fail, causing a breach of personal health information.  Furthermore, I acknowledge that it is my responsibility to provide information about my medical history, conditions and care that is complete and accurate to the best of my ability. I acknowledge that Practitioner's advice, recommendations, and/or decision may be based on factors not within their control, such as incomplete or inaccurate data provided by me or distortions of diagnostic images or specimens that may result from electronic transmissions. I understand that the practice of medicine is not an exact science and that Practitioner makes no warranties or guarantees regarding treatment outcomes. I acknowledge that a copy of this consent can be made available to me via my patient portal ([facility]), or I can request a printed copy by calling the office of [facility].    I understand that my insurance will be billed for this visit.   I have read or had this consent read to me.  I understand the contents of this consent, which adequately explains the benefits and risks of the Services being provided via telemedicine.   I have been provided ample opportunity to ask questions regarding this consent and the Services and have had my questions answered to my satisfaction.  I give my informed consent for the services to be provided through the use of telemedicine in my medical care

## 2020-03-08 DIAGNOSIS — Z20822 Contact with and (suspected) exposure to covid-19: Secondary | ICD-10-CM | POA: Diagnosis not present

## 2020-03-13 DIAGNOSIS — N1832 Chronic kidney disease, stage 3b: Secondary | ICD-10-CM | POA: Diagnosis not present

## 2020-03-13 LAB — BASIC METABOLIC PANEL
BUN/Creatinine Ratio: 21 (ref 10–24)
BUN: 35 mg/dL — ABNORMAL HIGH (ref 8–27)
CO2: 25 mmol/L (ref 20–29)
Calcium: 9.5 mg/dL (ref 8.6–10.2)
Chloride: 101 mmol/L (ref 96–106)
Creatinine, Ser: 1.63 mg/dL — ABNORMAL HIGH (ref 0.76–1.27)
GFR calc Af Amer: 46 mL/min/{1.73_m2} — ABNORMAL LOW (ref >59)
GFR calc non Af Amer: 39 mL/min/{1.73_m2} — ABNORMAL LOW (ref >59)
Glucose: 93 mg/dL (ref 65–99)
Potassium: 4.4 mmol/L (ref 3.5–5.2)
Sodium: 141 mmol/L (ref 134–144)

## 2020-03-14 ENCOUNTER — Telehealth: Payer: Self-pay | Admitting: Cardiovascular Disease

## 2020-03-14 NOTE — Telephone Encounter (Signed)
Patient calling the office for samples of medication:   1.  What medication and dosage are you requesting samples for?  Rivaroxaban (XARELTO) 15 MG TABS tablet  2.  Are you currently out of this medication? No  Patient is in the donut hole. He will run out of medication at the beginning of December and would like to get samples to last him until after January 1

## 2020-03-14 NOTE — Telephone Encounter (Signed)
Spoke to patient, aware PA to review results and will call with recommendations.   Aware prelim is stable.

## 2020-03-14 NOTE — Telephone Encounter (Signed)
Patient's wife called for lab results.

## 2020-03-14 NOTE — Telephone Encounter (Signed)
Called and informed to call in December when he has at least 1 week left. Verbalized understanding

## 2020-03-17 DIAGNOSIS — Z23 Encounter for immunization: Secondary | ICD-10-CM | POA: Diagnosis not present

## 2020-03-29 ENCOUNTER — Encounter: Payer: Self-pay | Admitting: *Deleted

## 2020-04-19 ENCOUNTER — Telehealth: Payer: Self-pay

## 2020-04-19 NOTE — Telephone Encounter (Signed)
Cologuard recall letter has been mailed to the patient at his home address on file

## 2020-05-08 DIAGNOSIS — I872 Venous insufficiency (chronic) (peripheral): Secondary | ICD-10-CM | POA: Diagnosis not present

## 2020-05-08 DIAGNOSIS — L602 Onychogryphosis: Secondary | ICD-10-CM | POA: Diagnosis not present

## 2020-05-08 DIAGNOSIS — I739 Peripheral vascular disease, unspecified: Secondary | ICD-10-CM | POA: Diagnosis not present

## 2020-06-23 ENCOUNTER — Telehealth: Payer: Self-pay | Admitting: Gastroenterology

## 2020-06-23 ENCOUNTER — Other Ambulatory Visit: Payer: Self-pay

## 2020-06-23 MED ORDER — LINACLOTIDE 145 MCG PO CAPS: 145.0000 ug | ORAL_CAPSULE | ORAL | 3 refills | Status: AC | PRN

## 2020-06-23 NOTE — Telephone Encounter (Signed)
Inbound call from patient's wife requesting refills for Linzess 145 mcg be sent to [facility] on [address] in [place] please.

## 2020-07-13 ENCOUNTER — Other Ambulatory Visit: Payer: Self-pay | Admitting: Cardiovascular Disease

## 2020-07-20 DIAGNOSIS — Z85828 Personal history of other malignant neoplasm of skin: Secondary | ICD-10-CM | POA: Diagnosis not present

## 2020-07-20 DIAGNOSIS — L821 Other seborrheic keratosis: Secondary | ICD-10-CM | POA: Diagnosis not present

## 2020-07-20 DIAGNOSIS — L905 Scar conditions and fibrosis of skin: Secondary | ICD-10-CM | POA: Diagnosis not present

## 2020-07-20 DIAGNOSIS — L570 Actinic keratosis: Secondary | ICD-10-CM | POA: Diagnosis not present

## 2020-07-21 DIAGNOSIS — N312 Flaccid neuropathic bladder, not elsewhere classified: Secondary | ICD-10-CM | POA: Diagnosis not present

## 2020-07-21 DIAGNOSIS — N401 Enlarged prostate with lower urinary tract symptoms: Secondary | ICD-10-CM | POA: Diagnosis not present

## 2020-08-02 DIAGNOSIS — L602 Onychogryphosis: Secondary | ICD-10-CM | POA: Diagnosis not present

## 2020-08-02 DIAGNOSIS — I739 Peripheral vascular disease, unspecified: Secondary | ICD-10-CM | POA: Diagnosis not present

## 2020-08-11 ENCOUNTER — Other Ambulatory Visit: Payer: Self-pay | Admitting: Cardiovascular Disease

## 2020-08-14 ENCOUNTER — Other Ambulatory Visit: Payer: Self-pay

## 2020-08-14 ENCOUNTER — Ambulatory Visit (INDEPENDENT_AMBULATORY_CARE_PROVIDER_SITE_OTHER): Payer: Medicare Other | Admitting: Ophthalmology

## 2020-08-14 ENCOUNTER — Encounter (INDEPENDENT_AMBULATORY_CARE_PROVIDER_SITE_OTHER): Payer: Self-pay

## 2020-08-14 ENCOUNTER — Encounter (INDEPENDENT_AMBULATORY_CARE_PROVIDER_SITE_OTHER): Payer: Self-pay | Admitting: Ophthalmology

## 2020-08-14 DIAGNOSIS — H5340 Unspecified visual field defects: Secondary | ICD-10-CM | POA: Diagnosis not present

## 2020-08-14 DIAGNOSIS — H353134 Nonexudative age-related macular degeneration, bilateral, advanced atrophic with subfoveal involvement: Secondary | ICD-10-CM

## 2020-08-14 DIAGNOSIS — H33001 Unspecified retinal detachment with retinal break, right eye: Secondary | ICD-10-CM | POA: Diagnosis not present

## 2020-08-14 DIAGNOSIS — H5213 Myopia, bilateral: Secondary | ICD-10-CM | POA: Diagnosis not present

## 2020-08-14 MED ORDER — TOBRAMYCIN-DEXAMETHASONE 0.3-0.1 % OP SUSP: 1.0000 [drp] | Freq: Four times a day (QID) | OPHTHALMIC | 0 refills | Status: DC

## 2020-08-14 NOTE — Progress Notes (Signed)
08/14/2020     CHIEF COMPLAINT Patient presents for Retina Evaluation (WIP - RD OD//Pt sts he had a fall a couple weeks ago and sts, "I hit my head a little bit I think." Pt denies any flashes, floaters, veil, or curtain in [place]. Pt denies any new symptoms OU.)   HISTORY OF PRESENT ILLNESS: Tanner Ho is a 80 y.o. male who presents to the clinic today for:   HPI    Retina Evaluation    In right eye.  Treatments tried include no treatments. Additional comments: WIP - RD OD  Pt sts he had a fall a couple weeks ago and sts, "I hit my head a little bit I think." Pt denies any flashes, floaters, veil, or curtain in [place]. Pt denies any new symptoms OU.       Last edited by Rockie Neighbours, [place] on 08/14/2020 11:59 AM. (History)      Referring physician: [name], MD [place],  [place] [address]  HISTORICAL INFORMATION:   Selected notes from the MEDICAL RECORD NUMBER       CURRENT MEDICATIONS: Current Outpatient Medications (Ophthalmic Drugs)  Medication Sig  . Hypromellose 0.2 % SOLN Apply to eye.  Vladimir Faster Glycol-Propyl Glycol (SYSTANE) 0.4-0.3 % SOLN Apply 2 drops to eye daily as needed (for dry eeys).   No current facility-administered medications for this visit. (Ophthalmic Drugs)   Current Outpatient Medications (Other)  Medication Sig  . acetaminophen (TYLENOL 8 HOUR ARTHRITIS PAIN) 650 MG CR tablet Take 650 mg by mouth every 8 (eight) hours as needed for pain.  Marland Kitchen alfuzosin (UROXATRAL) 10 MG 24 hr tablet Take 10 mg by mouth daily.  Marland Kitchen amiodarone (PACERONE) 200 MG tablet Take 1 tablet (200 mg total) by mouth daily. (Patient taking differently: Take 200 mg by mouth daily. Take a whole tablet every other day and half tablet the other days)  . chlorthalidone (HYGROTON) 25 MG tablet TAKE 1/2 TABLET (12.5 MG TOTAL) BY MOUTH 2 (TWO) TIMES A WEEK.  . Cholecalciferol (VITAMIN D-3 PO) Take 1 capsule by mouth daily.   . finasteride (PROSCAR) 5 MG  tablet Take 5 mg by mouth daily.  . fish oil-omega-3 fatty acids 1000 MG capsule Take 1 g by mouth 2 (two) times daily.  . hydrocortisone 2.5 % cream Apply 1 application topically as needed.   . Levothyroxine Sodium 75 MCG CAPS Take 75 mcg by mouth daily.   Marland Kitchen linaclotide (LINZESS) 145 MCG CAPS capsule Take 1 capsule (145 mcg total) by mouth as needed.  . loratadine (CLARITIN) 10 MG tablet Take 10 mg by mouth daily.   Marland Kitchen losartan (COZAAR) 25 MG tablet Take 1 tablet (25 mg total) by mouth daily.  . metoprolol tartrate (LOPRESSOR) 25 MG tablet as needed.   . Multiple Vitamins-Minerals (PRESERVISION AREDS) CAPS Take 1 capsule by mouth 2 (two) times daily. VITAMIN A FREE  . NON FORMULARY Take by mouth.  . NON FORMULARY Take by mouth.  . Rivaroxaban (XARELTO) 15 MG TABS tablet Take 1 tablet (15 mg total) by mouth daily with supper.  . rosuvastatin (CRESTOR) 5 MG tablet TAKE 1 TABLET BY MOUTH DAILY AT 6 PM.  . vitamin B-12 (CYANOCOBALAMIN) 1000 MCG tablet Take 1,000 mcg by mouth daily.   No current facility-administered medications for this visit. (Other)      REVIEW OF SYSTEMS:    ALLERGIES Allergies  Allergen Reactions  . Ciprofloxacin Other (See Comments)    Cannot take  due to currently taking Amiodarone  . Hytrin [Terazosin] Other (See Comments)    Syncopal event     PAST MEDICAL HISTORY Past Medical History:  Diagnosis Date  . Adrenal mass ([place])   . Asthma   . BPH (benign prostatic hyperplasia)   . CKD (chronic kidney disease), stage III ([place])   . GERD (gastroesophageal reflux disease)   . H/O cardiovascular stress test 03/2012   EKG with 1-2 mm ST segment depression but normal perfusion images. Followup metastases test reduced exercise effort and functional status and determinate for myocardial dysfunction  . History of cardiac monitoring    a. 2015: cardiac monitor revealed episodes of PACs, bradycardia, with heart rates down to 48, 4 beat PSVT, 6 beat run of NSVT at a rate  of 131. There was no recurrent afib.   Marland Kitchen Hyperlipidemia LDL goal < 100   . Hypertension   . Hypothyroidism   . Macular degeneration   . MVP (mitral valve prolapse)    on cardiac cath - not mentioned on 2014 echo.  Marland Kitchen NSVT (nonsustained ventricular tachycardia) ([place])   . PAF (paroxysmal atrial fibrillation) ([place])    last episode 03/2012  . Pericarditis   . Premature atrial contractions   . Sinus bradycardia    Past Surgical History:  Procedure Laterality Date  . ABDOMINAL VASCULAR ULTRASOUND EVALUATION  12/20/2008   mild non-obstructive plaque, abdominal aorta, with mild calcification at the bifurcation-no obstruction, no evidence for aneurysm of the abdominal aorta  . CARDIAC CATHETERIZATION  01/04/1999   hyperdynamic LV function, probable angiographic mitral valve prolapse, systolic muscle bridging of the mid LAD without obstructive disease, systolic narrowing up to 00%  . Cardiopulmonary MET-test  07/20/2012   reduced exercise effort and functional status, peak max O2 comsumption was only 58% of predicted, cardiovascular status was interpreted as indeterminate for myocardial dysfunction due to the suboptimal peak cardiovascular stress load.    FAMILY HISTORY Family History  Problem Relation Age of Onset  . Stroke Mother   . Stroke Paternal Grandfather     SOCIAL HISTORY Social History   Tobacco Use  . Smoking status: Former Research scientist (life sciences)  . Smokeless tobacco: Never Used  Vaping Use  . Vaping Use: Never used  Substance Use Topics  . Alcohol use: No  . Drug use: No         OPHTHALMIC EXAM: Base Eye Exam    Visual Acuity (ETDRS)      Right Left   Dist cc 20/400 20/200   Dist ph cc NI NI   Correction: Glasses       Tonometry (Tonopen, 12:00 PM)      Right Left   Pressure 16 15       Pupils      Pupils Dark Light Shape React APD   Right PERRL 8.5 8.5 Round Dilated None   Left PERRL 8.5 8.5 Round Dilated None       Visual Fields (Counting fingers)      Left Right     Full    Restrictions  Total superior temporal, superior nasal deficiencies       Extraocular Movement      Right Left    Full Full       Neuro/Psych    Oriented x3: Yes   Mood/Affect: Normal       Dilation    Right eye: 1.0% Mydriacyl, 2.5% Phenylephrine @ 12:03 PM        Slit Lamp and Fundus Exam  Slit Lamp Exam      Right Left   Lids/Lashes Normal Normal   Conjunctiva/Sclera White and quiet Subconjunctival hemorrhage, old minor and clearing   Cornea Clear Clear   Anterior Chamber Deep and quiet Deep and quiet   Iris Round and reactive Round and reactive   Lens Posterior chamber intraocular lens Posterior chamber intraocular lens   Anterior Vitreous Normal Normal       Fundus Exam      Right Left   Posterior Vitreous Posterior vitreous detachment Normal   Disc Normal Normal   C/D Ratio 0.0 0.0   Macula Advanced age related macular degeneration, Geographic atrophy 4 disc areas in size confluent, no hemorrhage, no macular thickening Advanced age related macular degeneration, Geographic atrophy 20-22 disc areas in size confluent   Vessels Normal Normal   Periphery Inferior Retinal detachment 3-6-9, Macula on,  Normal          IMAGING AND PROCEDURES  Imaging and Procedures for 08/14/20  Color Fundus Photography Optos - OU - Both Eyes       Right Eye Progression has no prior data. Disc findings include normal observations. Macula : geographic atrophy. Periphery : detachment.   Left Eye Progression has no prior data. Disc findings include normal observations. Macula : geographic atrophy. Vessels : normal observations. Periphery : normal observations.   Notes Inferior retinal detachment, macula on due to large geographic macular atrophy.  Right eye  OS geographic macular atrophy, incidental posterior vitreous detachment       B-Scan Ultrasound - OD - Right Eye       Quality was good. Findings included extensive retinal detachment.    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 The results of the AREDS 2 study was discussed with the patient. A diet rich in dark leafy green vegetables was advised and specific recommendations were made regarding supplements with AREDS 2 formulation . Control of hypertension and serum cholesterol may slow the disease. Smoking cessation is mandatory to slow the disease and diminish the risk of progressing to wet age related macular degeneration. The patient was instructed in the use of an [place] and was told to return immediately for any changes in the Grid. 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  Possible side effects of the various surgical procedures were discussed with the patient.  Possible complications of surgery were discussed with the patient.  Explained the following success rates are involved with retinal detachment surgeries: 90% rate of reattachment success with primary operation, 85% chance of 2nd operation needed (if Pneumatic isn't done first), 70-75% chance of 3rd operation, & 50% chance of 4th & subsequent surgeries.   Patient understands that ongoing scarring of retina may lead to reoccurrence of retinal tears or detachments.  The patient's questions were answered. An informational brochure was given to the patient.  All the patient's questions were answered.  OD we will schedule repair via vitrectomy in the pseudophakic eye with drainage of subretinal fluid, laser  retinopexy and injection intravitreal gas patient is able to lie on the right side down predominantly but is able to avoid sleeping on back      ICD-10-CM   1. 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 Nonexudative age-related macular degeneration, bilateral, advanced atrophic with subfoveal involvement  H35.3134 Color Fundus Photography Optos - OU - Both Eyes  3. 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  Macula off retinal detachment, of uncertain onset yet with some features of some chronicity.  2.  We will plan surgical repair of the pseudophakic eye via vitrectomy, drainage of subretinal fluid, endolaser, likely gas injection as patient is able to comply he believes with not sleeping on his back.  3.  I have explained the patient and family that at the time of surgery if the retina is somewhat stiffer we may in fact need to also do primary silicone oil injection  Ophthalmic Meds Ordered this visit:  No orders of the defined types were placed in this encounter.      [facility], for Schedule repair OD, vitrectomy, laser, gas injection [id number].  There are no Patient Instructions on file for this visit.   Explained the diagnoses, plan, and follow up with the patient and they expressed understanding.  Patient expressed understanding of the importance of proper follow up care.   Clent Demark Rankin M.D. Diseases & Surgery of the Retina and Vitreous [facility]ina [facility] Di[facility] [place] 08/14/20     Abbreviations: M myopia (nearsighted); A astigmatism; H hyperopia (farsighted); P presbyopia; Mrx spectacle prescription;  CTL contact lenses; OD right eye; OS left eye; OU both eyes  XT exotropia; ET esotropia; PEK punctate epithelial keratitis; PEE punctate epithelial erosions; DES dry eye  syndrome; MGD meibomian gland dysfunction; ATs artificial tears; PFAT's preservative free artificial tears; [place] nuclear sclerotic cataract; PSC posterior subcapsular cataract; ERM epi-retinal membrane; PVD posterior vitreous detachment; RD retinal detachment; DM diabetes mellitus; DR diabetic retinopathy; NPDR non-proliferative diabetic retinopathy; PDR proliferative diabetic retinopathy; CSME clinically significant macular edema; DME diabetic macular edema; dbh dot blot hemorrhages; CWS cotton wool spot; POAG primary open angle glaucoma; C/D cup-to-disc ratio; HVF humphrey visual field; GVF goldmann visual field; OCT optical coherence tomography; IOP intraocular pressure; BRVO Branch retinal vein occlusion; CRVO central retinal vein occlusion; CRAO central retinal artery occlusion; BRAO branch retinal artery occlusion; RT retinal tear; SB scleral buckle; PPV pars plana vitrectomy; VH Vitreous hemorrhage; PRP panretinal laser photocoagulation; IVK intravitreal kenalog; VMT vitreomacular traction; MH Macular hole;  NVD neovascularization of the disc; NVE neovascularization elsewhere; AREDS age related eye disease study; ARMD age related macular degeneration; POAG primary open angle glaucoma; EBMD epithelial/anterior basement membrane dystrophy; ACIOL anterior chamber intraocular lens; IOL intraocular lens; PCIOL posterior chamber intraocular lens; Phaco/IOL phacoemulsification with intraocular lens placement; [place] photorefractive keratectomy; LASIK laser assisted in situ keratomileusis; HTN hypertension; DM diabetes mellitus; COPD chronic obstructive pulmonary disease

## 2020-08-14 NOTE — Assessment & Plan Note (Signed)
The nature of retinal detachment was discussed with the patient as well, as the treatment options which include pneumatic retinopexy, scleral buckling, and vitrectomy scleral buckling.  Possible side effects of the various surgical procedures were discussed with the patient.  Possible complications of surgery were discussed with the patient.  Explained the following success rates are involved with retinal detachment surgeries: 90% rate of reattachment success with primary operation, 85% chance of 2nd operation needed (if Pneumatic isn't done first), 70-75% chance of 3rd operation, & 50% chance of 4th & subsequent surgeries.   Patient understands that ongoing scarring of retina may lead to reoccurrence of retinal tears or detachments.  The patient's questions were answered. An informational brochure was given to the patient.  All the patient's questions were answered.  OD we will schedule repair via vitrectomy in the pseudophakic eye with drainage of subretinal fluid, laser retinopexy and injection intravitreal gas patient is able to lie on the right side down predominantly but is able to avoid sleeping on back

## 2020-08-14 NOTE — Assessment & Plan Note (Signed)

## 2020-08-16 ENCOUNTER — Encounter (AMBULATORY_SURGERY_CENTER): Payer: Medicare Other | Admitting: Ophthalmology

## 2020-08-16 DIAGNOSIS — H33021 Retinal detachment with multiple breaks, right eye: Secondary | ICD-10-CM | POA: Diagnosis not present

## 2020-08-16 DIAGNOSIS — H33001 Unspecified retinal detachment with retinal break, right eye: Secondary | ICD-10-CM | POA: Diagnosis not present

## 2020-08-17 ENCOUNTER — Encounter (INDEPENDENT_AMBULATORY_CARE_PROVIDER_SITE_OTHER): Payer: Medicare Other | Admitting: Ophthalmology

## 2020-08-17 ENCOUNTER — Ambulatory Visit (INDEPENDENT_AMBULATORY_CARE_PROVIDER_SITE_OTHER): Payer: Medicare Other | Admitting: Ophthalmology

## 2020-08-17 ENCOUNTER — Other Ambulatory Visit: Payer: Self-pay

## 2020-08-17 DIAGNOSIS — H33001 Unspecified retinal detachment with retinal break, right eye: Secondary | ICD-10-CM

## 2020-08-17 NOTE — Progress Notes (Signed)
08/17/2020     CHIEF COMPLAINT Patient presents for Post-op Follow-up (1 Day POV OD//Pt reports sleeping on L side, but sts sleeping on sofa was difficult due to discomfort positioning. Pt sts it was difficulty sleeping on his side in the bed, and sts, "I tried by best." Pt denies ocular pain. Pt denies nausea or vomiting following sx.)   HISTORY OF PRESENT ILLNESS: Tanner Ho is a 80 y.o. male who presents to the clinic today for:   HPI    Post-op Follow-up    In right eye. Additional comments: 1 Day POV OD  Pt reports sleeping on L side, but sts sleeping on sofa was difficult due to discomfort positioning. Pt sts it was difficulty sleeping on his side in the bed, and sts, "I tried by best." Pt denies ocular pain. Pt denies nausea or vomiting following sx.       Last edited by Rockie Neighbours, [place] on 08/17/2020  8:50 AM. (History)      Referring physician: [name], MD [address] [place],  [place] [address]  HISTORICAL INFORMATION:   Selected notes from the MEDICAL RECORD NUMBER       CURRENT MEDICATIONS: Current Outpatient Medications (Ophthalmic Drugs)  Medication Sig  . Hypromellose 0.2 % SOLN Apply to eye.  Vladimir Faster Glycol-Propyl Glycol (SYSTANE) 0.4-0.3 % SOLN Apply 2 drops to eye daily as needed (for dry eeys).  Marland Kitchen tobramycin-dexamethasone (TOBRADEX) ophthalmic solution Place 1-2 drops into the right eye 4 (four) times daily for 10 days.   No current facility-administered medications for this visit. (Ophthalmic Drugs)   Current Outpatient Medications (Other)  Medication Sig  . acetaminophen (TYLENOL 8 HOUR ARTHRITIS PAIN) 650 MG CR tablet Take 650 mg by mouth every 8 (eight) hours as needed for pain.  Marland Kitchen alfuzosin (UROXATRAL) 10 MG 24 hr tablet Take 10 mg by mouth daily.  Marland Kitchen amiodarone (PACERONE) 200 MG tablet Take 1 tablet (200 mg total) by mouth daily. Take a whole tablet every other day and half tablet the other days  . chlorthalidone  (HYGROTON) 25 MG tablet TAKE 1/2 TABLET (12.5 MG TOTAL) BY MOUTH 2 (TWO) TIMES A WEEK.  . Cholecalciferol (VITAMIN D-3 PO) Take 1 capsule by mouth daily.   . finasteride (PROSCAR) 5 MG tablet Take 5 mg by mouth daily.  . fish oil-omega-3 fatty acids 1000 MG capsule Take 1 g by mouth 2 (two) times daily.  . hydrocortisone 2.5 % cream Apply 1 application topically as needed.   . Levothyroxine Sodium 75 MCG CAPS Take 75 mcg by mouth daily.   Marland Kitchen linaclotide (LINZESS) 145 MCG CAPS capsule Take 1 capsule (145 mcg total) by mouth as needed.  . loratadine (CLARITIN) 10 MG tablet Take 10 mg by mouth daily.   Marland Kitchen losartan (COZAAR) 25 MG tablet Take 1 tablet (25 mg total) by mouth daily.  . metoprolol tartrate (LOPRESSOR) 25 MG tablet as needed.   . Multiple Vitamins-Minerals (PRESERVISION AREDS) CAPS Take 1 capsule by mouth 2 (two) times daily. VITAMIN A FREE  . NON FORMULARY Take by mouth.  . NON FORMULARY Take by mouth.  . Rivaroxaban (XARELTO) 15 MG TABS tablet Take 1 tablet (15 mg total) by mouth daily with supper.  . rosuvastatin (CRESTOR) 5 MG tablet TAKE 1 TABLET BY MOUTH DAILY AT 6 PM.  . vitamin B-12 (CYANOCOBALAMIN) 1000 MCG tablet Take 1,000 mcg by mouth daily.   No current facility-administered medications for this visit. (Other)  REVIEW OF SYSTEMS:    ALLERGIES Allergies  Allergen Reactions  . Ciprofloxacin Other (See Comments)    Cannot take due to currently taking Amiodarone  . Hytrin [Terazosin] Other (See Comments)    Syncopal event     PAST MEDICAL HISTORY Past Medical History:  Diagnosis Date  . Adrenal mass ([place])   . Asthma   . BPH (benign prostatic hyperplasia)   . CKD (chronic kidney disease), stage III ([place])   . GERD (gastroesophageal reflux disease)   . H/O cardiovascular stress test 03/2012   EKG with 1-2 mm ST segment depression but normal perfusion images. Followup metastases test reduced exercise effort and functional status and determinate for  myocardial dysfunction  . History of cardiac monitoring    a. 2015: cardiac monitor revealed episodes of PACs, bradycardia, with heart rates down to 48, 4 beat PSVT, 6 beat run of NSVT at a rate of 131. There was no recurrent afib.   Marland Kitchen Hyperlipidemia LDL goal < 100   . Hypertension   . Hypothyroidism   . Macular degeneration   . MVP (mitral valve prolapse)    on cardiac cath - not mentioned on 2014 echo.  Marland Kitchen NSVT (nonsustained ventricular tachycardia) ([place])   . PAF (paroxysmal atrial fibrillation) ([place])    last episode 03/2012  . Pericarditis   . Premature atrial contractions   . Sinus bradycardia    Past Surgical History:  Procedure Laterality Date  . ABDOMINAL VASCULAR ULTRASOUND EVALUATION  12/20/2008   mild non-obstructive plaque, abdominal aorta, with mild calcification at the bifurcation-no obstruction, no evidence for aneurysm of the abdominal aorta  . CARDIAC CATHETERIZATION  01/04/1999   hyperdynamic LV function, probable angiographic mitral valve prolapse, systolic muscle bridging of the mid LAD without obstructive disease, systolic narrowing up to 19%  . Cardiopulmonary MET-test  07/20/2012   reduced exercise effort and functional status, peak max O2 comsumption was only 58% of predicted, cardiovascular status was interpreted as indeterminate for myocardial dysfunction due to the suboptimal peak cardiovascular stress load.    FAMILY HISTORY Family History  Problem Relation Age of Onset  . Stroke Mother   . Stroke Paternal Grandfather     SOCIAL HISTORY Social History   Tobacco Use  . Smoking status: Former Research scientist (life sciences)  . Smokeless tobacco: Never Used  Vaping Use  . Vaping Use: Never used  Substance Use Topics  . Alcohol use: No  . Drug use: No         OPHTHALMIC EXAM:  Base Eye Exam    Visual Acuity (ETDRS)      Right Left   Dist [place] CF @ 1' 20/200   Dist ph [place] NI NI       Tonometry (Tonopen, 8:51 AM)      Right Left   Pressure 14 15       Pupils       Dark Light Shape React APD   Right 7 7 Round Dilated None   Left            Neuro/Psych    Oriented x3: Yes   Mood/Affect: Normal       Dilation    Right eye: 1.0% Mydriacyl, 2.5% Phenylephrine @ 8:55 AM        Slit Lamp and Fundus Exam    Slit Lamp Exam      Right Left   Lids/Lashes Normal Normal   Conjunctiva/Sclera 1+ Chemosis, 2+ Injection Subconjunctival hemorrhage, old minor and clearing   Cornea Clear  Clear   Anterior Chamber Deep and quiet Deep and quiet   Iris Round and reactive Round and reactive   Lens Posterior chamber intraocular lens Posterior chamber intraocular lens   Anterior Vitreous Clear vitrectomized Normal       Fundus Exam      Right Left   Posterior Vitreous Clear, vitrectomized, 95% gas    Disc Normal    C/D Ratio 0.0    Macula Advanced age related macular degeneration, Geographic atrophy 4 disc areas in size confluent, no hemorrhage, no macular thickening    Vessels Normal    Periphery Retina attached, good laser retinopexy           IMAGING AND PROCEDURES  Imaging and Procedures for 08/17/20           ASSESSMENT/PLAN:  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  Patient has some physical limitations and thus we spent some time discussing best positioning postop for the next 3 days particularly looking face down as much as physically in durable and possible in a reasonable fashion during his waking hours only        ICD-10-CM   1. 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  Patient instructed to resume topical medications  TobraDex 1 drop right eye 4 times daily  2.  Postoperative positioning reviewed look face down during waking hours as much as possible still taking needed bathroom breaks, consuming food, resting aching muscles and neck muscles  3.  Sleep or rest left side down but may sleep at night right side down for  maximum recovery  Ophthalmic Meds Ordered this visit:  No orders of the defined types were placed in this encounter.      Return in about 4 days (around 08/21/2020) for dilate, OD, POST OP, COLOR FP.  Patient Instructions  Patient instructed in positioning.  Optimal positioning while awake he isdown straight to the floor, and accomplished by any means physically possible and manageable.  Obviously the patient has permission and is required to take some breaks for muscle aches and pains as well as to place the eyedrops 4 times daily to the right eye.  Patient asked to rest on the left side down during the day but may also sleep on the right side down for comfort manageability of the  recovery process    Explained the diagnoses, plan, and follow up with the patient and they expressed understanding.  Patient expressed understanding of the importance of proper follow up care.   Clent Demark Rebecca Motta M.D. Diseases & Surgery of the Retina and Vitreous Retina & Diabetic [place] 08/17/20     Abbreviations: M myopia (nearsighted); A astigmatism; H hyperopia (farsighted); P presbyopia; Mrx spectacle prescription;  CTL contact lenses; OD right eye; OS left eye; OU both eyes  XT exotropia; ET esotropia; PEK punctate epithelial keratitis; PEE punctate epithelial erosions; DES dry eye syndrome; MGD meibomian gland dysfunction; ATs artificial tears; PFAT's preservative free artificial tears; [place] nuclear sclerotic cataract; PSC posterior subcapsular cataract; ERM epi-retinal membrane; PVD posterior vitreous detachment; RD retinal detachment; DM diabetes mellitus; DR diabetic retinopathy; NPDR non-proliferative diabetic retinopathy; PDR proliferative diabetic retinopathy; CSME clinically significant macular edema; DME diabetic macular edema; dbh dot blot hemorrhages; CWS cotton wool spot; POAG primary open angle glaucoma; C/D cup-to-disc ratio; HVF humphrey visual field; GVF goldmann visual field; OCT  optical coherence tomography; IOP intraocular pressure; BRVO Branch retinal vein  occlusion; CRVO central retinal vein occlusion; CRAO central retinal artery occlusion; BRAO branch retinal artery occlusion; RT retinal tear; SB scleral buckle; PPV pars plana vitrectomy; VH Vitreous hemorrhage; PRP panretinal laser photocoagulation; IVK intravitreal kenalog; VMT vitreomacular traction; MH Macular hole;  NVD neovascularization of the disc; NVE neovascularization elsewhere; AREDS age related eye disease study; ARMD age related macular degeneration; POAG primary open angle glaucoma; EBMD epithelial/anterior basement membrane dystrophy; ACIOL anterior chamber intraocular lens; IOL intraocular lens; PCIOL posterior chamber intraocular lens; Phaco/IOL phacoemulsification with intraocular lens placement; [place] photorefractive keratectomy; LASIK laser assisted in situ keratomileusis; HTN hypertension; DM diabetes mellitus; COPD chronic obstructive pulmonary disease

## 2020-08-17 NOTE — Patient Instructions (Signed)
Patient instructed in positioning.  Optimal positioning while awake he isdown straight to the floor, and accomplished by any means physically possible and manageable.  Obviously the patient has permission and is required to take some breaks for muscle aches and pains as well as to place the eyedrops 4 times daily to the right eye.  Patient asked to rest on the left side down during the day but may also sleep on the right side down for comfort manageability of the  recovery process

## 2020-08-17 NOTE — Assessment & Plan Note (Signed)
Postop day #1 looks great, retina completely attached.  Patient has some physical limitations and thus we spent some time discussing best positioning postop for the next 3 days particularly looking face down as much as physically in durable and possible in a reasonable fashion during his waking hours only

## 2020-08-21 ENCOUNTER — Ambulatory Visit (INDEPENDENT_AMBULATORY_CARE_PROVIDER_SITE_OTHER): Payer: Medicare Other | Admitting: Ophthalmology

## 2020-08-21 ENCOUNTER — Other Ambulatory Visit: Payer: Self-pay

## 2020-08-21 ENCOUNTER — Encounter (INDEPENDENT_AMBULATORY_CARE_PROVIDER_SITE_OTHER): Payer: Self-pay | Admitting: Ophthalmology

## 2020-08-21 DIAGNOSIS — H33001 Unspecified retinal detachment with retinal break, right eye: Secondary | ICD-10-CM | POA: Diagnosis not present

## 2020-08-21 NOTE — Assessment & Plan Note (Signed)
Looks great now 5 days status post surgical repair vitrectomy laser for macular on retinal detachment, and now post positioning facedown while awake for the last 3 days

## 2020-08-21 NOTE — Progress Notes (Signed)
08/21/2020     CHIEF COMPLAINT Patient presents for Post-op Follow-up (5 Day s/p vitrectomy, endolaser, gas injection OD/Pt states vision is blurry and "wiggly". Denies ocular pain. Using gtts as directed.)   HISTORY OF PRESENT ILLNESS: Tanner Ho is a 80 y.o. male who presents to the clinic today for:   HPI    Post-op Follow-up    In right eye.  Vision is stable.  I, the attending physician,  performed the HPI with the patient and updated documentation appropriately. Additional comments: 5 Day s/p vitrectomy, endolaser, gas injection OD Pt states vision is blurry and "wiggly". Denies ocular pain. Using gtts as directed.       Last edited by Tilda Franco on 08/21/2020  8:53 AM. (History)      Referring physician: [name], MD [address] [place],  [place] [address]  HISTORICAL INFORMATION:   Selected notes from the MEDICAL RECORD NUMBER       CURRENT MEDICATIONS: Current Outpatient Medications (Ophthalmic Drugs)  Medication Sig  . Hypromellose 0.2 % SOLN Apply to eye.  Vladimir Faster Glycol-Propyl Glycol (SYSTANE) 0.4-0.3 % SOLN Apply 2 drops to eye daily as needed (for dry eeys).  Marland Kitchen tobramycin-dexamethasone (TOBRADEX) ophthalmic solution Place 1-2 drops into the right eye 4 (four) times daily for 10 days.   No current facility-administered medications for this visit. (Ophthalmic Drugs)   Current Outpatient Medications (Other)  Medication Sig  . acetaminophen (TYLENOL 8 HOUR ARTHRITIS PAIN) 650 MG CR tablet Take 650 mg by mouth every 8 (eight) hours as needed for pain.  Marland Kitchen alfuzosin (UROXATRAL) 10 MG 24 hr tablet Take 10 mg by mouth daily.  Marland Kitchen amiodarone (PACERONE) 200 MG tablet Take 1 tablet (200 mg total) by mouth daily. Take a whole tablet every other day and half tablet the other days  . chlorthalidone (HYGROTON) 25 MG tablet TAKE 1/2 TABLET (12.5 MG TOTAL) BY MOUTH 2 (TWO) TIMES A WEEK.  . Cholecalciferol (VITAMIN D-3 PO) Take 1 capsule by  mouth daily.   . finasteride (PROSCAR) 5 MG tablet Take 5 mg by mouth daily.  . fish oil-omega-3 fatty acids 1000 MG capsule Take 1 g by mouth 2 (two) times daily.  . hydrocortisone 2.5 % cream Apply 1 application topically as needed.   . Levothyroxine Sodium 75 MCG CAPS Take 75 mcg by mouth daily.   Marland Kitchen linaclotide (LINZESS) 145 MCG CAPS capsule Take 1 capsule (145 mcg total) by mouth as needed.  . loratadine (CLARITIN) 10 MG tablet Take 10 mg by mouth daily.   Marland Kitchen losartan (COZAAR) 25 MG tablet Take 1 tablet (25 mg total) by mouth daily.  . metoprolol tartrate (LOPRESSOR) 25 MG tablet as needed.   . Multiple Vitamins-Minerals (PRESERVISION AREDS) CAPS Take 1 capsule by mouth 2 (two) times daily. VITAMIN A FREE  . NON FORMULARY Take by mouth.  . NON FORMULARY Take by mouth.  . Rivaroxaban (XARELTO) 15 MG TABS tablet Take 1 tablet (15 mg total) by mouth daily with supper.  . rosuvastatin (CRESTOR) 5 MG tablet TAKE 1 TABLET BY MOUTH DAILY AT 6 PM.  . vitamin B-12 (CYANOCOBALAMIN) 1000 MCG tablet Take 1,000 mcg by mouth daily.   No current facility-administered medications for this visit. (Other)      REVIEW OF SYSTEMS:    ALLERGIES Allergies  Allergen Reactions  . Ciprofloxacin Other (See Comments)    Cannot take due to currently taking Amiodarone  . Hytrin [Terazosin] Other (See Comments)  Syncopal event     PAST MEDICAL HISTORY Past Medical History:  Diagnosis Date  . Adrenal mass ([place])   . Asthma   . BPH (benign prostatic hyperplasia)   . CKD (chronic kidney disease), stage III ([place])   . GERD (gastroesophageal reflux disease)   . H/O cardiovascular stress test 03/2012   EKG with 1-2 mm ST segment depression but normal perfusion images. Followup metastases test reduced exercise effort and functional status and determinate for myocardial dysfunction  . History of cardiac monitoring    a. 2015: cardiac monitor revealed episodes of PACs, bradycardia, with heart rates down to  48, 4 beat PSVT, 6 beat run of NSVT at a rate of 131. There was no recurrent afib.   Marland Kitchen Hyperlipidemia LDL goal < 100   . Hypertension   . Hypothyroidism   . Macular degeneration   . MVP (mitral valve prolapse)    on cardiac cath - not mentioned on 2014 echo.  Marland Kitchen NSVT (nonsustained ventricular tachycardia) ([place])   . PAF (paroxysmal atrial fibrillation) ([place])    last episode 03/2012  . Pericarditis   . Premature atrial contractions   . Sinus bradycardia    Past Surgical History:  Procedure Laterality Date  . ABDOMINAL VASCULAR ULTRASOUND EVALUATION  12/20/2008   mild non-obstructive plaque, abdominal aorta, with mild calcification at the bifurcation-no obstruction, no evidence for aneurysm of the abdominal aorta  . CARDIAC CATHETERIZATION  01/04/1999   hyperdynamic LV function, probable angiographic mitral valve prolapse, systolic muscle bridging of the mid LAD without obstructive disease, systolic narrowing up to 01%  . Cardiopulmonary MET-test  07/20/2012   reduced exercise effort and functional status, peak max O2 comsumption was only 58% of predicted, cardiovascular status was interpreted as indeterminate for myocardial dysfunction due to the suboptimal peak cardiovascular stress load.    FAMILY HISTORY Family History  Problem Relation Age of Onset  . Stroke Mother   . Stroke Paternal Grandfather     SOCIAL HISTORY Social History   Tobacco Use  . Smoking status: Former Research scientist (life sciences)  . Smokeless tobacco: Never Used  Vaping Use  . Vaping Use: Never used  Substance Use Topics  . Alcohol use: No  . Drug use: No         OPHTHALMIC EXAM:  Base Eye Exam    Visual Acuity (Snellen - Linear)      Right Left   Dist [place] CF @ 1' 20/200   Dist ph [place] NI NI       Tonometry (Tonopen, 8:58 AM)      Right Left   Pressure 17 15       Pupils      Pupils Dark Light Shape React APD   Right PERRL 5 4 Round [place]   Left PERRL 5 4 Round Brisk None       Neuro/Psych     Oriented x3: Yes   Mood/Affect: Normal       Dilation    Right eye: 1.0% Mydriacyl, 2.5% Phenylephrine @ 8:58 AM        Slit Lamp and Fundus Exam    External Exam      Right Left   External Normal        Slit Lamp Exam      Right Left   Lids/Lashes Normal Normal   Conjunctiva/Sclera 2+ Chemosis, 2+ Injection Subconjunctival hemorrhage, old minor and clearing   Cornea Clear Clear   Anterior Chamber Deep and quiet Deep and quiet  Iris Round and reactive Round and reactive   Lens Posterior chamber intraocular lens Posterior chamber intraocular lens   Anterior Vitreous Clear vitrectomized Normal       Fundus Exam      Right Left   Posterior Vitreous Clear, vitrectomized, 90% gas    Disc Normal    C/D Ratio 0.0    Macula Advanced age related macular degeneration, Geographic atrophy 4 disc areas in size confluent, no hemorrhage, no macular thickening    Vessels Normal    Periphery Retina attached, good laser retinopexy, temporal and inferorly           IMAGING AND PROCEDURES  Imaging and Procedures for 08/21/20  Color Fundus Photography Optos - OU - Both Eyes       Right Eye Progression has improved. Macula : geographic atrophy. Vessels : normal observations.   Left Eye Progression has been stable. Disc findings include normal observations. Macula : geographic atrophy. Vessels : normal observations. Periphery : normal observations.   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   Postop positioning of the right eye with facedown while awake has successfully maintained retinal reattached ,  OD we will continue to monitor and observe.  Patient must avoid sleeping or laying on back still for the next 7 weeks.  2.  Patient was also avoid travel to elevated places like [place], [place].  3.  Patient instructed to resume and continue topical eye medications as before  Patient may resume routine activity without regard to strict facedown positioning at this time.  Ophthalmic Meds Ordered this visit:  No orders of the defined types were placed in this encounter.      Return in about 1 week (around 08/28/2020) for dilate, OD, COLOR FP, POST OP.  There are no Patient Instructions on file for this visit.   Explained the diagnoses, plan, and follow up with the patient and they expressed understanding.  Patient expressed understanding of the importance of proper follow up care.   Clent Demark Sharnay Cashion M.D. Diseases & Surgery of the Retina and Vitreous Retina & Diabetic [place] 08/21/20     Abbreviations: M myopia (nearsighted); A astigmatism; H hyperopia (farsighted); P presbyopia; Mrx spectacle prescription;  CTL contact lenses; OD right eye; OS left eye; OU both eyes  XT exotropia; ET esotropia; PEK punctate epithelial keratitis; PEE punctate epithelial erosions; DES dry eye syndrome; MGD meibomian gland dysfunction; ATs artificial tears; PFAT's preservative free artificial tears; [place] nuclear sclerotic cataract; PSC posterior subcapsular cataract; ERM epi-retinal membrane; PVD posterior vitreous detachment; RD retinal detachment; DM diabetes mellitus; DR diabetic retinopathy; NPDR non-proliferative diabetic retinopathy; PDR proliferative diabetic retinopathy; CSME clinically significant macular edema; DME diabetic macular edema; dbh dot blot hemorrhages; CWS cotton wool spot; POAG primary open angle glaucoma; C/D cup-to-disc ratio; HVF humphrey visual field; GVF goldmann  visual field; OCT optical coherence tomography; IOP intraocular pressure; BRVO Branch retinal vein occlusion; CRVO central retinal vein occlusion;  CRAO central retinal artery occlusion; BRAO branch retinal artery occlusion; RT retinal tear; SB scleral buckle; PPV pars plana vitrectomy; VH Vitreous hemorrhage; PRP panretinal laser photocoagulation; IVK intravitreal kenalog; VMT vitreomacular traction; MH Macular hole;  NVD neovascularization of the disc; NVE neovascularization elsewhere; AREDS age related eye disease study; ARMD age related macular degeneration; POAG primary open angle glaucoma; EBMD epithelial/anterior basement membrane dystrophy; ACIOL anterior chamber intraocular lens; IOL intraocular lens; PCIOL posterior chamber intraocular lens; Phaco/IOL phacoemulsification with intraocular lens placement; [place] photorefractive keratectomy; LASIK laser assisted in situ keratomileusis; HTN hypertension; DM diabetes mellitus; COPD chronic obstructive pulmonary disease

## 2020-08-24 ENCOUNTER — Encounter (INDEPENDENT_AMBULATORY_CARE_PROVIDER_SITE_OTHER): Payer: Medicare Other | Admitting: Ophthalmology

## 2020-08-28 ENCOUNTER — Encounter (INDEPENDENT_AMBULATORY_CARE_PROVIDER_SITE_OTHER): Payer: Medicare Other | Admitting: Ophthalmology

## 2020-08-28 ENCOUNTER — Other Ambulatory Visit: Payer: Self-pay

## 2020-08-28 ENCOUNTER — Encounter (INDEPENDENT_AMBULATORY_CARE_PROVIDER_SITE_OTHER): Payer: Self-pay | Admitting: Ophthalmology

## 2020-08-28 ENCOUNTER — Ambulatory Visit (INDEPENDENT_AMBULATORY_CARE_PROVIDER_SITE_OTHER): Payer: Medicare Other | Admitting: Ophthalmology

## 2020-08-28 DIAGNOSIS — H33001 Unspecified retinal detachment with retinal break, right eye: Secondary | ICD-10-CM | POA: Diagnosis not present

## 2020-08-28 MED ORDER — TOBRAMYCIN-DEXAMETHASONE 0.3-0.1 % OP SUSP: 1.0000 [drp] | Freq: Four times a day (QID) | OPHTHALMIC | 1 refills | Status: AC

## 2020-08-28 NOTE — Progress Notes (Signed)
08/28/2020     CHIEF COMPLAINT Patient presents for Post-op Follow-up (1 Week s/p vitrectomy, endolaser, gas inj OD /Pt denies pain/discomfort. Using gtts as directed.)   HISTORY OF PRESENT ILLNESS: Tanner Ho is a 80 y.o. male who presents to the clinic today for:   HPI    Post-op Follow-up    In right eye.  Discomfort includes none.  Vision is stable.  I, the attending physician,  performed the HPI with the patient and updated documentation appropriately. Additional comments: 1 Week s/p vitrectomy, endolaser, gas inj OD  Pt denies pain/discomfort. Using gtts as directed.       Last edited by Tilda Franco on 08/28/2020  8:19 AM. (History)      Referring physician: [name], MD [address] [place],  [place] [address]  HISTORICAL INFORMATION:   Selected notes from the MEDICAL RECORD NUMBER       CURRENT MEDICATIONS: Current Outpatient Medications (Ophthalmic Drugs)  Medication Sig  . Hypromellose 0.2 % SOLN Apply to eye.  Vladimir Faster Glycol-Propyl Glycol (SYSTANE) 0.4-0.3 % SOLN Apply 2 drops to eye daily as needed (for dry eeys).  Marland Kitchen tobramycin-dexamethasone (TOBRADEX) ophthalmic solution Place 1-2 drops into the right eye 4 (four) times daily for 10 days.   No current facility-administered medications for this visit. (Ophthalmic Drugs)   Current Outpatient Medications (Other)  Medication Sig  . acetaminophen (TYLENOL 8 HOUR ARTHRITIS PAIN) 650 MG CR tablet Take 650 mg by mouth every 8 (eight) hours as needed for pain.  Marland Kitchen alfuzosin (UROXATRAL) 10 MG 24 hr tablet Take 10 mg by mouth daily.  Marland Kitchen amiodarone (PACERONE) 200 MG tablet Take 1 tablet (200 mg total) by mouth daily. Take a whole tablet every other day and half tablet the other days  . chlorthalidone (HYGROTON) 25 MG tablet TAKE 1/2 TABLET (12.5 MG TOTAL) BY MOUTH 2 (TWO) TIMES A WEEK.  . Cholecalciferol (VITAMIN D-3 PO) Take 1 capsule by mouth daily.   . finasteride (PROSCAR) 5 MG  tablet Take 5 mg by mouth daily.  . fish oil-omega-3 fatty acids 1000 MG capsule Take 1 g by mouth 2 (two) times daily.  . hydrocortisone 2.5 % cream Apply 1 application topically as needed.   . Levothyroxine Sodium 75 MCG CAPS Take 75 mcg by mouth daily.   Marland Kitchen linaclotide (LINZESS) 145 MCG CAPS capsule Take 1 capsule (145 mcg total) by mouth as needed.  . loratadine (CLARITIN) 10 MG tablet Take 10 mg by mouth daily.   Marland Kitchen losartan (COZAAR) 25 MG tablet Take 1 tablet (25 mg total) by mouth daily.  . metoprolol tartrate (LOPRESSOR) 25 MG tablet as needed.   . Multiple Vitamins-Minerals (PRESERVISION AREDS) CAPS Take 1 capsule by mouth 2 (two) times daily. VITAMIN A FREE  . NON FORMULARY Take by mouth.  . NON FORMULARY Take by mouth.  . Rivaroxaban (XARELTO) 15 MG TABS tablet Take 1 tablet (15 mg total) by mouth daily with supper.  . rosuvastatin (CRESTOR) 5 MG tablet TAKE 1 TABLET BY MOUTH DAILY AT 6 PM.  . vitamin B-12 (CYANOCOBALAMIN) 1000 MCG tablet Take 1,000 mcg by mouth daily.   No current facility-administered medications for this visit. (Other)      REVIEW OF SYSTEMS:    ALLERGIES Allergies  Allergen Reactions  . Ciprofloxacin Other (See Comments)    Cannot take due to currently taking Amiodarone  . Hytrin [Terazosin] Other (See Comments)    Syncopal event  PAST MEDICAL HISTORY Past Medical History:  Diagnosis Date  . Adrenal mass ([place])   . Asthma   . BPH (benign prostatic hyperplasia)   . CKD (chronic kidney disease), stage III ([place])   . GERD (gastroesophageal reflux disease)   . H/O cardiovascular stress test 03/2012   EKG with 1-2 mm ST segment depression but normal perfusion images. Followup metastases test reduced exercise effort and functional status and determinate for myocardial dysfunction  . History of cardiac monitoring    a. 2015: cardiac monitor revealed episodes of PACs, bradycardia, with heart rates down to 48, 4 beat PSVT, 6 beat run of NSVT at a rate  of 131. There was no recurrent afib.   Marland Kitchen Hyperlipidemia LDL goal < 100   . Hypertension   . Hypothyroidism   . Macular degeneration   . MVP (mitral valve prolapse)    on cardiac cath - not mentioned on 2014 echo.  Marland Kitchen NSVT (nonsustained ventricular tachycardia) ([place])   . PAF (paroxysmal atrial fibrillation) ([place])    last episode 03/2012  . Pericarditis   . Premature atrial contractions   . Sinus bradycardia    Past Surgical History:  Procedure Laterality Date  . ABDOMINAL VASCULAR ULTRASOUND EVALUATION  12/20/2008   mild non-obstructive plaque, abdominal aorta, with mild calcification at the bifurcation-no obstruction, no evidence for aneurysm of the abdominal aorta  . CARDIAC CATHETERIZATION  01/04/1999   hyperdynamic LV function, probable angiographic mitral valve prolapse, systolic muscle bridging of the mid LAD without obstructive disease, systolic narrowing up to 69%  . Cardiopulmonary MET-test  07/20/2012   reduced exercise effort and functional status, peak max O2 comsumption was only 58% of predicted, cardiovascular status was interpreted as indeterminate for myocardial dysfunction due to the suboptimal peak cardiovascular stress load.    FAMILY HISTORY Family History  Problem Relation Age of Onset  . Stroke Mother   . Stroke Paternal Grandfather     SOCIAL HISTORY Social History   Tobacco Use  . Smoking status: Former Research scientist (life sciences)  . Smokeless tobacco: Never Used  Vaping Use  . Vaping Use: Never used  Substance Use Topics  . Alcohol use: No  . Drug use: No         OPHTHALMIC EXAM: Base Eye Exam    Visual Acuity (Snellen - Linear)      Right Left   Dist [place] CF @ 1' 20/200 -1   Dist ph [place] NI NI       Tonometry (Tonopen, 8:23 AM)      Right Left   Pressure 10 9       Pupils      Pupils Dark Light Shape React APD   Right PERRL 5 4 Round Sluggish +1   Left PERRL 5 4 Round Slow None       Neuro/Psych    Oriented x3: Yes   Mood/Affect: Normal       Dilation     Right eye: 1.0% Mydriacyl, 2.5% Phenylephrine @ 8:23 AM        Slit Lamp and Fundus Exam    External Exam      Right Left   External Normal        Slit Lamp Exam      Right Left   Lids/Lashes Normal Normal   Conjunctiva/Sclera 2+ Chemosis, 2+ Injection Subconjunctival hemorrhage, old minor and clearing   Cornea Clear Clear   Anterior Chamber Deep and quiet Deep and quiet   [place] Round and reactive Round  and reactive   Lens Posterior chamber intraocular lens Posterior chamber intraocular lens   Anterior Vitreous Clear vitrectomized Normal       Fundus Exam      Right Left   Posterior Vitreous Clear, vitrectomized, 70% gas    Disc Normal    C/D Ratio 0.0    Macula Advanced age related macular degeneration, Geographic atrophy 4 disc areas in size confluent, no hemorrhage, no macular thickening    Vessels Normal    Periphery Retina attached, good laser retinopexy, temporal and inferorly           IMAGING AND PROCEDURES  Imaging and Procedures for 08/28/20  Color Fundus Photography Optos - OU - Both Eyes       Right Eye Progression has improved. Macula : geographic atrophy. Vessels : normal observations.   Left Eye Progression has been stable. Disc findings include normal observations. Macula : geographic atrophy. Vessels : normal observations. Periphery : normal observations.   Notes Good retinopexy temporally and inferiorly at the sites of subretinal fluid drainage OD, a attached, good retinopexy.  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  Good retinopexy still visible through the gas bubble.  Good positioning.  Macula attached periphery attached inferiorly attached.  Will continue to observe do not sleep or lay on back do not travel mountains or airplanes  10 you topical  TobraDex 1 drop right eye 4 times daily for next 2 weeks.      ICD-10-CM   1. 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  Retina attached.  No new breaks.  To new topical medications  TobraDex 1 drop right eye 4 times daily  2.  3.  Ophthalmic Meds Ordered this visit:  Meds ordered this encounter  Medications  . tobramycin-dexamethasone (TOBRADEX) ophthalmic solution    Sig: Place 1-2 drops into the right eye 4 (four) times daily for 10 days.    Dispense:  5 mL    Refill:  1       Return in about 2 weeks (around 09/11/2020) for dilate, OD, COLOR FP.  There are no Patient Instructions on file for this visit.   Explained the diagnoses, plan, and follow up with the patient and they expressed understanding.  Patient expressed understanding of the importance of proper follow up care.   Clent Demark Rankin M.D. Diseases & Surgery of the Retina and Vitreous [facility] 08/28/20     Abbreviations: M myopia (nearsighted); A astigmatism; H hyperopia (farsighted); P presbyopia; Mrx spectacle prescription;  CTL contact lenses; OD right eye; OS left eye; OU both eyes  XT exotropia; ET esotropia; PEK punctate epithelial keratitis; PEE punctate epithelial erosions; DES dry eye syndrome; MGD meibomian gland dysfunction; ATs artificial tears; PFAT's preservative free artificial tears; [place] nuclear sclerotic cataract; PSC posterior subcapsular cataract; ERM epi-retinal membrane; PVD posterior vitreous detachment; RD retinal detachment; DM diabetes mellitus; DR diabetic retinopathy; NPDR non-proliferative diabetic retinopathy; PDR proliferative diabetic retinopathy; CSME clinically significant macular edema; DME diabetic macular edema; dbh dot blot hemorrhages; CWS cotton wool spot; POAG primary open angle glaucoma; C/D cup-to-disc ratio; HVF humphrey visual field;  GVF goldmann visual field; OCT optical coherence tomography; IOP intraocular pressure; BRVO Branch retinal vein occlusion; CRVO central retinal vein occlusion; CRAO central retinal artery occlusion; BRAO branch retinal artery occlusion; RT retinal tear; SB scleral buckle; PPV pars plana vitrectomy; VH Vitreous hemorrhage; PRP panretinal laser photocoagulation; IVK intravitreal kenalog; VMT vitreomacular traction; MH Macular hole;  NVD neovascularization of the disc; NVE neovascularization elsewhere; AREDS age related eye disease study; ARMD age related macular degeneration; POAG primary open angle glaucoma; EBMD epithelial/anterior basement membrane dystrophy; ACIOL anterior chamber intraocular lens; IOL intraocular lens; PCIOL posterior chamber intraocular lens; Phaco/IOL phacoemulsification with intraocular lens placement; [place] photorefractive keratectomy; LASIK laser assisted in situ keratomileusis; HTN hypertension; DM diabetes mellitus; COPD chronic obstructive pulmonary disease

## 2020-08-28 NOTE — Assessment & Plan Note (Signed)
Now 12 days status post repair rhegmatogenous retinal detachment, macula on.  Good retinopexy still visible through the gas bubble.  Good positioning.  Macula attached periphery attached inferiorly attached.  Will continue to observe do not sleep or lay on back do not travel mountains or airplanes  10 you topical TobraDex 1 drop right eye 4 times daily for next 2 weeks.

## 2020-09-04 ENCOUNTER — Other Ambulatory Visit: Payer: Self-pay

## 2020-09-04 ENCOUNTER — Encounter: Payer: Self-pay | Admitting: Cardiovascular Disease

## 2020-09-04 ENCOUNTER — Ambulatory Visit (INDEPENDENT_AMBULATORY_CARE_PROVIDER_SITE_OTHER): Payer: Medicare Other | Admitting: Cardiovascular Disease

## 2020-09-04 VITALS — BP 147/65 | HR 71 | Ht 68.0 in | Wt 160.0 lb

## 2020-09-04 DIAGNOSIS — E785 Hyperlipidemia, unspecified: Secondary | ICD-10-CM

## 2020-09-04 DIAGNOSIS — I480 Paroxysmal atrial fibrillation: Secondary | ICD-10-CM

## 2020-09-04 DIAGNOSIS — N1832 Chronic kidney disease, stage 3b: Secondary | ICD-10-CM | POA: Diagnosis not present

## 2020-09-04 DIAGNOSIS — E039 Hypothyroidism, unspecified: Secondary | ICD-10-CM | POA: Diagnosis not present

## 2020-09-04 DIAGNOSIS — I10 Essential (primary) hypertension: Secondary | ICD-10-CM | POA: Diagnosis not present

## 2020-09-04 DIAGNOSIS — Z7901 Long term (current) use of anticoagulants: Secondary | ICD-10-CM | POA: Diagnosis not present

## 2020-09-04 NOTE — Patient Instructions (Signed)
Medication Instructions:  The current medical regimen is effective;  continue present plan and medications.  *If you need a refill on your cardiac medications before your next appointment, please call your pharmacy*   Follow-Up: At [facility], you and your health needs are our priority.  As part of our continuing mission to provide you with exceptional heart care, we have created designated Provider Care Teams.  These Care Teams include your primary Cardiologist (physician) and Advanced Practice Providers (APPs -  Physician Assistants and Nurse Practitioners) who all work together to provide you with the care you need, when you need it.  We recommend signing up for the patient portal called "MyChart".  Sign up information is provided on this After Visit Summary.  MyChart is used to connect with patients for Virtual Visits (Telemedicine).  Patients are able to view lab/test results, encounter notes, upcoming appointments, etc.  Non-urgent messages can be sent to your provider as well.   To learn more about what you can do with MyChart, go to [email or web address].    Your next appointment:   6 month(s)  The format for your next appointment:   In Person  Provider:   Thomas Kelly, MD    

## 2020-09-04 NOTE — Progress Notes (Signed)
Patient ID: Tanner Ho, male   DOB: Jan 14, 1941, 80 y.o.   MRN: [id number]    Primary M.D.: Dr. [name]  HPI: Tanner Ho is a 80 y.o. male who presents to the office today for a 13 month follow-up cardiology evaluation.  [name] has a history of mitral valve prolapse, hypertension, hyperlipidemia, hypothyroidism, as well as paroxysmal atrial fibrillation. He is on chronic Coumadin anticoagulation. His last documented atrial fibrillation episode was in October 2013. A nuclear perfusion study in October 2013 revealed normal perfusion imaging but he developed 1-2 mm of ST segment depression at peak stress test and the possibility of microvascular etiology leading to his ECG abnormalities. A cardiopulmonary met test on 09/17/2012 demonstarated a reduced peak maximum oxygen consumption at 58%. He had a suboptimal peak cardiovascular stress load making cardiovascular status interpretation indeterminate. He did have mild ventilation/perfusion mismatch suggesting impaired ulnar circulation plus minus increased dead space. He had a blunted chronotropic response to exercise. The test was limited by leg fatigue. When I saw him in the office subsequently I reduced his Cardizem from 240 to180 mg. At times, he notes a rare palpitation. He denies any awareness of breakthrough atrial fibrillation.   He presented to the emergency room in November 2014 with some vague chest pain. In retrospect, he feels this may have been GERD symptoms. Cardiac enzymes were negative. ECG was without changes. A nuclear perfusion study on 04/22/2013 was low risk and showed mild diaphragmatic attenuation but did not show a region of scar or ischemia, unchanged from his prior study of several years previously.  In 2015 he had noticed some dizziness and decreased energy   A cardiac monitor revealed episodes of PACs, bradycardia, with heart rates down to 48, but he also had a 6 beat run of wide-complex tachycardia at a rate of 131.   Remotely, he had developed breakthrough atrial fibrillation on amiodarone 100 mg and he has been on 100 mg, alternating with 200 mg daily.  On this most recent monitor, there were no episodes of recurrent AF.   He has been on amiodarone 100 mg daily, Norvasc 2.5 mg, losartan 50 mg bid, HCTZ 12.5 mg daily, Toprol-XL 12.5 mg daily, Crestor 5 mg and Coumadin.  He also has noticed rare episodes of vertigo for which he takes meclizine.    He also has been on Uroxatral to improve urinary flow. When I last saw him we had a lengthy discussion concerning warfarin versus new or anticoagulants.  He opted to switch to Xarelto and has been maintained on 15 mg daily based on his creatinine clearance.  He has tolerated this well without bleeding.  He was evaluated by Melina Copa the office on 12/27/2015 and had noticed an occasional irregularity of his heartbeat.  When she evaluated him, he was still in sinus rhythm without ectopy and there were no acute changes.  Presently continues to feel that his pulse has been stable.  He denies chest pain.  He denies presyncope or syncope.  When I saw him in follow-up, he was in sinus rhythm.  When I saw 1 week later for a follow-up cardiology evaluation, he was very stable.  He was denying any episodes of palpitations.  He is not required use of when necessary metoprolol.  His ECG revealed sinus bradycardia at 58 bpm.  He was unaware of any any heart rate irregularity. Dr. Shelia Media had checked his laboratory.  He denied any episodes of chest pain, presyncope or syncope.  He developed recurrent atrial fibrillation with a rapid ventricular response and presented to [facility] ER on March 15.  He was given metoprolol 5 mg intravenously which brought his heart rate down.  He went home after several hours.  The following morning he was advised to increase amiodarone from 100 mg to 200 mg daily.  That night, again he develop recurrent AF with rapid ventricular response and repeat presented to the  emergency room on 08/16/2016.  At this time.  He was given metoprolol IV 2.5 mg 2.  He also was advised to take metoprolol 50 mg twice a day.  On this increased beta blocker dose, his blood pressure became low and he called and spoke with Rosaria Ferries several days later.  His medications were adjusted and he was told to stop amlodipine, reduce losartan to 25 mg, and reduce metoprolol to 25 mg twice a day.  When I last saw him 3 weeks ago, he was in atrial fibrillation with ventricular rate in the 90s.  I recommended further titration of amiodarone and increase this to 200 mg twice a day.  Also is on levothyroxine at 50 g.  TSH was normal at 3.38.  He has stage III chronic kidney disease.  He underwent a 2-D echo Doppler study on 09/04/2016 and was still in atrial fibrillation at that time.  Ejection fraction was 55-60%.  There was mild late systolic prolapse of both leaflets with mild MR.  There was mild PA pressure elevation at 39 mm.  Aortic root dimension measured 39 mm.  On the following evening, he began to notice his heart rate reducing down into the upper 40s to 50s and his rhythm appeared more regular. He called the office and was advised to reduce his metoprolol dose.  He also a completely discontinue this altogether.  He now notes his pulse typically in the 60s.  His blood pressure has risen to the 140s. He presents for reevaluation.  When I saw him in April 2018, he was still in atrial fibrillation.  I recommended he continue amiodarone 200 mg twice a day at least for the next month.  At that time, I recommended he defer undergoing his YAG laser surgery procedure until his atrial fibrillation was controlled.  When he was seen in May 2018 , he had converted to sinus rhythm and with  pharmacologic cardioversion, he develops sinus bradycardia with rates in the upper 40s.  His metoprolol dose was reduced and ultimately discontinued.  Over the past several months, he  continued to feel well.   When I  saw him in September 2018, he was in normal sinus rhythm at 64 bpm.  With a creatinine of 1.78  I recommended that he decrease HCTZ to just 2 times per week depending upon his swelling and blood pressure.  He had called the office several weeks later complaining of increased heart rate irregularity and was seen on March 27, 2017.  His ECG revealed sinus rhythm at 71 bpm with occasional PACs occurring almost every 6 or 7 beats.  I recommended initiation of low-dose metoprolol at 12.5 twice a day.  Was to monitor his blood pressure.  He continued to be on Xarelto without bleeding.  There was no edema and reduced dose of HCTZ.  I  saw him in November 2019 and prior to that evaluation he had seen the pharmacist on several occasions.  Most recently he has been on amiodarone 200 mg daily, chlorthalidone 12.5 mg on Monday Wednesday and Fridays,  losartan 50 mg in the morning and 25 mg in the evening and apparently is no longer taking metoprolol.  He has felt well.  He denies chest pain shortness of breath or palpitations.  He does not believe he has had any recurrent A. fib over the past year.  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    I saw him in November 2020 and over the previous year, he has felt fairly well.  He was unaware of any recurrent atrial fibrillation.  His swelling has significantly resolved on his regimen consisting of chlorthalidone 12.5 mg on Monday Wednesday and Friday.  He continues to be on amiodarone 200 mg daily.  He is on losartan 50 mg in the morning and 25 mg at night, and has a prescription for metoprolol tartrate but has not required use.  He has hypothyroidism on levothyroxine at 75 mcg.  He continues to be on rosuvastatin 5 mg for hyperlipidemia.  He is on Xarelto at 15 mg dosing daily.  He had undergone laboratory 5 days prior to his evaluation and with his BUN increasing to 40 and creatinine to 1.87 from 1 year ago I recommended he decrease losartan to 25  mg twice a day and decrease chlorthalidone to at minimum twice per week.  His reduced renal function he was on a reduced dose of Xarelto at 15 mg.  Repeat laboratory on February 23 showed a creatinine of 1.8, and losartan was further reduced to 25 mg daily.  More recent lab on August 19, 2019 showed slight improvement in creatinine at 1.75 with a BUN improved at 30 with estimated GFR at 36 consistent with stage IIIb chronic kidney disease.  I last saw him on August 20, 2019 at which time he continued to experience mild ankle edema bilaterally.  He continued to be on amiodarone 20 mg daily and at times he did get bradycardic with heart rates in the 40s.  He was unaware of palpitations.  He continued to take rosuvastatin and Lovaza for mixed hyperlipidemia.  During that evaluation I recommended he reduce his amiodarone down to 200 mg alternating with 100 mg every other day.  Since he was having leg edema, with his renal insufficiency rather than further increase his diuretic I recommended compression stockings at 20-30's millimeters of pressure.  Since I last saw him, he was evaluated by Almyra Deforest, PA on March 02, 2020 and a telemedicine visit.  Presently, he has felt well.  He had recently seen Dr. 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 Deloria Lair who performed surgery on August 16, 2020.  He denies any presyncope or syncope.  His leg swelling has improved.  He presents for reevaluation.   Past Medical History:  Diagnosis Date  . Adrenal mass ([place])   . Asthma   . BPH (benign prostatic hyperplasia)   . CKD (chronic kidney disease), stage III ([place])   . GERD (gastroesophageal reflux disease)   . H/O cardiovascular stress test 03/2012   EKG with 1-2 mm ST segment depression but normal perfusion images. Followup metastases test reduced exercise effort and functional status and  determinate for myocardial dysfunction  . History of cardiac monitoring    a. 2015: cardiac monitor revealed episodes of PACs, bradycardia, with heart rates down to 48, 4 beat PSVT, 6 beat run of NSVT at a  rate of 131. There was no recurrent afib.   Marland Kitchen Hyperlipidemia LDL goal < 100   . Hypertension   . Hypothyroidism   . Macular degeneration   . MVP (mitral valve prolapse)    on cardiac cath - not mentioned on 2014 echo.  Marland Kitchen NSVT (nonsustained ventricular tachycardia) ([facility])   . PAF (paroxysmal atrial fibrillation) ([facility])    last episode 03/2012  . Pericarditis   . Premature atrial contractions   . Sinus bradycardia     Past Surgical History:  Procedure Laterality Date  . ABDOMINAL VASCULAR ULTRASOUND EVALUATION  12/20/2008   mild non-obstructive plaque, abdominal aorta, with mild calcification at the bifurcation-no obstruction, no evidence for aneurysm of the abdominal aorta  . CARDIAC CATHETERIZATION  01/04/1999   hyperdynamic LV function, probable angiographic mitral valve prolapse, systolic muscle bridging of the mid LAD without obstructive disease, systolic narrowing up to 70%  . Cardiopulmonary MET-test  07/20/2012   reduced exercise effort and functional status, peak max O2 comsumption was only 58% of predicted, cardiovascular status was interpreted as indeterminate for myocardial dysfunction due to the suboptimal peak cardiovascular stress load.    Allergies  Allergen Reactions  . Ciprofloxacin Other (See Comments)    Cannot take due to currently taking Amiodarone  . Hytrin [Terazosin] Other (See Comments)    Syncopal event     Current Outpatient Medications  Medication Sig Dispense Refill  . acetaminophen (TYLENOL) 650 MG CR tablet Take 650 mg by mouth every 8 (eight) hours as needed for pain.    Marland Kitchen alfuzosin (UROXATRAL) 10 MG 24 hr tablet Take 10 mg by mouth daily.    Marland Kitchen amiodarone (PACERONE) 200 MG tablet Take 1 tablet (200 mg total) by mouth daily. Take a whole tablet every  other day and half tablet the other days 90 tablet 3  . ascorbic acid (VITAMIN C) 1000 MG tablet Take 1,000 mg by mouth daily.    . chlorthalidone (HYGROTON) 25 MG tablet TAKE 1/2 TABLET (12.5 MG TOTAL) BY MOUTH 2 (TWO) TIMES A WEEK. 13 tablet 62  . Cholecalciferol (VITAMIN D-3 PO) Take 1 capsule by mouth daily.     . finasteride (PROSCAR) 5 MG tablet Take 5 mg by mouth daily.    . hydrocortisone 2.5 % cream Apply 1 application topically as needed.     . Levothyroxine Sodium 75 MCG CAPS Take 75 mcg by mouth daily.   2  . linaclotide (LINZESS) 145 MCG CAPS capsule Take 1 capsule (145 mcg total) by mouth as needed. 30 capsule 3  . loratadine (CLARITIN) 10 MG tablet Take 10 mg by mouth daily.    Marland Kitchen losartan (COZAAR) 25 MG tablet Take 1 tablet (25 mg total) by mouth daily. 90 tablet 1  . metoprolol tartrate (LOPRESSOR) 25 MG tablet as needed.     . Multiple Vitamins-Minerals (PRESERVISION AREDS) CAPS Take 1 capsule by mouth 2 (two) times daily. VITAMIN A FREE    . Polyethyl Glycol-Propyl Glycol 0.4-0.3 % SOLN Apply 2 drops to eye daily as needed (for dry eeys).    . Rivaroxaban (XARELTO) 15 MG TABS tablet Take 1 tablet (15 mg total) by mouth daily with supper. 90 tablet 1  . rosuvastatin (CRESTOR) 5 MG tablet TAKE 1 TABLET BY MOUTH DAILY AT 6 PM. 90 tablet 3  . tobramycin-dexamethasone (TOBRADEX) ophthalmic solution Place 1-2 drops into the right eye 4 (four) times daily for 10 days. 5 mL 1  . vitamin B-12 (CYANOCOBALAMIN) 1000 MCG tablet Take 1,000  mcg by mouth daily.    . Hypromellose 0.2 % SOLN Apply to eye.    . NON FORMULARY Take by mouth.    . NON FORMULARY Take by mouth.     No current facility-administered medications for this visit.    Social History   Socioeconomic History  . Marital status: Married    Spouse name: Not on file  . Number of children: Not on file  . Years of education: Not on file  . Highest education level: Not on file  Occupational History  . Not on file   Tobacco Use  . Smoking status: Former Research scientist (life sciences)  . Smokeless tobacco: Never Used  Vaping Use  . Vaping Use: Never used  Substance and Sexual Activity  . Alcohol use: No  . Drug use: No  . Sexual activity: Not on file  Other Topics Concern  . Not on file  Social History Narrative  . Not on file   Social Determinants of Health   Financial Resource Strain: Not on file  Food Insecurity: Not on file  Transportation Needs: Not on file  Physical Activity: Not on file  Stress: Not on file  Social Connections: Not on file  Intimate Partner Violence: Not on file    Social history is notable in that he is married. 2 children 4 grandchildren. No tobacco alcohol use.  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   PE BP (!) 147/65 (BP Location: Left Arm, Patient Position: Sitting)   Pulse 71   Ht $R'5\' 8"'Ul$  (1.727 m)   Wt 160 lb (72.6 kg)   SpO2 98%   BMI 24.33 kg/m     Repeat blood pressure by me was 134/70  Wt Readings from Last 3 Encounters:  09/04/20 160 lb (72.6 kg)  03/02/20 163 lb (73.9 kg)  08/20/19 162 lb 3.2 oz (73.6 kg)   General: Alert, oriented, no distress.  Skin: normal turgor, no rashes, warm and dry HEENT: Normocephalic, atraumatic. Pupils equal round and reactive to light; sclera anicteric; extraocular muscles intact;  Nose without nasal septal hypertrophy Mouth/Parynx benign; Mallinpatti scale 3 Neck: No JVD, no carotid bruits; normal carotid upstroke Lungs: clear to ausculatation and percussion; no wheezing or rales Chest wall: without tenderness to palpitation Heart: PMI not displaced, RRR, s1 s2 normal, 1/6 systolic murmur, no diastolic murmur, no rubs, gallops, thrills, or heaves Abdomen: soft, nontender; no hepatosplenomehaly, BS+; abdominal aorta nontender and not dilated by palpation. Back: no CVA tenderness Pulses 2+ Musculoskeletal: full range of motion, normal strength, no joint deformities Extremities: no clubbing cyanosis or edema, Homan's sign negative  Neurologic: grossly nonfocal; Cranial nerves grossly wnl Psychologic: Normal mood and affect  ECG (independently read by me): NSR at 71, LAD, LVH with repolariation  March 2021 ECG (independently read by me): Sinus bradycardia 57 bpm with PAC, nonspecific ST abnormality.  PR interval 180 ms, QTc interval 449 ms.  November 2020 ECG (independently read by me): Sinus bradycardia 58 bpm, left axis deviation, small nondiagnostic lateral Q waves  November 2019 ECG (independently read by me): Normal sinus rhythm at 61 bpm.  Nonspecific ST-T changes.  No ectopy.  October 2018 ECG (independently read by me): Normal sinus rhythm at 71 bpm, occasional PACs every sixth beat, nonspecific ST changes.  QTc interval 473 ms.  September 2018 (independently read by me): Normal sinus rhythm at 64 bpm.  QTc interval 455 ms.  Possible left atrial enlargement.  Nonspecific ST-T  changes.  May 2018 ECG (independently read by me): Sinus bradycardia 59 bpm.  Possible left atrial enlargement.  No ST segment changes.  QTc interval 463 ms.  April 2018 ECG (independently read by me): Normal sinus rhythm at 69 bpm.  PACs, nonspecific ST changes.  Normal intervals.  08/20/2016 ECG (independently read by me): Atrial fibrillation with ventricular rate at 96 bpm.  Nonspecific ST-T changes.  Lateral T-wave changes.  08/13/2016 ECG (independently read by me): Sinus bradycardia 58 bpm.  Nonspecific ST changes.  Normal intervals.  September 2017 ECG (independently read by me): Sinus rhythm at 68.  Normal intervals.  No significant ST-T changes.  December 2016 ECG (independently read by me): Normal sinus rhythm at 61 bpm.  No ectopy.  QTc interval 434 ms.  June 2016 ECG (independently read by me): Sinus bradycardia 52 bpm.  QTc interval 407 ms.  PR interval 158 msec  December 2015 ECG (independently read by me): Sinus bradycardia at 49 bpm.  Nonspecific ST changes.  QTc interval 424 ms.  ECG (independently read by me): Sinus bradycardia 50 beats per minute.  Nonspecific ST changes.    Prior ECG: Sinus bradycardia at 50 beats per minute. No ectopy on ECG. Intervals normal  LABS: BMP Latest Ref Rng & Units 03/13/2020 08/19/2019 07/27/2019  Glucose 65 - 99 mg/dL 93 110(H) 94  BUN 8 - 27 mg/dL 35(H) 30(H) 35(H)  Creatinine 0.76 - 1.27 mg/dL 1.63(H) 1.75(H) 1.80(H)  BUN/Creat Ratio 10 - $Re'24 21 17 19  'OfO$ Sodium 134 - 144 mmol/L 141 137 131(L)  Potassium 3.5 - 5.2 mmol/L 4.4 4.5 5.3(H)  Chloride 96 - 106 mmol/L 101 98 93(L)  CO2 20 - 29 mmol/L $RemoveB'25 23 24  'XLkbhMcK$ Calcium 8.6 - 10.2 mg/dL 9.5 9.1 9.0   Hepatic Function Latest Ref Rng & Units 04/22/2019 12/28/2014 01/05/2014  Total Protein 6.0 - 8.5 g/dL 6.8 6.2 6.7  Albumin 3.7 - 4.7 g/dL 4.2 3.7 4.4  AST 0 - 40 IU/L $Remov'13 17 15  'bogqlj$ ALT 0 - 44 IU/L $Remov'14 15 15  'hkIIHw$ Alk Phosphatase 39 - 117 IU/L 143(H) 117(H) 107  Total Bilirubin 0.0 - 1.2 mg/dL 0.3 0.6 0.4    CBC Latest Ref Rng & Units 08/15/2016 12/27/2015 12/28/2014  WBC 4.0 - 10.5 K/uL 5.6 5.0 4.7  Hemoglobin 13.0 - 17.0 g/dL 11.9(L) 13.2 13.3  Hematocrit 39.0 - 52.0 % 34.9(L) 38.6 39.5  Platelets 150 - 400 K/uL 156 158 173   Lab Results  Component Value Date   MCV 100.6 (H) 08/15/2016   MCV 100.5 (H) 12/27/2015   MCV 100.0 12/28/2014   Lab Results  Component Value Date   TSH 3.38 08/20/2016   Lipid Panel     Component  Value Date/Time   CHOL 120 (L) 12/28/2014 0924   TRIG 85 12/28/2014 0924   HDL 49 12/28/2014 0924   CHOLHDL 2.4 12/28/2014 0924   VLDL 17 12/28/2014 0924   LDLCALC 54 12/28/2014 0924     RADIOLOGY: No results found.  IMPRESSION:  1. Essential hypertension   2. PAF (paroxysmal atrial fibrillation) ([place])   3. Chronic anticoagulation   4. Hyperlipidemia LDL goal <70   5. Stage 3b chronic kidney disease ([place])   6. Hypothyroidism, unspecified type     ASSESSMENT AND PLAN: Ms. Ardis is an 6 -year-old gentleman who has a history of hypertension, hyperlipidemia, hypothyroidism, mitral valve prolapse, as well as paroxysmal atrial fibrillation. He had been maintaining sinus rhythm and was without recurrent atrial fibrillation until July 2017 when he developed recurrent atrial fibrillation with rapid ventricular response and was evaluated on 2 consecutive evenings in the emergency room where he was treated with IV metoprolol for rate slowing. He developed recurrent atrial fibrillation in April 2018 and ultimately pharmacologically cardioverted back to sinus rhythm.  On subsequent evaluation, he was having some PACs and I initiated low-dose beta-blocker therapy.  Over the past several years he has been maintaining sinus rhythm and has been without recurrent atrial fibrillation.  At his last evaluation, I recommended reduction of amiodarone down to 200 mg alternating with 100 mg every other day.  His bradycardia has improved.  He is not aware of any palpitations or  recurrent dysrhythmic events.  His blood pressure today is stable on his regimen now consisting of chlorthalidone which he takes 12.5 mg on Mondays and Thursdays, losartan 25 mg daily, and he only takes metoprolol as needed.  He has stage IIIb CKD.  Creatinine was 1.63 on March 13, 2020 which had improved from 1.75 previously.  Previous edema has improved.  He continues to be on anticoagulation with Xarelto at the reduced dose of 15 mg and denies bleeding.  He is on rosuvastatin 20 mg for hyperlipidemia.  LDL cholesterol in May 2021 was 61.  He continues to be on levothyroxine at 75 mcg for hypothyroidism.  He sees Dr. [name] for his primary care who checks laboratory.  I will see him in 6 months for reevaluation or sooner as needed.  Troy Sine, MD, [facility]  09/07/2020 5:18 PM

## 2020-09-07 ENCOUNTER — Encounter: Payer: Self-pay | Admitting: Cardiovascular Disease

## 2020-09-07 DIAGNOSIS — H3530 Unspecified macular degeneration: Secondary | ICD-10-CM | POA: Diagnosis not present

## 2020-09-07 DIAGNOSIS — R269 Unspecified abnormalities of gait and mobility: Secondary | ICD-10-CM | POA: Diagnosis not present

## 2020-09-11 ENCOUNTER — Encounter (INDEPENDENT_AMBULATORY_CARE_PROVIDER_SITE_OTHER): Payer: Medicare Other | Admitting: Ophthalmology

## 2020-09-12 ENCOUNTER — Ambulatory Visit (INDEPENDENT_AMBULATORY_CARE_PROVIDER_SITE_OTHER): Payer: Medicare Other | Admitting: Ophthalmology

## 2020-09-12 ENCOUNTER — Encounter (INDEPENDENT_AMBULATORY_CARE_PROVIDER_SITE_OTHER): Payer: Self-pay | Admitting: Ophthalmology

## 2020-09-12 ENCOUNTER — Other Ambulatory Visit: Payer: Self-pay

## 2020-09-12 DIAGNOSIS — H33001 Unspecified retinal detachment with retinal break, right eye: Secondary | ICD-10-CM

## 2020-09-12 MED ORDER — LOTEPREDNOL ETABONATE 0.5 % OP SUSP: 1.0000 [drp] | Freq: Four times a day (QID) | OPHTHALMIC | 1 refills | Status: DC

## 2020-09-12 NOTE — Progress Notes (Signed)
09/12/2020     CHIEF COMPLAINT Patient presents for Post-op Follow-up ( 2 wk PO sx on 08/16/2020//Pt states that he is "doing ok."/Denies pain/Pt reports still using abx and steroid gtts QID OD but he is unsure of which gtts and he did not bring)   HISTORY OF PRESENT ILLNESS: Tanner Ho is a 80 y.o. male who presents to the clinic today for:   HPI    Post-op Follow-up    In right eye.  Discomfort includes none.  Negative for pain.  Vision is stable. Additional comments:  2 wk PO sx on 08/16/2020  Pt states that he is "doing ok." Denies pain Pt reports still using abx and steroid gtts QID OD but he is unsure of which gtts and he did not bring       Last edited by Kendra Opitz, COA on 09/12/2020  8:16 AM. (History)      Referring physician: [name], MD [address],  [place] [id number]  HISTORICAL INFORMATION:   Selected notes from the MEDICAL RECORD NUMBER       CURRENT MEDICATIONS: Current Outpatient Medications (Ophthalmic Drugs)  Medication Sig  . loteprednol (LOTEMAX) 0.5 % ophthalmic suspension Place 1 drop into the right eye 4 (four) times daily.  . Hypromellose 0.2 % SOLN Apply to eye.  Vladimir Faster Glycol-Propyl Glycol 0.4-0.3 % SOLN Apply 2 drops to eye daily as needed (for dry eeys).   No current facility-administered medications for this visit. (Ophthalmic Drugs)   Current Outpatient Medications (Other)  Medication Sig  . acetaminophen (TYLENOL) 650 MG CR tablet Take 650 mg by mouth every 8 (eight) hours as needed for pain.  Marland Kitchen alfuzosin (UROXATRAL) 10 MG 24 hr tablet Take 10 mg by mouth daily.  Marland Kitchen amiodarone (PACERONE) 200 MG tablet Take 1 tablet (200 mg total) by mouth daily. Take a whole tablet every other day and half tablet the other days  . ascorbic acid (VITAMIN C) 1000 MG tablet Take 1,000 mg by mouth daily.  . chlorthalidone (HYGROTON) 25 MG tablet TAKE 1/2 TABLET (12.5 MG TOTAL) BY MOUTH 2 (TWO) TIMES A WEEK.  .  Cholecalciferol (VITAMIN D-3 PO) Take 1 capsule by mouth daily.   . finasteride (PROSCAR) 5 MG tablet Take 5 mg by mouth daily.  . hydrocortisone 2.5 % cream Apply 1 application topically as needed.   . Levothyroxine Sodium 75 MCG CAPS Take 75 mcg by mouth daily.   Marland Kitchen linaclotide (LINZESS) 145 MCG CAPS capsule Take 1 capsule (145 mcg total) by mouth as needed.  . loratadine (CLARITIN) 10 MG tablet Take 10 mg by mouth daily.  Marland Kitchen losartan (COZAAR) 25 MG tablet Take 1 tablet (25 mg total) by mouth daily.  . metoprolol tartrate (LOPRESSOR) 25 MG tablet as needed.   . Multiple Vitamins-Minerals (PRESERVISION AREDS) CAPS Take 1 capsule by mouth 2 (two) times daily. VITAMIN A FREE  . NON FORMULARY Take by mouth.  . NON FORMULARY Take by mouth.  . Rivaroxaban (XARELTO) 15 MG TABS tablet Take 1 tablet (15 mg total) by mouth daily with supper.  . rosuvastatin (CRESTOR) 5 MG tablet TAKE 1 TABLET BY MOUTH DAILY AT 6 PM.  . vitamin B-12 (CYANOCOBALAMIN) 1000 MCG tablet Take 1,000 mcg by mouth daily.   No current facility-administered medications for this visit. (Other)      REVIEW OF SYSTEMS:    ALLERGIES Allergies  Allergen Reactions  . Ciprofloxacin Other (See Comments)    Cannot  take due to currently taking Amiodarone  . Hytrin [Terazosin] Other (See Comments)    Syncopal event     PAST MEDICAL HISTORY Past Medical History:  Diagnosis Date  . Adrenal mass ([place])   . Asthma   . BPH (benign prostatic hyperplasia)   . CKD (chronic kidney disease), stage III ([place])   . GERD (gastroesophageal reflux disease)   . H/O cardiovascular stress test 03/2012   EKG with 1-2 mm ST segment depression but normal perfusion images. Followup metastases test reduced exercise effort and functional status and determinate for myocardial dysfunction  . History of cardiac monitoring    a. 2015: cardiac monitor revealed episodes of PACs, bradycardia, with heart rates down to 48, 4 beat PSVT, 6 beat run of NSVT at  a rate of 131. There was no recurrent afib.   Marland Kitchen Hyperlipidemia LDL goal < 100   . Hypertension   . Hypothyroidism   . Macular degeneration   . MVP (mitral valve prolapse)    on cardiac cath - not mentioned on 2014 echo.  Marland Kitchen NSVT (nonsustained ventricular tachycardia) ([place])   . PAF (paroxysmal atrial fibrillation) ([place])    last episode 03/2012  . Pericarditis   . Premature atrial contractions   . Sinus bradycardia    Past Surgical History:  Procedure Laterality Date  . ABDOMINAL VASCULAR ULTRASOUND EVALUATION  12/20/2008   mild non-obstructive plaque, abdominal aorta, with mild calcification at the bifurcation-no obstruction, no evidence for aneurysm of the abdominal aorta  . CARDIAC CATHETERIZATION  01/04/1999   hyperdynamic LV function, probable angiographic mitral valve prolapse, systolic muscle bridging of the mid LAD without obstructive disease, systolic narrowing up to 62%  . Cardiopulmonary MET-test  07/20/2012   reduced exercise effort and functional status, peak max O2 comsumption was only 58% of predicted, cardiovascular status was interpreted as indeterminate for myocardial dysfunction due to the suboptimal peak cardiovascular stress load.    FAMILY HISTORY Family History  Problem Relation Age of Onset  . Stroke Mother   . Stroke Paternal Grandfather     SOCIAL HISTORY Social History   Tobacco Use  . Smoking status: Former Research scientist (life sciences)  . Smokeless tobacco: Never Used  Vaping Use  . Vaping Use: Never used  Substance Use Topics  . Alcohol use: No  . Drug use: No         OPHTHALMIC EXAM:  Base Eye Exam    Visual Acuity (ETDRS)      Right Left   Dist [place] HM 20/200   Dist ph [place]  NI       Tonometry (Tonopen, 8:15 AM)      Right Left   Pressure 14 14       Pupils      Dark Light Shape React APD   Right 5 4 Round Sluggish +1   Left 5 4 Round Slow None       Neuro/Psych    Oriented x3: Yes   Mood/Affect: Normal       Dilation    Right eye: 1.0%  Mydriacyl, 2.5% Phenylephrine @ 8:16 AM        Slit Lamp and Fundus Exam    External Exam      Right Left   External Normal        Slit Lamp Exam      Right Left   Lids/Lashes Normal Normal   Conjunctiva/Sclera White and quiet Subconjunctival hemorrhage, old minor and clearing   Cornea Clear Clear   Anterior  Chamber Deep and quiet Deep and quiet   Iris Round and reactive Round and reactive   Lens Posterior chamber intraocular lens Posterior chamber intraocular lens   Anterior Vitreous Clear vitrectomized Normal       Fundus Exam      Right Left   Posterior Vitreous Clear, vitrectomized, 50% gas    Disc Normal    C/D Ratio 0.0    Macula Advanced age related macular degeneration, Geographic atrophy 4 disc areas in size confluent, no hemorrhage, no macular thickening    Vessels Normal    Periphery Retina attached, good laser retinopexy, temporal and inferorly           IMAGING AND PROCEDURES  Imaging and Procedures for 09/12/20           ASSESSMENT/PLAN:  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  Patient instructed not to sleep or lay on his back flat can sleep on either side however.  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  We will discontinue the use of topical TobraDex to the right eye.  No need for antibiotics required at this stage  2.  Commence therapy with Lotemax or generic form thereof, 1 drop right eye 4 times daily with only 1 refill  3.  Follow-up here in 4 weeks  Ophthalmic Meds Ordered this visit:  Meds ordered this encounter  Medications  . loteprednol (LOTEMAX) 0.5 % ophthalmic suspension    Sig: Place 1 drop into the right eye 4 (four) times daily.    Dispense:  5 mL    Refill:  1       Return in about 4 weeks (around  10/10/2020) for dilate, OD, COLOR FP.  There are no Patient Instructions on file for this visit.   Explained the diagnoses, plan, and follow up with the patient and they expressed understanding.  Patient expressed understanding of the importance of proper follow up care.   Clent Demark Caralyn Twining M.D. Diseases & Surgery of the Retina and Vitreous Retina [facility] Diabetic [place] 09/12/20     Abbreviations: M myopia (nearsighted); A astigmatism; H hyperopia (farsighted); P presbyopia; Mrx spectacle prescription;  CTL contact lenses; OD right eye; OS left eye; OU both eyes  XT exotropia; ET esotropia; PEK punctate epithelial keratitis; PEE punctate epithelial erosions; DES dry eye syndrome; MGD meibomian gland dysfunction; ATs artificial tears; PFAT's preservative free artificial tears; [place] nuclear sclerotic cataract; PSC posterior subcapsular cataract; ERM epi-retinal membrane; PVD posterior vitreous detachment; RD retinal detachment; DM diabetes mellitus; DR diabetic retinopathy; NPDR non-proliferative diabetic retinopathy; PDR proliferative diabetic retinopathy; CSME clinically significant macular edema; DME diabetic macular edema; dbh dot blot hemorrhages; CWS cotton wool spot; POAG primary open angle glaucoma; C/D cup-to-disc ratio; HVF humphrey visual field; GVF goldmann visual field; OCT optical coherence tomography; IOP intraocular pressure; BRVO Branch retinal vein occlusion; CRVO central retinal vein occlusion; CRAO central retinal artery occlusion; BRAO branch retinal artery occlusion; RT retinal tear; SB scleral buckle; PPV pars plana vitrectomy; VH Vitreous hemorrhage; PRP panretinal laser photocoagulation; IVK intravitreal kenalog; VMT vitreomacular traction; MH Macular hole;  NVD neovascularization of the disc; NVE neovascularization elsewhere; AREDS age related eye disease study; ARMD age related macular degeneration; POAG primary open angle glaucoma; EBMD epithelial/anterior basement membrane  dystrophy; ACIOL anterior chamber intraocular  lens; IOL intraocular lens; PCIOL posterior chamber intraocular lens; Phaco/IOL phacoemulsification with intraocular lens placement; PRK photorefractive keratectomy; LASIK laser assisted in situ keratomileusis; HTN hypertension; DM diabetes mellitus; COPD chronic obstructive pulmonary disease 

## 2020-09-12 NOTE — Assessment & Plan Note (Signed)
OD, retina reattached, with 50% gas.  Patient instructed not to sleep or lay on his back flat can sleep on either side however.  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

## 2020-09-14 ENCOUNTER — Other Ambulatory Visit: Payer: Self-pay

## 2020-09-14 ENCOUNTER — Ambulatory Visit (INDEPENDENT_AMBULATORY_CARE_PROVIDER_SITE_OTHER): Payer: Medicare Other | Admitting: Ophthalmology

## 2020-09-14 ENCOUNTER — Encounter (INDEPENDENT_AMBULATORY_CARE_PROVIDER_SITE_OTHER): Payer: Self-pay | Admitting: Ophthalmology

## 2020-09-14 DIAGNOSIS — H33001 Unspecified retinal detachment with retinal break, right eye: Secondary | ICD-10-CM

## 2020-09-14 NOTE — Assessment & Plan Note (Signed)
Follow-up OD as scheduled.  Conjunctival injection OD is typical post surgical appearance OD will observe

## 2020-09-14 NOTE — Progress Notes (Signed)
09/14/2020     CHIEF COMPLAINT Patient presents for Eye Problem (Per Pt wife, Pt woke up with Red OD and they are concerned d/t the fact that the eye just had surgery 3 weeks ago. Pt states, " I was changed to that steroid gtt a couple of days ago and I need to make sure that I am not allergic to that gtt."/Pt denies any pain, FOL. Pt states that there may be a new floater./Pt reports taking Lotemax QID OD)   HISTORY OF PRESENT ILLNESS: Tanner Ho is a 80 y.o. male who presents to the clinic today for:   HPI    Eye Problem     Additional comments: Per Pt wife, Pt woke up with Red OD and they are concerned d/t the fact that the eye just had surgery 3 weeks ago. Pt states, " I was changed to that steroid gtt a couple of days ago and I need to make sure that I am not allergic to that gtt." Pt denies any pain, FOL. Pt states that there may be a new floater. Pt reports taking Lotemax QID OD       Last edited by Kendra Opitz, COA on 09/14/2020  1:44 PM. (History)      Referring physician: [name], MD [address] [place],  [place] [address]  HISTORICAL INFORMATION:   Selected notes from the MEDICAL RECORD NUMBER       CURRENT MEDICATIONS: Current Outpatient Medications (Ophthalmic Drugs)  Medication Sig  . loteprednol (LOTEMAX) 0.5 % ophthalmic suspension Place 1 drop into the right eye 4 (four) times daily.  . Hypromellose 0.2 % SOLN Apply to eye.  Vladimir Faster Glycol-Propyl Glycol 0.4-0.3 % SOLN Apply 2 drops to eye daily as needed (for dry eeys).   No current facility-administered medications for this visit. (Ophthalmic Drugs)   Current Outpatient Medications (Other)  Medication Sig  . acetaminophen (TYLENOL) 650 MG CR tablet Take 650 mg by mouth every 8 (eight) hours as needed for pain.  Marland Kitchen alfuzosin (UROXATRAL) 10 MG 24 hr tablet Take 10 mg by mouth daily.  Marland Kitchen amiodarone (PACERONE) 200 MG tablet Take 1 tablet (200 mg total) by mouth daily. Take a  whole tablet every other day and half tablet the other days  . ascorbic acid (VITAMIN C) 1000 MG tablet Take 1,000 mg by mouth daily.  . chlorthalidone (HYGROTON) 25 MG tablet TAKE 1/2 TABLET (12.5 MG TOTAL) BY MOUTH 2 (TWO) TIMES A WEEK.  . Cholecalciferol (VITAMIN D-3 PO) Take 1 capsule by mouth daily.   . finasteride (PROSCAR) 5 MG tablet Take 5 mg by mouth daily.  . hydrocortisone 2.5 % cream Apply 1 application topically as needed.   . Levothyroxine Sodium 75 MCG CAPS Take 75 mcg by mouth daily.   Marland Kitchen linaclotide (LINZESS) 145 MCG CAPS capsule Take 1 capsule (145 mcg total) by mouth as needed.  . loratadine (CLARITIN) 10 MG tablet Take 10 mg by mouth daily.  Marland Kitchen losartan (COZAAR) 25 MG tablet Take 1 tablet (25 mg total) by mouth daily.  . metoprolol tartrate (LOPRESSOR) 25 MG tablet as needed.   . Multiple Vitamins-Minerals (PRESERVISION AREDS) CAPS Take 1 capsule by mouth 2 (two) times daily. VITAMIN A FREE  . NON FORMULARY Take by mouth.  . NON FORMULARY Take by mouth.  . Rivaroxaban (XARELTO) 15 MG TABS tablet Take 1 tablet (15 mg total) by mouth daily with supper.  . rosuvastatin (CRESTOR) 5 MG tablet TAKE  1 TABLET BY MOUTH DAILY AT 6 PM.  . vitamin B-12 (CYANOCOBALAMIN) 1000 MCG tablet Take 1,000 mcg by mouth daily.   No current facility-administered medications for this visit. (Other)      REVIEW OF SYSTEMS:    ALLERGIES Allergies  Allergen Reactions  . Ciprofloxacin Other (See Comments)    Cannot take due to currently taking Amiodarone  . Hytrin [Terazosin] Other (See Comments)    Syncopal event     PAST MEDICAL HISTORY Past Medical History:  Diagnosis Date  . Adrenal mass ([place])   . Asthma   . BPH (benign prostatic hyperplasia)   . CKD (chronic kidney disease), stage III ([place])   . GERD (gastroesophageal reflux disease)   . H/O cardiovascular stress test 03/2012   EKG with 1-2 mm ST segment depression but normal perfusion images. Followup metastases test reduced  exercise effort and functional status and determinate for myocardial dysfunction  . History of cardiac monitoring    a. 2015: cardiac monitor revealed episodes of PACs, bradycardia, with heart rates down to 48, 4 beat PSVT, 6 beat run of NSVT at a rate of 131. There was no recurrent afib.   Marland Kitchen Hyperlipidemia LDL goal < 100   . Hypertension   . Hypothyroidism   . Macular degeneration   . MVP (mitral valve prolapse)    on cardiac cath - not mentioned on 2014 echo.  Marland Kitchen NSVT (nonsustained ventricular tachycardia) ([facility])   . PAF (paroxysmal atrial fibrillation) ([place])    last episode 03/2012  . Pericarditis   . Premature atrial contractions   . Sinus bradycardia    Past Surgical History:  Procedure Laterality Date  . ABDOMINAL VASCULAR ULTRASOUND EVALUATION  12/20/2008   mild non-obstructive plaque, abdominal aorta, with mild calcification at the bifurcation-no obstruction, no evidence for aneurysm of the abdominal aorta  . CARDIAC CATHETERIZATION  01/04/1999   hyperdynamic LV function, probable angiographic mitral valve prolapse, systolic muscle bridging of the mid LAD without obstructive disease, systolic narrowing up to 43%  . Cardiopulmonary MET-test  07/20/2012   reduced exercise effort and functional status, peak max O2 comsumption was only 58% of predicted, cardiovascular status was interpreted as indeterminate for myocardial dysfunction due to the suboptimal peak cardiovascular stress load.    FAMILY HISTORY Family History  Problem Relation Age of Onset  . Stroke Mother   . Stroke Paternal Grandfather     SOCIAL HISTORY Social History   Tobacco Use  . Smoking status: Former Research scientist (life sciences)  . Smokeless tobacco: Never Used  Vaping Use  . Vaping Use: Never used  Substance Use Topics  . Alcohol use: No  . Drug use: No         OPHTHALMIC EXAM: Base Eye Exam    Visual Acuity (ETDRS)      Right Left   Dist [place] 20/400 20/200       Tonometry (Tonopen, 1:47 PM)      Right Left    Pressure 15 17       Pupils      Dark Light Shape React APD   Right 5 4 Round Sluggish +1   Left 4 3 Round Brisk None       Neuro/Psych    Oriented x3: Yes   Mood/Affect: Normal       Dilation    Right eye: 1.0% Mydriacyl, 2.5% Phenylephrine @ 1:47 PM        Slit Lamp and Fundus Exam    External Exam  Right Left   External Normal        Slit Lamp Exam      Right Left   Lids/Lashes Normal Normal   Conjunctiva/Sclera 1+ Injection, typical post surgical appearance Subconjunctival hemorrhage, old minor and clearing   Cornea Clear Clear   Anterior Chamber Deep and quiet Deep and quiet   Iris Round and reactive Round and reactive   Lens Posterior chamber intraocular lens Posterior chamber intraocular lens   Anterior Vitreous Clear vitrectomized, 40% Normal       Fundus Exam      Right Left   Posterior Vitreous Clear, vitrectomized, 40% gas    Disc Normal    C/D Ratio 0.0    Macula Advanced age related macular degeneration, Geographic atrophy 4 disc areas in size confluent, no hemorrhage, no macular thickening    Vessels Normal    Periphery Retina attached, good laser retinopexy, temporal and inferorly           IMAGING AND PROCEDURES  Imaging and Procedures for 09/14/20           ASSESSMENT/PLAN:  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  Conjunctival injection OD is typical post surgical appearance OD will observe      ICD-10-CM   1. 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  OD retinal detachment approved improving visual acuity, typical post surgical repair appearance of the conjunctiva no concerns 2.  3.  Continue on topical eye medications as previously prescribed  Ophthalmic Meds Ordered this visit:  No orders of the defined types were placed in this encounter.      Return for As scheduled, COLOR FP.  There are no Patient Instructions on file for this  visit.   Explained the diagnoses, plan, and follow up with the patient and they expressed understanding.  Patient expressed understanding of the importance of proper follow up care.   Clent Demark Lonnette Shrode M.D. Diseases & Surgery of the Retina and Vitreous Retina & Diabetic [place] 09/14/20     Abbreviations: M myopia (nearsighted); A astigmatism; H hyperopia (farsighted); P presbyopia; Mrx spectacle prescription;  CTL contact lenses; OD right eye; OS left eye; OU both eyes  XT exotropia; ET esotropia; PEK punctate epithelial keratitis; PEE punctate epithelial erosions; DES dry eye syndrome; MGD meibomian gland dysfunction; ATs artificial tears; PFAT's preservative free artificial tears; [place] nuclear sclerotic cataract; PSC posterior subcapsular cataract; ERM epi-retinal membrane; PVD posterior vitreous detachment; RD retinal detachment; DM diabetes mellitus; DR diabetic retinopathy; NPDR non-proliferative diabetic retinopathy; PDR proliferative diabetic retinopathy; CSME clinically significant macular edema; DME diabetic macular edema; dbh dot blot hemorrhages; CWS cotton wool spot; POAG primary open angle glaucoma; C/D cup-to-disc ratio; HVF humphrey visual field; GVF goldmann visual field; OCT optical coherence tomography; IOP intraocular pressure; BRVO Branch retinal vein occlusion; CRVO central retinal vein occlusion; CRAO central retinal artery occlusion; BRAO branch retinal artery occlusion; RT retinal tear; SB scleral buckle; PPV pars plana vitrectomy; VH Vitreous hemorrhage; PRP panretinal laser photocoagulation; IVK intravitreal kenalog; VMT vitreomacular traction; MH Macular hole;  NVD neovascularization of the disc; NVE neovascularization elsewhere; AREDS age related eye disease study; ARMD age related macular degeneration; POAG primary open angle glaucoma; EBMD epithelial/anterior basement membrane dystrophy; ACIOL anterior chamber intraocular lens; IOL intraocular lens; PCIOL posterior chamber  intraocular lens; Phaco/IOL phacoemulsification with intraocular lens placement; [place] photorefractive keratectomy; LASIK laser assisted in situ keratomileusis; HTN hypertension; DM diabetes mellitus; COPD chronic obstructive  pulmonary disease

## 2020-09-19 ENCOUNTER — Other Ambulatory Visit: Payer: Self-pay | Admitting: Cardiovascular Disease

## 2020-10-05 ENCOUNTER — Encounter (INDEPENDENT_AMBULATORY_CARE_PROVIDER_SITE_OTHER): Payer: Self-pay | Admitting: Ophthalmology

## 2020-10-05 ENCOUNTER — Ambulatory Visit (INDEPENDENT_AMBULATORY_CARE_PROVIDER_SITE_OTHER): Payer: Medicare Other | Admitting: Ophthalmology

## 2020-10-05 ENCOUNTER — Other Ambulatory Visit: Payer: Self-pay

## 2020-10-05 DIAGNOSIS — H33001 Unspecified retinal detachment with retinal break, right eye: Secondary | ICD-10-CM

## 2020-10-05 MED ORDER — LOTEPREDNOL ETABONATE 0.5 % OP SUSP: 1.0000 [drp] | Freq: Every day | OPHTHALMIC | 1 refills | Status: DC

## 2020-10-05 NOTE — Progress Notes (Signed)
10/05/2020     CHIEF COMPLAINT Patient presents for Post-op Follow-up (4wk po OD/Pt states, "My va seems about the same. I still have my bubble but I think it may be getting a little smaller."/Pt states that he ran out of gtts last PM, but has been using them QID OD)   HISTORY OF PRESENT ILLNESS: Tanner Ho is a 80 y.o. male who presents to the clinic today for:   HPI    Post-op Follow-up    Laterality: right eye   Discomfort: none   Vision: is stable   Comments: 4wk po OD Pt states, "My va seems about the same. I still have my bubble but I think it may be getting a little smaller." Pt states that he ran out of gtts last PM, but has been using them QID OD       Last edited by Kendra Opitz, COA on 10/05/2020  9:06 AM. (History)      Referring physician: [name], MD [facility],  [place] [address]  HISTORICAL INFORMATION:   Selected notes from the [facility]: Current Outpatient Medications (Ophthalmic Drugs)  Medication Sig  . Hypromellose 0.2 % SOLN Apply to eye.  . loteprednol (LOTEMAX) 0.5 % ophthalmic suspension Place 1 drop into the right eye 4 (four) times daily.  Vladimir Faster Glycol-Propyl Glycol 0.4-0.3 % SOLN Apply 2 drops to eye daily as needed (for dry eeys).   No current facility-administered medications for this visit. (Ophthalmic Drugs)   Current Outpatient Medications (Other)  Medication Sig  . acetaminophen (TYLENOL) 650 MG CR tablet Take 650 mg by mouth every 8 (eight) hours as needed for pain.  Marland Kitchen alfuzosin (UROXATRAL) 10 MG 24 hr tablet Take 10 mg by mouth daily.  Marland Kitchen amiodarone (PACERONE) 200 MG tablet Take 1 tablet (200 mg total) by mouth daily. Take a whole tablet every other day and half tablet the other days  . ascorbic acid (VITAMIN C) 1000 MG tablet Take 1,000 mg by mouth daily.  . chlorthalidone (HYGROTON) 25 MG tablet TAKE 1/2 TABLET (12.5 MG TOTAL) BY MOUTH 2 (TWO) TIMES A  WEEK.  . Cholecalciferol (VITAMIN D-3 PO) Take 1 capsule by mouth daily.   . finasteride (PROSCAR) 5 MG tablet Take 5 mg by mouth daily.  . hydrocortisone 2.5 % cream Apply 1 application topically as needed.   . Levothyroxine Sodium 75 MCG CAPS Take 75 mcg by mouth daily.   Marland Kitchen linaclotide (LINZESS) 145 MCG CAPS capsule Take 1 capsule (145 mcg total) by mouth as needed.  . loratadine (CLARITIN) 10 MG tablet Take 10 mg by mouth daily.  Marland Kitchen losartan (COZAAR) 25 MG tablet TAKE 1 TABLET BY MOUTH EVERY DAY  . metoprolol tartrate (LOPRESSOR) 25 MG tablet as needed.   . Multiple Vitamins-Minerals (PRESERVISION AREDS) CAPS Take 1 capsule by mouth 2 (two) times daily. VITAMIN A FREE  . NON FORMULARY Take by mouth.  . NON FORMULARY Take by mouth.  . Rivaroxaban (XARELTO) 15 MG TABS tablet Take 1 tablet (15 mg total) by mouth daily with supper.  . rosuvastatin (CRESTOR) 5 MG tablet TAKE 1 TABLET BY MOUTH DAILY AT 6 PM.  . vitamin B-12 (CYANOCOBALAMIN) 1000 MCG tablet Take 1,000 mcg by mouth daily.   No current facility-administered medications for this visit. (Other)      REVIEW OF SYSTEMS:    ALLERGIES Allergies  Allergen Reactions  . Ciprofloxacin Other (  See Comments)    Cannot take due to currently taking Amiodarone  . Hytrin [Terazosin] Other (See Comments)    Syncopal event     PAST MEDICAL HISTORY Past Medical History:  Diagnosis Date  . Adrenal mass ([place])   . Asthma   . BPH (benign prostatic hyperplasia)   . CKD (chronic kidney disease), stage III ([place])   . GERD (gastroesophageal reflux disease)   . H/O cardiovascular stress test 03/2012   EKG with 1-2 mm ST segment depression but normal perfusion images. Followup metastases test reduced exercise effort and functional status and determinate for myocardial dysfunction  . History of cardiac monitoring    a. 2015: cardiac monitor revealed episodes of PACs, bradycardia, with heart rates down to 48, 4 beat PSVT, 6 beat run of NSVT at  a rate of 131. There was no recurrent afib.   Marland Kitchen Hyperlipidemia LDL goal < 100   . Hypertension   . Hypothyroidism   . Macular degeneration   . MVP (mitral valve prolapse)    on cardiac cath - not mentioned on 2014 echo.  Marland Kitchen NSVT (nonsustained ventricular tachycardia) ([place])   . PAF (paroxysmal atrial fibrillation) ([facility])    last episode 03/2012  . Pericarditis   . Premature atrial contractions   . Sinus bradycardia    Past Surgical History:  Procedure Laterality Date  . ABDOMINAL VASCULAR ULTRASOUND EVALUATION  12/20/2008   mild non-obstructive plaque, abdominal aorta, with mild calcification at the bifurcation-no obstruction, no evidence for aneurysm of the abdominal aorta  . CARDIAC CATHETERIZATION  01/04/1999   hyperdynamic LV function, probable angiographic mitral valve prolapse, systolic muscle bridging of the mid LAD without obstructive disease, systolic narrowing up to 84%  . Cardiopulmonary MET-test  07/20/2012   reduced exercise effort and functional status, peak max O2 comsumption was only 58% of predicted, cardiovascular status was interpreted as indeterminate for myocardial dysfunction due to the suboptimal peak cardiovascular stress load.    FAMILY HISTORY Family History  Problem Relation Age of Onset  . Stroke Mother   . Stroke Paternal Grandfather     SOCIAL HISTORY Social History   Tobacco Use  . Smoking status: Former Research scientist (life sciences)  . Smokeless tobacco: Never Used  Vaping Use  . Vaping Use: Never used  Substance Use Topics  . Alcohol use: No  . Drug use: No         OPHTHALMIC EXAM:  Base Eye Exam    Visual Acuity (Snellen - Linear)      Right Left   Dist [place] 20/400 20/200   Dist ph [place] NI NI       Tonometry (Tonopen, 9:07 AM)      Right Left   Pressure 12 14       Pupils      Pupils Dark Light Shape React APD   Right PERRL 5 4 Round Sluggish +1   Left PERRL 4 3 Round Brisk None       Neuro/Psych    Oriented x3: Yes   Mood/Affect: Normal        Dilation    Right eye: 1.0% Mydriacyl, 2.5% Phenylephrine @ 9:06 AM        Slit Lamp and Fundus Exam    External Exam      Right Left   External Normal        Slit Lamp Exam      Right Left   Lids/Lashes Normal Normal   Conjunctiva/Sclera 1+ Injection, typical post surgical appearance  Subconjunctival hemorrhage, old minor and clearing   Cornea Clear Clear   Anterior Chamber Deep and quiet Deep and quiet   Iris Round and reactive Round and reactive   Lens Posterior chamber intraocular lens Posterior chamber intraocular lens   Anterior Vitreous Clear vitrectomized, 20% Normal       Fundus Exam      Right Left   Posterior Vitreous Clear, vitrectomized, 20% gas    Disc Normal    C/D Ratio 0.0    Macula Advanced age related macular degeneration, Geographic atrophy 4 disc areas in size confluent, no hemorrhage, no macular thickening    Vessels Normal    Periphery Retina attached, good laser retinopexy, temporal and inferorly           IMAGING AND PROCEDURES  Imaging and Procedures for 10/05/20  OCT, Retina - OU - Both Eyes       Right Eye Quality was borderline. Progression has been stable. Findings include central retinal atrophy, outer retinal atrophy, inner retinal atrophy.   Left Eye Quality was borderline. Scan locations included subfoveal. Progression has been stable. Findings include central retinal atrophy, outer retinal atrophy, inner retinal atrophy.   Notes Bilateral diffuse geographic confluent RPE atrophy.  No signs of CNVM OU.       Color Fundus Photography Optos - OU - Both Eyes       Right Eye Progression has improved. Macula : geographic atrophy. Vessels : normal observations.   Left Eye Progression has been stable. Disc findings include normal observations. Macula : geographic atrophy. Vessels : normal observations. Periphery : normal observations.   Notes Good retinopexy temporally and inferiorly at the sites of subretinal fluid drainage  OD, a attached, good retinopexy.  20% gas bubble remains  Retina nicely attached.  OS clear media geographic atrophy centrally accounts for acuity                ASSESSMENT/PLAN:  No problem-specific Assessment & Plan notes found for this encounter.      ICD-10-CM   1. 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  OD, retina attached nicely.  No recurrences of PVR noted.    2.  Topical Lotemax to be decreased to once daily right eye until the next bottle runs out or 8 weeks whichever comes first  3.  Ophthalmic Meds Ordered this visit:  No orders of the defined types were placed in this encounter.      Return in about 8 weeks (around 11/30/2020) for dilate, OD, COLOR FP.  There are no Patient Instructions on file for this visit.   Explained the diagnoses, plan, and follow up with the patient and they expressed understanding.  Patient expressed understanding of the importance of proper follow up care.   Clent Demark Danford Tat M.D. Diseases & Surgery of the Retina and Vitreous Retina & Diabetic [place] 10/05/20     Abbreviations: M myopia (nearsighted); A astigmatism; H hyperopia (farsighted); P presbyopia; Mrx spectacle prescription;  CTL contact lenses; OD right eye; OS left eye; OU both eyes  XT exotropia; ET esotropia; PEK punctate epithelial keratitis; PEE punctate epithelial erosions; DES dry eye syndrome; MGD meibomian gland dysfunction; ATs artificial tears; PFAT's preservative free artificial tears; [place] nuclear sclerotic cataract; PSC posterior subcapsular cataract; ERM epi-retinal membrane; PVD posterior vitreous detachment; RD retinal detachment; DM diabetes mellitus; DR diabetic  retinopathy; NPDR non-proliferative diabetic retinopathy; PDR proliferative diabetic retinopathy; CSME clinically significant macular edema; DME diabetic macular edema; dbh dot  blot hemorrhages; CWS cotton wool spot; POAG primary open angle glaucoma; C/D cup-to-disc ratio; HVF humphrey visual field; GVF goldmann visual field; OCT optical coherence tomography; IOP intraocular pressure; BRVO Branch retinal vein occlusion; CRVO central retinal vein occlusion; CRAO central retinal artery occlusion; BRAO branch retinal artery occlusion; RT retinal tear; SB scleral buckle; PPV pars plana vitrectomy; VH Vitreous hemorrhage; PRP panretinal laser photocoagulation; IVK intravitreal kenalog; VMT vitreomacular traction; MH Macular hole;  NVD neovascularization of the disc; NVE neovascularization elsewhere; AREDS age related eye disease study; ARMD age related macular degeneration; POAG primary open angle glaucoma; EBMD epithelial/anterior basement membrane dystrophy; ACIOL anterior chamber intraocular lens; IOL intraocular lens; PCIOL posterior chamber intraocular lens; Phaco/IOL phacoemulsification with intraocular lens placement; [place] photorefractive keratectomy; LASIK laser assisted in situ keratomileusis; HTN hypertension; DM diabetes mellitus; COPD chronic obstructive pulmonary disease

## 2020-10-06 ENCOUNTER — Other Ambulatory Visit: Payer: Self-pay | Admitting: Cardiovascular Disease

## 2020-10-11 DIAGNOSIS — E039 Hypothyroidism, unspecified: Secondary | ICD-10-CM | POA: Diagnosis not present

## 2020-10-11 DIAGNOSIS — E785 Hyperlipidemia, unspecified: Secondary | ICD-10-CM | POA: Diagnosis not present

## 2020-10-11 DIAGNOSIS — I10 Essential (primary) hypertension: Secondary | ICD-10-CM | POA: Diagnosis not present

## 2020-10-12 DIAGNOSIS — I739 Peripheral vascular disease, unspecified: Secondary | ICD-10-CM | POA: Diagnosis not present

## 2020-10-12 DIAGNOSIS — L602 Onychogryphosis: Secondary | ICD-10-CM | POA: Diagnosis not present

## 2020-10-12 DIAGNOSIS — L84 Corns and callosities: Secondary | ICD-10-CM | POA: Diagnosis not present

## 2020-10-18 DIAGNOSIS — R269 Unspecified abnormalities of gait and mobility: Secondary | ICD-10-CM | POA: Diagnosis not present

## 2020-10-18 DIAGNOSIS — E538 Deficiency of other specified B group vitamins: Secondary | ICD-10-CM | POA: Diagnosis not present

## 2020-10-18 DIAGNOSIS — R2681 Unsteadiness on feet: Secondary | ICD-10-CM | POA: Diagnosis not present

## 2020-10-18 DIAGNOSIS — E039 Hypothyroidism, unspecified: Secondary | ICD-10-CM | POA: Diagnosis not present

## 2020-10-18 DIAGNOSIS — E785 Hyperlipidemia, unspecified: Secondary | ICD-10-CM | POA: Diagnosis not present

## 2020-10-18 DIAGNOSIS — Z0001 Encounter for general adult medical examination with abnormal findings: Secondary | ICD-10-CM | POA: Diagnosis not present

## 2020-10-18 DIAGNOSIS — I4891 Unspecified atrial fibrillation: Secondary | ICD-10-CM | POA: Diagnosis not present

## 2020-10-18 DIAGNOSIS — I10 Essential (primary) hypertension: Secondary | ICD-10-CM | POA: Diagnosis not present

## 2020-10-18 DIAGNOSIS — K5900 Constipation, unspecified: Secondary | ICD-10-CM | POA: Diagnosis not present

## 2020-10-18 DIAGNOSIS — N1832 Chronic kidney disease, stage 3b: Secondary | ICD-10-CM | POA: Diagnosis not present

## 2020-10-18 DIAGNOSIS — D6869 Other thrombophilia: Secondary | ICD-10-CM | POA: Diagnosis not present

## 2020-10-18 DIAGNOSIS — I480 Paroxysmal atrial fibrillation: Secondary | ICD-10-CM | POA: Diagnosis not present

## 2020-10-19 DIAGNOSIS — L218 Other seborrheic dermatitis: Secondary | ICD-10-CM | POA: Diagnosis not present

## 2020-10-19 DIAGNOSIS — Z85828 Personal history of other malignant neoplasm of skin: Secondary | ICD-10-CM | POA: Diagnosis not present

## 2020-10-19 DIAGNOSIS — B379 Candidiasis, unspecified: Secondary | ICD-10-CM | POA: Diagnosis not present

## 2020-10-19 DIAGNOSIS — L738 Other specified follicular disorders: Secondary | ICD-10-CM | POA: Diagnosis not present

## 2020-10-19 DIAGNOSIS — L905 Scar conditions and fibrosis of skin: Secondary | ICD-10-CM | POA: Diagnosis not present

## 2020-10-21 DIAGNOSIS — N400 Enlarged prostate without lower urinary tract symptoms: Secondary | ICD-10-CM | POA: Diagnosis not present

## 2020-10-21 DIAGNOSIS — I10 Essential (primary) hypertension: Secondary | ICD-10-CM | POA: Diagnosis not present

## 2020-10-21 DIAGNOSIS — H353130 Nonexudative age-related macular degeneration, bilateral, stage unspecified: Secondary | ICD-10-CM | POA: Diagnosis not present

## 2020-10-21 DIAGNOSIS — E039 Hypothyroidism, unspecified: Secondary | ICD-10-CM | POA: Diagnosis not present

## 2020-10-21 DIAGNOSIS — I4891 Unspecified atrial fibrillation: Secondary | ICD-10-CM | POA: Diagnosis not present

## 2020-10-21 DIAGNOSIS — Z85828 Personal history of other malignant neoplasm of skin: Secondary | ICD-10-CM | POA: Diagnosis not present

## 2020-10-21 DIAGNOSIS — M1611 Unilateral primary osteoarthritis, right hip: Secondary | ICD-10-CM | POA: Diagnosis not present

## 2020-10-21 DIAGNOSIS — Z9181 History of falling: Secondary | ICD-10-CM | POA: Diagnosis not present

## 2020-10-21 DIAGNOSIS — Z8551 Personal history of malignant neoplasm of bladder: Secondary | ICD-10-CM | POA: Diagnosis not present

## 2020-10-25 ENCOUNTER — Encounter (INDEPENDENT_AMBULATORY_CARE_PROVIDER_SITE_OTHER): Payer: Medicare Other | Admitting: Ophthalmology

## 2020-10-25 DIAGNOSIS — H353130 Nonexudative age-related macular degeneration, bilateral, stage unspecified: Secondary | ICD-10-CM | POA: Diagnosis not present

## 2020-10-25 DIAGNOSIS — M1611 Unilateral primary osteoarthritis, right hip: Secondary | ICD-10-CM | POA: Diagnosis not present

## 2020-10-25 DIAGNOSIS — I10 Essential (primary) hypertension: Secondary | ICD-10-CM | POA: Diagnosis not present

## 2020-10-25 DIAGNOSIS — I4891 Unspecified atrial fibrillation: Secondary | ICD-10-CM | POA: Diagnosis not present

## 2020-10-25 DIAGNOSIS — N400 Enlarged prostate without lower urinary tract symptoms: Secondary | ICD-10-CM | POA: Diagnosis not present

## 2020-10-25 DIAGNOSIS — E039 Hypothyroidism, unspecified: Secondary | ICD-10-CM | POA: Diagnosis not present

## 2020-11-03 DIAGNOSIS — M1611 Unilateral primary osteoarthritis, right hip: Secondary | ICD-10-CM | POA: Diagnosis not present

## 2020-11-03 DIAGNOSIS — I10 Essential (primary) hypertension: Secondary | ICD-10-CM | POA: Diagnosis not present

## 2020-11-03 DIAGNOSIS — I4891 Unspecified atrial fibrillation: Secondary | ICD-10-CM | POA: Diagnosis not present

## 2020-11-03 DIAGNOSIS — H353130 Nonexudative age-related macular degeneration, bilateral, stage unspecified: Secondary | ICD-10-CM | POA: Diagnosis not present

## 2020-11-03 DIAGNOSIS — E039 Hypothyroidism, unspecified: Secondary | ICD-10-CM | POA: Diagnosis not present

## 2020-11-03 DIAGNOSIS — N400 Enlarged prostate without lower urinary tract symptoms: Secondary | ICD-10-CM | POA: Diagnosis not present

## 2020-11-14 DIAGNOSIS — I10 Essential (primary) hypertension: Secondary | ICD-10-CM | POA: Diagnosis not present

## 2020-11-14 DIAGNOSIS — E039 Hypothyroidism, unspecified: Secondary | ICD-10-CM | POA: Diagnosis not present

## 2020-11-14 DIAGNOSIS — M1611 Unilateral primary osteoarthritis, right hip: Secondary | ICD-10-CM | POA: Diagnosis not present

## 2020-11-14 DIAGNOSIS — H35131 Retinopathy of prematurity, stage 2, right eye: Secondary | ICD-10-CM | POA: Diagnosis not present

## 2020-11-17 DIAGNOSIS — H353130 Nonexudative age-related macular degeneration, bilateral, stage unspecified: Secondary | ICD-10-CM | POA: Diagnosis not present

## 2020-11-17 DIAGNOSIS — E039 Hypothyroidism, unspecified: Secondary | ICD-10-CM | POA: Diagnosis not present

## 2020-11-17 DIAGNOSIS — M1611 Unilateral primary osteoarthritis, right hip: Secondary | ICD-10-CM | POA: Diagnosis not present

## 2020-11-17 DIAGNOSIS — I10 Essential (primary) hypertension: Secondary | ICD-10-CM | POA: Diagnosis not present

## 2020-11-17 DIAGNOSIS — N400 Enlarged prostate without lower urinary tract symptoms: Secondary | ICD-10-CM | POA: Diagnosis not present

## 2020-11-17 DIAGNOSIS — I4891 Unspecified atrial fibrillation: Secondary | ICD-10-CM | POA: Diagnosis not present

## 2020-11-20 DIAGNOSIS — Z8551 Personal history of malignant neoplasm of bladder: Secondary | ICD-10-CM | POA: Diagnosis not present

## 2020-11-20 DIAGNOSIS — N400 Enlarged prostate without lower urinary tract symptoms: Secondary | ICD-10-CM | POA: Diagnosis not present

## 2020-11-20 DIAGNOSIS — I4891 Unspecified atrial fibrillation: Secondary | ICD-10-CM | POA: Diagnosis not present

## 2020-11-20 DIAGNOSIS — H353130 Nonexudative age-related macular degeneration, bilateral, stage unspecified: Secondary | ICD-10-CM | POA: Diagnosis not present

## 2020-11-20 DIAGNOSIS — Z85828 Personal history of other malignant neoplasm of skin: Secondary | ICD-10-CM | POA: Diagnosis not present

## 2020-11-20 DIAGNOSIS — E039 Hypothyroidism, unspecified: Secondary | ICD-10-CM | POA: Diagnosis not present

## 2020-11-20 DIAGNOSIS — Z9181 History of falling: Secondary | ICD-10-CM | POA: Diagnosis not present

## 2020-11-20 DIAGNOSIS — M1611 Unilateral primary osteoarthritis, right hip: Secondary | ICD-10-CM | POA: Diagnosis not present

## 2020-11-20 DIAGNOSIS — I10 Essential (primary) hypertension: Secondary | ICD-10-CM | POA: Diagnosis not present

## 2020-11-21 DIAGNOSIS — N400 Enlarged prostate without lower urinary tract symptoms: Secondary | ICD-10-CM | POA: Diagnosis not present

## 2020-11-21 DIAGNOSIS — I4891 Unspecified atrial fibrillation: Secondary | ICD-10-CM | POA: Diagnosis not present

## 2020-11-21 DIAGNOSIS — H353130 Nonexudative age-related macular degeneration, bilateral, stage unspecified: Secondary | ICD-10-CM | POA: Diagnosis not present

## 2020-11-21 DIAGNOSIS — I10 Essential (primary) hypertension: Secondary | ICD-10-CM | POA: Diagnosis not present

## 2020-11-21 DIAGNOSIS — M1611 Unilateral primary osteoarthritis, right hip: Secondary | ICD-10-CM | POA: Diagnosis not present

## 2020-11-21 DIAGNOSIS — E039 Hypothyroidism, unspecified: Secondary | ICD-10-CM | POA: Diagnosis not present

## 2020-11-27 DIAGNOSIS — I4891 Unspecified atrial fibrillation: Secondary | ICD-10-CM | POA: Diagnosis not present

## 2020-11-27 DIAGNOSIS — E039 Hypothyroidism, unspecified: Secondary | ICD-10-CM | POA: Diagnosis not present

## 2020-11-27 DIAGNOSIS — H353130 Nonexudative age-related macular degeneration, bilateral, stage unspecified: Secondary | ICD-10-CM | POA: Diagnosis not present

## 2020-11-27 DIAGNOSIS — I10 Essential (primary) hypertension: Secondary | ICD-10-CM | POA: Diagnosis not present

## 2020-11-27 DIAGNOSIS — M1611 Unilateral primary osteoarthritis, right hip: Secondary | ICD-10-CM | POA: Diagnosis not present

## 2020-11-27 DIAGNOSIS — N400 Enlarged prostate without lower urinary tract symptoms: Secondary | ICD-10-CM | POA: Diagnosis not present

## 2020-11-30 ENCOUNTER — Ambulatory Visit (INDEPENDENT_AMBULATORY_CARE_PROVIDER_SITE_OTHER): Payer: Medicare Other | Admitting: Ophthalmology

## 2020-11-30 ENCOUNTER — Other Ambulatory Visit: Payer: Self-pay

## 2020-11-30 ENCOUNTER — Encounter (INDEPENDENT_AMBULATORY_CARE_PROVIDER_SITE_OTHER): Payer: Self-pay | Admitting: Ophthalmology

## 2020-11-30 DIAGNOSIS — H33001 Unspecified retinal detachment with retinal break, right eye: Secondary | ICD-10-CM

## 2020-11-30 DIAGNOSIS — H353134 Nonexudative age-related macular degeneration, bilateral, advanced atrophic with subfoveal involvement: Secondary | ICD-10-CM | POA: Diagnosis not present

## 2020-11-30 NOTE — Assessment & Plan Note (Signed)
Dry atrophy geographic area accounts for acuity

## 2020-11-30 NOTE — Assessment & Plan Note (Signed)
Renal mass post repair retinal detachment via vitrectomy temporary Perfluoron, injection C3F8 gas, retina looks great as a test completely

## 2020-11-30 NOTE — Progress Notes (Signed)
11/30/2020     CHIEF COMPLAINT Patient presents for Retina Follow Up (8 Wk F/U OD//Pt sts OD is "great." Pt sts bubble is gone OD. Pt is off all RX drops. Pt reports occasional scratchy sensation OD. )   HISTORY OF PRESENT ILLNESS: Tanner Ho is a 80 y.o. male who presents to the clinic today for:   HPI     Retina Follow Up           Diagnosis: Retinal Break/Detachment   Laterality: right eye   Onset: 8 weeks ago   Severity: moderate   Duration: 8 weeks   Course: gradually improving   Comments: 8 Wk F/U OD  Pt sts OD is "great." Pt sts bubble is gone OD. Pt is off all RX drops. Pt reports occasional scratchy sensation OD.        Last edited by Milly Jakob, [place] on 11/30/2020  8:29 AM.      Referring physician: [name], MD [address],  [place] [address]  HISTORICAL INFORMATION:   Selected notes from the MEDICAL RECORD NUMBER       CURRENT MEDICATIONS: Current Outpatient Medications (Ophthalmic Drugs)  Medication Sig   Hypromellose 0.2 % SOLN Apply to eye.   loteprednol (LOTEMAX) 0.5 % ophthalmic suspension Place 1 drop into the right eye daily.   Polyethyl Glycol-Propyl Glycol 0.4-0.3 % SOLN Apply 2 drops to eye daily as needed (for dry eeys).   No current facility-administered medications for this visit. (Ophthalmic Drugs)   Current Outpatient Medications (Other)  Medication Sig   acetaminophen (TYLENOL) 650 MG CR tablet Take 650 mg by mouth every 8 (eight) hours as needed for pain.   alfuzosin (UROXATRAL) 10 MG 24 hr tablet Take 10 mg by mouth daily.   amiodarone (PACERONE) 200 MG tablet Take 1 tablet (200 mg total) by mouth daily. Take a whole tablet every other day and half tablet the other days   ascorbic acid (VITAMIN C) 1000 MG tablet Take 1,000 mg by mouth daily.   chlorthalidone (HYGROTON) 25 MG tablet TAKE 1/2 TABLET (12.5 MG TOTAL) BY MOUTH 2 (TWO) TIMES A WEEK.   Cholecalciferol (VITAMIN D-3 PO) Take 1 capsule by  mouth daily.    finasteride (PROSCAR) 5 MG tablet Take 5 mg by mouth daily.   hydrocortisone 2.5 % cream Apply 1 application topically as needed.    Levothyroxine Sodium 75 MCG CAPS Take 75 mcg by mouth daily.    linaclotide (LINZESS) 145 MCG CAPS capsule Take 1 capsule (145 mcg total) by mouth as needed.   loratadine (CLARITIN) 10 MG tablet Take 10 mg by mouth daily.   losartan (COZAAR) 25 MG tablet TAKE 1 TABLET BY MOUTH EVERY DAY   metoprolol tartrate (LOPRESSOR) 25 MG tablet as needed.    Multiple Vitamins-Minerals (PRESERVISION AREDS) CAPS Take 1 capsule by mouth 2 (two) times daily. VITAMIN A FREE   NON FORMULARY Take by mouth.   NON FORMULARY Take by mouth.   Rivaroxaban (XARELTO) 15 MG TABS tablet Take 1 tablet (15 mg total) by mouth daily with supper.   rosuvastatin (CRESTOR) 5 MG tablet TAKE 1 TABLET BY MOUTH DAILY AT 6 PM.   vitamin B-12 (CYANOCOBALAMIN) 1000 MCG tablet Take 1,000 mcg by mouth daily.   No current facility-administered medications for this visit. (Other)      REVIEW OF SYSTEMS:    ALLERGIES Allergies  Allergen Reactions   Ciprofloxacin Other (See Comments)    Cannot take  due to currently taking Amiodarone   Hytrin [Terazosin] Other (See Comments)    Syncopal event     PAST MEDICAL HISTORY Past Medical History:  Diagnosis Date   Adrenal mass ([place])    Asthma    BPH (benign prostatic hyperplasia)    CKD (chronic kidney disease), stage III ([facility])    GERD (gastroesophageal reflux disease)    H/O cardiovascular stress test 03/2012   EKG with 1-2 mm ST segment depression but normal perfusion images. Followup metastases test reduced exercise effort and functional status and determinate for myocardial dysfunction   History of cardiac monitoring    a. 2015: cardiac monitor revealed episodes of PACs, bradycardia, with heart rates down to 48, 4 beat PSVT, 6 beat run of NSVT at a rate of 131. There was no recurrent afib.    Hyperlipidemia LDL goal < 100     Hypertension    Hypothyroidism    Macular degeneration    MVP (mitral valve prolapse)    on cardiac cath - not mentioned on 2014 echo.   NSVT (nonsustained ventricular tachycardia) ([facility])    PAF (paroxysmal atrial fibrillation) ([place])    last episode 03/2012   Pericarditis    Premature atrial contractions    Sinus bradycardia    Subconjunctival hemorrhage of left eye 10/26/2019   The condition of the involved eye is that of a subconjunctival hemorrhage.  These are often spontaneous but are also often at or near the site of low location of an injection should to be placed into the eye.  In the absence of direct blunt or severe trauma these will typically resolve on their own spontaneously and have no impact on the vision.    These are very similar to having a bruise in your   Past Surgical History:  Procedure Laterality Date   ABDOMINAL VASCULAR ULTRASOUND EVALUATION  12/20/2008   mild non-obstructive plaque, abdominal aorta, with mild calcification at the bifurcation-no obstruction, no evidence for aneurysm of the abdominal aorta   CARDIAC CATHETERIZATION  01/04/1999   hyperdynamic LV function, probable angiographic mitral valve prolapse, systolic muscle bridging of the mid LAD without obstructive disease, systolic narrowing up to 58%   Cardiopulmonary MET-test  07/20/2012   reduced exercise effort and functional status, peak max O2 comsumption was only 58% of predicted, cardiovascular status was interpreted as indeterminate for myocardial dysfunction due to the suboptimal peak cardiovascular stress load.    FAMILY HISTORY Family History  Problem Relation Age of Onset   Stroke Mother    Stroke Paternal Grandfather     SOCIAL HISTORY Social History   Tobacco Use   Smoking status: Former    Pack years: 0.00   Smokeless tobacco: Never  Vaping Use   Vaping Use: Never used  Substance Use Topics   Alcohol use: No   Drug use: No         OPHTHALMIC EXAM:  Base Eye Exam     Visual  Acuity (ETDRS)       Right Left   Dist [place] 20/400 20/200 -1   Dist ph [place] NI NI         Tonometry (Tonopen, 8:33 AM)       Right Left   Pressure 08 08         Pupils       Dark Light Shape React APD   Right 5 5 Round Minimal None   Left 4 3 Round Slow None  Visual Fields (Counting fingers)       Left Right    Full    Restrictions  Total superior temporal deficiency         Extraocular Movement       Right Left    Full Full         Neuro/Psych     Oriented x3: Yes   Mood/Affect: Normal         Dilation     Right eye: 1.0% Mydriacyl, 2.5% Phenylephrine @ 8:33 AM           Slit Lamp and Fundus Exam     External Exam       Right Left   External Normal          Slit Lamp Exam       Right Left   Lids/Lashes Normal Normal   Conjunctiva/Sclera 1+ Injection, typical post surgical appearance Subconjunctival hemorrhage, old minor and clearing   Cornea Clear Clear   Anterior Chamber Deep and quiet Deep and quiet   Iris Round and reactive Round and reactive   Lens Posterior chamber intraocular lens Posterior chamber intraocular lens   Anterior Vitreous Clear vitrectomized,  Normal         Fundus Exam       Right Left   Posterior Vitreous Clear, vitrectomized,     Disc Normal    C/D Ratio 0.0    Macula Advanced age related macular degeneration, Geographic atrophy 4 disc areas in size confluent, no hemorrhage, no macular thickening    Vessels Normal    Periphery Retina attached, good laser retinopexy, temporal and inferorly             IMAGING AND PROCEDURES  Imaging and Procedures for 11/30/20  Color Fundus Photography Optos - OU - Both Eyes       Right Eye Progression has improved. Macula : geographic atrophy. Vessels : normal observations.   Left Eye Progression has been stable. Disc findings include normal observations. Macula : geographic atrophy. Vessels : normal observations. Periphery : normal observations.    Notes Good retinopexy temporally and inferiorly at the sites of subretinal fluid drainage OD, a attached, good retinopexy.    Retina nicely attached.  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  Patient has ongoing foreign body sensation is a mild degree feeling like "rough" OD and sometimes OS.  Do recommend use of preservative-free teardrops artificial teardrops topically as needed to each eye for this discomfort  2.  I reassured the patient there is no active disease from this dry eye condition  3.  Ophthalmic Meds Ordered this visit:  No orders of the defined types were placed in this encounter.      Return in about 5 months (around 05/02/2021) for DILATE OU, OCT, COLOR FP.  There are no Patient Instructions  on file for this visit.   Explained the diagnoses, plan, and follow up with the patient and they expressed understanding.  Patient expressed understanding of the importance of proper follow up care.   Clent Demark Tyreke Kaeser M.D. Diseases & Surgery of the Retina and Vitreous Retina & Diabetic [place] 11/30/20     Abbreviations: M myopia (nearsighted); A astigmatism; H hyperopia (farsighted); P presbyopia; Mrx spectacle prescription;  CTL contact lenses; OD right eye; OS left eye; OU both eyes  XT exotropia; ET esotropia;  PEK punctate epithelial keratitis; PEE punctate epithelial erosions; DES dry eye syndrome; MGD meibomian gland dysfunction; ATs artificial tears; PFAT's preservative free artificial tears; [place] nuclear sclerotic cataract; PSC posterior subcapsular cataract; ERM epi-retinal membrane; PVD posterior vitreous detachment; RD retinal detachment; DM diabetes mellitus; DR diabetic retinopathy; NPDR non-proliferative diabetic retinopathy; PDR proliferative diabetic retinopathy; CSME clinically significant macular edema; DME diabetic macular edema; dbh dot blot hemorrhages; CWS cotton wool spot; POAG primary open angle glaucoma; C/D cup-to-disc ratio; HVF humphrey visual field; GVF goldmann visual field; OCT optical coherence tomography; IOP intraocular pressure; BRVO Branch retinal vein occlusion; CRVO central retinal vein occlusion; CRAO central retinal artery occlusion; BRAO branch retinal artery occlusion; RT retinal tear; SB scleral buckle; PPV pars plana vitrectomy; VH Vitreous hemorrhage; PRP panretinal laser photocoagulation; IVK intravitreal kenalog; VMT vitreomacular traction; MH Macular hole;  NVD neovascularization of the disc; NVE neovascularization elsewhere; AREDS age related eye disease study; ARMD age related macular degeneration; POAG primary open angle glaucoma; EBMD epithelial/anterior basement membrane dystrophy; ACIOL anterior chamber intraocular lens; IOL intraocular lens; PCIOL posterior chamber intraocular lens; Phaco/IOL phacoemulsification with intraocular lens placement; [place] photorefractive keratectomy; LASIK laser assisted in situ keratomileusis; HTN hypertension; DM diabetes mellitus; COPD chronic obstructive pulmonary disease

## 2020-12-05 DIAGNOSIS — M1611 Unilateral primary osteoarthritis, right hip: Secondary | ICD-10-CM | POA: Diagnosis not present

## 2020-12-05 DIAGNOSIS — E039 Hypothyroidism, unspecified: Secondary | ICD-10-CM | POA: Diagnosis not present

## 2020-12-05 DIAGNOSIS — I4891 Unspecified atrial fibrillation: Secondary | ICD-10-CM | POA: Diagnosis not present

## 2020-12-05 DIAGNOSIS — H353130 Nonexudative age-related macular degeneration, bilateral, stage unspecified: Secondary | ICD-10-CM | POA: Diagnosis not present

## 2020-12-05 DIAGNOSIS — I10 Essential (primary) hypertension: Secondary | ICD-10-CM | POA: Diagnosis not present

## 2020-12-05 DIAGNOSIS — N400 Enlarged prostate without lower urinary tract symptoms: Secondary | ICD-10-CM | POA: Diagnosis not present

## 2021-01-17 DIAGNOSIS — L570 Actinic keratosis: Secondary | ICD-10-CM | POA: Diagnosis not present

## 2021-01-17 DIAGNOSIS — Z85828 Personal history of other malignant neoplasm of skin: Secondary | ICD-10-CM | POA: Diagnosis not present

## 2021-01-17 DIAGNOSIS — L821 Other seborrheic keratosis: Secondary | ICD-10-CM | POA: Diagnosis not present

## 2021-01-17 DIAGNOSIS — L905 Scar conditions and fibrosis of skin: Secondary | ICD-10-CM | POA: Diagnosis not present

## 2021-01-17 DIAGNOSIS — D1801 Hemangioma of skin and subcutaneous tissue: Secondary | ICD-10-CM | POA: Diagnosis not present

## 2021-01-18 DIAGNOSIS — K4090 Unilateral inguinal hernia, without obstruction or gangrene, not specified as recurrent: Secondary | ICD-10-CM | POA: Diagnosis not present

## 2021-01-24 DIAGNOSIS — I739 Peripheral vascular disease, unspecified: Secondary | ICD-10-CM | POA: Diagnosis not present

## 2021-01-24 DIAGNOSIS — L602 Onychogryphosis: Secondary | ICD-10-CM | POA: Diagnosis not present

## 2021-01-24 DIAGNOSIS — L84 Corns and callosities: Secondary | ICD-10-CM | POA: Diagnosis not present

## 2021-02-05 ENCOUNTER — Other Ambulatory Visit: Payer: Self-pay | Admitting: Cardiovascular Disease

## 2021-02-06 ENCOUNTER — Ambulatory Visit: Payer: Self-pay | Admitting: Surgery

## 2021-02-06 DIAGNOSIS — K429 Umbilical hernia without obstruction or gangrene: Secondary | ICD-10-CM | POA: Diagnosis not present

## 2021-02-06 DIAGNOSIS — K4090 Unilateral inguinal hernia, without obstruction or gangrene, not specified as recurrent: Secondary | ICD-10-CM | POA: Diagnosis not present

## 2021-02-06 NOTE — Telephone Encounter (Signed)
Prescription refill request for Xarelto received.  Indication:afib Last office visit:kelly 09/04/20 Weight:72.6kg Age:78mScr:1.63 03/13/20 CrCl:37.1

## 2021-02-06 NOTE — H&P (Signed)
REFERRING PHYSICIAN:  Alden Server, MD  Cardiology - Dr. Shelva Majestic   PROVIDER:  Carlean Jews, MD   MRN: [id number] DOB: 11-Aug-1940    Subjective    Chief Complaint: Inguinal Hernia (Right/)       History of Present Illness: Tanner Ho is a 80 y.o. male who is seen today as an office consultation at the request of Dr. Shelia Media for evaluation of Inguinal Hernia (Right/) .     This is an 80 year old male with mitral valve prolapse, hypertension, hyperlipidemia, hypothyroidism, paroxysmal atrial fibrillation on Xarelto, who presents with a complaint of a right inguinal hernia.  The patient states that he noticed this within the last month.  It has become fairly large and causes some discomfort.  He denies any obstructive symptoms.  He does have a small umbilical hernia that he has had for many years but this causes minimal discomfort.  It is slightly larger.  No imaging has been performed.  The patient has chronic constipation and he is currently on Linzess.     Review of Systems: A complete review of systems was obtained from the patient.  I have reviewed this information and discussed as appropriate with the patient.  See HPI as well for other ROS.   Review of Systems  Constitutional: Negative.   HENT: Negative.   Eyes: Positive for blurred vision.  Respiratory: Negative.   Cardiovascular: Positive for palpitations.  Gastrointestinal: Positive for constipation.  Genitourinary: Negative.   Musculoskeletal: Negative.   Skin: Negative.   Neurological: Negative.   Endo/Heme/Allergies: Negative.   Psychiatric/Behavioral: Negative.         Medical History: Past Medical History      Past Medical History:  Diagnosis Date   Arrhythmia     Arthritis     Chronic kidney disease     GERD (gastroesophageal reflux disease)     History of cancer     Hypertension     Thyroid disease             Patient Active Problem List  Diagnosis   Abdominal pain, epigastric    Adrenal mass (CMS-HCC)   Asthma   Bladder cancer (CMS-HCC)   BPH (benign prostatic hyperplasia)   Chronic anticoagulation   Chronic kidney disease, stage 3 unspecified (CMS-HCC)   Dyslipidemia   Essential (primary) hypertension   GERD (gastroesophageal reflux disease)   Hypothyroidism, unspecified   Macular degeneration   Pericarditis   Non-recurrent unilateral inguinal hernia without obstruction or gangrene   Umbilical hernia without obstruction or gangrene      Past Surgical History       Past Surgical History:  Procedure Laterality Date   APPENDECTOMY       bladder cancer       CATARACT EXTRACTION       detached retina N/A     PROSTATE SURGERY            Allergies       Allergies  Allergen Reactions   Ciprofloxacin Other (See Comments)      Cannot take due to currently taking Amiodarone Cannot take due to currently taking Amiodarone     Terazosin Other (See Comments)      Syncopal event  Syncopal event                 Current Outpatient Medications on File Prior to Visit  Medication Sig Dispense Refill   acetaminophen (TYLENOL) 650 MG ER tablet Take 650 mg  by mouth every 8 (eight) hours as needed       alfuzosin (UROXATRAL) 10 mg ER tablet alfuzosin ER 10 mg tablet,extended release 24 hr       AMIOdarone (PACERONE) 200 MG tablet amiodarone 200 mg tablet       antiox #8/om3/dha/epa/lut/zeax (PRESERVISION AREDS 2, OMEGA-3, ORAL) Take by mouth       chlorthalidone 25 MG tablet chlorthalidone 25 mg tablet       cholecalciferol, vitamin D3, (CHOLECALCIFEROL, VIT D3,,BULK,) 100,000 unit/gram Powd Take by mouth       cyanocobalamin (VITAMIN B12) 1000 MCG tablet Take by mouth       finasteride (PROSCAR) 5 mg tablet finasteride 5 mg tablet       fluorometholone (FML) 0.1 % ophthalmic suspension fluorometholone 0.1 % eye drops,suspension       hydrocortisone 2.5 % cream APPLY A SMALL AMOUNT TO AFFECTED AREA TWICE A DAY APPLY TWICE DAILY TO EYEBROWS       ketoconazole  (NIZORAL) 2 % cream APPLY A SMALL AMOUNT TO AFFECTED AREA TWICE A DAY APPLY TO GROIN TWICE DAILY       levothyroxine (SYNTHROID) 75 MCG tablet levothyroxine 75 mcg tablet       linaCLOtide (LINZESS) 145 mcg capsule Linzess 145 mcg capsule       loratadine (CLARITIN) 10 mg tablet Take 10 mg by mouth once daily       losartan (COZAAR) 25 MG tablet Take 25 mg by mouth once daily       melatonin 3 mg Cap Take by mouth       metoprolol tartrate (LOPRESSOR) 25 MG tablet metoprolol tartrate 25 mg tablet       ofloxacin (OCUFLOX) 0.3 % ophthalmic solution ofloxacin 0.3 % eye drops       pantoprazole (PROTONIX) 40 MG DR tablet pantoprazole 40 mg tablet,delayed release       rivaroxaban (XARELTO) 15 mg tablet TAKE 1 TABLET (15 MG TOTAL) BY MOUTH DAILY WITH SUPPER.       rosuvastatin (CRESTOR) 5 MG tablet rosuvastatin 5 mg tablet        No current facility-administered medications on file prior to visit.      Family History       Family History  Problem Relation Age of Onset   Stroke Mother     Skin cancer Mother     High blood pressure (Hypertension) Mother     Hyperlipidemia (Elevated cholesterol) Mother     High blood pressure (Hypertension) Father          Social History       Tobacco Use  Smoking Status Former Smoker  Smokeless Tobacco Never Used      Social History  Social History        Socioeconomic History   Marital status: Married  Tobacco Use   Smoking status: Former Smoker   Smokeless tobacco: Never Used  Scientific laboratory technician Use: Never used  Substance and Sexual Activity   Alcohol use: Never   Drug use: Never        Objective:         Vitals:    02/06/21 1501  BP: 138/82  Pulse: 62  Temp: 36.9 C (98.5 F)  SpO2: 98%  Weight: 70.9 kg (156 lb 6.4 oz)  Height: 175.3 cm ('5\' 9"'$ )    Body mass index is 23.1 kg/m.   Physical Exam    Constitutional:  WDWN in NAD, conversant, no obvious deformities; lying  in bed comfortably Eyes:  Pupils equal, round;  sclera anicteric; moist conjunctiva; no lid lag HENT:  Oral mucosa moist; good dentition  Neck:  No masses palpated, trachea midline; no thyromegaly Lungs:  CTA bilaterally; normal respiratory effort CV:  Regular rate and rhythm; no murmurs; extremities well-perfused with no edema Abd:  +bowel sounds, soft, non-tender, no palpable organomegaly; small umbilical hernia with a small amount of preperitoneal fat - reducible GU: Bilateral descended testes, no testicular masses, large right inguinal hernia that is reducible when he is supine, no sign of left inguinal hernia Musc: Walks with a rolling walker; frail; no apparent clubbing or cyanosis in extremities Lymphatic:  No palpable cervical or axillary lymphadenopathy Skin:  Warm, dry; no sign of jaundice Psychiatric - alert and oriented x 4; calm mood and affect     Assessment and Plan:  Diagnoses and all orders for this visit:   Non-recurrent unilateral inguinal hernia without obstruction or gangrene   Umbilical hernia without obstruction or gangrene     Right inguinal hernia repair with mesh, umbilical hernia repair.The surgical procedure has been discussed with the patient.  Potential risks, benefits, alternative treatments, and expected outcomes have been explained.  All of the patient's questions at this time have been answered.  The likelihood of reaching the patient's treatment goal is good.  The patient understand the proposed surgical procedure and wishes to proceed.    Cardiac clearance first   [place], MD  02/06/2021 4:16 PM

## 2021-02-07 ENCOUNTER — Telehealth: Payer: Self-pay

## 2021-02-07 NOTE — Telephone Encounter (Addendum)
   Name: Tanner Ho  DOB: [birth year]  MRN: [id number]   Primary Cardiologist: Shelva Majestic, MD  Chart reviewed as part of pre-operative protocol coverage. Patient was contacted 02/07/2021 in reference to pre-operative risk assessment for pending surgery as outlined below.  Raymere Fran Salvia was last seen on 09/04/20 by Dr. Claiborne Billings.  Since that day, Tanner Ho has done well. He does not have a history of ischemic heart disease. He is limited by his eye sight but can walk the isles at [employer].   Per our clinical pharmacist: Per office protocol, patient can hold Xarelto for 2 days prior to procedure.    Therefore, based on ACC/AHA guidelines, the patient would be at acceptable risk for the planned procedure without further cardiovascular testing.   The patient was advised that if he develops new symptoms prior to surgery to contact our office to arrange for a follow-up visit, and he verbalized understanding.  I will route this recommendation to the requesting party via Epic fax function and remove from pre-op pool. Please call with questions.  Ledora Bottcher, PA 02/07/2021, 5:36 PM

## 2021-02-07 NOTE — Telephone Encounter (Signed)
   [facility] Pre-operative Risk Assessment    Patient Name: Tanner Ho  DOB: 1941-01-06 MRN: [id number]  HEARTCARE STAFF:  - IMPORTANT!!!!!! Under Visit Info/Reason for Call, type in Other and utilize the format Clearance MM/DD/YY or Clearance TBD. Do not use dashes or single digits. - Please review there is not already an duplicate clearance open for this procedure. - If request is for dental extraction, please clarify the # of teeth to be extracted. - If the patient is currently at the dentist's office, call Pre-Op Callback Staff (MA/nurse) to input urgent request.  - If the patient is not currently in the dentist office, please route to the Pre-Op pool.  Request for surgical clearance:  What type of surgery is being performed? Hernia Surgery  When is this surgery scheduled? TBD  What type of clearance is required (medical clearance vs. Pharmacy clearance to hold med vs. Both)? Cardiac  Are there any medications that need to be held prior to surgery and how long? Xarelto   Practice name and name of physician performing surgery? [facility] Tanner [place]a, MD  What is the office phone number? [phone]   7.   What is the office fax number? [name], LPN   8.   Anesthesia type (None, local, MAC, general) ? General    Tanner Ho 02/07/2021, 9:24 AM  _________________________________________________________________   (provider comments below)

## 2021-02-08 NOTE — Telephone Encounter (Signed)
Patient with diagnosis of afib on Xarelto for anticoagulation.    Procedure: Hernia Surgery Date of procedure: TBD  CHA2DS2-VASc Score = 3   This indicates a 3.2% annual risk of stroke. The patient's score is based upon: CHF History: 0 HTN History: 1 Diabetes History: 0 Stroke History: 0 Vascular Disease History: 0 Age Score: 2 Gender Score: 0     CrCl 34.9 ml/min  Per office protocol, patient can hold Xarelto for 2 days prior to procedure.

## 2021-02-09 DIAGNOSIS — Z23 Encounter for immunization: Secondary | ICD-10-CM | POA: Diagnosis not present

## 2021-02-14 ENCOUNTER — Telehealth: Payer: Self-pay | Admitting: Cardiovascular Disease

## 2021-02-14 MED ORDER — METOPROLOL TARTRATE 25 MG PO TABS: 12.5000 mg | ORAL_TABLET | Freq: Two times a day (BID) | ORAL | 3 refills | Status: AC | PRN

## 2021-02-14 NOTE — Telephone Encounter (Signed)
Patient c/o Palpitations:  High priority if patient c/o lightheadedness, shortness of breath, or chest pain  How long have you had palpitations/irregular HR/ Afib? Are you having the symptoms now? Yes for about an hour  Are you currently experiencing lightheadedness, SOB or CP? No to all 3  Do you have a history of afib (atrial fibrillation) or irregular heart rhythm?  YES  Have you checked your BP or HR? (document readings if available): NO  Are you experiencing any other symptoms? JUST AN IRREGULAR HEARTBEAT FROM 83-120  per Apple Watch

## 2021-02-14 NOTE — Telephone Encounter (Signed)
I spoke with the pts wife and she says his HR is now 80-90... BP 113/62... she has not given him the new RX of lopressor yet.. he is still feeling well... she will continue to monitor and recheck his HR at 2 pm and if back up she will give the Lopressor.. she will also make sure that he is hydrating well... he is eating good today.   I will call back later to check on him and her.

## 2021-02-14 NOTE — Telephone Encounter (Signed)
Patient's wife calling in with questions about the medication that was instructed to give patient. Please advise

## 2021-02-14 NOTE — Telephone Encounter (Signed)
Called to check on the pt and at 2:15 pm his BP was 105/68 and HR 83 and she says that he is feeling well and hydrating... they will continue to monitor and call if anything changes prior to his appt tomorrow.

## 2021-02-14 NOTE — Telephone Encounter (Signed)
Pt and his wife called to report that he woke up this morning feeling like he is in Afib.. he says his heart is beating irregular and his wife says his HR feel fast.. he denies any other symptoms such as dizziness, dyspnea, no chest discomfort..she says that she has Prn Lopressor from Dr. Claiborne Billings for afib but it is out of date from 2019.. she gave him one at 10 am.. I spoke with the Pharm D and he says she should go ahead and get the updated RX and go ahead and give him another one when she gets it.Marland Kitchen His Apple Watch is showing AFIB with rate fluctuating between 80-120... his BO 122/60.   I asked her to go ahead and get the lopressor that I just sent in.. she will drive to the pharmacy not the pt.   She will continue to monitor and if he develops any symptoms or his heart rate stays elevated she will take him ti the ED. Otherwise he will see Laurann Montana NP at 1:30 pm tomorrow.   I will call them back in an a hour or so to see how he is doing.. they will call me back sooner if needed, he has not had any missed doses of his Xarelto and has has 200 mg of Amiodarone this morning.

## 2021-02-15 ENCOUNTER — Ambulatory Visit (INDEPENDENT_AMBULATORY_CARE_PROVIDER_SITE_OTHER): Payer: Medicare Other | Admitting: Family

## 2021-02-15 ENCOUNTER — Other Ambulatory Visit: Payer: Self-pay

## 2021-02-15 ENCOUNTER — Encounter (HOSPITAL_BASED_OUTPATIENT_CLINIC_OR_DEPARTMENT_OTHER): Payer: Self-pay | Admitting: Family

## 2021-02-15 VITALS — BP 110/60 | HR 92 | Ht 68.0 in | Wt 154.0 lb

## 2021-02-15 DIAGNOSIS — I341 Nonrheumatic mitral (valve) prolapse: Secondary | ICD-10-CM | POA: Diagnosis not present

## 2021-02-15 DIAGNOSIS — Z7901 Long term (current) use of anticoagulants: Secondary | ICD-10-CM | POA: Diagnosis not present

## 2021-02-15 DIAGNOSIS — E785 Hyperlipidemia, unspecified: Secondary | ICD-10-CM

## 2021-02-15 DIAGNOSIS — I10 Essential (primary) hypertension: Secondary | ICD-10-CM

## 2021-02-15 DIAGNOSIS — I4819 Other persistent atrial fibrillation: Secondary | ICD-10-CM

## 2021-02-15 DIAGNOSIS — N1832 Chronic kidney disease, stage 3b: Secondary | ICD-10-CM | POA: Diagnosis not present

## 2021-02-15 MED ORDER — AMIODARONE HCL 200 MG PO TABS: 200.0000 mg | ORAL_TABLET | Freq: Every day | ORAL | 3 refills | Status: DC

## 2021-02-15 NOTE — Progress Notes (Signed)
Office Visit    Patient Name: Tanner Ho Date of Encounter: 02/17/2021  PCP:  [name], [facility]  Cardiologist:  Shelva Majestic, MD  Advanced Practice Provider:  No care team member to display Electrophysiologist:  None    Chief Complaint    Tanner Ho is a 80 y.o. male with a hx of MVP, HTN, HLD, hypothyroidism, PAF, chronic anticoagulation  presents today for atrial fibrillation   Past Medical History    Past Medical History:  Diagnosis Date   Adrenal mass ([place])    Asthma    BPH (benign prostatic hyperplasia)    CKD (chronic kidney disease), stage III ([place])    GERD (gastroesophageal reflux disease)    H/O cardiovascular stress test 03/2012   EKG with 1-2 mm ST segment depression but normal perfusion images. Followup metastases test reduced exercise effort and functional status and determinate for myocardial dysfunction   History of cardiac monitoring    a. 2015: cardiac monitor revealed episodes of PACs, bradycardia, with heart rates down to 48, 4 beat PSVT, 6 beat run of NSVT at a rate of 131. There was no recurrent afib.    Hyperlipidemia LDL goal < 100    Hypertension    Hypothyroidism    Macular degeneration    MVP (mitral valve prolapse)    on cardiac cath - not mentioned on 2014 echo.   NSVT (nonsustained ventricular tachycardia) (HCC)    PAF (paroxysmal atrial fibrillation) ([place])    last episode 03/2012   Pericarditis    Premature atrial contractions    Sinus bradycardia    Subconjunctival hemorrhage of left eye 10/26/2019   The condition of the involved eye is that of a subconjunctival hemorrhage.  These are often spontaneous but are also often at or near the site of low location of an injection should to be placed into the eye.  In the absence of direct blunt or severe trauma these will typically resolve on their own spontaneously and have no impact on the vision.    These are very similar to having a bruise in your    Past Surgical History:  Procedure Laterality Date   ABDOMINAL VASCULAR ULTRASOUND EVALUATION  12/20/2008   mild non-obstructive plaque, abdominal aorta, with mild calcification at the bifurcation-no obstruction, no evidence for aneurysm of the abdominal aorta   CARDIAC CATHETERIZATION  01/04/1999   hyperdynamic LV function, probable angiographic mitral valve prolapse, systolic muscle bridging of the mid LAD without obstructive disease, systolic narrowing up to 37%   Cardiopulmonary MET-test  07/20/2012   reduced exercise effort and functional status, peak max O2 comsumption was only 58% of predicted, cardiovascular status was interpreted as indeterminate for myocardial dysfunction due to the suboptimal peak cardiovascular stress load.    Allergies  Allergies  Allergen Reactions   Ciprofloxacin Other (See Comments)    Cannot take due to currently taking Amiodarone   Hytrin [Terazosin] Other (See Comments)    Syncopal event     History of Present Illness    Tanner Ho is a 80 y.o. male with a hx of MVP, HTN, HLD, hypothyroidism, PAF, chronic anticoagulation last seen 09/04/20.  Previous cardiac testing detailed below. Nuclear perfusion study October 2013 normal perfusion with 1 to 2 mm ST segment depression at peak stress and possibility of microvascular etiology without EKG abnormalities Nuclear perfusion study November 2014 low risk with mild diaphragmatic attenuation but no scar or ischemia Cardiac monitor 2015  with episodes of PACs, bradycardia, 6 beat run wide-complex tachycardia 131 bpm with breakthrough atrial fibrillation while on amiodarone 100 mg Echocardiogram 09/2016 LVEF 55-60%, no R WMA, mild AI, mild late systolic bileaflet prolapse of the mitral valve  Amiodarone has previously been adjusted to alternate 102 100 mg every other day due to history of bradycardia.  His beta-blocker has previously been discontinued due to bradycardia.  Previous bouts of atrial fibrillation  he has been able to convert pharmacologically.  Mild bilateral edema has been controlled with chlorthalidone.  He contact the office 02/14/2021 noting recurrent atrial fibrillation.  He presents today for follow-up with his wife.  Tells me he notices his A. fib when his heart rate is elevated his heart rate routinely at home was in the 50s or 60s.  He took 1/2 tablet of expired metoprolol tartrate yesterday and his heart rate has been <110 since that time.  Denies chest pain, nausea, tightness.  Reports mild shortness of breath.  He does have upcoming elective hernia surgery and is wondering how to proceed.   EKGs/Labs/Other Studies Reviewed:   The following studies were reviewed today:   EKG:  EKG is ordered today.  The ekg ordered today demonstrates rate controlled atrial fib 92 bpm.  Recent Labs: 02/15/2021: BUN 22; Creatinine, Ser 1.40; Hemoglobin 13.5; Magnesium 2.4; Platelets 160; Potassium 5.2; Sodium 138  Recent Lipid Panel    Component Value Date/Time   CHOL 120 (L) 12/28/2014 0924   TRIG 85 12/28/2014 0924   HDL 49 12/28/2014 0924   CHOLHDL 2.4 12/28/2014 0924   VLDL 17 12/28/2014 0924   LDLCALC 54 12/28/2014 0924    Risk Assessment/Calculations:   CHA2DS2-VASc Score = 3   This indicates a 3.2% annual risk of stroke. The patient's score is based upon: CHF History: 0 HTN History: 1 Diabetes History: 0 Stroke History: 0 Vascular Disease History: 0 Age Score: 2 Gender Score: 0     Home Medications   Current Meds  Medication Sig   acetaminophen (TYLENOL) 325 MG tablet Take 650 mg by mouth every 6 (six) hours as needed for moderate pain.   acetaminophen (TYLENOL) 650 MG CR tablet Take 650 mg by mouth at bedtime as needed for pain.   alfuzosin (UROXATRAL) 10 MG 24 hr tablet Take 10 mg by mouth daily.   ascorbic acid (VITAMIN C) 1000 MG tablet Take 1,000 mg by mouth daily.   chlorthalidone (HYGROTON) 25 MG tablet TAKE 1/2 TABLET (12.5 MG TOTAL) BY MOUTH 2 (TWO) TIMES  A WEEK.   Cholecalciferol (VITAMIN D) 50 MCG (2000 UT) tablet Take 2,000 Units by mouth daily.   docusate sodium (COLACE) 100 MG capsule Take 100 mg by mouth 2 (two) times daily.   finasteride (PROSCAR) 5 MG tablet Take 5 mg by mouth daily.   hydrocortisone 2.5 % cream Apply 1 application topically as needed (itching).   ketoconazole (NIZORAL) 2 % cream Apply 1 application topically daily as needed for irritation.   levothyroxine (SYNTHROID) 75 MCG tablet Take 75 mcg by mouth daily.    linaclotide (LINZESS) 145 MCG CAPS capsule Take 1 capsule (145 mcg total) by mouth as needed.   loratadine (CLARITIN) 10 MG tablet Take 10 mg by mouth daily.   losartan (COZAAR) 25 MG tablet TAKE 1 TABLET BY MOUTH EVERY DAY   loteprednol (LOTEMAX) 0.5 % ophthalmic suspension Place 1 drop into the right eye daily.   melatonin 3 MG TABS tablet Take 3 mg by mouth at bedtime.  metoprolol tartrate (LOPRESSOR) 25 MG tablet Take 0.5 tablets (12.5 mg total) by mouth 2 (two) times daily as needed (afib).   Multiple Vitamins-Minerals (PRESERVISION AREDS) CAPS Take 1 capsule by mouth 2 (two) times daily.   Polyethyl Glycol-Propyl Glycol 0.4-0.3 % SOLN Place 2 drops into both eyes 2 (two) times daily as needed (for dry eyes).   rosuvastatin (CRESTOR) 5 MG tablet TAKE 1 TABLET BY MOUTH DAILY AT 6 PM.   sodium chloride (OCEAN) 0.65 % SOLN nasal spray Place 1 spray into both nostrils as needed for congestion.   vitamin B-12 (CYANOCOBALAMIN) 1000 MCG tablet Take 1,000 mcg by mouth daily.   XARELTO 15 MG TABS tablet TAKE 1 TABLET (15 MG TOTAL) BY MOUTH DAILY WITH SUPPER.   [DISCONTINUED] amiodarone (PACERONE) 200 MG tablet Take 1 tablet (200 mg total) by mouth daily. Take a whole tablet every other day and half tablet the other days     Review of Systems      All other systems reviewed and are otherwise negative except as noted above.  Physical Exam    VS:  BP 110/60   Pulse 92   Ht 5' 8" (1.727 m)   Wt 154 lb (69.9 kg)    BMI 23.42 kg/m  , BMI Body mass index is 23.42 kg/m.  Wt Readings from Last 3 Encounters:  02/15/21 154 lb (69.9 kg)  09/04/20 160 lb (72.6 kg)  03/02/20 163 lb (73.9 kg)     GEN: Well nourished, well developed, in no acute distress. HEENT: normal. Neck: Supple, no JVD, carotid bruits, or masses. Cardiac: Irregularly irregular, no murmurs, rubs, or gallops. No clubbing, cyanosis, edema.  Radials/PT 2+ and equal bilaterally.  Respiratory:  Respirations regular and unlabored, clear to auscultation bilaterally. GI: Soft, nontender, nondistended. MS: No deformity or atrophy. Skin: Warm and dry, no rash. Neuro:  Strength and sensation are intact. Psych: Normal affect.  Assessment & Plan    PAF /chronic anticoagulation -recurrent atrial fibrillation.  To attempt to pharmacologically convert we will increase amiodarone to 200 mg daily.  He will continue metoprolol tartrate 12.5 mg as needed for heart rate persistently greater than 110 bpm.  We will not add daily dose beta-blocker due to previous bradycardia.  Appropriately anticoagulated with Eliquis 50 mg daily, denies missed doses.  Plan for TSH, magnesium, BMP, CBC to rule out thyroid, electrolyte, or anemia as etiology of recurrent atrial fibrillation.  Plan for echocardiogram for reassessment of MVP.  If echo stable and does not pharmacologically convert, plan to proceed with DCCV. Conversation initiated today.  If HR persistently <65 bpm he will contact office as would be sign NSR restored and plan to reduce Amiodarone to alternate 125m and 2076mQOD.   Preop clearance - Discussed risk and benefit of undergoing elective hernia repair while in atrial fibrillation. He and his wife prefer to restore NSR due to risk off anticoagulant for surgery. Will route to surgical team so they are aware.   HLD, LDL goal less than 70 -continue Crestor 5 mg daily. Heart healthy diet and regular cardiovascular exercise encouraged.    CKD 3B -  Careful titration of diuretic and antihypertensive.    HTN - BP well controlled. Continue current antihypertensive regimen.    MVP - update echo, as above.   Disposition: Follow up  as scheduled  with Dr. KeClaiborne Billings Signed, CaLoel DubonnetNP 02/17/2021, 5:44 PM [place]

## 2021-02-15 NOTE — Pre-Procedure Instructions (Signed)
Surgical Instructions    Your procedure is scheduled on Tuesday 02/20/21.   Report to [facility] Main Entrance "A" at 10:30 A.M., then check in with the Admitting office.  Call this number if you have problems the morning of surgery:  [phone]   If you have any questions prior to your surgery date call [phone]: Open Monday-Friday 8am-4pm    Remember:  Do not eat after midnight the night before your surgery  You may drink clear liquids until 09:30 A.M. the morning of your surgery.   Clear liquids allowed are: Water, Non-Citrus Juices (without pulp), Carbonated Beverages, Clear Tea, Black Coffee ONLY (NO MILK, CREAM OR POWDERED CREAMER of any kind), and Gatorade    Take these medicines the morning of surgery with A SIP OF WATER   amiodarone (PACERONE)   docusate sodium (COLACE)   finasteride (PROSCAR)  levothyroxine (SYNTHROID)  loratadine (CLARITIN)  metoprolol tartrate (LOPRESSOR)      Take these medicines if needed:  acetaminophen (TYLENOL)  alfuzosin (UROXATRAL)   linaclotide (LINZESS)   Polyethyl Glycol-Propyl Glycol   sodium chloride (OCEAN)  Please follow your surgeon's instructions regarding when to stop taking XARELTO. If you have not received instructions then you need to contact your surgeon's office for instructions.  As of today, STOP taking any Aspirin (unless otherwise instructed by your surgeon) Aleve, Naproxen, Ibuprofen, Motrin, Advil, Goody's, BC's, all herbal medications, fish oil, and all vitamins.          Do not wear jewelry or makeup Do not wear lotions, powders, perfumes/colognes, or deodorant. Do not shave 48 hours prior to surgery.  Men may shave face and neck. Do not bring valuables to the hospital. DO Not wear nail polish, gel polish, artificial nails, or any other type of covering on natural nails including finger and toenails. If patients have artificial nails, gel coating, etc. that need to be removed by a nail salon please have this  removed prior to surgery or surgery may need to be canceled/delayed if the surgeon/ anesthesia feels like the patient is unable to be adequately monitored.             [facility] is not responsible for any belongings or valuables.  Do NOT Smoke (Tobacco/Vaping)  24 hours prior to your procedure If you use a CPAP at night, you may bring your mask for your overnight stay.   Contacts, glasses, dentures or bridgework may not be worn into surgery, please bring cases for these belongings   For patients admitted to the hospital, discharge time will be determined by your treatment team.   Patients discharged the day of surgery will not be allowed to drive home, and someone needs to stay with them for 24 hours.  NO VISITORS WILL BE ALLOWED IN PRE-OP WHERE PATIENTS GET READY FOR SURGERY.  ONLY 1 SUPPORT PERSON MAY BE PRESENT WHILE YOU ARE IN SURGERY.  IF YOU ARE TO BE ADMITTED, ONCE YOU ARE IN YOUR ROOM YOU WILL BE ALLOWED TWO (2) VISITORS.  Minor children may have two parents present. Special consideration for safety and communication needs will be reviewed on a case by case basis.  Special instructions:    Oral Hygiene is also important to reduce your risk of infection.  Remember - BRUSH YOUR TEETH THE MORNING OF SURGERY WITH YOUR REGULAR TOOTHPASTE   [facility]- Preparing For Surgery  Before surgery, you can play an important role. Because skin is not sterile, your skin needs to be as free of  germs as possible. You can reduce the number of germs on your skin by washing with CHG (chlorahexidine gluconate) Soap before surgery.  CHG is an antiseptic cleaner which kills germs and bonds with the skin to continue killing germs even after washing.     Please do not use if you have an allergy to CHG or antibacterial soaps. If your skin becomes reddened/irritated stop using the CHG.  Do not shave (including legs and underarms) for at least 48 hours prior to first CHG shower. It is OK to shave your  face.  Please follow these instructions carefully.     Shower the NIGHT BEFORE SURGERY and the MORNING OF SURGERY with CHG Soap.   If you chose to wash your hair, wash your hair first as usual with your normal shampoo. After you shampoo, rinse your hair and body thoroughly to remove the shampoo.  Then [employer] and genitals (private parts) with your normal soap and rinse thoroughly to remove soap.  After that Use CHG Soap as you would any other liquid soap. You can apply CHG directly to the skin and wash gently with a scrungie or a clean washcloth.   Apply the CHG Soap to your body ONLY FROM THE NECK DOWN.  Do not use on open wounds or open sores. Avoid contact with your eyes, ears, mouth and genitals (private parts). Wash Face and genitals (private parts)  with your normal soap.   Wash thoroughly, paying special attention to the area where your surgery will be performed.  Thoroughly rinse your body with warm water from the neck down.  DO NOT shower/wash with your normal soap after using and rinsing off the CHG Soap.  Pat yourself dry with a CLEAN TOWEL.  Wear CLEAN PAJAMAS to bed the night before surgery  Place CLEAN SHEETS on your bed the night before your surgery  DO NOT SLEEP WITH PETS.   Day of Surgery:  Take a shower with CHG soap. Wear Clean/Comfortable clothing the morning of surgery Do not apply any deodorants/lotions.   Remember to brush your teeth WITH YOUR REGULAR TOOTHPASTE.   Please read over the following fact sheets that you were given.

## 2021-02-15 NOTE — Patient Instructions (Addendum)
Medication Instructions:  Your physician has recommended you make the following change in your medication:   CHANGE Amiodarone to one '200mg'$  tablet daily  If your heart rate gets down to 50-60bpm change back to alternate '100mg'$  and '200mg'$  every other day. Call our office if this happens as it likely means your heart is back in normal rhythm.  *If you need a refill on your cardiac medications before your next appointment, please call your pharmacy*   Lab Work: Your physician recommends that you return for lab work today: TSH, magnesium, BMP, CBC  If you have labs (blood work) drawn today and your tests are completely normal, you will receive your results only by: [place] (if you have MyChart) OR A paper copy in the mail If you have any lab test that is abnormal or we need to change your treatment, we will call you to review the results.   Testing/Procedures: Your EKG today shows rate controlled atrial fibrillation  Your physician has requested that you have an echocardiogram. Echocardiography is a painless test that uses sound waves to create images of your heart. It provides your doctor with information about the size and shape of your heart and how well your heart's chambers and valves are working. This procedure takes approximately one hour. There are no restrictions for this procedure.   Follow-Up: At [facility], you and your health needs are our priority.  As part of our continuing mission to provide you with exceptional heart care, we have created designated Provider Care Teams.  These Care Teams include your primary Cardiologist (physician) and Advanced Practice Providers (APPs -  Physician Assistants and Nurse Practitioners) who all work together to provide you with the care you need, when you need it.  We recommend signing up for the patient portal called "MyChart".  Sign up information is provided on this After Visit Summary.  MyChart is used to connect with patients for  Virtual Visits (Telemedicine).  Patients are able to view lab/test results, encounter notes, upcoming appointments, etc.  Non-urgent messages can be sent to your provider as well.   To learn more about what you can do with MyChart, go to [email or web address].    Your next appointment:   As scheduled with Dr. Claiborne Billings  *If you do not return to normal sinus rhythm with medication changes we will discuss additional follow up when we call your results for echocardiogram*   Other Instructions  Heart Healthy Diet Recommendations: A low-salt diet is recommended. Meats should be grilled, baked, or boiled. Avoid fried foods. Focus on lean protein sources like fish or chicken with vegetables and fruits. The American Heart Association is a Microbiologist!    Exercise recommendations: The American Heart Association recommends 150 minutes of moderate intensity exercise weekly. Try 30 minutes of moderate intensity exercise 4-5 times per week. This could include walking, jogging, or swimming.

## 2021-02-16 ENCOUNTER — Inpatient Hospital Stay (HOSPITAL_COMMUNITY)
Admission: RE | Admit: 2021-02-16 | Discharge: 2021-02-16 | Disposition: A | Discharge status: Home / Self Care | Payer: Medicare Other | Source: Ambulatory Visit

## 2021-02-16 LAB — CBC
Hematocrit: 37.1 % — ABNORMAL LOW (ref 37.5–51.0)
Hemoglobin: 13.5 g/dL (ref 13.0–17.7)
MCH: 36.2 pg — ABNORMAL HIGH (ref 26.6–33.0)
MCHC: 36.4 g/dL — ABNORMAL HIGH (ref 31.5–35.7)
MCV: 100 fL — ABNORMAL HIGH (ref 79–97)
Platelets: 160 10*3/uL (ref 150–450)
RBC: 3.73 x10E6/uL — ABNORMAL LOW (ref 4.14–5.80)
RDW: 11.5 % — ABNORMAL LOW (ref 11.6–15.4)
WBC: 4.8 10*3/uL (ref 3.4–10.8)

## 2021-02-16 LAB — BASIC METABOLIC PANEL
BUN/Creatinine Ratio: 16 (ref 10–24)
BUN: 22 mg/dL (ref 8–27)
CO2: 24 mmol/L (ref 20–29)
Calcium: 8.7 mg/dL (ref 8.6–10.2)
Chloride: 100 mmol/L (ref 96–106)
Creatinine, Ser: 1.4 mg/dL — ABNORMAL HIGH (ref 0.76–1.27)
Glucose: 110 mg/dL — ABNORMAL HIGH (ref 65–99)
Potassium: 5.2 mmol/L (ref 3.5–5.2)
Sodium: 138 mmol/L (ref 134–144)
eGFR: 51 mL/min/{1.73_m2} — ABNORMAL LOW (ref >59)

## 2021-02-16 LAB — MAGNESIUM: Magnesium: 2.4 mg/dL — ABNORMAL HIGH (ref 1.6–2.3)

## 2021-02-17 ENCOUNTER — Encounter (HOSPITAL_BASED_OUTPATIENT_CLINIC_OR_DEPARTMENT_OTHER): Payer: Self-pay | Admitting: Family

## 2021-02-20 ENCOUNTER — Ambulatory Visit (HOSPITAL_COMMUNITY): Admission: RE | Admit: 2021-02-20 | Payer: Medicare Other | Source: Home / Self Care | Admitting: Surgery

## 2021-02-20 ENCOUNTER — Encounter (HOSPITAL_COMMUNITY): Admission: RE | Payer: Self-pay | Source: Home / Self Care

## 2021-02-20 SURGERY — REPAIR, HERNIA, INGUINAL, ADULT
Anesthesia: General

## 2021-02-28 ENCOUNTER — Other Ambulatory Visit (HOSPITAL_BASED_OUTPATIENT_CLINIC_OR_DEPARTMENT_OTHER): Payer: Medicare Other

## 2021-03-20 NOTE — Telephone Encounter (Signed)
Remo Lipps is calling stating that this surgery was canceled due to [facility]. They are now trying to reschedule, but need the clearance from our office again before doing so.

## 2021-03-21 NOTE — Telephone Encounter (Signed)
   Primary Cardiologist: Shelva Majestic, MD  Chart reviewed as part of pre-operative protocol coverage. Given past medical history and time since last visit, based on ACC/AHA guidelines, AUGUST LONGEST would be at acceptable risk for the planned procedure without further cardiovascular testing.   Patient with diagnosis of afib on Xarelto for anticoagulation.     Procedure: Hernia Surgery Date of procedure: TBD   CHA2DS2-VASc Score = 3   This indicates a 3.2% annual risk of stroke. The patient's score is based upon: CHF History: 0 HTN History: 1 Diabetes History: 0 Stroke History: 0 Vascular Disease History: 0 Age Score: 2 Gender Score: 0      CrCl 34.9 ml/min   Per office protocol, patient can hold Xarelto for 2 days prior to procedure.  The patient was advised that if he develops new symptoms prior to surgery to contact our office to arrange for a follow-up visit, and he verbalized understanding.  I will route this recommendation to the requesting party via Epic fax function and remove from pre-op pool.  Please call with questions.  Tanner Ho. Winfred Redel NP-C    03/21/2021, 7:09 AM [address] Office [phone] Fax [phone]

## 2021-03-22 NOTE — Telephone Encounter (Signed)
I contacted the patient and his wife to let them know that their preoperative cardiac evaluation form has been sent back to the requesting office.  They had no further questions at the time and appreciated the call.  Jossie Ng. Dylann Layne NP-C    03/22/2021, 11:57 AM [address] Office [phone] Fax [phone]

## 2021-03-22 NOTE — Telephone Encounter (Signed)
Patient's wife is calling back due to not receiving a callback about this clearance yet.

## 2021-04-04 ENCOUNTER — Encounter (INDEPENDENT_AMBULATORY_CARE_PROVIDER_SITE_OTHER): Payer: Self-pay | Admitting: Ophthalmology

## 2021-04-04 ENCOUNTER — Ambulatory Visit (INDEPENDENT_AMBULATORY_CARE_PROVIDER_SITE_OTHER): Payer: Medicare Other | Admitting: Ophthalmology

## 2021-04-04 ENCOUNTER — Other Ambulatory Visit: Payer: Self-pay

## 2021-04-04 DIAGNOSIS — H353134 Nonexudative age-related macular degeneration, bilateral, advanced atrophic with subfoveal involvement: Secondary | ICD-10-CM

## 2021-04-04 DIAGNOSIS — H33001 Unspecified retinal detachment with retinal break, right eye: Secondary | ICD-10-CM

## 2021-04-04 DIAGNOSIS — H43812 Vitreous degeneration, left eye: Secondary | ICD-10-CM

## 2021-04-04 NOTE — Assessment & Plan Note (Signed)
OS physiologic no retinal holes or tears

## 2021-04-04 NOTE — Assessment & Plan Note (Signed)
Extensive geographic atrophy OU no real progression.  Accounts for acuity

## 2021-04-04 NOTE — Assessment & Plan Note (Signed)
Now some 8 months post surgical repair via vitrectomy temporary Perfluoron, injection of C3F8 gas

## 2021-04-04 NOTE — Progress Notes (Signed)
04/04/2021     CHIEF COMPLAINT Patient presents for  Chief Complaint  Patient presents with   Retina Follow Up    8 Wk F/U OD  Pt sts OD is "great." Pt sts bubble is gone OD. Pt is off all RX drops. Pt reports occasional scratchy sensation OD.       HISTORY OF PRESENT ILLNESS: Tanner Ho is a 80 y.o. male who presents to the clinic today for:   HPI     Retina Follow Up   Patient presents with  Other.  In right eye.  This started 4 weeks ago.  Severity is moderate.  Duration of 4 weeks.  Since onset it is gradually improving. Additional comments: 8 Wk F/U OD  Pt sts OD is "great." Pt sts bubble is gone OD. Pt is off all RX drops. Pt reports occasional scratchy sensation OD.         Comments   4 mos fu OU oct fp. Patient states vision is stable and seems to be improved. Denies any new floaters or FOL. Pt states "I have floaters I have had for a long time, they are better now though."       Last edited by Laurin Coder on 04/04/2021 10:18 AM.      Referring physician: [name], MD [address],  [place] [id number]  HISTORICAL INFORMATION:   Selected notes from the MEDICAL RECORD NUMBER       CURRENT MEDICATIONS: Current Outpatient Medications (Ophthalmic Drugs)  Medication Sig   loteprednol (LOTEMAX) 0.5 % ophthalmic suspension Place 1 drop into the right eye daily.   Polyethyl Glycol-Propyl Glycol 0.4-0.3 % SOLN Place 2 drops into both eyes 2 (two) times daily as needed (for dry eyes).   No current facility-administered medications for this visit. (Ophthalmic Drugs)   Current Outpatient Medications (Other)  Medication Sig   acetaminophen (TYLENOL) 325 MG tablet Take 650 mg by mouth every 6 (six) hours as needed for moderate pain.   acetaminophen (TYLENOL) 650 MG CR tablet Take 650 mg by mouth at bedtime as needed for pain.   alfuzosin (UROXATRAL) 10 MG 24 hr tablet Take 10 mg by mouth daily.   amiodarone (PACERONE) 200 MG  tablet Take 1 tablet (200 mg total) by mouth daily.   ascorbic acid (VITAMIN C) 1000 MG tablet Take 1,000 mg by mouth daily.   chlorthalidone (HYGROTON) 25 MG tablet TAKE 1/2 TABLET (12.5 MG TOTAL) BY MOUTH 2 (TWO) TIMES A WEEK.   Cholecalciferol (VITAMIN D) 50 MCG (2000 UT) tablet Take 2,000 Units by mouth daily.   docusate sodium (COLACE) 100 MG capsule Take 100 mg by mouth 2 (two) times daily.   finasteride (PROSCAR) 5 MG tablet Take 5 mg by mouth daily.   hydrocortisone 2.5 % cream Apply 1 application topically as needed (itching).   ketoconazole (NIZORAL) 2 % cream Apply 1 application topically daily as needed for irritation.   levothyroxine (SYNTHROID) 75 MCG tablet Take 75 mcg by mouth daily.    linaclotide (LINZESS) 145 MCG CAPS capsule Take 1 capsule (145 mcg total) by mouth as needed.   loratadine (CLARITIN) 10 MG tablet Take 10 mg by mouth daily.   losartan (COZAAR) 25 MG tablet TAKE 1 TABLET BY MOUTH EVERY DAY   melatonin 3 MG TABS tablet Take 3 mg by mouth at bedtime.   metoprolol tartrate (LOPRESSOR) 25 MG tablet Take 0.5 tablets (12.5 mg total) by mouth 2 (two) times daily  as needed (afib).   Multiple Vitamins-Minerals (PRESERVISION AREDS) CAPS Take 1 capsule by mouth 2 (two) times daily.   rosuvastatin (CRESTOR) 5 MG tablet TAKE 1 TABLET BY MOUTH DAILY AT 6 PM.   sodium chloride (OCEAN) 0.65 % SOLN nasal spray Place 1 spray into both nostrils as needed for congestion.   vitamin B-12 (CYANOCOBALAMIN) 1000 MCG tablet Take 1,000 mcg by mouth daily.   XARELTO 15 MG TABS tablet TAKE 1 TABLET (15 MG TOTAL) BY MOUTH DAILY WITH SUPPER.   No current facility-administered medications for this visit. (Other)      REVIEW OF SYSTEMS:    ALLERGIES Allergies  Allergen Reactions   Ciprofloxacin Other (See Comments)    Cannot take due to currently taking Amiodarone   Hytrin [Terazosin] Other (See Comments)    Syncopal event     PAST MEDICAL HISTORY Past Medical History:   Diagnosis Date   Adrenal mass ([place])    Asthma    BPH (benign prostatic hyperplasia)    CKD (chronic kidney disease), stage III (HCC)    GERD (gastroesophageal reflux disease)    H/O cardiovascular stress test 03/2012   EKG with 1-2 mm ST segment depression but normal perfusion images. Followup metastases test reduced exercise effort and functional status and determinate for myocardial dysfunction   History of cardiac monitoring    a. 2015: cardiac monitor revealed episodes of PACs, bradycardia, with heart rates down to 48, 4 beat PSVT, 6 beat run of NSVT at a rate of 131. There was no recurrent afib.    Hyperlipidemia LDL goal < 100    Hypertension    Hypothyroidism    Macular degeneration    MVP (mitral valve prolapse)    on cardiac cath - not mentioned on 2014 echo.   NSVT (nonsustained ventricular tachycardia)    PAF (paroxysmal atrial fibrillation) ([place])    last episode 03/2012   Pericarditis    Posterior vitreous detachment of right eye 10/26/2019   Premature atrial contractions    Sinus bradycardia    Subconjunctival hemorrhage of left eye 10/26/2019   The condition of the involved eye is that of a subconjunctival hemorrhage.  These are often spontaneous but are also often at or near the site of low location of an injection should to be placed into the eye.  In the absence of direct blunt or severe trauma these will typically resolve on their own spontaneously and have no impact on the vision.    These are very similar to having a bruise in your   Past Surgical History:  Procedure Laterality Date   ABDOMINAL VASCULAR ULTRASOUND EVALUATION  12/20/2008   mild non-obstructive plaque, abdominal aorta, with mild calcification at the bifurcation-no obstruction, no evidence for aneurysm of the abdominal aorta   CARDIAC CATHETERIZATION  01/04/1999   hyperdynamic LV function, probable angiographic mitral valve prolapse, systolic muscle bridging of the mid LAD without obstructive disease,  systolic narrowing up to 13%   Cardiopulmonary MET-test  07/20/2012   reduced exercise effort and functional status, peak max O2 comsumption was only 58% of predicted, cardiovascular status was interpreted as indeterminate for myocardial dysfunction due to the suboptimal peak cardiovascular stress load.    FAMILY HISTORY Family History  Problem Relation Age of Onset   Stroke Mother    Stroke Paternal Grandfather     SOCIAL HISTORY Social History   Tobacco Use   Smoking status: Former   Smokeless tobacco: Never  Scientific laboratory technician Use: Never  used  Substance Use Topics   Alcohol use: No   Drug use: No         OPHTHALMIC EXAM:  Base Eye Exam     Visual Acuity (ETDRS)       Right Left   Dist [place] 20/400 20/200 -1   Dist ph [place] NI NI         Tonometry (Tonopen, 10:21 AM)       Right Left   Pressure 8 9         Pupils       Pupils Dark Light Shape React APD   Right PERRL 5 5 Round Minimal None   Left PERRL 4 3 Round Brisk None         Extraocular Movement       Right Left    Full Full         Neuro/Psych     Oriented x3: Yes   Mood/Affect: Normal         Dilation     Both eyes: 1.0% Mydriacyl, 2.5% Phenylephrine @ 10:21 AM           Slit Lamp and Fundus Exam     External Exam       Right Left   External Normal          Slit Lamp Exam       Right Left   Lids/Lashes Normal Normal   Conjunctiva/Sclera 1+ Injection, typical post surgical appearance Subconjunctival hemorrhage, old minor and clearing   Cornea Clear Clear   Anterior Chamber Deep and quiet Deep and quiet   Iris Round and reactive Round and reactive   Lens Posterior chamber intraocular lens Posterior chamber intraocular lens   Anterior Vitreous Clear vitrectomized,  Normal         Fundus Exam       Right Left   Posterior Vitreous Clear, vitrectomized,  Posterior vitreous detachment   Disc Normal Normal   C/D Ratio 0.0 0.0   Macula Advanced age related  macular degeneration, Geographic atrophy 4 disc areas in size confluent, no hemorrhage, no macular thickening Advanced age related macular degeneration, Geographic atrophy 20-22 disc areas in size confluent   Vessels Normal Normal   Periphery Retina attached, good laser retinopexy, temporal and inferorly Normal            IMAGING AND PROCEDURES  Imaging and Procedures for 04/04/21  OCT, Retina - OU - Both Eyes       Right Eye Quality was borderline. Central Foveal Thickness: 22. Progression has been stable. Findings include central retinal atrophy, outer retinal atrophy, inner retinal atrophy.   Left Eye Quality was borderline. Scan locations included subfoveal. Progression has been stable. Findings include central retinal atrophy, outer retinal atrophy, inner retinal atrophy.   Notes Bilateral diffuse geographic confluent RPE atrophy.  No signs of CNVM OU.     Color Fundus Photography Optos - OU - Both Eyes       Right Eye Progression has improved. Disc findings include normal observations. Macula : geographic atrophy. Vessels : normal observations.   Left Eye Progression has been stable. Disc findings include normal observations. Macula : geographic atrophy. Vessels : normal observations. Periphery : normal observations.   Notes Good retinopexy temporally and inferiorly at the sites of subretinal fluid drainage OD, a attached, good retinopexy.    Retina nicely attached.  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 Nonexudative age-related macular degeneration, bilateral, advanced atrophic with subfoveal involvement  H35.3134     4. Posterior vitreous detachment of left eye  H43.812       OU stable overall.  2.  OD history of macula off RD rhegmatogenous now status post repair some 8 months looks great  3.  Ophthalmic Meds Ordered this visit:  No orders of the defined types were placed in this encounter.      Return in about 1 year (around 04/04/2022) for DILATE OU, COLOR FP, OCT.  There are no Patient Instructions on file for this visit.   Explained the diagnoses, plan, and follow up with the patient and they expressed understanding.  Patient expressed understanding of the importance of proper follow up care.   Clent Demark Arilyn Brierley M.D. Diseases & Surgery of the Retina and Vitreous [facility] [place] 04/04/21     Abbreviations: M myopia (nearsighted); A astigmatism; H hyperopia (farsighted); P presbyopia; Mrx spectacle prescription;  CTL contact lenses; OD right eye; OS left eye; OU both eyes  XT exotropia; ET esotropia; PEK punctate epithelial keratitis; PEE punctate epithelial erosions; DES dry eye syndrome; MGD meibomian gland dysfunction; ATs artificial tears; PFAT's preservative free artificial tears; [place] nuclear sclerotic cataract; PSC posterior subcapsular cataract; ERM epi-retinal membrane; PVD posterior vitreous detachment; RD retinal detachment; DM diabetes mellitus; DR diabetic retinopathy; NPDR non-proliferative diabetic retinopathy; PDR proliferative diabetic retinopathy; CSME clinically significant macular edema; DME diabetic macular edema; dbh dot blot hemorrhages;  CWS cotton wool spot; POAG primary open angle glaucoma; C/D cup-to-disc ratio; HVF humphrey visual field; GVF goldmann visual field; OCT optical coherence tomography; IOP intraocular pressure; BRVO Branch retinal vein occlusion; CRVO central retinal vein occlusion; CRAO central retinal artery occlusion; BRAO branch retinal artery occlusion; RT retinal tear; SB scleral buckle; PPV pars plana vitrectomy; VH Vitreous hemorrhage; PRP panretinal laser photocoagulation; IVK intravitreal kenalog; VMT vitreomacular traction; MH Macular hole;  NVD neovascularization of the disc; NVE neovascularization elsewhere; AREDS age related eye disease study; ARMD age related macular degeneration; POAG primary open angle glaucoma; EBMD epithelial/anterior basement membrane dystrophy; ACIOL anterior chamber intraocular lens; IOL intraocular lens; PCIOL posterior chamber intraocular lens; Phaco/IOL phacoemulsification with intraocular lens placement; [place] photorefractive keratectomy; LASIK laser assisted in situ keratomileusis; HTN hypertension; DM diabetes mellitus; COPD chronic obstructive pulmonary disease

## 2021-04-12 NOTE — Pre-Procedure Instructions (Signed)
Surgical Instructions    Your procedure is scheduled on Tuesday 04/17/21.   Report to [facility] Main Entrance "A" at 05:30 A.M., then check in with the Admitting office.  Call this number if you have problems the morning of surgery:  [phone]   If you have any questions prior to your surgery date call [phone]: Open Monday-Friday 8am-4pm    Remember:  Do not eat after midnight the night before your surgery  You may drink clear liquids until 04:30 A.M. the morning of your surgery.   Clear liquids allowed are: Water, Non-Citrus Juices (without pulp), Carbonated Beverages, Clear Tea, Black Coffee ONLY (NO MILK, CREAM OR POWDERED CREAMER of any kind), and Gatorade    Take these medicines the morning of surgery with A SIP OF WATER   alfuzosin (UROXATRAL)  amiodarone (PACERONE)  docusate sodium (COLACE)  finasteride (PROSCAR)   levothyroxine (SYNTHROID)  loratadine (CLARITIN)    Take these medicines if needed:   acetaminophen (TYLENOL)  metoprolol tartrate (LOPRESSOR)   Polyethyl Glycol-Propyl Glycol   sodium chloride (OCEAN)   Please follow your surgeon' instructions regarding XARELTO. If you have not received instructions then you need to contact your surgeon's office for instructions.   As of today, STOP taking any Aspirin (unless otherwise instructed by your surgeon) Aleve, Naproxen, Ibuprofen, Motrin, Advil, Goody's, BC's, all herbal medications, fish oil, and all vitamins.     After your COVID test   You are not required to quarantine however you are required to wear a well-fitting mask when you are out and around people not in your household.  If your mask becomes wet or soiled, replace with a new one.  Wash your hands often with soap and water for 20 seconds or clean your hands with an alcohol-based hand sanitizer that contains at least 60% alcohol.  Do not share personal items.  Notify your provider: if you are in close contact with someone who has COVID   or if you develop a fever of 100.4 or greater, sneezing, cough, sore throat, shortness of breath or body aches.             Do not wear jewelry or makeup Do not wear lotions, powders, perfumes/colognes, or deodorant. Do not shave 48 hours prior to surgery.  Men may shave face and neck. Do not bring valuables to the hospital. DO Not wear nail polish, gel polish, artificial nails, or any other type of covering on natural nails including finger and toenails. If patients have artificial nails, gel coating, etc. that need to be removed by a nail salon, please have this removed prior to surgery or surgery may need to be canceled/delayed if the surgeon/ anesthesia feels like the patient is unable to be adequately monitored.             [facility] is not responsible for any belongings or valuables.  Do NOT Smoke (Tobacco/Vaping)  24 hours prior to your procedure  If you use a CPAP at night, you may bring your mask for your overnight stay.   Contacts, glasses, hearing aids, dentures or partials may not be worn into surgery, please bring cases for these belongings   For patients admitted to the hospital, discharge time will be determined by your treatment team.   Patients discharged the day of surgery will not be allowed to drive home, and someone needs to stay with them for 24 hours.  NO VISITORS WILL BE ALLOWED IN PRE-OP WHERE PATIENTS ARE PREPPED FOR SURGERY.  ONLY 1 SUPPORT PERSON MAY BE PRESENT IN THE WAITING ROOM WHILE YOU ARE IN SURGERY.  IF YOU ARE TO BE ADMITTED, ONCE YOU ARE IN YOUR ROOM YOU WILL BE ALLOWED TWO (2) VISITORS. 1 (ONE) VISITOR MAY STAY OVERNIGHT BUT MUST ARRIVE TO THE ROOM BY 8pm.  Minor children may have two parents present. Special consideration for safety and communication needs will be reviewed on a case by case basis.  Special instructions:    Oral Hygiene is also important to reduce your risk of infection.  Remember - BRUSH YOUR TEETH THE MORNING OF SURGERY WITH  YOUR REGULAR TOOTHPASTE   [place] [facility]- Preparing For Surgery  Before surgery, you can play an important role. Because skin is not sterile, your skin needs to be as free of germs as possible. You can reduce the number of germs on your skin by washing with CHG (chlorahexidine gluconate) Soap before surgery.  CHG is an antiseptic cleaner which kills germs and bonds with the skin to continue killing germs even after washing.     Please do not use if you have an allergy to CHG or antibacterial soaps. If your skin becomes reddened/irritated stop using the CHG.  Do not shave (including legs and underarms) for at least 48 hours prior to first CHG shower. It is OK to shave your face.  Please follow these instructions carefully.     Shower the NIGHT BEFORE SURGERY and the MORNING OF SURGERY with CHG Soap.   If you chose to wash your hair, wash your hair first as usual with your normal shampoo. After you shampoo, rinse your hair and body thoroughly to remove the shampoo.  Then [employer] and genitals (private parts) with your normal soap and rinse thoroughly to remove soap.  After that Use CHG Soap as you would any other liquid soap. You can apply CHG directly to the skin and wash gently with a scrungie or a clean washcloth.   Apply the CHG Soap to your body ONLY FROM THE NECK DOWN.  Do not use on open wounds or open sores. Avoid contact with your eyes, ears, mouth and genitals (private parts). Wash Face and genitals (private parts)  with your normal soap.   Wash thoroughly, paying special attention to the area where your surgery will be performed.  Thoroughly rinse your body with warm water from the neck down.  DO NOT shower/wash with your normal soap after using and rinsing off the CHG Soap.  Pat yourself dry with a CLEAN TOWEL.  Wear CLEAN PAJAMAS to bed the night before surgery  Place CLEAN SHEETS on your bed the night before your surgery  DO NOT SLEEP WITH PETS.   Day of Surgery:  Take  a shower with CHG soap. Wear Clean/Comfortable clothing the morning of surgery Do not apply any deodorants/lotions.   Remember to brush your teeth WITH YOUR REGULAR TOOTHPASTE.   Please read over the following fact sheets that you were given.

## 2021-04-13 ENCOUNTER — Encounter (HOSPITAL_COMMUNITY): Payer: Self-pay

## 2021-04-13 ENCOUNTER — Encounter (HOSPITAL_COMMUNITY)
Admission: RE | Admit: 2021-04-13 | Discharge: 2021-04-13 | Disposition: A | Discharge status: Home / Self Care | Payer: Medicare Other | Source: Ambulatory Visit | Attending: Surgery | Admitting: Surgery

## 2021-04-13 ENCOUNTER — Other Ambulatory Visit: Payer: Self-pay

## 2021-04-13 ENCOUNTER — Ambulatory Visit (INDEPENDENT_AMBULATORY_CARE_PROVIDER_SITE_OTHER): Payer: Medicare Other | Admitting: Cardiovascular Disease

## 2021-04-13 ENCOUNTER — Encounter: Payer: Self-pay | Admitting: Cardiovascular Disease

## 2021-04-13 VITALS — BP 153/70 | HR 73 | Temp 98.2°F | Resp 18 | Ht 69.0 in | Wt 160.0 lb

## 2021-04-13 DIAGNOSIS — Z01812 Encounter for preprocedural laboratory examination: Secondary | ICD-10-CM | POA: Diagnosis not present

## 2021-04-13 DIAGNOSIS — N183 Chronic kidney disease, stage 3 unspecified: Secondary | ICD-10-CM | POA: Diagnosis not present

## 2021-04-13 DIAGNOSIS — I4819 Other persistent atrial fibrillation: Secondary | ICD-10-CM

## 2021-04-13 DIAGNOSIS — I129 Hypertensive chronic kidney disease with stage 1 through stage 4 chronic kidney disease, or unspecified chronic kidney disease: Secondary | ICD-10-CM | POA: Insufficient documentation

## 2021-04-13 DIAGNOSIS — I480 Paroxysmal atrial fibrillation: Secondary | ICD-10-CM | POA: Diagnosis not present

## 2021-04-13 DIAGNOSIS — Z7901 Long term (current) use of anticoagulants: Secondary | ICD-10-CM | POA: Insufficient documentation

## 2021-04-13 DIAGNOSIS — Z01818 Encounter for other preprocedural examination: Secondary | ICD-10-CM | POA: Diagnosis not present

## 2021-04-13 DIAGNOSIS — I341 Nonrheumatic mitral (valve) prolapse: Secondary | ICD-10-CM | POA: Diagnosis not present

## 2021-04-13 DIAGNOSIS — I10 Essential (primary) hypertension: Secondary | ICD-10-CM

## 2021-04-13 DIAGNOSIS — E785 Hyperlipidemia, unspecified: Secondary | ICD-10-CM | POA: Diagnosis not present

## 2021-04-13 DIAGNOSIS — E039 Hypothyroidism, unspecified: Secondary | ICD-10-CM | POA: Diagnosis not present

## 2021-04-13 DIAGNOSIS — Z20822 Contact with and (suspected) exposure to covid-19: Secondary | ICD-10-CM | POA: Diagnosis not present

## 2021-04-13 HISTORY — DX: Unspecified osteoarthritis, unspecified site: M19.90

## 2021-04-13 HISTORY — DX: Malignant (primary) neoplasm, unspecified: C80.1

## 2021-04-13 LAB — CBC
HCT: 38 % — ABNORMAL LOW (ref 39.0–52.0)
Hemoglobin: 12.7 g/dL — ABNORMAL LOW (ref 13.0–17.0)
MCH: 35.9 pg — ABNORMAL HIGH (ref 26.0–34.0)
MCHC: 33.4 g/dL (ref 30.0–36.0)
MCV: 107.3 fL — ABNORMAL HIGH (ref 80.0–100.0)
Platelets: 165 10*3/uL (ref 150–400)
RBC: 3.54 MIL/uL — ABNORMAL LOW (ref 4.22–5.81)
RDW: 12.8 % (ref 11.5–15.5)
WBC: 4.6 10*3/uL (ref 4.0–10.5)
nRBC: 0 % (ref 0.0–0.2)

## 2021-04-13 LAB — BASIC METABOLIC PANEL
Anion gap: 9 (ref 5–15)
BUN: 32 mg/dL — ABNORMAL HIGH (ref 8–23)
CO2: 25 mmol/L (ref 22–32)
Calcium: 8.8 mg/dL — ABNORMAL LOW (ref 8.9–10.3)
Chloride: 104 mmol/L (ref 98–111)
Creatinine, Ser: 1.55 mg/dL — ABNORMAL HIGH (ref 0.61–1.24)
GFR, Estimated: 45 mL/min — ABNORMAL LOW (ref >60)
Glucose, Bld: 99 mg/dL (ref 70–99)
Potassium: 4 mmol/L (ref 3.5–5.1)
Sodium: 138 mmol/L (ref 135–145)

## 2021-04-13 LAB — SARS CORONAVIRUS 2 (TAT 6-24 HRS): SARS Coronavirus 2: NEGATIVE

## 2021-04-13 MED ORDER — CHLORTHALIDONE 25 MG PO TABS: 12.5000 mg | ORAL_TABLET | ORAL | 1 refills | Status: DC

## 2021-04-13 NOTE — Patient Instructions (Addendum)
Medication Instructions:  Take chlorthalidone 12.5 mg (1/2 tablet) for two days. Then take 12.5 mg (1/2 tablet) every other day. Stop taking xarelto on Sunday and Monday. Follow the surgeon's direction on resuming the xarelto after your procedure.  *If you need a refill on your cardiac medications before your next appointment, please call your pharmacy*   Lab Work: None. If you have labs (blood work) drawn today and your tests are completely normal, you will receive your results only by: [place] (if you have MyChart) OR A paper copy in the mail If you have any lab test that is abnormal or we need to change your treatment, we will call you to review the results.   Testing/Procedures: None.   Follow-Up: At [facility], you and your health needs are our priority.  As part of our continuing mission to provide you with exceptional heart care, we have created designated Provider Care Teams.  These Care Teams include your primary Cardiologist (physician) and Advanced Practice Providers (APPs -  Physician Assistants and Nurse Practitioners) who all work together to provide you with the care you need, when you need it.  We recommend signing up for the patient portal called "MyChart".  Sign up information is provided on this After Visit Summary.  MyChart is used to connect with patients for Virtual Visits (Telemedicine).  Patients are able to view lab/test results, encounter notes, upcoming appointments, etc.  Non-urgent messages can be sent to your provider as well.   To learn more about what you can do with MyChart, go to [email or web address].    Your next appointment:   6 month(s)  The format for your next appointment:   In Person  Provider:   Shelva Majestic, MD

## 2021-04-13 NOTE — Progress Notes (Signed)
PCP - [name] Cardiologist - Shelva Majestic Pt stated he has routine follow up this afternoon (04/13/21) with Dr. Claiborne Billings  Chest x-ray - n/a EKG - 02/15/21 Stress Test - 2013 ECHO - 09/04/2016 Cardiac Cath - 01/04/1999   Blood Thinner Instructions: Stop Xarelto 2 days prior to procedure, per patient   ERAS Protcol - yes, no drink ordered or given   COVID TEST- 04/13/21 (outpatient in bed)   Anesthesia review: yes, heart history Received cardiac clearance on 03/21/21  Patient denies shortness of breath, fever, cough and chest pain at PAT appointment   All instructions explained to the patient, with a verbal understanding of the material. Patient agrees to go over the instructions while at home for a better understanding. Patient also instructed to self quarantine after being tested for COVID-19. The opportunity to ask questions was provided.

## 2021-04-13 NOTE — Pre-Procedure Instructions (Signed)
Surgical Instructions    Your procedure is scheduled on Tuesday 04/17/21.   Report to [facility] Main Entrance "A" at 05:30 A.M., then check in with the Admitting office.  Call this number if you have problems the morning of surgery:  [phone]   If you have any questions prior to your surgery date call [phone]: Open Monday-Friday 8am-4pm    Remember:  Do not eat after midnight the night before your surgery  You may drink clear liquids until 04:30 A.M. the morning of your surgery.   Clear liquids allowed are: Water, Non-Citrus Juices (without pulp), Carbonated Beverages, Clear Tea, Black Coffee ONLY (NO MILK, CREAM OR POWDERED CREAMER of any kind), and Gatorade    Take these medicines the morning of surgery with A SIP OF WATER   alfuzosin (UROXATRAL)  amiodarone (PACERONE)  docusate sodium (COLACE)  finasteride (PROSCAR)   levothyroxine (SYNTHROID)  loratadine (CLARITIN)    Take these medicines if needed:   acetaminophen (TYLENOL)  metoprolol tartrate (LOPRESSOR)   Polyethyl Glycol-Propyl Glycol   sodium chloride (OCEAN)   Please follow your surgeon' instructions regarding XARELTO. If you have not received instructions then you need to contact your surgeon's office for instructions.   As of today, STOP taking any Aspirin (unless otherwise instructed by your surgeon) Aleve, Naproxen, Ibuprofen, Motrin, Advil, Goody's, BC's, all herbal medications, fish oil, and all vitamins.   DAY OF SURGERY         Do not wear jewelry Do not wear lotions, powders, colognes, or deodorant. Men may shave face and neck. Do not bring valuables to the hospital.             [facility] is not responsible for any belongings or valuables.  Do NOT Smoke (Tobacco/Vaping)  24 hours prior to your procedure  If you use a CPAP at night, you may bring your mask for your overnight stay.   Contacts, glasses, hearing aids, dentures or partials may not be worn into surgery, please bring cases  for these belongings   For patients admitted to the hospital, discharge time will be determined by your treatment team.   Patients discharged the day of surgery will not be allowed to drive home, and someone needs to stay with them for 24 hours.  NO VISITORS WILL BE ALLOWED IN PRE-OP WHERE PATIENTS ARE PREPPED FOR SURGERY.  ONLY 1 SUPPORT PERSON MAY BE PRESENT IN THE WAITING ROOM WHILE YOU ARE IN SURGERY.  IF YOU ARE TO BE ADMITTED, ONCE YOU ARE IN YOUR ROOM YOU WILL BE ALLOWED TWO (2) VISITORS. 1 (ONE) VISITOR MAY STAY OVERNIGHT BUT MUST ARRIVE TO THE ROOM BY 8pm.  Minor children may have two parents present. Special consideration for safety and communication needs will be reviewed on a case by case basis.  Special instructions:    Oral Hygiene is also important to reduce your risk of infection.  Remember - BRUSH YOUR TEETH THE MORNING OF SURGERY WITH YOUR REGULAR TOOTHPASTE   [place] [facility]- Preparing For Surgery  Before surgery, you can play an important role. Because skin is not sterile, your skin needs to be as free of germs as possible. You can reduce the number of germs on your skin by washing with CHG (chlorahexidine gluconate) Soap before surgery.  CHG is an antiseptic cleaner which kills germs and bonds with the skin to continue killing germs even after washing.     Please do not use if you have an allergy to CHG or antibacterial soaps.  If your skin becomes reddened/irritated stop using the CHG.  Do not shave (including legs and underarms) for at least 48 hours prior to first CHG shower. It is OK to shave your face.  Please follow these instructions carefully.     Shower the NIGHT BEFORE SURGERY and the MORNING OF SURGERY with CHG Soap.   If you chose to wash your hair, wash your hair first as usual with your normal shampoo. After you shampoo, rinse your hair and body thoroughly to remove the shampoo.  Then [employer] and genitals (private parts) with your normal soap and rinse  thoroughly to remove soap.  After that Use CHG Soap as you would any other liquid soap. You can apply CHG directly to the skin and wash gently with a scrungie or a clean washcloth.   Apply the CHG Soap to your body ONLY FROM THE NECK DOWN.  Do not use on open wounds or open sores. Avoid contact with your eyes, ears, mouth and genitals (private parts). Wash Face and genitals (private parts)  with your normal soap.   Wash thoroughly, paying special attention to the area where your surgery will be performed.  Thoroughly rinse your body with warm water from the neck down.  DO NOT shower/wash with your normal soap after using and rinsing off the CHG Soap.  Pat yourself dry with a CLEAN TOWEL.  Wear CLEAN PAJAMAS to bed the night before surgery  Place CLEAN SHEETS on your bed the night before your surgery  DO NOT SLEEP WITH PETS.   Day of Surgery:  Take a shower with CHG soap. Wear Clean/Comfortable clothing the morning of surgery Do not apply any deodorants/lotions.   Remember to brush your teeth WITH YOUR REGULAR TOOTHPASTE.   Please read over the following fact sheets that you were given.

## 2021-04-13 NOTE — Progress Notes (Signed)
Patient ID: Tanner Ho, male   DOB: 04-03-1941, 80 y.o.   MRN: [id number]    Primary M.D.: Dr. [name]  HPI: Tanner Ho is a 80 y.o. male who presents to the office today for a 7  month follow-up cardiology evaluation.  Tanner Ho has a history of mitral valve prolapse, hypertension, hyperlipidemia, hypothyroidism, as well as paroxysmal atrial fibrillation. Tanner Ho is on chronic Coumadin anticoagulation. His last documented atrial fibrillation episode was in October 2013. A nuclear perfusion study in October 2013 revealed normal perfusion imaging but Tanner Ho developed 1-2 mm of ST segment depression at peak stress test and the possibility of microvascular etiology leading to his ECG abnormalities. A cardiopulmonary met test on 09/17/2012 demonstarated a reduced peak maximum oxygen consumption at 58%. Tanner Ho had a suboptimal peak cardiovascular stress load making cardiovascular status interpretation indeterminate. Tanner Ho did have mild ventilation/perfusion mismatch suggesting impaired ulnar circulation plus minus increased dead space. Tanner Ho had a blunted chronotropic response to exercise. The test was limited by leg fatigue. When I saw him in the office subsequently I reduced his Cardizem from 240 to180 mg. At times, Tanner Ho notes a rare palpitation. Tanner Ho denies any awareness of breakthrough atrial fibrillation.   Tanner Ho presented to the emergency room in November 2014 with some vague chest pain. In retrospect, Tanner Ho feels this may have been GERD symptoms. Cardiac enzymes were negative. ECG was without changes. A nuclear perfusion study on 04/22/2013 was low risk and showed mild diaphragmatic attenuation but did not show a region of scar or ischemia, unchanged from his prior study of several years previously.  In 2015 Tanner Ho had noticed some dizziness and decreased energy   A cardiac monitor revealed episodes of PACs, bradycardia, with heart rates down to 48, but Tanner Ho also had a 6 beat run of wide-complex tachycardia at a rate of 131.   Remotely, Tanner Ho had developed breakthrough atrial fibrillation on amiodarone 100 mg and Tanner Ho has been on 100 mg, alternating with 200 mg daily.  On this most recent monitor, there were no episodes of recurrent AF.   Tanner Ho has been on amiodarone 100 mg daily, Norvasc 2.5 mg, losartan 50 mg bid, HCTZ 12.5 mg daily, Toprol-XL 12.5 mg daily, Crestor 5 mg and Coumadin.  Tanner Ho also has noticed rare episodes of vertigo for which Tanner Ho takes meclizine.    Tanner Ho also has been on Uroxatral to improve urinary flow. When I last saw him we had a lengthy discussion concerning warfarin versus new or anticoagulants.  Tanner Ho opted to switch to Xarelto and has been maintained on 15 mg daily based on his creatinine clearance.  Tanner Ho has tolerated this well without bleeding.  Tanner Ho was evaluated by Tanner Ho the office on 12/27/2015 and had noticed an occasional irregularity of his heartbeat.  When she evaluated him, Tanner Ho was still in sinus rhythm without ectopy and there were no acute changes.  Presently continues to feel that his pulse has been stable.  Tanner Ho denies chest pain.  Tanner Ho denies presyncope or syncope.  When I saw him in follow-up, Tanner Ho was in sinus rhythm.  When I saw 1 week later for a follow-up cardiology evaluation, Tanner Ho was very stable.  Tanner Ho was denying any episodes of palpitations.  Tanner Ho is not required use of when necessary metoprolol.  His ECG revealed sinus bradycardia at 58 bpm.  Tanner Ho was unaware of any any heart rate irregularity. Tanner Ho had checked his laboratory.  Tanner Ho denied any episodes of chest pain, presyncope or syncope.  Tanner Ho developed recurrent atrial fibrillation with a rapid ventricular response and presented to [facility] ER on March 15.  Tanner Ho was given metoprolol 5 mg intravenously which brought his heart rate down.  Tanner Ho went home after several hours.  The following morning Tanner Ho was advised to increase amiodarone from 100 mg to 200 mg daily.  That night, again Tanner Ho develop recurrent AF with rapid ventricular response and repeat presented to the  emergency room on 08/16/2016.  At this time.  Tanner Ho was given metoprolol IV 2.5 mg 2.  Tanner Ho also was advised to take metoprolol 50 mg twice a day.  On this increased beta blocker dose, his blood pressure became low and Tanner Ho called and spoke with Tanner Ho several days later.  His medications were adjusted and Tanner Ho was told to stop amlodipine, reduce losartan to 25 mg, and reduce metoprolol to 25 mg twice a day.  When I last saw him 3 weeks ago, Tanner Ho was in atrial fibrillation with ventricular rate in the 90s.  I recommended further titration of amiodarone and increase this to 200 mg twice a day.  Also is on levothyroxine at 50 g.  TSH was normal at 3.38.  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  Tanner Ho underwent a 2-D echo Doppler study on 09/04/2016 and was still in atrial fibrillation at that time.  Ejection fraction was 55-60%.  There was mild late systolic prolapse of both leaflets with mild MR.  There was mild PA pressure elevation at 39 mm.  Aortic root dimension measured 39 mm.  On the following evening, Tanner Ho began to notice his heart rate reducing down into the upper 40s to 50s and his rhythm appeared more regular. Tanner Ho called the office and was advised to reduce his metoprolol dose.  Tanner Ho also a completely discontinue this altogether.  Tanner Ho now notes his pulse typically in the 60s.  His blood pressure has risen to the 140s. Tanner Ho presents for reevaluation.  When I saw him in April 2018, Tanner Ho was still in atrial fibrillation.  I recommended Tanner Ho continue amiodarone 200 mg twice a day at least for the next month.  At that time, I recommended Tanner Ho defer undergoing his YAG laser surgery procedure until his atrial fibrillation was controlled.  When Tanner Ho was seen in May 2018 , Tanner Ho had converted to sinus rhythm and with  pharmacologic cardioversion, Tanner Ho develops sinus bradycardia with rates in the upper 40s.  His metoprolol dose was reduced and ultimately discontinued.  Over the past several months, Tanner Ho  continued to feel well.   When I  saw him in September 2018, Tanner Ho was in normal sinus rhythm at 64 bpm.  With a creatinine of 1.78  I recommended that Tanner Ho decrease HCTZ to just 2 times per week depending upon his swelling and blood pressure.  Tanner Ho had called the office several weeks later complaining of increased heart rate irregularity and was seen on March 27, 2017.  His ECG revealed sinus rhythm at 71 bpm with occasional PACs occurring almost every 6 or 7 beats.  I recommended initiation of low-dose metoprolol at 12.5 twice a day.  Was to monitor his blood pressure.  Tanner Ho continued to be on Xarelto without bleeding.  There was no edema and reduced dose of HCTZ.  I  saw him in November 2019 and prior to that evaluation Tanner Ho had seen the pharmacist on several occasions.  Most recently Tanner Ho has been on amiodarone 200 mg daily, chlorthalidone 12.5 mg on Monday Wednesday and Fridays,  losartan 50 mg in the morning and 25 mg in the evening and apparently is no longer taking metoprolol.  Tanner Ho has felt well.  Tanner Ho denies chest pain shortness of breath or palpitations.  Tanner Ho does not believe Tanner Ho has had any recurrent A. fib over the past year.  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    I saw him in November 2020 and over the previous year, Tanner Ho has felt fairly well.  Tanner Ho was unaware of any recurrent atrial fibrillation.  His swelling has significantly resolved on his regimen consisting of chlorthalidone 12.5 mg on Monday Wednesday and Friday.  Tanner Ho continues to be on amiodarone 200 mg daily.  Tanner Ho is on losartan 50 mg in the morning and 25 mg at night, and has a prescription for metoprolol tartrate but has not required use.  Tanner Ho has hypothyroidism on levothyroxine at 75 mcg.  Tanner Ho continues to be on rosuvastatin 5 mg for hyperlipidemia.  Tanner Ho is on Xarelto at 15 mg dosing daily.  Tanner Ho had undergone laboratory 5 days prior to his evaluation and with his BUN increasing to 40 and creatinine to 1.87 from 1 year ago I recommended Tanner Ho decrease losartan to 25  mg twice a day and decrease chlorthalidone to at minimum twice per week.  His reduced renal function Tanner Ho was on a reduced dose of Xarelto at 15 mg.  Repeat laboratory on February 23 showed a creatinine of 1.8, and losartan was further reduced to 25 mg daily.  More recent lab on August 19, 2019 showed slight improvement in creatinine at 1.75 with a BUN improved at 30 with estimated GFR at 36 consistent with stage IIIb chronic kidney disease.  I saw him on August 20, 2019 at which time Tanner Ho continued to experience mild ankle edema bilaterally.  Tanner Ho continued to be on amiodarone 20 mg daily and at times Tanner Ho did get bradycardic with heart rates in the 40s.  Tanner Ho was unaware of palpitations.  Tanner Ho continued to take rosuvastatin and Lovaza for mixed hyperlipidemia.  During that evaluation I recommended Tanner Ho reduce his amiodarone down to 200 mg alternating with 100 mg every other day.  Since Tanner Ho was having leg edema, with his renal insufficiency rather than further increase his diuretic I recommended compression stockings at 20-30's millimeters of pressure.  Since I  saw him, Tanner Ho was evaluated by Tanner Deforest, PA on March 02, 2020 in a telemedicine visit.    I last saw him on September 04, 2020 at which time Tanner Ho continued to feel well.  Tanner Ho had recently seen presently, Tanner Ho has felt well.  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  Tanner Ho denied any presyncope or syncope.  His leg swelling had improved.    Presently, Tanner Ho has developed a right inguinal hernia and is scheduled to undergo hernia repair with mesh by Tanner Ho on April 17, 2021.   Past Medical History:  Diagnosis Date   Adrenal mass ([place])    Arthritis    Asthma    BPH (benign prostatic hyperplasia)    Cancer (HCC)    Bladder Cancer (early 90's)   CKD (chronic kidney disease),  stage III (HCC)    GERD (gastroesophageal reflux disease)    H/O cardiovascular stress test 03/2012  EKG with 1-2 mm ST segment depression but normal perfusion images. Followup metastases test reduced exercise effort and functional status and determinate for myocardial dysfunction   History of cardiac monitoring    a. 2015: cardiac monitor revealed episodes of PACs, bradycardia, with heart rates down to 48, 4 beat PSVT, 6 beat run of NSVT at a rate of 131. There was no recurrent afib.    Hyperlipidemia LDL goal < 100    Hypertension    Hypothyroidism    Macular degeneration    MVP (mitral valve prolapse)    on cardiac cath - not mentioned on 2014 echo.   NSVT (nonsustained ventricular tachycardia)    PAF (paroxysmal atrial fibrillation) ([place])    last episode 03/2012   Pericarditis    Posterior vitreous detachment of right eye 10/26/2019   Premature atrial contractions    Sinus bradycardia    Subconjunctival hemorrhage of left eye 10/26/2019   The condition of the involved eye is that of a subconjunctival hemorrhage.  These are often spontaneous but are also often at or near the site of low location of an injection should to be placed into the eye.  In the absence of direct blunt or severe trauma these will typically resolve on their own spontaneously and have no impact on the vision.    These are very similar to having a bruise in your    Past Surgical History:  Procedure Laterality Date   ABDOMINAL VASCULAR ULTRASOUND EVALUATION  12/20/2008   mild non-obstructive plaque, abdominal aorta, with mild calcification at the bifurcation-no obstruction, no evidence for aneurysm of the abdominal aorta   APPENDECTOMY     CARDIAC CATHETERIZATION  01/04/1999   hyperdynamic LV function, probable angiographic mitral valve prolapse, systolic muscle bridging of the mid LAD without obstructive disease, systolic narrowing up to 36%   Cardiopulmonary MET-test  07/20/2012   reduced exercise effort and  functional status, peak max O2 comsumption was only 58% of predicted, cardiovascular status was interpreted as indeterminate for myocardial dysfunction due to the suboptimal peak cardiovascular stress load.   EYE SURGERY Right    Detached Retina   HERNIA REPAIR     Incisional hernia repair (early 60s)   INGUINAL HERNIA REPAIR Right 04/17/2021   Procedure: RIGHT INGUINAL HERNIA REPAIR WITH MESH;  Surgeon: Tanner Mesa, MD;  Location: [facility];  Service: General;  Laterality: Right;   TONSILLECTOMY     UMBILICAL HERNIA REPAIR N/A 04/17/2021   Procedure: UMBILICAL HERNIA REPAIR;  Surgeon: Tanner Mesa, MD;  Location: [facility];  Service: General;  Laterality: N/A;  LMA & TAP BLOCK    Allergies  Allergen Reactions   Ciprofloxacin Other (See Comments)    Cannot take due to currently taking Amiodarone   Hytrin [Terazosin] Other (See Comments)    Syncopal event     Current Outpatient Medications  Medication Sig Dispense Refill   acetaminophen (TYLENOL) 325 MG tablet Take 650 mg by mouth every 6 (six) hours as needed for moderate pain.     acetaminophen (TYLENOL) 650 MG CR tablet Take 650 mg by mouth at bedtime as needed for pain.     alfuzosin (UROXATRAL) 10 MG 24 hr tablet Take 10 mg by mouth daily.     amiodarone (PACERONE) 200 MG tablet Take 1 tablet (200 mg total) by mouth daily. 90 tablet 3   ascorbic acid (VITAMIN C) 1000 MG tablet Take 1,000 mg by mouth daily.     Cholecalciferol (VITAMIN D) 50 MCG (2000 UT)  tablet Take 2,000 Units by mouth daily.     docusate sodium (COLACE) 100 MG capsule Take 100 mg by mouth 2 (two) times daily.     finasteride (PROSCAR) 5 MG tablet Take 5 mg by mouth daily.     hydrocortisone 2.5 % cream Apply 1 application topically as needed (itching).     ketoconazole (NIZORAL) 2 % cream Apply 1 application topically daily as needed for irritation.     levothyroxine (SYNTHROID) 75 MCG tablet Take 75 mcg by mouth daily before breakfast.  2   linaclotide (LINZESS)  145 MCG CAPS capsule Take 1 capsule (145 mcg total) by mouth as needed. 30 capsule 3   loratadine (CLARITIN) 10 MG tablet Take 10 mg by mouth daily.     losartan (COZAAR) 25 MG tablet TAKE 1 TABLET BY MOUTH EVERY DAY 90 tablet 3   loteprednol (LOTEMAX) 0.5 % ophthalmic suspension Place 1 drop into the right eye daily. 5 mL 1   melatonin 3 MG TABS tablet Take 3 mg by mouth at bedtime.     metoprolol tartrate (LOPRESSOR) 25 MG tablet Take 0.5 tablets (12.5 mg total) by mouth 2 (two) times daily as needed (afib). 90 tablet 3   Multiple Vitamins-Minerals (PRESERVISION AREDS) CAPS Take 1 capsule by mouth 2 (two) times daily.     Polyethyl Glycol-Propyl Glycol 0.4-0.3 % SOLN Place 2 drops into both eyes 2 (two) times daily as needed (for dry eyes).     rosuvastatin (CRESTOR) 5 MG tablet TAKE 1 TABLET BY MOUTH DAILY AT 6 PM. 90 tablet 3   simethicone (MYLICON) 462 MG chewable tablet Chew 125 mg by mouth every 6 (six) hours as needed for flatulence.     sodium chloride (OCEAN) 0.65 % SOLN nasal spray Place 1 spray into both nostrils as needed for congestion.     vitamin B-12 (CYANOCOBALAMIN) 1000 MCG tablet Take 1,000 mcg by mouth daily.     XARELTO 15 MG TABS tablet TAKE 1 TABLET (15 MG TOTAL) BY MOUTH DAILY WITH SUPPER. (Patient taking differently: Take 15 mg by mouth daily with lunch.) 90 tablet 1   chlorthalidone (HYGROTON) 25 MG tablet Take 0.5 tablets (12.5 mg total) by mouth every other day. 22.5 tablet 1   traMADol (ULTRAM) 50 MG tablet Take 1 tablet (50 mg total) by mouth every 6 (six) hours as needed for moderate pain. 20 tablet 0   No current facility-administered medications for this visit.    Social History   Socioeconomic History   Marital status: Married    Spouse name: Not on file   Number of children: Not on file   Years of education: Not on file   Highest education level: Not on file  Occupational History   Not on file  Tobacco Use   Smoking status: Former   Smokeless  tobacco: Never  Vaping Use   Vaping Use: Never used  Substance and Sexual Activity   Alcohol use: No   Drug use: No   Sexual activity: Not on file  Other Topics Concern   Not on file  Social History Narrative   Not on file   Social Determinants of Health   Financial Resource Strain: Not on file  Food Insecurity: Not on file  Transportation Needs: Not on file  Physical Activity: Not on file  Stress: Not on file  Social Connections: Not on file  Intimate Partner Violence: Not on file    Social history is notable in that Tanner Ho is married. 2  children 4 grandchildren. No tobacco alcohol use.  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, status post surgery by Tanner Ho; no change in hearing, sinus congestion, difficulty swallowing Pulmonary: Negative; No cough, wheezing, shortness of breath, hemoptysis Cardiovascular: See history of present illness Edema has improved GI: Positive for GERD; No nausea, vomiting, diarrhea, or abdominal pain GU: Negative; No dysuria, hematuria, or difficulty voiding Musculoskeletal: Negative; no myalgias, joint pain, or weakness Hematologic/Oncology: Negative; no easy bruising, bleeding Endocrine: Negative; no heat/cold intolerance; no diabetes Neuro: Negative; no changes in balance, headaches Skin: 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   PE BP 130/68   Pulse (!) 55   Ht _0  (1.727 m)   Wt 151 lb (68.5 kg)   SpO2 98%   BMI 22.96 kg/m    Repeat blood pressure by me 120/60  Wt Readings from Last 3 Encounters:  04/17/21 151 lb (68.5 kg)  04/13/21 160 lb (72.6  kg)  04/13/21 151 lb (68.5 kg)   General: Alert, oriented, no distress.  Skin: normal turgor, no rashes, warm and dry HEENT: Normocephalic, atraumatic. Pupils equal round and reactive to light; sclera anicteric; extraocular muscles intact;  Nose without nasal septal hypertrophy Mouth/Parynx benign; Mallinpatti scale 3 Neck: No JVD, no carotid bruits; normal carotid upstroke Lungs: clear to ausculatation and percussion; no wheezing or rales Chest wall: without tenderness to palpitation Heart: PMI not displaced, RR bradycardic in the mid 50s, s1 s2 normal, 1/6 systolic murmur, no diastolic murmur, no rubs, gallops, thrills, or heaves Abdomen: right inguinal hernia, nontender; no hepatosplenomehaly, BS+; abdominal aorta nontender and not dilated by palpation. Back: no CVA tenderness Pulses 2+ Musculoskeletal: full range of motion, normal strength, no joint deformities Extremities: no clubbing cyanosis or edema, Homan's sign negative  Neurologic: grossly nonfocal; Cranial nerves grossly wnl Psychologic: Normal mood and affect  April 13, 2021 ECG (independently read by me): Sinus bradycardia at 55, nonspecific T wave   September 04, 2020 ECG (independently read by me): NSR at 71, LAD, LVH with repolariation  March 2021 ECG (independently read by me): Sinus bradycardia 57 bpm with PAC, nonspecific ST abnormality.  PR interval 180 ms, QTc interval 449 ms.  November 2020 ECG (independently read by me): Sinus bradycardia 58 bpm, left axis deviation, small nondiagnostic lateral Q waves  November 2019 ECG (independently read by me): Normal sinus rhythm at 61 bpm.  Nonspecific ST-T changes.  No ectopy.  October 2018 ECG (independently read by me): Normal sinus rhythm at 71 bpm, occasional PACs every sixth beat, nonspecific ST changes.  QTc interval 473 ms.  September 2018 (independently read by me): Normal sinus rhythm at 64 bpm.  QTc interval 455 ms.  Possible left atrial enlargement.  Nonspecific  ST-T changes.  May 2018 ECG (independently read by me): Sinus bradycardia 59 bpm.  Possible left atrial enlargement.  No ST segment changes.  QTc interval 463 ms.  April 2018 ECG (independently read by me): Normal sinus rhythm at 69 bpm.  PACs, nonspecific ST changes.  Normal intervals.  08/20/2016 ECG (independently read by me): Atrial fibrillation with ventricular rate at 96 bpm.  Nonspecific ST-T changes.  Lateral T-wave changes.  08/13/2016 ECG (independently  read by me): Sinus bradycardia 58 bpm.  Nonspecific ST changes.  Normal intervals.  September 2017 ECG (independently read by me): Sinus rhythm at 68.  Normal intervals.  No significant ST-T changes.  December 2016 ECG (independently read by me): Normal sinus rhythm at 61 bpm.  No ectopy.  QTc interval 434 ms.  June 2016 ECG (independently read by me): Sinus bradycardia 52 bpm.  QTc interval 407 ms.  PR interval 158 msec  December 2015 ECG (independently read by me): Sinus bradycardia at 49 bpm.  Nonspecific ST changes.  QTc interval 424 ms.  ECG (independently read by me): Sinus bradycardia 50 beats per minute.  Nonspecific ST changes.    Prior ECG: Sinus bradycardia at 50 beats per minute. No ectopy on ECG. Intervals normal  LABS: BMP Latest Ref Rng & Units 04/13/2021 02/15/2021 03/13/2020  Glucose 70 - 99 mg/dL 99 110(H) 93  BUN 8 - 23 mg/dL 32(H) 22 35(H)  Creatinine 0.61 - 1.24 mg/dL 1.55(H) 1.40(H) 1.63(H)  BUN/Creat Ratio 10 - 24 - 16 21  Sodium 135 - 145 mmol/L 138 138 141  Potassium 3.5 - 5.1 mmol/L 4.0 5.2 4.4  Chloride 98 - 111 mmol/L 104 100 101  CO2 22 - 32 mmol/L _0 Calcium 8.9 - 10.3 mg/dL 8.8(L) 8.7 9.5   Hepatic Function Latest Ref Rng & Units 04/22/2019 12/28/2014 01/05/2014  Total Protein 6.0 - 8.5 g/dL 6.8 6.2 6.7  Albumin 3.7 - 4.7 g/dL 4.2 3.7 4.4  AST 0 - 40 IU/L _1 ALT 0 - 44 IU/L _2 Alk Phosphatase 39 - 117 IU/L 143(H) 117(H) 107  Total Bilirubin 0.0 - 1.2 mg/dL 0.3 0.6 0.4    CBC Latest Ref Rng & Units 04/13/2021 02/15/2021 08/15/2016  WBC 4.0 - 10.5 K/uL 4.6 4.8 5.6  Hemoglobin 13.0 - 17.0 g/dL 12.7(L) 13.5 11.9(L)  Hematocrit 39.0 - 52.0 % 38.0(L) 37.1(L) 34.9(L)  Platelets 150 - 400 K/uL 165 160 156   Lab Results  Component Value Date   MCV 107.3 (H) 04/13/2021   MCV 100 (H) 02/15/2021   MCV 100.6 (H) 08/15/2016   Lab Results  Component Value Date   TSH 3.38 08/20/2016   Lipid Panel     Component Value Date/Time   CHOL 120 (L) 12/28/2014 0924   TRIG 85 12/28/2014 0924   HDL 49 12/28/2014 0924   CHOLHDL 2.4 12/28/2014 0924   VLDL 17 12/28/2014 0924   LDLCALC 54 12/28/2014 0924     RADIOLOGY: No results found.  IMPRESSION:  1. Preoperative clearance   2. Paroxysmal atrial fibrillation (HCC)   3. Essential hypertension   4. Chronic anticoagulation   5. Hyperlipidemia LDL goal <70   6. Hypothyroidism, unspecified type     ASSESSMENT AND PLAN: Ms. Beville is an 79 -year-old gentleman who has a history of hypertension, hyperlipidemia, hypothyroidism, mitral valve prolapse, as well as paroxysmal atrial fibrillation. Tanner Ho had been maintaining sinus rhythm and was without recurrent atrial fibrillation until July 2017 when Tanner Ho developed recurrent atrial fibrillation with rapid ventricular response and was evaluated on 2 consecutive evenings in the emergency room where Tanner Ho was treated with IV metoprolol for rate slowing. Tanner Ho developed recurrent atrial fibrillation in April 2018 and ultimately pharmacologically cardioverted back to sinus rhythm.  On subsequent evaluation, Tanner Ho was having some PACs and I initiated low-dose beta-blocker therapy.  Over the past several years Tanner Ho has been maintaining sinus rhythm and has been without recurrent atrial  fibrillation.  At a prior evaluation, I recommended reduction of amiodarone down to 200 mg alternating with 100 mg every other day.  His bradycardia has improved.  Tanner Ho is not aware of any palpitations or recurrent  dysrhythmic events.  His blood pressure today is stable and a repeat by me was 120/68.  Tanner Ho has had some leg swelling and apparently has been taking chlorthalidone 12.5 mg 2 days/week.  I have suggested slight additional increase to every other day.  Apparently Tanner Ho also has been taking amiodarone 200 mg daily and with his sinus bradycardia I again recommended reduction back to 200 alternating with 100 mg every other day.  Tanner Ho is given clearance to undergo his planned inguinal hernia surgery which is scheduled for Tuesday, November 15.  Tanner Ho has been on reduced dose of Xarelto at 15 mg daily.  I have recommended Tanner Ho take his last dose of Xarelto on Saturday 12, and not take on Sunday Monday or the morning of his planned surgery.  Tanner Ho will then resume treatment once okay surgically.  Tanner Ho continues to be on rosuvastatin for hyperlipidemia and levothyroxine 75 mcg for hypothyroidism.  Tanner Ho sees Dr. [name] checking laboratory.  I will see him in 6 months for reevaluation or sooner as needed.    Troy Sine, MD, [facility]  04/30/2021 6:30 PM

## 2021-04-16 NOTE — Anesthesia Preprocedure Evaluation (Addendum)
Anesthesia Evaluation  Patient identified by MRN, date of birth, ID band Patient awake    Reviewed: Allergy & Precautions, NPO status , Patient's Chart, lab work & pertinent test results  Airway Mallampati: II  TM Distance: <3 FB Neck ROM: Full    Dental  (+) Teeth Intact, Dental Advisory Given   Pulmonary asthma , former smoker,    Pulmonary exam normal breath sounds clear to auscultation       Cardiovascular hypertension, Pt. on medications Normal cardiovascular exam+ dysrhythmias Atrial Fibrillation + Valvular Problems/Murmurs MVP  Rhythm:Regular Rate:Normal     Neuro/Psych negative neurological ROS     GI/Hepatic Neg liver ROS, GERD  ,  Endo/Other  Hypothyroidism   Renal/GU Renal InsufficiencyRenal disease   H/o bladder cancer     Musculoskeletal  (+) Arthritis ,   Abdominal   Peds  Hematology  (+) Blood dyscrasia (Xarelto), anemia ,   Anesthesia Other Findings   Reproductive/Obstetrics                           Anesthesia Physical Anesthesia Plan  ASA: 3  Anesthesia Plan: General   Post-op Pain Management:  Regional for Post-op pain   Induction: Intravenous  PONV Risk Score and Plan: 3 and Dexamethasone and Ondansetron  Airway Management Planned: LMA  Additional Equipment:   Intra-op Plan:   Post-operative Plan: Extubation in OR  Informed Consent: I have reviewed the patients History and Physical, chart, labs and discussed the procedure including the risks, benefits and alternatives for the proposed anesthesia with the patient or authorized representative who has indicated his/her understanding and acceptance.     Dental advisory given  Plan Discussed with: CRNA  Anesthesia Plan Comments: (PAT note by Karoline Caldwell, PA-C: Follows with cardiology for hx of MVP, HTN, HLD, hypothyroidism, PAF on chronic anticoagulation with Xarelto.  No history of ischemic heart  disease.  Cardiac clearance per telephone encounter 03/21/2021, "Chart reviewed as part of pre-operative protocol coverage. Given past medical history and time since last visit, based on ACC/AHA guidelines,Jaaziah E Noellwould be at acceptable risk for the planned procedure without further cardiovascular testing. Patient with diagnosis ofafibon Xareltofor anticoagulation.Marland KitchenMarland KitchenPer office protocol, patient can holdXareltofor 2days prior to procedure."  Preop labs reviewed, creatinine mildly elevated 1.55 (c/w history of CKD 3), otherwise unremarkable.  EKG 02/15/2021: Atrial fibrillation.  Ventricular rate 92.   TTE 09/04/16: - Left ventricle: The cavity size was normal. Wall thickness was  increased in a pattern of moderate LVH. Systolic function was  normal. The estimated ejection fraction was in the range of 55%  to 60%. Wall motion was normal; there were no regional wall  motion abnormalities. The study is not technically sufficient to  allow evaluation of LV diastolic function.  - Aortic valve: Trileaflet; mildly thickened leaflets. There was  mild regurgitation.  - Aorta: Aortic root dimension: 39 mm (ED).  - Aortic root: The aortic root is mildly dilated.  - Mitral valve: Mild late systolic bileaflet prolapse. There is  mild regurgitation. Mildly thickened leaflets .  - Left atrium: The atrium was normal in size.  - Atrial septum: No defect or patent foramen ovale was identified.  - Tricuspid valve: There was trivial regurgitation.  - Pulmonary arteries: PA peak pressure: 39 mm Hg (S).  - Inferior vena cava: The vessel was normal in size. The  respirophasic diameter changes were in the normal range (= 50%),  consistent with normal central venous pressure.  Impressions:   - Compared to a prior study in 2014, the LVEF is unchanged. The  aortic root is mildly dilated at 3.9 cm. 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   Nuclear stress  04/22/2013: Impression Exercise Capacity: Lexiscan with no exercise. BP Response: Normal blood pressure response. Clinical Symptoms: No significant symptoms noted. ECG Impression: No significant ECG changes with Lexiscan. Comparison with Prior Nuclear Study: No images to compare  Overall Impression: Low risk stress nuclear study with a fixed inferolateral defect, that is most likely due to a diaphragmatic attenuation artifact..  LV Wall Motion: NL LV Function; NL Wall Motion  )      Anesthesia Quick Evaluation

## 2021-04-16 NOTE — Progress Notes (Signed)
Anesthesia Chart Review:  Follows with cardiology for hx of MVP, HTN, HLD, hypothyroidism, PAF on chronic anticoagulation with Xarelto.  No history of ischemic heart disease.  Cardiac clearance per telephone encounter 03/21/2021, "Chart reviewed as part of pre-operative protocol coverage. Given past medical history and time since last visit, based on ACC/AHA guidelines, Tanner Ho would be at acceptable risk for the planned procedure without further cardiovascular testing. Patient with diagnosis of afib on Xarelto for anticoagulation.Marland KitchenMarland KitchenPer office protocol, patient can hold Xarelto for 2 days prior to procedure."  Preop labs reviewed, creatinine mildly elevated 1.55 (c/w history of CKD 3), otherwise unremarkable.  EKG 02/15/2021: Atrial fibrillation.  Ventricular rate 92.   TTE 09/04/16: - Left ventricle: The cavity size was normal. Wall thickness was    increased in a pattern of moderate LVH. Systolic function was    normal. The estimated ejection fraction was in the range of 55%    to 60%. Wall motion was normal; there were no regional wall    motion abnormalities. The study is not technically sufficient to    allow evaluation of LV diastolic function.  - Aortic valve: Trileaflet; mildly thickened leaflets. There was    mild regurgitation.  - Aorta: Aortic root dimension: 39 mm (ED).  - Aortic root: The aortic root is mildly dilated.  - Mitral valve: Mild late systolic bileaflet prolapse. There is    mild regurgitation. Mildly thickened leaflets .  - Left atrium: The atrium was normal in size.  - Atrial septum: No defect or patent foramen ovale was identified.  - Tricuspid valve: There was trivial regurgitation.  - Pulmonary arteries: PA peak pressure: 39 mm Hg (S).  - Inferior vena cava: The vessel was normal in size. The    respirophasic diameter changes were in the normal range (= 50%),    consistent with normal central venous pressure.   Impressions:   - Compared to a prior  study in 2014, the LVEF is unchanged. The    aortic root is mildly dilated at 3.9 cm. There is mild pulmonary    hypertension with an RVSP of 39 mmHg.   Nuclear stress 04/22/2013: Impression Exercise Capacity:  Lexiscan with no exercise. BP Response:  Normal blood pressure response. Clinical Symptoms:  No significant symptoms noted. ECG Impression:  No significant ECG changes with Lexiscan. Comparison with Prior Nuclear Study: No images to compare   Overall Impression:  Low risk stress nuclear study with a fixed inferolateral defect, that is most likely due to a diaphragmatic attenuation artifact..   LV Wall Motion:  NL LV Function; NL Wall Motion      Wynonia Musty [facility]/Anesthesiology Phone [phone] 04/16/2021 9:08 AM

## 2021-04-17 ENCOUNTER — Encounter (HOSPITAL_COMMUNITY)
Admission: RE | Disposition: A | Discharge status: Home / Self Care | Payer: Self-pay | Source: Home / Self Care | Attending: Surgery

## 2021-04-17 ENCOUNTER — Ambulatory Visit (HOSPITAL_COMMUNITY): Payer: Medicare Other | Admitting: Physician Assistant

## 2021-04-17 ENCOUNTER — Other Ambulatory Visit: Payer: Self-pay

## 2021-04-17 ENCOUNTER — Ambulatory Visit (HOSPITAL_COMMUNITY): Payer: Medicare Other | Admitting: Certified Registered Nurse Anesthetist

## 2021-04-17 ENCOUNTER — Observation Stay (HOSPITAL_COMMUNITY)
Admission: RE | Admit: 2021-04-17 | Discharge: 2021-04-18 | Disposition: A | Discharge status: Home / Self Care | Payer: Medicare Other | Attending: Surgery | Admitting: Surgery

## 2021-04-17 ENCOUNTER — Encounter (HOSPITAL_COMMUNITY): Payer: Self-pay | Admitting: Surgery

## 2021-04-17 DIAGNOSIS — J45909 Unspecified asthma, uncomplicated: Secondary | ICD-10-CM | POA: Insufficient documentation

## 2021-04-17 DIAGNOSIS — Z79899 Other long term (current) drug therapy: Secondary | ICD-10-CM | POA: Insufficient documentation

## 2021-04-17 DIAGNOSIS — Z7901 Long term (current) use of anticoagulants: Secondary | ICD-10-CM | POA: Insufficient documentation

## 2021-04-17 DIAGNOSIS — E039 Hypothyroidism, unspecified: Secondary | ICD-10-CM | POA: Insufficient documentation

## 2021-04-17 DIAGNOSIS — K429 Umbilical hernia without obstruction or gangrene: Secondary | ICD-10-CM | POA: Diagnosis not present

## 2021-04-17 DIAGNOSIS — Z87891 Personal history of nicotine dependence: Secondary | ICD-10-CM | POA: Insufficient documentation

## 2021-04-17 DIAGNOSIS — G8918 Other acute postprocedural pain: Secondary | ICD-10-CM | POA: Diagnosis not present

## 2021-04-17 DIAGNOSIS — I129 Hypertensive chronic kidney disease with stage 1 through stage 4 chronic kidney disease, or unspecified chronic kidney disease: Secondary | ICD-10-CM | POA: Insufficient documentation

## 2021-04-17 DIAGNOSIS — N183 Chronic kidney disease, stage 3 unspecified: Secondary | ICD-10-CM | POA: Diagnosis not present

## 2021-04-17 DIAGNOSIS — Z8551 Personal history of malignant neoplasm of bladder: Secondary | ICD-10-CM | POA: Diagnosis not present

## 2021-04-17 DIAGNOSIS — K4090 Unilateral inguinal hernia, without obstruction or gangrene, not specified as recurrent: Secondary | ICD-10-CM | POA: Diagnosis not present

## 2021-04-17 DIAGNOSIS — I480 Paroxysmal atrial fibrillation: Secondary | ICD-10-CM | POA: Diagnosis not present

## 2021-04-17 HISTORY — PX: INGUINAL HERNIA REPAIR: SHX194

## 2021-04-17 HISTORY — PX: UMBILICAL HERNIA REPAIR: SHX196

## 2021-04-17 SURGERY — REPAIR, HERNIA, INGUINAL, ADULT
Anesthesia: General | Site: Abdomen | Laterality: Right

## 2021-04-17 MED ORDER — LORATADINE 10 MG PO TABS
10.0000 mg | ORAL_TABLET | Freq: Every day | ORAL | Status: DC
  Administered 2021-04-17 – 2021-04-18 (×2): 10 mg via ORAL
  Filled 2021-04-17 (×2): qty 1

## 2021-04-17 MED ORDER — 0.9 % SODIUM CHLORIDE (POUR BTL) OPTIME
TOPICAL | Status: DC | PRN
  Administered 2021-04-17: 1000 mL

## 2021-04-17 MED ORDER — PHENYLEPHRINE 40 MCG/ML (10ML) SYRINGE FOR IV PUSH (FOR BLOOD PRESSURE SUPPORT)
PREFILLED_SYRINGE | INTRAVENOUS | Status: DC | PRN
Start: 2021-04-17 — End: 2021-04-17
  Administered 2021-04-17 (×2): 40 ug via INTRAVENOUS

## 2021-04-17 MED ORDER — BUPIVACAINE LIPOSOME 1.3 % IJ SUSP
INTRAMUSCULAR | Status: DC | PRN
  Administered 2021-04-17: 10 mL via PERINEURAL

## 2021-04-17 MED ORDER — FENTANYL CITRATE (PF) 100 MCG/2ML IJ SOLN: 25.0000 ug | INTRAMUSCULAR | Status: DC | PRN

## 2021-04-17 MED ORDER — ALFUZOSIN HCL ER 10 MG PO TB24
10.0000 mg | ORAL_TABLET | Freq: Every day | ORAL | Status: DC
  Administered 2021-04-17: 10 mg via ORAL
  Filled 2021-04-17 (×2): qty 1

## 2021-04-17 MED ORDER — DEXAMETHASONE SODIUM PHOSPHATE 10 MG/ML IJ SOLN
INTRAMUSCULAR | Status: AC
  Filled 2021-04-17: qty 1

## 2021-04-17 MED ORDER — ENOXAPARIN SODIUM 40 MG/0.4ML IJ SOSY
40.0000 mg | PREFILLED_SYRINGE | INTRAMUSCULAR | Status: DC
  Administered 2021-04-18: 40 mg via SUBCUTANEOUS
  Filled 2021-04-17: qty 0.4

## 2021-04-17 MED ORDER — ACETAMINOPHEN 325 MG PO TABS
650.0000 mg | ORAL_TABLET | Freq: Once | ORAL | Status: AC
  Filled 2021-04-17: qty 2

## 2021-04-17 MED ORDER — ACETAMINOPHEN 325 MG PO TABS
ORAL_TABLET | ORAL | Status: AC
  Administered 2021-04-17: 650 mg via ORAL
  Filled 2021-04-17: qty 2

## 2021-04-17 MED ORDER — ONDANSETRON HCL 4 MG/2ML IJ SOLN: 4.0000 mg | Freq: Four times a day (QID) | INTRAMUSCULAR | Status: DC | PRN

## 2021-04-17 MED ORDER — POLYVINYL ALCOHOL 1.4 % OP SOLN
1.0000 [drp] | OPHTHALMIC | Status: DC | PRN
Start: 2021-04-17 — End: 2021-04-18
  Filled 2021-04-17: qty 15

## 2021-04-17 MED ORDER — FINASTERIDE 5 MG PO TABS
5.0000 mg | ORAL_TABLET | Freq: Every day | ORAL | Status: DC
  Administered 2021-04-17: 5 mg via ORAL
  Filled 2021-04-17 (×2): qty 1

## 2021-04-17 MED ORDER — CHLORHEXIDINE GLUCONATE 0.12 % MT SOLN
15.0000 mL | Freq: Once | OROMUCOSAL | Status: AC
  Administered 2021-04-17: 15 mL via OROMUCOSAL
  Filled 2021-04-17: qty 15

## 2021-04-17 MED ORDER — FENTANYL CITRATE (PF) 250 MCG/5ML IJ SOLN
INTRAMUSCULAR | Status: AC
  Filled 2021-04-17: qty 5

## 2021-04-17 MED ORDER — PHENYLEPHRINE 40 MCG/ML (10ML) SYRINGE FOR IV PUSH (FOR BLOOD PRESSURE SUPPORT)
PREFILLED_SYRINGE | INTRAVENOUS | Status: AC
  Filled 2021-04-17: qty 10

## 2021-04-17 MED ORDER — SODIUM CHLORIDE 0.9 % IV SOLN: INTRAVENOUS | Status: DC

## 2021-04-17 MED ORDER — PROPOFOL 10 MG/ML IV BOLUS
INTRAVENOUS | Status: AC
  Filled 2021-04-17: qty 40

## 2021-04-17 MED ORDER — OXYCODONE HCL 5 MG PO TABS: 5.0000 mg | ORAL_TABLET | ORAL | Status: DC | PRN

## 2021-04-17 MED ORDER — CEFAZOLIN SODIUM-DEXTROSE 2-4 GM/100ML-% IV SOLN
2.0000 g | INTRAVENOUS | Status: AC
  Administered 2021-04-17: 2 g via INTRAVENOUS
  Filled 2021-04-17: qty 100

## 2021-04-17 MED ORDER — ORAL CARE MOUTH RINSE: 15.0000 mL | Freq: Once | OROMUCOSAL | Status: AC

## 2021-04-17 MED ORDER — LEVOTHYROXINE SODIUM 75 MCG PO TABS
75.0000 ug | ORAL_TABLET | Freq: Every day | ORAL | Status: DC
  Administered 2021-04-18: 75 ug via ORAL
  Filled 2021-04-17: qty 1

## 2021-04-17 MED ORDER — PHENYLEPHRINE HCL-NACL 20-0.9 MG/250ML-% IV SOLN
INTRAVENOUS | Status: DC | PRN
  Administered 2021-04-17: 25 ug/min via INTRAVENOUS

## 2021-04-17 MED ORDER — PROPOFOL 10 MG/ML IV BOLUS
INTRAVENOUS | Status: DC | PRN
  Administered 2021-04-17: 50 mg via INTRAVENOUS
  Administered 2021-04-17: 100 mg via INTRAVENOUS
  Administered 2021-04-17: 30 mg via INTRAVENOUS

## 2021-04-17 MED ORDER — LIDOCAINE 2% (20 MG/ML) 5 ML SYRINGE
INTRAMUSCULAR | Status: AC
  Filled 2021-04-17: qty 5

## 2021-04-17 MED ORDER — POLYETHYL GLYCOL-PROPYL GLYCOL 0.4-0.3 % OP SOLN: 2.0000 [drp] | Freq: Two times a day (BID) | OPHTHALMIC | Status: DC | PRN

## 2021-04-17 MED ORDER — ACETAMINOPHEN 500 MG PO TABS
1000.0000 mg | ORAL_TABLET | ORAL | Status: DC
  Filled 2021-04-17: qty 2

## 2021-04-17 MED ORDER — MORPHINE SULFATE (PF) 2 MG/ML IV SOLN: 2.0000 mg | INTRAVENOUS | Status: DC | PRN

## 2021-04-17 MED ORDER — ACETAMINOPHEN 325 MG PO TABS
650.0000 mg | ORAL_TABLET | Freq: Four times a day (QID) | ORAL | Status: DC | PRN
  Administered 2021-04-18: 650 mg via ORAL
  Filled 2021-04-17: qty 2

## 2021-04-17 MED ORDER — CHLORTHALIDONE 25 MG PO TABS
12.5000 mg | ORAL_TABLET | ORAL | Status: DC
  Administered 2021-04-18: 12.5 mg via ORAL
  Filled 2021-04-17: qty 0.5

## 2021-04-17 MED ORDER — METOPROLOL TARTRATE 12.5 MG HALF TABLET: 12.5000 mg | ORAL_TABLET | Freq: Two times a day (BID) | ORAL | Status: DC | PRN

## 2021-04-17 MED ORDER — BUPIVACAINE-EPINEPHRINE 0.25% -1:200000 IJ SOLN
INTRAMUSCULAR | Status: DC | PRN
  Administered 2021-04-17: 5 mL
  Administered 2021-04-17: 10 mL

## 2021-04-17 MED ORDER — CHLORHEXIDINE GLUCONATE CLOTH 2 % EX PADS: 6.0000 | MEDICATED_PAD | Freq: Once | CUTANEOUS | Status: DC

## 2021-04-17 MED ORDER — ONDANSETRON HCL 4 MG/2ML IJ SOLN
INTRAMUSCULAR | Status: AC
  Filled 2021-04-17: qty 2

## 2021-04-17 MED ORDER — LIDOCAINE 2% (20 MG/ML) 5 ML SYRINGE
INTRAMUSCULAR | Status: DC | PRN
  Administered 2021-04-17: 60 mg via INTRAVENOUS

## 2021-04-17 MED ORDER — AMIODARONE HCL 200 MG PO TABS
200.0000 mg | ORAL_TABLET | Freq: Every day | ORAL | Status: DC
  Administered 2021-04-18: 200 mg via ORAL
  Filled 2021-04-17: qty 1

## 2021-04-17 MED ORDER — LACTATED RINGERS IV SOLN: INTRAVENOUS | Status: DC

## 2021-04-17 MED ORDER — ROSUVASTATIN CALCIUM 5 MG PO TABS
5.0000 mg | ORAL_TABLET | Freq: Every day | ORAL | Status: DC
  Administered 2021-04-17: 5 mg via ORAL
  Filled 2021-04-17 (×2): qty 1

## 2021-04-17 MED ORDER — DEXAMETHASONE SODIUM PHOSPHATE 10 MG/ML IJ SOLN
INTRAMUSCULAR | Status: DC | PRN
  Administered 2021-04-17: 5 mg via INTRAVENOUS

## 2021-04-17 MED ORDER — ONDANSETRON HCL 4 MG/2ML IJ SOLN: 4.0000 mg | Freq: Once | INTRAMUSCULAR | Status: DC | PRN

## 2021-04-17 MED ORDER — EPHEDRINE SULFATE-NACL 50-0.9 MG/10ML-% IV SOSY
PREFILLED_SYRINGE | INTRAVENOUS | Status: DC | PRN
  Administered 2021-04-17 (×2): 10 mg via INTRAVENOUS
  Administered 2021-04-17: 5 mg via INTRAVENOUS

## 2021-04-17 MED ORDER — TRAMADOL HCL 50 MG PO TABS: 50.0000 mg | ORAL_TABLET | Freq: Four times a day (QID) | ORAL | Status: DC | PRN

## 2021-04-17 MED ORDER — DOCUSATE SODIUM 100 MG PO CAPS
100.0000 mg | ORAL_CAPSULE | Freq: Two times a day (BID) | ORAL | Status: DC
  Administered 2021-04-17 – 2021-04-18 (×2): 100 mg via ORAL
  Filled 2021-04-17 (×2): qty 1

## 2021-04-17 MED ORDER — BUPIVACAINE-EPINEPHRINE (PF) 0.25% -1:200000 IJ SOLN
INTRAMUSCULAR | Status: AC
  Filled 2021-04-17: qty 30

## 2021-04-17 MED ORDER — FENTANYL CITRATE (PF) 250 MCG/5ML IJ SOLN
INTRAMUSCULAR | Status: DC | PRN
  Administered 2021-04-17 (×2): 25 ug via INTRAVENOUS
  Administered 2021-04-17: 50 ug via INTRAVENOUS

## 2021-04-17 MED ORDER — EPHEDRINE 5 MG/ML INJ
INTRAVENOUS | Status: AC
  Filled 2021-04-17: qty 5

## 2021-04-17 MED ORDER — LOSARTAN POTASSIUM 25 MG PO TABS
25.0000 mg | ORAL_TABLET | Freq: Every day | ORAL | Status: DC
  Administered 2021-04-17 – 2021-04-18 (×2): 25 mg via ORAL
  Filled 2021-04-17 (×2): qty 1

## 2021-04-17 MED ORDER — ONDANSETRON 4 MG PO TBDP: 4.0000 mg | ORAL_TABLET | Freq: Four times a day (QID) | ORAL | Status: DC | PRN

## 2021-04-17 MED ORDER — LOTEPREDNOL ETABONATE 0.5 % OP SUSP
1.0000 [drp] | Freq: Every day | OPHTHALMIC | Status: DC
  Filled 2021-04-17: qty 5

## 2021-04-17 MED ORDER — ONDANSETRON HCL 4 MG/2ML IJ SOLN
INTRAMUSCULAR | Status: DC | PRN
  Administered 2021-04-17: 4 mg via INTRAVENOUS

## 2021-04-17 MED ORDER — BUPIVACAINE HCL (PF) 0.25 % IJ SOLN
INTRAMUSCULAR | Status: DC | PRN
  Administered 2021-04-17: 20 mL via PERINEURAL

## 2021-04-17 SURGICAL SUPPLY — 44 items
BAG COUNTER SPONGE SURGICOUNT (BAG) ×3 IMPLANT
BAG SURGICOUNT SPONGE COUNTING (BAG) ×1
BENZOIN TINCTURE PRP APPL 2/3 (GAUZE/BANDAGES/DRESSINGS) ×4 IMPLANT
BLADE CLIPPER SURG (BLADE) ×4 IMPLANT
CANISTER SUCT 3000ML PPV (MISCELLANEOUS) ×4 IMPLANT
CHLORAPREP W/TINT 26 (MISCELLANEOUS) ×4 IMPLANT
CLOSURE WOUND 1/2 X4 (GAUZE/BANDAGES/DRESSINGS) ×1
COVER SURGICAL LIGHT HANDLE (MISCELLANEOUS) ×4 IMPLANT
DRAIN PENROSE 1/2X12 LTX STRL (WOUND CARE) ×4 IMPLANT
DRAPE LAPAROSCOPIC ABDOMINAL (DRAPES) ×4 IMPLANT
DRAPE LAPAROTOMY TRNSV 102X78 (DRAPES) IMPLANT
DRSG TEGADERM 4X4.75 (GAUZE/BANDAGES/DRESSINGS) ×4 IMPLANT
ELECT CAUTERY BLADE 6.4 (BLADE) IMPLANT
ELECT REM PT RETURN 9FT ADLT (ELECTROSURGICAL) ×4
ELECTRODE REM PT RTRN 9FT ADLT (ELECTROSURGICAL) ×2 IMPLANT
GAUZE 4X4 16PLY ~~LOC~~+RFID DBL (SPONGE) ×8 IMPLANT
GAUZE SPONGE 2X2 8PLY STRL LF (GAUZE/BANDAGES/DRESSINGS) ×2 IMPLANT
GAUZE SPONGE 4X4 12PLY STRL (GAUZE/BANDAGES/DRESSINGS) ×4 IMPLANT
GLOVE SURG ENC MOIS LTX SZ7 (GLOVE) ×4 IMPLANT
GLOVE SURG UNDER POLY LF SZ7.5 (GLOVE) ×4 IMPLANT
GOWN STRL REUS W/ TWL LRG LVL3 (GOWN DISPOSABLE) ×4 IMPLANT
GOWN STRL REUS W/TWL LRG LVL3 (GOWN DISPOSABLE) ×8
KIT BASIN OR (CUSTOM PROCEDURE TRAY) ×4 IMPLANT
KIT TURNOVER KIT B (KITS) ×4 IMPLANT
MESH PARIETEX PROGRIP RIGHT (Mesh General) ×4 IMPLANT
NEEDLE HYPO 25GX1X1/2 BEV (NEEDLE) ×4 IMPLANT
NS IRRIG 1000ML POUR BTL (IV SOLUTION) ×4 IMPLANT
PACK GENERAL/GYN (CUSTOM PROCEDURE TRAY) ×4 IMPLANT
PAD ARMBOARD 7.5X6 YLW CONV (MISCELLANEOUS) ×4 IMPLANT
PENCIL SMOKE EVACUATOR (MISCELLANEOUS) ×4 IMPLANT
SPONGE GAUZE 2X2 STER 10/PKG (GAUZE/BANDAGES/DRESSINGS) ×2
SPONGE INTESTINAL PEANUT (DISPOSABLE) ×4 IMPLANT
STRIP CLOSURE SKIN 1/2X4 (GAUZE/BANDAGES/DRESSINGS) ×3 IMPLANT
SUT MNCRL AB 4-0 PS2 18 (SUTURE) ×8 IMPLANT
SUT NOVA NAB DX-16 0-1 5-0 T12 (SUTURE) ×4 IMPLANT
SUT NOVA NAB GS-21 0 18 T12 DT (SUTURE) ×4 IMPLANT
SUT VIC AB 0 CT2 27 (SUTURE) ×8 IMPLANT
SUT VIC AB 2-0 SH 27 (SUTURE) ×4
SUT VIC AB 2-0 SH 27X BRD (SUTURE) ×2 IMPLANT
SUT VIC AB 3-0 SH 27 (SUTURE) ×8
SUT VIC AB 3-0 SH 27X BRD (SUTURE) ×4 IMPLANT
SYR CONTROL 10ML LL (SYRINGE) ×4 IMPLANT
TOWEL GREEN STERILE (TOWEL DISPOSABLE) ×4 IMPLANT
TOWEL GREEN STERILE FF (TOWEL DISPOSABLE) ×4 IMPLANT

## 2021-04-17 NOTE — Transfer of Care (Signed)
Immediate Anesthesia Transfer of Care Note  Patient: Tanner Ho  Procedure(s) Performed: RIGHT INGUINAL HERNIA REPAIR WITH MESH (Right: Abdomen) UMBILICAL HERNIA REPAIR (Abdomen)  Patient Location: PACU  Anesthesia Type:GA combined with regional for post-op pain  Level of Consciousness: drowsy and patient cooperative  Airway & Oxygen Therapy: Patient Spontanous Breathing and Patient connected to nasal cannula oxygen  Post-op Assessment: Report given to RN and Post -op Vital signs reviewed and stable  Post vital signs: Reviewed and stable  Last Vitals:  Vitals Value Taken Time  BP 163/65 04/17/21 0903  Temp    Pulse 59 04/17/21 0903  Resp 14 04/17/21 0903  SpO2 98 % 04/17/21 0903    Last Pain:  Vitals:   04/17/21 0629  TempSrc:   PainSc: 0-No pain         Complications: No notable events documented.

## 2021-04-17 NOTE — Anesthesia Procedure Notes (Addendum)
Anesthesia Regional Block: TAP block   Pre-Anesthetic Checklist: , timeout performed,  Correct Patient, Correct Site, Correct Laterality,  Correct Procedure, Correct Position, site marked,  Risks and benefits discussed,  Surgical consent,  Pre-op evaluation,  At surgeon's request and post-op pain management  Laterality: Right  Prep: chloraprep       Needles:  Injection technique: Single-shot  Needle Type: Echogenic Needle     Needle Length: 9cm  Needle Gauge: 21     Additional Needles:   Procedures:,,,, ultrasound used (permanent image in chart),,    Narrative:  Start time: 04/17/2021 7:05 AM End time: 04/17/2021 7:15 AM Injection made incrementally with aspirations every 5 mL.  Performed by: Personally  Anesthesiologist: Catalina Gravel, MD  Additional Notes: No pain on injection. No increased resistance to injection. Injection made in 5cc increments.  Good needle visualization.  Patient tolerated procedure well.

## 2021-04-17 NOTE — Anesthesia Procedure Notes (Signed)
Procedure Name: LMA Insertion Date/Time: 04/17/2021 7:31 AM Performed by: Colin Benton, CRNA Pre-anesthesia Checklist: Patient identified, Emergency Drugs available, Suction available and Patient being monitored Patient Re-evaluated:Patient Re-evaluated prior to induction Oxygen Delivery Method: Circle System Utilized Preoxygenation: Pre-oxygenation with 100% oxygen Induction Type: IV induction Ventilation: Mask ventilation without difficulty LMA: LMA inserted LMA Size: 4.0 Number of attempts: 1 Placement Confirmation: positive ETCO2 Tube secured with: Tape Dental Injury: Teeth and Oropharynx as per pre-operative assessment

## 2021-04-17 NOTE — Progress Notes (Signed)
Patient took Tylenol 325 mg at 0100 today.  Per Dr. Gifford Shave, administered Tylenol 650 mg in preop.

## 2021-04-17 NOTE — Op Note (Signed)
Right inguinal hernia repair with mesh/ primary umbilical hernia repair  Indications: This is an 80 year old male with mitral valve prolapse, hypertension, hyperlipidemia, hypothyroidism, paroxysmal atrial fibrillation on Xarelto, who presents with a complaint of a right inguinal hernia.  The patient states that he noticed this within the last month.  It has become fairly large and causes some discomfort.  He denies any obstructive symptoms.  He does have a small umbilical hernia that he has had for many years but this causes minimal discomfort.  It is slightly larger.  No imaging has been performed.  The patient has chronic constipation and he is currently on Linzess.  Pre-operative Diagnosis: right reducible inguinal hernia/ umbilical hernia Post-operative Diagnosis: same  Surgeon: Maia Petties   Assistants: none  Anesthesia: General endotracheal anesthesia  ASA Class: 1  Procedure Details  The patient was seen again in the Holding Room. The risks, benefits, complications, treatment options, and expected outcomes were discussed with the patient. The possibilities of reaction to medication, pulmonary aspiration, perforation of viscus, bleeding, recurrent infection, the need for additional procedures, and development of a complication requiring transfusion or further operation were discussed with the patient and/or family. The likelihood of success in repairing the hernia and returning the patient to their previous functional status is good.  There was concurrence with the proposed plan, and informed consent was obtained. The site of surgery was properly noted/marked. The patient was taken to the Operating Room, identified as Tanner Ho, and the procedure verified as right inguinal hernia repair and umbilical hernia repair. A Time Out was held and the above information confirmed.  The patient was placed in the supine position and underwent induction of anesthesia. The abdomen and groin was  prepped with Chloraprep and draped in the standard fashion, and 0.25% Marcaine with epinephrine was used to anesthetize the skin over the mid-portion of the right inguinal canal. An oblique incision was made. Dissection was carried down through the subcutaneous tissue with cautery to the external oblique fascia.  We opened the external oblique fascia along the direction of its fibers to the external ring.  The spermatic cord was circumferentially dissected bluntly and retracted with a Penrose drain.  The floor of the inguinal canal was inspected and was very lax with a large loose internal ring.  We skeletonized the spermatic cord and reduced a very large indirect hernia sac.  The internal ring was tightened with 0 Vicryl  We used a right Progrip mesh which was inserted and deployed across the floor of the inguinal canal. The mesh was tucked underneath the external oblique fascia laterally.  The flap of the mesh was closed around the spermatic cord to recreate the internal inguinal ring.  The mesh was secured to the pubic tubercle with 0 Vicryl.  An additional stay suture of 0 Vicryl was used to tack down the inferior edge of the mesh to the shelving edge.  The external oblique fascia was reapproximated with 2-0 Vicryl.  3-0 Vicryl was used to close the subcutaneous tissues and 4-0 Monocryl was used to close the skin in subcuticular fashion.    We turned our attention to the umbilicus.  We made a transverse incision below the umbilicus.  Dissection was carried down to the hernia sac with cautery.  We dissected bluntly around the hernia sac down to the edge of the fascial defect.  We reduced the hernia sac back into the pre-peritoneal space.  The fascial defect measured 8 mm.  We cleared the fascia in all directions.  The fascial defect was closed with three nterrupted figure-of-eight 0 Novofil sutures.  The base of the umbilicus was tacked down with 3-0 Vicryl.  3-0 Vicryl was used to close the subcutaneous  tissues and 4-0 Monocryl was used to close the skin.  Steri-strips and clean dressings were applied to both incisions.  The patient was extubated and brought to the recovery room in stable condition.  All sponge, instrument, and needle counts were correct prior to closure and at the conclusion of the case.    Estimated Blood Loss: Minimal                 Complications: None; patient tolerated the procedure well.         Disposition: PACU - hemodynamically stable.         Condition: stable  Tanner Ho. Georgette Dover, MD, [facility]  General Surgery   04/17/2021 8:58 AM

## 2021-04-17 NOTE — H&P (Signed)
Subjective    Chief Complaint: Inguinal Hernia (Right/)       History of Present Illness: Tanner Ho is a 80 y.o. male who is seen today as an office consultation at the request of Dr. Shelia Media for evaluation of Inguinal Hernia (Right/) .     This is an 80 year old male with mitral valve prolapse, hypertension, hyperlipidemia, hypothyroidism, paroxysmal atrial fibrillation on Xarelto, who presents with a complaint of a right inguinal hernia.  The patient states that he noticed this within the last month.  It has become fairly large and causes some discomfort.  He denies any obstructive symptoms.  He does have a small umbilical hernia that he has had for many years but this causes minimal discomfort.  It is slightly larger.  No imaging has been performed.  The patient has chronic constipation and he is currently on Linzess.     Review of Systems: A complete review of systems was obtained from the patient.  I have reviewed this information and discussed as appropriate with the patient.  See HPI as well for other ROS.   Review of Systems  Constitutional: Negative.   HENT: Negative.   Eyes: Positive for blurred vision.  Respiratory: Negative.   Cardiovascular: Positive for palpitations.  Gastrointestinal: Positive for constipation.  Genitourinary: Negative.   Musculoskeletal: Negative.   Skin: Negative.   Neurological: Negative.   Endo/Heme/Allergies: Negative.   Psychiatric/Behavioral: Negative.         Medical History: Past Medical History         Past Medical History:  Diagnosis Date   Arrhythmia     Arthritis     Chronic kidney disease     GERD (gastroesophageal reflux disease)     History of cancer     Hypertension     Thyroid disease               Patient Active Problem List  Diagnosis   Abdominal pain, epigastric   Adrenal mass (CMS-HCC)   Asthma   Bladder cancer (CMS-HCC)   BPH (benign prostatic hyperplasia)   Chronic anticoagulation   Chronic kidney  disease, stage 3 unspecified (CMS-HCC)   Dyslipidemia   Essential (primary) hypertension   GERD (gastroesophageal reflux disease)   Hypothyroidism, unspecified   Macular degeneration   Pericarditis   Non-recurrent unilateral inguinal hernia without obstruction or gangrene   Umbilical hernia without obstruction or gangrene      Past Surgical History           Past Surgical History:  Procedure Laterality Date   APPENDECTOMY       bladder cancer       CATARACT EXTRACTION       detached retina N/A     PROSTATE SURGERY            Allergies           Allergies  Allergen Reactions   Ciprofloxacin Other (See Comments)      Cannot take due to currently taking Amiodarone Cannot take due to currently taking Amiodarone     Terazosin Other (See Comments)      Syncopal event  Syncopal event                      Current Outpatient Medications on File Prior to Visit  Medication Sig Dispense Refill   acetaminophen (TYLENOL) 650 MG ER tablet Take 650 mg by mouth every 8 (eight) hours as needed  alfuzosin (UROXATRAL) 10 mg ER tablet alfuzosin ER 10 mg tablet,extended release 24 hr       AMIOdarone (PACERONE) 200 MG tablet amiodarone 200 mg tablet       antiox #8/om3/dha/epa/lut/zeax (PRESERVISION AREDS 2, OMEGA-3, ORAL) Take by mouth       chlorthalidone 25 MG tablet chlorthalidone 25 mg tablet       cholecalciferol, vitamin D3, (CHOLECALCIFEROL, VIT D3,,BULK,) 100,000 unit/gram Powd Take by mouth       cyanocobalamin (VITAMIN B12) 1000 MCG tablet Take by mouth       finasteride (PROSCAR) 5 mg tablet finasteride 5 mg tablet       fluorometholone (FML) 0.1 % ophthalmic suspension fluorometholone 0.1 % eye drops,suspension       hydrocortisone 2.5 % cream APPLY A SMALL AMOUNT TO AFFECTED AREA TWICE A DAY APPLY TWICE DAILY TO EYEBROWS       ketoconazole (NIZORAL) 2 % cream APPLY A SMALL AMOUNT TO AFFECTED AREA TWICE A DAY APPLY TO GROIN TWICE DAILY       levothyroxine (SYNTHROID)  75 MCG tablet levothyroxine 75 mcg tablet       linaCLOtide (LINZESS) 145 mcg capsule Linzess 145 mcg capsule       loratadine (CLARITIN) 10 mg tablet Take 10 mg by mouth once daily       losartan (COZAAR) 25 MG tablet Take 25 mg by mouth once daily       melatonin 3 mg Cap Take by mouth       metoprolol tartrate (LOPRESSOR) 25 MG tablet metoprolol tartrate 25 mg tablet       ofloxacin (OCUFLOX) 0.3 % ophthalmic solution ofloxacin 0.3 % eye drops       pantoprazole (PROTONIX) 40 MG DR tablet pantoprazole 40 mg tablet,delayed release       rivaroxaban (XARELTO) 15 mg tablet TAKE 1 TABLET (15 MG TOTAL) BY MOUTH DAILY WITH SUPPER.       rosuvastatin (CRESTOR) 5 MG tablet rosuvastatin 5 mg tablet        No current facility-administered medications on file prior to visit.      Family History           Family History  Problem Relation Age of Onset   Stroke Mother     Skin cancer Mother     High blood pressure (Hypertension) Mother     Hyperlipidemia (Elevated cholesterol) Mother     High blood pressure (Hypertension) Father          Social History         Tobacco Use  Smoking Status Former Smoker  Smokeless Tobacco Never Used      Social History  Social History           Socioeconomic History   Marital status: Married  Tobacco Use   Smoking status: Former Smoker   Smokeless tobacco: Never Used  Scientific laboratory technician Use: Never used  Substance and Sexual Activity   Alcohol use: Never   Drug use: Never        Objective:           Vitals:    02/06/21 1501  BP: 138/82  Pulse: 62  Temp: 36.9 C (98.5 F)  SpO2: 98%  Weight: 70.9 kg (156 lb 6.4 oz)  Height: 175.3 cm (5\' 9" )    Body mass index is 23.1 kg/m.   Physical Exam    Constitutional:  WDWN in NAD, conversant, no obvious deformities; lying in bed comfortably  Eyes:  Pupils equal, round; sclera anicteric; moist conjunctiva; no lid lag HENT:  Oral mucosa moist; good dentition  Neck:  No masses palpated,  trachea midline; no thyromegaly Lungs:  CTA bilaterally; normal respiratory effort CV:  Regular rate and rhythm; no murmurs; extremities well-perfused with no edema Abd:  +bowel sounds, soft, non-tender, no palpable organomegaly; small umbilical hernia with a small amount of preperitoneal fat - reducible GU: Bilateral descended testes, no testicular masses, large right inguinal hernia that is reducible when he is supine, no sign of left inguinal hernia Musc: Walks with a rolling walker; frail; no apparent clubbing or cyanosis in extremities Lymphatic:  No palpable cervical or axillary lymphadenopathy Skin:  Warm, dry; no sign of jaundice Psychiatric - alert and oriented x 4; calm mood and affect     Assessment and Plan:  Diagnoses and all orders for this visit:   Non-recurrent unilateral inguinal hernia without obstruction or gangrene   Umbilical hernia without obstruction or gangrene     Right inguinal hernia repair with mesh, umbilical hernia repair.The surgical procedure has been discussed with the patient.  Potential risks, benefits, alternative treatments, and expected outcomes have been explained.  All of the patient's questions at this time have been answered.  The likelihood of reaching the patient's treatment goal is good.  The patient understand the proposed surgical procedure and wishes to proceed.     Cardiac clearance obtained.  Imogene Burn. Georgette Dover, MD, [facility]  General Surgery   04/17/2021 7:01 AM

## 2021-04-18 ENCOUNTER — Encounter (HOSPITAL_COMMUNITY): Payer: Self-pay | Admitting: Surgery

## 2021-04-18 DIAGNOSIS — K429 Umbilical hernia without obstruction or gangrene: Secondary | ICD-10-CM | POA: Diagnosis not present

## 2021-04-18 DIAGNOSIS — I129 Hypertensive chronic kidney disease with stage 1 through stage 4 chronic kidney disease, or unspecified chronic kidney disease: Secondary | ICD-10-CM | POA: Diagnosis not present

## 2021-04-18 DIAGNOSIS — Z8551 Personal history of malignant neoplasm of bladder: Secondary | ICD-10-CM | POA: Diagnosis not present

## 2021-04-18 DIAGNOSIS — E039 Hypothyroidism, unspecified: Secondary | ICD-10-CM | POA: Diagnosis not present

## 2021-04-18 DIAGNOSIS — K4090 Unilateral inguinal hernia, without obstruction or gangrene, not specified as recurrent: Secondary | ICD-10-CM | POA: Diagnosis not present

## 2021-04-18 DIAGNOSIS — N183 Chronic kidney disease, stage 3 unspecified: Secondary | ICD-10-CM | POA: Diagnosis not present

## 2021-04-18 MED ORDER — TRAMADOL HCL 50 MG PO TABS: 50.0000 mg | ORAL_TABLET | Freq: Four times a day (QID) | ORAL | 0 refills | Status: DC | PRN

## 2021-04-18 NOTE — Discharge Summary (Signed)
Physician Discharge Summary  Patient ID: Tanner Ho MRN: [id number] DOB/AGE: 80-Sep-1942 80 y.o.  Admit date: 04/17/2021 Discharge date: 04/18/2021  Admission Diagnoses:  Right inguinal hernia   Umbilical hernia  Discharge Diagnoses: Right inguinal hernia Umbilical hernia Active Problems:   Right inguinal hernia Umbilical hernia  Discharged Condition: good  Hospital Course: Open repair of RIH with mesh and primary umbilical hernia repair on 04/17/21.  He stayed overnight because of his medical comorbidities.  He is doing well with minimal pain.  Voiding.  No nausea or vomiting  Treatments: surgery: RIH repair with mesh/ umbilical hernia repair  Discharge Exam: Blood pressure (!) 154/67, pulse (!) 56, temperature 98 F (36.7 C), temperature source Oral, resp. rate 16, height 5\' 8"  (1.727 m), weight 68.5 kg, SpO2 99 %. General appearance: alert, cooperative, and no distress GI: soft, non-tender; bowel sounds normal; no masses,  no organomegaly Umbilical incision - dressing dry; minimal soreness Right groin - dressing dry; mild soreness; no testicular swelling  Disposition: Discharge disposition: 01-Home or Self Care       Discharge Instructions     Call MD for:  persistant nausea and vomiting   Complete by: As directed    Call MD for:  redness, tenderness, or signs of infection (pain, swelling, redness, odor or green/yellow discharge around incision site)   Complete by: As directed    Call MD for:  severe uncontrolled pain   Complete by: As directed    Call MD for:  temperature >100.4   Complete by: As directed    Diet general   Complete by: As directed    Driving Restrictions   Complete by: As directed    Do not drive while taking pain medications   Increase activity slowly   Complete by: As directed    May shower / Bathe   Complete by: As directed       Allergies as of 04/18/2021       Reactions   Ciprofloxacin Other (See Comments)   Cannot take due  to currently taking Amiodarone   Hytrin [terazosin] Other (See Comments)   Syncopal event         Medication List     TAKE these medications    acetaminophen 650 MG CR tablet Commonly known as: TYLENOL Take 650 mg by mouth at bedtime as needed for pain.   acetaminophen 325 MG tablet Commonly known as: TYLENOL Take 650 mg by mouth every 6 (six) hours as needed for moderate pain.   alfuzosin 10 MG 24 hr tablet Commonly known as: UROXATRAL Take 10 mg by mouth daily.   amiodarone 200 MG tablet Commonly known as: PACERONE Take 1 tablet (200 mg total) by mouth daily.   ascorbic acid 1000 MG tablet Commonly known as: VITAMIN C Take 1,000 mg by mouth daily.   chlorthalidone 25 MG tablet Commonly known as: HYGROTON Take 0.5 tablets (12.5 mg total) by mouth every other day.   docusate sodium 100 MG capsule Commonly known as: COLACE Take 100 mg by mouth 2 (two) times daily.   finasteride 5 MG tablet Commonly known as: PROSCAR Take 5 mg by mouth daily.   hydrocortisone 2.5 % cream Apply 1 application topically as needed (itching).   ketoconazole 2 % cream Commonly known as: NIZORAL Apply 1 application topically daily as needed for irritation.   levothyroxine 75 MCG tablet Commonly known as: SYNTHROID Take 75 mcg by mouth daily before breakfast.   linaclotide 145 MCG Caps capsule Commonly known  as: Linzess Take 1 capsule (145 mcg total) by mouth as needed.   loratadine 10 MG tablet Commonly known as: CLARITIN Take 10 mg by mouth daily.   losartan 25 MG tablet Commonly known as: COZAAR TAKE 1 TABLET BY MOUTH EVERY DAY   loteprednol 0.5 % ophthalmic suspension Commonly known as: LOTEMAX Place 1 drop into the right eye daily.   melatonin 3 MG Tabs tablet Take 3 mg by mouth at bedtime.   metoprolol tartrate 25 MG tablet Commonly known as: LOPRESSOR Take 0.5 tablets (12.5 mg total) by mouth 2 (two) times daily as needed (afib).   Polyethyl Glycol-Propyl  Glycol 0.4-0.3 % Soln Place 2 drops into both eyes 2 (two) times daily as needed (for dry eyes).   PreserVision AREDS Caps Take 1 capsule by mouth 2 (two) times daily.   rosuvastatin 5 MG tablet Commonly known as: CRESTOR TAKE 1 TABLET BY MOUTH DAILY AT 6 PM.   simethicone 125 MG chewable tablet Commonly known as: MYLICON Chew 481 mg by mouth every 6 (six) hours as needed for flatulence.   sodium chloride 0.65 % Soln nasal spray Commonly known as: OCEAN Place 1 spray into both nostrils as needed for congestion.   traMADol 50 MG tablet Commonly known as: ULTRAM Take 1 tablet (50 mg total) by mouth every 6 (six) hours as needed for moderate pain.   vitamin B-12 1000 MCG tablet Commonly known as: CYANOCOBALAMIN Take 1,000 mcg by mouth daily.   Vitamin D 50 MCG (2000 UT) tablet Take 2,000 Units by mouth daily.   Xarelto 15 MG Tabs tablet Generic drug: Rivaroxaban TAKE 1 TABLET (15 MG TOTAL) BY MOUTH DAILY WITH SUPPER. What changed: See the new instructions.        Follow-up Information     [name], MD Follow up.   Specialty: General Surgery Contact information: [address] [place] [address] [phone]                 Signed: Maia Petties 04/18/2021, 8:08 AM

## 2021-04-18 NOTE — Discharge Instructions (Signed)
[facility] _______[facility]  UMBILICAL OR INGUINAL HERNIA REPAIR: POST OP INSTRUCTIONS  Always review your discharge instruction sheet given to you by the facility where your surgery was performed. IF YOU HAVE DISABILITY OR FAMILY LEAVE FORMS, YOU MUST BRING THEM TO THE OFFICE FOR PROCESSING.   DO NOT GIVE THEM TO YOUR DOCTOR.  1. A  prescription for pain medication may be given to you upon discharge.  Take your pain medication as prescribed, if needed.  If narcotic pain medicine is not needed, then you may take acetaminophen (Tylenol) or ibuprofen (Advil) as needed. 2. Take your usually prescribed medications unless otherwise directed. If you need a refill on your pain medication, please contact your pharmacy.  They will contact our office to request authorization. Prescriptions will not be filled after 5 pm or on week-ends. 3. You should follow a light diet the first 24 hours after arrival home, such as soup and crackers, etc.  Be sure to include lots of fluids daily.  Resume your normal diet the day after surgery. 4.Most patients will experience some swelling and bruising around the umbilicus or in the groin and scrotum.  Ice packs and reclining will help.  Swelling and bruising can take several days to resolve.  6. It is common to experience some constipation if taking pain medication after surgery.  Increasing fluid intake and taking a stool softener (such as Colace) will usually help or prevent this problem from occurring.  A mild laxative (Milk of Magnesia or Miralax) should be taken according to package directions if there are no bowel movements after 48 hours. 7. Unless discharge instructions indicate otherwise, you may remove your bandages 24-48 hours after surgery, and you may shower at that time.  You may have steri-strips (small skin tapes) in place directly over the incision.  These strips should be left on the skin for 7-10 days.  If your surgeon used skin glue on the  incision, you may shower in 24 hours.  The glue will flake off over the next 2-3 weeks.  Any sutures or staples will be removed at the office during your follow-up visit. 8. ACTIVITIES:  You may resume regular (light) daily activities beginning the next day--such as daily self-care, walking, climbing stairs--gradually increasing activities as tolerated.  You may have sexual intercourse when it is comfortable.  Refrain from any heavy lifting or straining until approved by your doctor.  a.You may drive when you are no longer taking prescription pain medication, you can comfortably wear a seatbelt, and you can safely maneuver your car and apply brakes. b.RETURN TO WORK:   _____________________________________________  9.You should see your doctor in the office for a follow-up appointment approximately 2-3 weeks after your surgery.  Make sure that you call for this appointment within a day or two after you arrive home to insure a convenient appointment time. 10.OTHER INSTRUCTIONS: _________________________    _____________________________________  WHEN TO CALL YOUR DOCTOR: Fever over 101.0 Inability to urinate Nausea and/or vomiting Extreme swelling or bruising Continued bleeding from incision. Increased pain, redness, or drainage from the incision  The clinic staff is available to answer your questions during regular business hours.  Please don't hesitate to call and ask to speak to one of the nurses for clinical concerns.  If you have a medical emergency, go to the nearest emergency room or call 911.  A surgeon from [facility] is always on call at the hospital   [address], [address],   [place], [place]  [address] ?  [address] [address], [place], [place]   [address] [phone] ? [phone] ? FAX [phone] Web site: [email or web address]  

## 2021-04-18 NOTE — Plan of Care (Signed)

## 2021-04-18 NOTE — Progress Notes (Signed)
Patient given discharge instructions and stated understanding. 

## 2021-04-18 NOTE — Anesthesia Postprocedure Evaluation (Signed)
Anesthesia Post Note  Patient: Tanner Ho  Procedure(s) Performed: RIGHT INGUINAL HERNIA REPAIR WITH MESH (Right: Abdomen) UMBILICAL HERNIA REPAIR (Abdomen)     Patient location during evaluation: PACU Anesthesia Type: General Level of consciousness: awake and alert Pain management: pain level controlled Vital Signs Assessment: post-procedure vital signs reviewed and stable Respiratory status: spontaneous breathing, nonlabored ventilation and respiratory function stable Cardiovascular status: blood pressure returned to baseline and stable Postop Assessment: no apparent nausea or vomiting Anesthetic complications: no   No notable events documented.  Last Vitals:  Vitals:   04/17/21 2353 04/18/21 0414  BP: 134/66 (!) 154/67  Pulse: (!) 56 (!) 56  Resp:  16  Temp: 37 C 36.7 C  SpO2: 99% 99%    Last Pain:  Vitals:   04/18/21 0414  TempSrc: Oral  PainSc:                  Catalina Gravel

## 2021-04-30 ENCOUNTER — Encounter: Payer: Self-pay | Admitting: Cardiovascular Disease

## 2021-05-03 ENCOUNTER — Encounter (INDEPENDENT_AMBULATORY_CARE_PROVIDER_SITE_OTHER): Payer: Medicare Other | Admitting: Ophthalmology

## 2021-05-04 ENCOUNTER — Telehealth: Payer: Self-pay | Admitting: Cardiovascular Disease

## 2021-05-04 MED ORDER — AMIODARONE HCL 200 MG PO TABS: 200.0000 mg | ORAL_TABLET | Freq: Every day | ORAL | 1 refills | Status: DC

## 2021-05-04 NOTE — Telephone Encounter (Signed)
Called patient and informed him that is refill for Amiodarone 200 mg daily was sent to his requested pharmacy. He thanked me for calling and all (if any) questions were answered.

## 2021-05-04 NOTE — Telephone Encounter (Signed)
*  STAT* If patient is at the pharmacy, call can be transferred to refill team.   1. Which medications need to be refilled? (please list name of each medication and dose if known) amiodarone (PACERONE) 200 MG tablet  2. Which pharmacy/location (including street and city if local pharmacy) is medication to be sent to? [id number] [place], [place] - [address]  3. Do they need a 30 day or 90 day supply? 90 day supply  Pt has 1 more pill

## 2021-06-08 ENCOUNTER — Other Ambulatory Visit: Payer: Self-pay | Admitting: Cardiovascular Disease

## 2021-06-08 ENCOUNTER — Other Ambulatory Visit: Payer: Self-pay

## 2021-06-08 ENCOUNTER — Telehealth: Payer: Self-pay | Admitting: Cardiovascular Disease

## 2021-06-08 DIAGNOSIS — I10 Essential (primary) hypertension: Secondary | ICD-10-CM

## 2021-06-08 MED ORDER — CHLORTHALIDONE 25 MG PO TABS: 12.5000 mg | ORAL_TABLET | ORAL | 1 refills | Status: DC

## 2021-06-08 NOTE — Telephone Encounter (Signed)
Prescription refill request for Xarelto received.  Indication:Afib Last office visit:11/22 Weight:68.5 kg Age:81 Scr:1.5 CrCl:37.42 ml/min  Prescription refilled

## 2021-06-08 NOTE — Telephone Encounter (Signed)
°*  STAT* If patient is at the pharmacy, call can be transferred to refill team.   1. Which medications need to be refilled? (please list name of each medication and dose if known)  chlorthalidone (HYGROTON) 25 MG tablet  2. Which pharmacy/location (including street and city if local pharmacy) is medication to be sent to? [id number] [place], [place] - [place]  3. Do they need a 30 day or 90 day supply? 90 DAY REFILL

## 2021-06-11 DIAGNOSIS — M79675 Pain in left toe(s): Secondary | ICD-10-CM | POA: Diagnosis not present

## 2021-06-11 DIAGNOSIS — M79674 Pain in right toe(s): Secondary | ICD-10-CM | POA: Diagnosis not present

## 2021-06-11 DIAGNOSIS — L603 Nail dystrophy: Secondary | ICD-10-CM | POA: Diagnosis not present

## 2021-06-11 DIAGNOSIS — B351 Tinea unguium: Secondary | ICD-10-CM | POA: Diagnosis not present

## 2021-06-11 DIAGNOSIS — I70203 Unspecified atherosclerosis of native arteries of extremities, bilateral legs: Secondary | ICD-10-CM | POA: Diagnosis not present

## 2021-06-28 NOTE — Telephone Encounter (Signed)
RX sent to pharmacy  

## 2021-07-03 ENCOUNTER — Other Ambulatory Visit: Payer: Self-pay | Admitting: Cardiovascular Disease

## 2021-07-03 DIAGNOSIS — I10 Essential (primary) hypertension: Secondary | ICD-10-CM

## 2021-07-20 DIAGNOSIS — C44229 Squamous cell carcinoma of skin of left ear and external auricular canal: Secondary | ICD-10-CM | POA: Diagnosis not present

## 2021-07-20 DIAGNOSIS — Z08 Encounter for follow-up examination after completed treatment for malignant neoplasm: Secondary | ICD-10-CM | POA: Diagnosis not present

## 2021-07-20 DIAGNOSIS — R229 Localized swelling, mass and lump, unspecified: Secondary | ICD-10-CM | POA: Diagnosis not present

## 2021-07-20 DIAGNOSIS — L821 Other seborrheic keratosis: Secondary | ICD-10-CM | POA: Diagnosis not present

## 2021-07-20 DIAGNOSIS — L570 Actinic keratosis: Secondary | ICD-10-CM | POA: Diagnosis not present

## 2021-07-20 DIAGNOSIS — D1801 Hemangioma of skin and subcutaneous tissue: Secondary | ICD-10-CM | POA: Diagnosis not present

## 2021-07-20 DIAGNOSIS — Z85828 Personal history of other malignant neoplasm of skin: Secondary | ICD-10-CM | POA: Diagnosis not present

## 2021-07-26 DIAGNOSIS — N139 Obstructive and reflux uropathy, unspecified: Secondary | ICD-10-CM | POA: Diagnosis not present

## 2021-07-26 DIAGNOSIS — R3914 Feeling of incomplete bladder emptying: Secondary | ICD-10-CM | POA: Diagnosis not present

## 2021-07-26 DIAGNOSIS — N401 Enlarged prostate with lower urinary tract symptoms: Secondary | ICD-10-CM | POA: Diagnosis not present

## 2021-08-13 DIAGNOSIS — U071 COVID-19: Secondary | ICD-10-CM | POA: Diagnosis not present

## 2021-08-15 DIAGNOSIS — H353134 Nonexudative age-related macular degeneration, bilateral, advanced atrophic with subfoveal involvement: Secondary | ICD-10-CM | POA: Diagnosis not present

## 2021-08-15 DIAGNOSIS — H0100A Unspecified blepharitis right eye, upper and lower eyelids: Secondary | ICD-10-CM | POA: Diagnosis not present

## 2021-08-15 DIAGNOSIS — H04123 Dry eye syndrome of bilateral lacrimal glands: Secondary | ICD-10-CM | POA: Diagnosis not present

## 2021-08-15 DIAGNOSIS — H524 Presbyopia: Secondary | ICD-10-CM | POA: Diagnosis not present

## 2021-08-27 DIAGNOSIS — I70203 Unspecified atherosclerosis of native arteries of extremities, bilateral legs: Secondary | ICD-10-CM | POA: Diagnosis not present

## 2021-08-27 DIAGNOSIS — M79675 Pain in left toe(s): Secondary | ICD-10-CM | POA: Diagnosis not present

## 2021-08-27 DIAGNOSIS — B351 Tinea unguium: Secondary | ICD-10-CM | POA: Diagnosis not present

## 2021-08-27 DIAGNOSIS — M79674 Pain in right toe(s): Secondary | ICD-10-CM | POA: Diagnosis not present

## 2021-08-27 DIAGNOSIS — L603 Nail dystrophy: Secondary | ICD-10-CM | POA: Diagnosis not present

## 2021-09-09 ENCOUNTER — Other Ambulatory Visit: Payer: Self-pay | Admitting: Cardiovascular Disease

## 2021-09-30 ENCOUNTER — Other Ambulatory Visit: Payer: Self-pay | Admitting: Cardiovascular Disease

## 2021-10-12 ENCOUNTER — Telehealth: Payer: Self-pay | Admitting: Cardiovascular Disease

## 2021-10-12 NOTE — Telephone Encounter (Signed)
?*  STAT* If patient is at the pharmacy, call can be transferred to refill team. ? ? ?1. Which medications need to be refilled? (please list name of each medication and dose if known)  ?rosuvastatin (CRESTOR) 5 MG tablet ? ?2. Which pharmacy/location (including street and city if local pharmacy) is medication to be sent to? ?[facility] [address] [place], [place]. AT [place] ? ?3. Do they need a 30 day or 90 day supply? 30  ? ? ?[facility] does not have any in stock. Please send 30 day supply to [facility]  ?

## 2021-10-15 MED ORDER — ROSUVASTATIN CALCIUM 5 MG PO TABS: ORAL_TABLET | ORAL | 0 refills | Status: DC

## 2021-10-15 NOTE — Telephone Encounter (Signed)
A 30 day supply of Rosuvastatin (Crestor) sent to [facility]. ?

## 2021-10-18 DIAGNOSIS — I10 Essential (primary) hypertension: Secondary | ICD-10-CM | POA: Diagnosis not present

## 2021-10-18 DIAGNOSIS — E039 Hypothyroidism, unspecified: Secondary | ICD-10-CM | POA: Diagnosis not present

## 2021-10-18 DIAGNOSIS — E785 Hyperlipidemia, unspecified: Secondary | ICD-10-CM | POA: Diagnosis not present

## 2021-10-22 ENCOUNTER — Other Ambulatory Visit: Payer: Self-pay | Admitting: Cardiovascular Disease

## 2021-10-22 DIAGNOSIS — N1832 Chronic kidney disease, stage 3b: Secondary | ICD-10-CM | POA: Diagnosis not present

## 2021-10-22 DIAGNOSIS — H6123 Impacted cerumen, bilateral: Secondary | ICD-10-CM | POA: Diagnosis not present

## 2021-10-22 DIAGNOSIS — E039 Hypothyroidism, unspecified: Secondary | ICD-10-CM | POA: Diagnosis not present

## 2021-10-22 DIAGNOSIS — M8580 Other specified disorders of bone density and structure, unspecified site: Secondary | ICD-10-CM | POA: Diagnosis not present

## 2021-10-22 DIAGNOSIS — I480 Paroxysmal atrial fibrillation: Secondary | ICD-10-CM | POA: Diagnosis not present

## 2021-10-22 DIAGNOSIS — D6869 Other thrombophilia: Secondary | ICD-10-CM | POA: Diagnosis not present

## 2021-10-22 DIAGNOSIS — N401 Enlarged prostate with lower urinary tract symptoms: Secondary | ICD-10-CM | POA: Diagnosis not present

## 2021-10-22 DIAGNOSIS — I10 Essential (primary) hypertension: Secondary | ICD-10-CM | POA: Diagnosis not present

## 2021-10-22 DIAGNOSIS — K5900 Constipation, unspecified: Secondary | ICD-10-CM | POA: Diagnosis not present

## 2021-10-22 DIAGNOSIS — D72819 Decreased white blood cell count, unspecified: Secondary | ICD-10-CM | POA: Diagnosis not present

## 2021-10-22 DIAGNOSIS — E7800 Pure hypercholesterolemia, unspecified: Secondary | ICD-10-CM | POA: Diagnosis not present

## 2021-10-22 DIAGNOSIS — Z0000 Encounter for general adult medical examination without abnormal findings: Secondary | ICD-10-CM | POA: Diagnosis not present

## 2021-11-06 ENCOUNTER — Other Ambulatory Visit: Payer: Self-pay | Admitting: Cardiovascular Disease

## 2021-11-06 DIAGNOSIS — I10 Essential (primary) hypertension: Secondary | ICD-10-CM

## 2021-11-27 ENCOUNTER — Ambulatory Visit (INDEPENDENT_AMBULATORY_CARE_PROVIDER_SITE_OTHER): Payer: Medicare Other | Admitting: Cardiovascular Disease

## 2021-11-27 ENCOUNTER — Encounter: Payer: Self-pay | Admitting: Cardiovascular Disease

## 2021-11-27 DIAGNOSIS — N1832 Chronic kidney disease, stage 3b: Secondary | ICD-10-CM | POA: Diagnosis not present

## 2021-11-27 DIAGNOSIS — Z7901 Long term (current) use of anticoagulants: Secondary | ICD-10-CM

## 2021-11-27 DIAGNOSIS — Z5181 Encounter for therapeutic drug level monitoring: Secondary | ICD-10-CM | POA: Diagnosis not present

## 2021-11-27 DIAGNOSIS — I10 Essential (primary) hypertension: Secondary | ICD-10-CM

## 2021-11-27 DIAGNOSIS — I480 Paroxysmal atrial fibrillation: Secondary | ICD-10-CM

## 2021-11-27 DIAGNOSIS — Z79899 Other long term (current) drug therapy: Secondary | ICD-10-CM

## 2021-11-28 DIAGNOSIS — L603 Nail dystrophy: Secondary | ICD-10-CM | POA: Diagnosis not present

## 2021-11-28 DIAGNOSIS — L84 Corns and callosities: Secondary | ICD-10-CM | POA: Diagnosis not present

## 2021-11-28 DIAGNOSIS — M79674 Pain in right toe(s): Secondary | ICD-10-CM | POA: Diagnosis not present

## 2021-11-28 DIAGNOSIS — M79675 Pain in left toe(s): Secondary | ICD-10-CM | POA: Diagnosis not present

## 2021-11-28 DIAGNOSIS — I70203 Unspecified atherosclerosis of native arteries of extremities, bilateral legs: Secondary | ICD-10-CM | POA: Diagnosis not present

## 2021-11-28 DIAGNOSIS — B351 Tinea unguium: Secondary | ICD-10-CM | POA: Diagnosis not present

## 2021-12-28 ENCOUNTER — Other Ambulatory Visit: Payer: Self-pay | Admitting: Cardiovascular Disease

## 2021-12-28 DIAGNOSIS — I480 Paroxysmal atrial fibrillation: Secondary | ICD-10-CM

## 2021-12-28 NOTE — Telephone Encounter (Signed)
Prescription refill request for Xarelto received.  Indication: Afib  Last office visit: 11/27/21 Claiborne Billings)  Weight: 72.1kg Age: 82 Scr: 1.55 (04/13/21) CrCl: 38.88m/min  Appropriate dose and refill sent to requested pharmacy.

## 2022-01-29 DIAGNOSIS — I10 Essential (primary) hypertension: Secondary | ICD-10-CM | POA: Diagnosis not present

## 2022-01-29 DIAGNOSIS — M8580 Other specified disorders of bone density and structure, unspecified site: Secondary | ICD-10-CM | POA: Diagnosis not present

## 2022-01-29 DIAGNOSIS — E7800 Pure hypercholesterolemia, unspecified: Secondary | ICD-10-CM | POA: Diagnosis not present

## 2022-01-29 DIAGNOSIS — E039 Hypothyroidism, unspecified: Secondary | ICD-10-CM | POA: Diagnosis not present

## 2022-01-29 DIAGNOSIS — I482 Chronic atrial fibrillation, unspecified: Secondary | ICD-10-CM | POA: Diagnosis not present

## 2022-03-01 DIAGNOSIS — Z23 Encounter for immunization: Secondary | ICD-10-CM | POA: Diagnosis not present

## 2022-03-05 DIAGNOSIS — L298 Other pruritus: Secondary | ICD-10-CM | POA: Diagnosis not present

## 2022-03-05 DIAGNOSIS — C44329 Squamous cell carcinoma of skin of other parts of face: Secondary | ICD-10-CM | POA: Diagnosis not present

## 2022-03-05 DIAGNOSIS — L538 Other specified erythematous conditions: Secondary | ICD-10-CM | POA: Diagnosis not present

## 2022-03-05 DIAGNOSIS — D230 Other benign neoplasm of skin of lip: Secondary | ICD-10-CM | POA: Diagnosis not present

## 2022-03-05 DIAGNOSIS — R208 Other disturbances of skin sensation: Secondary | ICD-10-CM | POA: Diagnosis not present

## 2022-03-05 DIAGNOSIS — L988 Other specified disorders of the skin and subcutaneous tissue: Secondary | ICD-10-CM | POA: Diagnosis not present

## 2022-03-05 DIAGNOSIS — D485 Neoplasm of uncertain behavior of skin: Secondary | ICD-10-CM | POA: Diagnosis not present

## 2022-03-05 DIAGNOSIS — L820 Inflamed seborrheic keratosis: Secondary | ICD-10-CM | POA: Diagnosis not present

## 2022-03-05 DIAGNOSIS — L570 Actinic keratosis: Secondary | ICD-10-CM | POA: Diagnosis not present

## 2022-03-05 DIAGNOSIS — Z789 Other specified health status: Secondary | ICD-10-CM | POA: Diagnosis not present

## 2022-03-05 DIAGNOSIS — L218 Other seborrheic dermatitis: Secondary | ICD-10-CM | POA: Diagnosis not present

## 2022-03-06 DIAGNOSIS — L84 Corns and callosities: Secondary | ICD-10-CM | POA: Diagnosis not present

## 2022-03-06 DIAGNOSIS — L603 Nail dystrophy: Secondary | ICD-10-CM | POA: Diagnosis not present

## 2022-03-06 DIAGNOSIS — M79675 Pain in left toe(s): Secondary | ICD-10-CM | POA: Diagnosis not present

## 2022-03-06 DIAGNOSIS — M79674 Pain in right toe(s): Secondary | ICD-10-CM | POA: Diagnosis not present

## 2022-03-06 DIAGNOSIS — B351 Tinea unguium: Secondary | ICD-10-CM | POA: Diagnosis not present

## 2022-03-06 DIAGNOSIS — I70203 Unspecified atherosclerosis of native arteries of extremities, bilateral legs: Secondary | ICD-10-CM | POA: Diagnosis not present

## 2022-03-10 ENCOUNTER — Other Ambulatory Visit: Payer: Self-pay | Admitting: Cardiovascular Disease

## 2022-04-02 DIAGNOSIS — D485 Neoplasm of uncertain behavior of skin: Secondary | ICD-10-CM | POA: Diagnosis not present

## 2022-04-02 DIAGNOSIS — L821 Other seborrheic keratosis: Secondary | ICD-10-CM | POA: Diagnosis not present

## 2022-04-02 DIAGNOSIS — L981 Factitial dermatitis: Secondary | ICD-10-CM | POA: Diagnosis not present

## 2022-04-02 DIAGNOSIS — C44329 Squamous cell carcinoma of skin of other parts of face: Secondary | ICD-10-CM | POA: Diagnosis not present

## 2022-04-04 ENCOUNTER — Encounter (INDEPENDENT_AMBULATORY_CARE_PROVIDER_SITE_OTHER): Payer: Medicare Other | Admitting: Ophthalmology

## 2022-04-05 ENCOUNTER — Emergency Department (HOSPITAL_BASED_OUTPATIENT_CLINIC_OR_DEPARTMENT_OTHER)
Admission: EM | Admit: 2022-04-05 | Discharge: 2022-04-06 | Disposition: A | Discharge status: Home / Self Care | Payer: Medicare Other | Attending: Emergency Medicine | Admitting: Emergency Medicine

## 2022-04-05 ENCOUNTER — Encounter (HOSPITAL_BASED_OUTPATIENT_CLINIC_OR_DEPARTMENT_OTHER): Payer: Self-pay

## 2022-04-05 ENCOUNTER — Emergency Department (HOSPITAL_BASED_OUTPATIENT_CLINIC_OR_DEPARTMENT_OTHER): Payer: Medicare Other | Admitting: Radiology

## 2022-04-05 ENCOUNTER — Other Ambulatory Visit: Payer: Self-pay

## 2022-04-05 DIAGNOSIS — E876 Hypokalemia: Secondary | ICD-10-CM | POA: Insufficient documentation

## 2022-04-05 DIAGNOSIS — Z7901 Long term (current) use of anticoagulants: Secondary | ICD-10-CM | POA: Diagnosis not present

## 2022-04-05 DIAGNOSIS — J45909 Unspecified asthma, uncomplicated: Secondary | ICD-10-CM | POA: Insufficient documentation

## 2022-04-05 DIAGNOSIS — R059 Cough, unspecified: Secondary | ICD-10-CM | POA: Diagnosis not present

## 2022-04-05 DIAGNOSIS — Z79899 Other long term (current) drug therapy: Secondary | ICD-10-CM | POA: Diagnosis not present

## 2022-04-05 DIAGNOSIS — N189 Chronic kidney disease, unspecified: Secondary | ICD-10-CM | POA: Diagnosis not present

## 2022-04-05 DIAGNOSIS — I129 Hypertensive chronic kidney disease with stage 1 through stage 4 chronic kidney disease, or unspecified chronic kidney disease: Secondary | ICD-10-CM | POA: Diagnosis not present

## 2022-04-05 DIAGNOSIS — Z20822 Contact with and (suspected) exposure to covid-19: Secondary | ICD-10-CM | POA: Diagnosis not present

## 2022-04-05 DIAGNOSIS — J069 Acute upper respiratory infection, unspecified: Secondary | ICD-10-CM | POA: Insufficient documentation

## 2022-04-05 DIAGNOSIS — J90 Pleural effusion, not elsewhere classified: Secondary | ICD-10-CM | POA: Diagnosis not present

## 2022-04-05 LAB — COMPREHENSIVE METABOLIC PANEL
ALT: 10 U/L (ref 0–44)
AST: 18 U/L (ref 15–41)
Albumin: 3.8 g/dL (ref 3.5–5.0)
Alkaline Phosphatase: 86 U/L (ref 38–126)
Anion gap: 11 (ref 5–15)
BUN: 34 mg/dL — ABNORMAL HIGH (ref 8–23)
CO2: 24 mmol/L (ref 22–32)
Calcium: 8.5 mg/dL — ABNORMAL LOW (ref 8.9–10.3)
Chloride: 100 mmol/L (ref 98–111)
Creatinine, Ser: 1.93 mg/dL — ABNORMAL HIGH (ref 0.61–1.24)
GFR, Estimated: 34 mL/min — ABNORMAL LOW (ref >60)
Glucose, Bld: 104 mg/dL — ABNORMAL HIGH (ref 70–99)
Potassium: 3.3 mmol/L — ABNORMAL LOW (ref 3.5–5.1)
Sodium: 135 mmol/L (ref 135–145)
Total Bilirubin: 0.4 mg/dL (ref 0.3–1.2)
Total Protein: 6.6 g/dL (ref 6.5–8.1)

## 2022-04-05 LAB — CBC WITH DIFFERENTIAL/PLATELET
Abs Immature Granulocytes: 0.02 10*3/uL (ref 0.00–0.07)
Basophils Absolute: 0 10*3/uL (ref 0.0–0.1)
Basophils Relative: 0 %
Eosinophils Absolute: 0.1 10*3/uL (ref 0.0–0.5)
Eosinophils Relative: 2 %
HCT: 36.3 % — ABNORMAL LOW (ref 39.0–52.0)
Hemoglobin: 12.3 g/dL — ABNORMAL LOW (ref 13.0–17.0)
Immature Granulocytes: 0 %
Lymphocytes Relative: 27 %
Lymphs Abs: 1.3 10*3/uL (ref 0.7–4.0)
MCH: 35.8 pg — ABNORMAL HIGH (ref 26.0–34.0)
MCHC: 33.9 g/dL (ref 30.0–36.0)
MCV: 105.5 fL — ABNORMAL HIGH (ref 80.0–100.0)
Monocytes Absolute: 0.9 10*3/uL (ref 0.1–1.0)
Monocytes Relative: 18 %
Neutro Abs: 2.5 10*3/uL (ref 1.7–7.7)
Neutrophils Relative %: 53 %
Platelets: 126 10*3/uL — ABNORMAL LOW (ref 150–400)
RBC: 3.44 MIL/uL — ABNORMAL LOW (ref 4.22–5.81)
RDW: 12.8 % (ref 11.5–15.5)
WBC: 4.8 10*3/uL (ref 4.0–10.5)
nRBC: 0 % (ref 0.0–0.2)

## 2022-04-05 LAB — RESP PANEL BY RT-PCR (FLU A&B, COVID) ARPGX2
Influenza A by PCR: NEGATIVE
Influenza B by PCR: NEGATIVE
SARS Coronavirus 2 by RT PCR: NEGATIVE

## 2022-04-05 NOTE — ED Triage Notes (Addendum)
Patient here POV from Home.  Endorses Cough that began Monday. Associated with Fever, Aches, Nasal Congestion, Diarrhea. Present and worsening since.   NAD Noted During Triage. A&Ox4. GCS 15. BIB Personal Wheelchair.

## 2022-04-06 DIAGNOSIS — J069 Acute upper respiratory infection, unspecified: Secondary | ICD-10-CM | POA: Diagnosis not present

## 2022-04-06 MED ORDER — BENZONATATE 100 MG PO CAPS: 100.0000 mg | ORAL_CAPSULE | Freq: Three times a day (TID) | ORAL | 0 refills | Status: DC

## 2022-04-06 MED ORDER — AMOXICILLIN 500 MG PO CAPS: 1000.0000 mg | ORAL_CAPSULE | Freq: Two times a day (BID) | ORAL | 0 refills | Status: DC

## 2022-04-06 MED ORDER — IPRATROPIUM-ALBUTEROL 0.5-2.5 (3) MG/3ML IN SOLN
3.0000 mL | Freq: Once | RESPIRATORY_TRACT | Status: AC
  Administered 2022-04-06: 3 mL via RESPIRATORY_TRACT
  Filled 2022-04-06: qty 3

## 2022-04-06 MED ORDER — BENZONATATE 100 MG PO CAPS
100.0000 mg | ORAL_CAPSULE | Freq: Once | ORAL | Status: AC
  Administered 2022-04-06: 100 mg via ORAL
  Filled 2022-04-06: qty 1

## 2022-04-06 MED ORDER — AMOXICILLIN 500 MG PO CAPS
1000.0000 mg | ORAL_CAPSULE | Freq: Once | ORAL | Status: AC
  Administered 2022-04-06: 1000 mg via ORAL
  Filled 2022-04-06: qty 2

## 2022-04-06 MED ORDER — POTASSIUM CHLORIDE CRYS ER 20 MEQ PO TBCR: 20.0000 meq | EXTENDED_RELEASE_TABLET | Freq: Two times a day (BID) | ORAL | 0 refills | Status: DC

## 2022-04-06 MED ORDER — POTASSIUM CHLORIDE CRYS ER 20 MEQ PO TBCR
40.0000 meq | EXTENDED_RELEASE_TABLET | Freq: Once | ORAL | Status: AC
  Administered 2022-04-06: 40 meq via ORAL
  Filled 2022-04-06: qty 2

## 2022-04-06 NOTE — ED Provider Notes (Signed)
[facility] EMERGENCY DEPT Provider Note   CSN: [id number] Arrival date & time: 04/05/22  2002     History  Chief Complaint  Patient presents with   Cough    Tanner Ho is a 81 y.o. male.  The history is provided by the patient and the spouse.  Cough He has history of hypertension, hyperlipidemia, paroxysmal atrial fibrillation anticoagulated on rivaroxaban, chronic kidney disease, asthma and comes in with 5-day history of cough which is productive of small amount of tan sputum.  He has had low-grade fevers, not higher than 100 F.  He denies chills or sweats.  He did initially have some body aches, but those have resolved.  He does complain of some clear rhinorrhea.  He denies any sick contacts.   Home Medications Prior to Admission medications   Medication Sig Start Date End Date Taking? Authorizing Provider  acetaminophen (TYLENOL) 325 MG tablet Take 650 mg by mouth every 6 (six) hours as needed for moderate pain.    [provider]  acetaminophen (TYLENOL) 650 MG CR tablet Take 650 mg by mouth at bedtime as needed for pain.    [provider]  alfuzosin (UROXATRAL) 10 MG 24 hr tablet Take 10 mg by mouth daily.    [provider]  amiodarone (PACERONE) 200 MG tablet Take 1 tablet (200 mg total) by mouth daily. 10/22/21   Troy Sine, MD  ascorbic acid (VITAMIN C) 1000 MG tablet Take 1,000 mg by mouth daily.    [provider]  chlorthalidone (HYGROTON) 25 MG tablet TAKE 1/2 TAB BY MOUTH EVERY OTHER DAY 11/07/21   Troy Sine, MD  Cholecalciferol (VITAMIN D) 50 MCG (2000 UT) tablet Take 2,000 Units by mouth daily.    [provider]  docusate sodium (COLACE) 100 MG capsule Take 100 mg by mouth 2 (two) times daily.    [provider]  finasteride (PROSCAR) 5 MG tablet Take 5 mg by mouth daily.    [provider]  hydrocortisone 2.5 % cream Apply 1 application topically as needed (itching). 08/12/19    [provider]  ketoconazole (NIZORAL) 2 % cream Apply 1 application topically daily as needed for irritation.    [provider]  levothyroxine (SYNTHROID) 75 MCG tablet Take 75 mcg by mouth daily before breakfast. 04/22/14   [provider]  linaclotide (LINZESS) 145 MCG CAPS capsule Take 1 capsule (145 mcg total) by mouth as needed. 06/23/20   Zehr, Laban Emperor, PA-C  loratadine (CLARITIN) 10 MG tablet Take 10 mg by mouth daily.    [provider]  losartan (COZAAR) 25 MG tablet TAKE 1 TABLET BY MOUTH EVERY DAY 03/11/22   Troy Sine, MD  loteprednol (LOTEMAX) 0.5 % ophthalmic suspension Place 1 drop into the right eye daily. 10/05/20 10/05/21  Rankin, Clent Demark, MD  melatonin 3 MG TABS tablet Take 3 mg by mouth at bedtime.    [provider]  metoprolol tartrate (LOPRESSOR) 25 MG tablet Take 0.5 tablets (12.5 mg total) by mouth 2 (two) times daily as needed (afib). 02/14/21   Troy Sine, MD  Multiple Vitamins-Minerals (PRESERVISION AREDS) CAPS Take 1 capsule by mouth 2 (two) times daily.    [provider]  Polyethyl Glycol-Propyl Glycol 0.4-0.3 % SOLN Place 2 drops into both eyes 2 (two) times daily as needed (for dry eyes).    [provider]  polyethylene glycol powder (MIRALAX) 17 GM/SCOOP powder See admin instructions.  [provider]  Psyllium (METAMUCIL FIBER SINGLES PO)     [provider]  rosuvastatin (CRESTOR) 5 MG tablet TAKE 1 TABLET BY MOUTH DAILY AT 6 PM. 12/28/21   Troy Sine, MD  simethicone (MYLICON) 287 MG chewable tablet Chew 125 mg by mouth every 6 (six) hours as needed for flatulence.    [provider]  sodium chloride (OCEAN) 0.65 % SOLN nasal spray Place 1 spray into both nostrils as needed for congestion.    [provider]  traMADol (ULTRAM) 50 MG tablet Take 1 tablet (50 mg total) by mouth every 6 (six) hours as needed for moderate pain. 04/18/21   [name],  MD  vitamin B-12 (CYANOCOBALAMIN) 1000 MCG tablet Take 1,000 mcg by mouth daily.    [provider]  XARELTO 15 MG TABS tablet TAKE 1 TABLET BY MOUTH DAILY WITH SUPPER 12/28/21   Troy Sine, MD      Allergies    Ciprofloxacin; Hytrin [terazosin]; Antihistamines, chlorpheniramine-type; Doxycycline; and Pseudoephedrine    Review of Systems   Review of Systems  Respiratory:  Positive for cough.   All other systems reviewed and are negative.   Physical Exam Updated Vital Signs BP (!) 169/66   Pulse 66   Temp 98.9 F (37.2 C)   Resp 18   Ht '5\' 8"'$  (1.727 m)   Wt 72.1 kg   SpO2 96%   BMI 24.17 kg/m  Physical Exam Vitals and nursing note reviewed.   81 year old male, resting comfortably and in no acute distress. Vital signs are significant for elevated blood pressure. Oxygen saturation is 96%, which is normal. Head is normocephalic and atraumatic. PERRLA, EOMI. Oropharynx is clear. Neck is nontender and supple without adenopathy or JVD. Back is nontender and there is no CVA tenderness. Lungs are clear without rales, wheezes, or rhonchi.  When coughing, expiratory wheezes are noted. Chest is nontender. Heart has regular rate and rhythm without murmur. Abdomen is soft, flat, nontender. Extremities have no cyanosis or edema, full range of motion is present.  Moderate venous stasis changes are present bilaterally. Skin is warm and dry without rash. Neurologic: Mental status is normal, cranial nerves are intact, moves all extremities equally.  ED Results / Procedures / Treatments   Labs (all labs ordered are listed, but only abnormal results are displayed) Labs Reviewed  CBC WITH DIFFERENTIAL/PLATELET - Abnormal; Notable for the following components:      Result Value   RBC 3.44 (*)    Hemoglobin 12.3 (*)    HCT 36.3 (*)    MCV 105.5 (*)    MCH 35.8 (*)    Platelets 126 (*)    All other components within normal limits  COMPREHENSIVE METABOLIC PANEL - Abnormal;  Notable for the following components:   Potassium 3.3 (*)    Glucose, Bld 104 (*)    BUN 34 (*)    Creatinine, Ser 1.93 (*)    Calcium 8.5 (*)    GFR, Estimated 34 (*)    All other components within normal limits  RESP PANEL BY RT-PCR (FLU A&B, COVID) [id number]    EKG None  Radiology DG Chest 2 View  Result Date: 04/05/2022 CLINICAL DATA:  Productive cough EXAM: CHEST - 2 VIEW COMPARISON:  05/18/2013 FINDINGS: Transverse diameter of heart is increased. There are no signs of pulmonary edema or focal pulmonary consolidation. There is blunting of posterior costophrenic angles suggesting small effusions. There is no pneumothorax. Patient's chin is partly  obscuring the apices. IMPRESSION: Cardiomegaly. There are no signs of alveolar pulmonary edema or focal pulmonary consolidation. Small bilateral pleural effusions. Electronically Signed   By: Elmer Picker M.D.   On: 04/05/2022 20:36    Procedures Procedures    Medications Ordered in ED Medications  ipratropium-albuterol (DUONEB) 0.5-2.5 (3) MG/3ML nebulizer solution 3 mL (has no administration in time range)  potassium chloride SA (KLOR-CON M) CR tablet 40 mEq (has no administration in time range)    ED Course/ Medical Decision Making/ A&P                           Medical Decision Making Risk Prescription drug management.   Respiratory tract infection, probably viral but need to consider pneumonia.  Viral etiology is include influenza, COVID-19, RSV, other viruses.  Chest x-ray shows no evidence of pneumonia but small bilateral pleural effusions present.  I have independently viewed the images, and agree with radiologist interpretation.  I have reviewed and interpreted his laboratory tests, and my interpretation is stable macrocytic anemia, mild thrombocytopenia which is not clinically significant mild hypokalemia which is likely secondary to diuretic use, renal insufficiency slightly worse than last reported value 1 year ago,  but in a range that he has been at previously.  Respiratory pathogen panel shows no evidence of influenza or COVID-19.  No indication for antibiotics or antiviral medication.  I have ordered a therapeutic trial of albuterol with ipratropium via nebulizer.  Because of hypokalemia, I have also ordered a dose of oral potassium.  He noted no improvement with albuterol with ipratropium.  His wife is very concerned because she now is stating that the sputum is green and she feels that he needs an antibiotic.  I have ordered a dose of amoxicillin and also benzonatate and I am discharging him with prescriptions for amoxicillin, benzonatate, oral potassium.  Final Clinical Impression(s) / ED Diagnoses Final diagnoses:  URI with cough and congestion  Chronic anticoagulation  Hypokalemia    Rx / DC Orders ED Discharge Orders          Ordered    benzonatate (TESSALON) 100 MG capsule  Every 8 hours        04/06/22 0053    amoxicillin (AMOXIL) 500 MG capsule  2 times daily        04/06/22 0053    potassium chloride SA (KLOR-CON M) 20 MEQ tablet  2 times daily        04/06/22 0240              Delora Fuel, MD 97/35/32 [phone]

## 2022-04-09 DIAGNOSIS — R062 Wheezing: Secondary | ICD-10-CM | POA: Diagnosis not present

## 2022-04-09 DIAGNOSIS — E876 Hypokalemia: Secondary | ICD-10-CM | POA: Diagnosis not present

## 2022-04-09 DIAGNOSIS — J40 Bronchitis, not specified as acute or chronic: Secondary | ICD-10-CM | POA: Diagnosis not present

## 2022-04-17 DIAGNOSIS — H43812 Vitreous degeneration, left eye: Secondary | ICD-10-CM | POA: Diagnosis not present

## 2022-04-17 DIAGNOSIS — H353134 Nonexudative age-related macular degeneration, bilateral, advanced atrophic with subfoveal involvement: Secondary | ICD-10-CM | POA: Diagnosis not present

## 2022-05-09 DIAGNOSIS — D0439 Carcinoma in situ of skin of other parts of face: Secondary | ICD-10-CM | POA: Diagnosis not present

## 2022-05-09 DIAGNOSIS — L821 Other seborrheic keratosis: Secondary | ICD-10-CM | POA: Diagnosis not present

## 2022-05-15 DIAGNOSIS — H0100B Unspecified blepharitis left eye, upper and lower eyelids: Secondary | ICD-10-CM | POA: Diagnosis not present

## 2022-05-15 DIAGNOSIS — H04122 Dry eye syndrome of left lacrimal gland: Secondary | ICD-10-CM | POA: Diagnosis not present

## 2022-05-16 DIAGNOSIS — L84 Corns and callosities: Secondary | ICD-10-CM | POA: Diagnosis not present

## 2022-05-16 DIAGNOSIS — M79675 Pain in left toe(s): Secondary | ICD-10-CM | POA: Diagnosis not present

## 2022-05-16 DIAGNOSIS — B351 Tinea unguium: Secondary | ICD-10-CM | POA: Diagnosis not present

## 2022-05-16 DIAGNOSIS — L603 Nail dystrophy: Secondary | ICD-10-CM | POA: Diagnosis not present

## 2022-05-16 DIAGNOSIS — M79674 Pain in right toe(s): Secondary | ICD-10-CM | POA: Diagnosis not present

## 2022-05-16 DIAGNOSIS — I70203 Unspecified atherosclerosis of native arteries of extremities, bilateral legs: Secondary | ICD-10-CM | POA: Diagnosis not present

## 2022-05-20 DIAGNOSIS — Z23 Encounter for immunization: Secondary | ICD-10-CM | POA: Diagnosis not present

## 2022-06-07 ENCOUNTER — Other Ambulatory Visit: Payer: Self-pay | Admitting: Cardiovascular Disease

## 2022-06-13 DIAGNOSIS — H6123 Impacted cerumen, bilateral: Secondary | ICD-10-CM | POA: Diagnosis not present

## 2022-06-24 ENCOUNTER — Other Ambulatory Visit: Payer: Self-pay | Admitting: Gastroenterology

## 2022-06-28 ENCOUNTER — Other Ambulatory Visit: Payer: Self-pay | Admitting: Cardiovascular Disease

## 2022-06-28 DIAGNOSIS — I480 Paroxysmal atrial fibrillation: Secondary | ICD-10-CM

## 2022-06-28 NOTE — Telephone Encounter (Signed)
Prescription refill request for Xarelto received.  Indication: Afib  Last office visit: 11/27/21 Tanner Ho)  Weight: 72.1kg Age: 82 Scr: 1.93 (04/05/22)   CrCl: 30.65m/min  Appropriate dose and refill sent to requested pharmacy.

## 2022-07-26 DIAGNOSIS — Z08 Encounter for follow-up examination after completed treatment for malignant neoplasm: Secondary | ICD-10-CM | POA: Diagnosis not present

## 2022-07-26 DIAGNOSIS — D225 Melanocytic nevi of trunk: Secondary | ICD-10-CM | POA: Diagnosis not present

## 2022-07-26 DIAGNOSIS — L821 Other seborrheic keratosis: Secondary | ICD-10-CM | POA: Diagnosis not present

## 2022-07-26 DIAGNOSIS — R229 Localized swelling, mass and lump, unspecified: Secondary | ICD-10-CM | POA: Diagnosis not present

## 2022-07-26 DIAGNOSIS — D2272 Melanocytic nevi of left lower limb, including hip: Secondary | ICD-10-CM | POA: Diagnosis not present

## 2022-07-26 DIAGNOSIS — C44329 Squamous cell carcinoma of skin of other parts of face: Secondary | ICD-10-CM | POA: Diagnosis not present

## 2022-07-26 DIAGNOSIS — L570 Actinic keratosis: Secondary | ICD-10-CM | POA: Diagnosis not present

## 2022-07-26 DIAGNOSIS — L304 Erythema intertrigo: Secondary | ICD-10-CM | POA: Diagnosis not present

## 2022-07-26 DIAGNOSIS — Z85828 Personal history of other malignant neoplasm of skin: Secondary | ICD-10-CM | POA: Diagnosis not present

## 2022-07-31 DIAGNOSIS — N401 Enlarged prostate with lower urinary tract symptoms: Secondary | ICD-10-CM | POA: Diagnosis not present

## 2022-07-31 DIAGNOSIS — R3914 Feeling of incomplete bladder emptying: Secondary | ICD-10-CM | POA: Diagnosis not present

## 2022-07-31 DIAGNOSIS — N281 Cyst of kidney, acquired: Secondary | ICD-10-CM | POA: Diagnosis not present

## 2022-08-16 DIAGNOSIS — H01001 Unspecified blepharitis right upper eyelid: Secondary | ICD-10-CM | POA: Diagnosis not present

## 2022-08-16 DIAGNOSIS — H353134 Nonexudative age-related macular degeneration, bilateral, advanced atrophic with subfoveal involvement: Secondary | ICD-10-CM | POA: Diagnosis not present

## 2022-08-16 DIAGNOSIS — H524 Presbyopia: Secondary | ICD-10-CM | POA: Diagnosis not present

## 2022-08-16 DIAGNOSIS — H04123 Dry eye syndrome of bilateral lacrimal glands: Secondary | ICD-10-CM | POA: Diagnosis not present

## 2022-08-16 DIAGNOSIS — H31003 Unspecified chorioretinal scars, bilateral: Secondary | ICD-10-CM | POA: Diagnosis not present

## 2022-08-30 ENCOUNTER — Telehealth: Payer: Self-pay | Admitting: Cardiovascular Disease

## 2022-08-30 NOTE — Telephone Encounter (Signed)
Pts wife, Arville Go, is calling to follow up on the phone note from this morning. She is hopeful to hear something today.

## 2022-08-30 NOTE — Telephone Encounter (Signed)
Even with the best antiarrhythmic we have available, which is amiodarone, we expect breakthrough episodes of atrial fibrillation to occur. As the rate is well-controlled and he is not symptomatic, no change in the dose of amiodarone is necessary at this time.  Please check in with [place] again on Monday and let us know if he is still in abnormal rhythm.  Unless the arrhythmia is persistent for a long time, no change in strategy.

## 2022-08-30 NOTE — Telephone Encounter (Signed)
Spoke with patient's wife. When he woke up this morning, he felt like his heart rhythm was irregular. He has known PAF. Patient reports he is feeling better now. Wife reports HR with BP cuff was 107 but she manually counted 72 recently, but irregular. She reports pulse is generally in high 70s-80s. He takes amiodarone and xarelto. Has not taken PRN metoprolol tartrate as she was worried about his BP. Explained that PRN meto tart is only 12.5mg  BID PRN which is a low dose and not a very potent anti-hypertensive. Explained that with HR in the 70s-80s, PRN beta blocker is likely not needed. If he has sustained HR over 100, would be reasonable to take PRN dose. She asked about increasing amiodarone. Advised will send a message to DOD (Dr. Sallyanne Kuster) to review. Did explain that with rate controlled for the most part, compliance with medication, and no symptoms at this time, likely med changes will not be made.   Recent BPs 123/63 115/53

## 2022-08-30 NOTE — Telephone Encounter (Signed)
Pt c/o medication issue:  1. Name of Medication:  amiodarone (PACERONE) 200 MG tablet   metoprolol tartrate (LOPRESSOR) 25 MG tablet   2. How are you currently taking this medication (dosage and times per day)?    3. Are you having a reaction (difficulty breathing--STAT)?  no  4. What is your medication issue? Wife calling to see if patient medication can be adjusted. She believes he is in afib   Patient c/o Palpitations:  High priority if patient c/o lightheadedness, shortness of breath, or chest pain  How long have you had palpitations/irregular HR/ Afib? Are you having the symptoms now? Yes  Are you currently experiencing lightheadedness, SOB or CP? no  Do you have a history of afib (atrial fibrillation) or irregular heart rhythm? yes  Have you checked your BP or HR? (document readings if available): HR 67 (irregular)  Are you experiencing any other symptoms? no

## 2022-08-30 NOTE — Telephone Encounter (Signed)
Woke up this morning about 5 am and felt like his heart was irregular. Felt like it would have 4 or 5 beats then skip a beat. No other symptoms. Did not take his pulse at this time, he took HR at 7am and was  67. Took amiodarone 200 mg losartan 25 mg this am. At this time he states it still feels like it is irregular, but maybe a little better .BP at this time 123/63  Pulse 86.  She ask if he should take the metoprolol since it isn't racing, it just feels like it is not beating right. Or other recommendations

## 2022-08-30 NOTE — Telephone Encounter (Signed)
Spoke to wife and gave her message from Dr [employer].  She states understanding.  She will call back Monday to let us know how patient is doing.

## 2022-09-02 ENCOUNTER — Telehealth: Payer: Self-pay | Admitting: Cardiovascular Disease

## 2022-09-02 NOTE — Telephone Encounter (Signed)
Patient's wife called to let us know patient is doing fine.  She states his heart went back into rhythm during the night last night.  See phone note dated 08/30/22.

## 2022-09-05 DIAGNOSIS — L603 Nail dystrophy: Secondary | ICD-10-CM | POA: Diagnosis not present

## 2022-09-05 DIAGNOSIS — B351 Tinea unguium: Secondary | ICD-10-CM | POA: Diagnosis not present

## 2022-09-05 DIAGNOSIS — I70203 Unspecified atherosclerosis of native arteries of extremities, bilateral legs: Secondary | ICD-10-CM | POA: Diagnosis not present

## 2022-09-05 DIAGNOSIS — L84 Corns and callosities: Secondary | ICD-10-CM | POA: Diagnosis not present

## 2022-09-05 DIAGNOSIS — M79675 Pain in left toe(s): Secondary | ICD-10-CM | POA: Diagnosis not present

## 2022-10-13 ENCOUNTER — Other Ambulatory Visit: Payer: Self-pay | Admitting: Cardiovascular Disease

## 2022-11-08 ENCOUNTER — Other Ambulatory Visit: Payer: Self-pay | Admitting: Cardiovascular Disease

## 2022-11-10 ENCOUNTER — Other Ambulatory Visit: Payer: Self-pay | Admitting: Cardiovascular Disease

## 2022-11-10 DIAGNOSIS — I10 Essential (primary) hypertension: Secondary | ICD-10-CM

## 2022-11-27 DIAGNOSIS — Z9181 History of falling: Secondary | ICD-10-CM | POA: Diagnosis not present

## 2022-11-27 DIAGNOSIS — R748 Abnormal levels of other serum enzymes: Secondary | ICD-10-CM | POA: Diagnosis not present

## 2022-11-27 DIAGNOSIS — D531 Other megaloblastic anemias, not elsewhere classified: Secondary | ICD-10-CM | POA: Diagnosis not present

## 2022-11-27 DIAGNOSIS — I10 Essential (primary) hypertension: Secondary | ICD-10-CM | POA: Diagnosis not present

## 2022-11-27 DIAGNOSIS — M7989 Other specified soft tissue disorders: Secondary | ICD-10-CM | POA: Diagnosis not present

## 2022-12-04 ENCOUNTER — Telehealth: Payer: Self-pay | Admitting: Cardiovascular Disease

## 2022-12-04 ENCOUNTER — Other Ambulatory Visit: Payer: Self-pay

## 2022-12-04 DIAGNOSIS — D6869 Other thrombophilia: Secondary | ICD-10-CM | POA: Diagnosis not present

## 2022-12-04 DIAGNOSIS — K5900 Constipation, unspecified: Secondary | ICD-10-CM | POA: Diagnosis not present

## 2022-12-04 DIAGNOSIS — H3530 Unspecified macular degeneration: Secondary | ICD-10-CM | POA: Diagnosis not present

## 2022-12-04 DIAGNOSIS — I480 Paroxysmal atrial fibrillation: Secondary | ICD-10-CM | POA: Diagnosis not present

## 2022-12-04 DIAGNOSIS — D649 Anemia, unspecified: Secondary | ICD-10-CM | POA: Diagnosis not present

## 2022-12-04 DIAGNOSIS — M8580 Other specified disorders of bone density and structure, unspecified site: Secondary | ICD-10-CM | POA: Diagnosis not present

## 2022-12-04 DIAGNOSIS — E039 Hypothyroidism, unspecified: Secondary | ICD-10-CM | POA: Diagnosis not present

## 2022-12-04 DIAGNOSIS — D692 Other nonthrombocytopenic purpura: Secondary | ICD-10-CM | POA: Diagnosis not present

## 2022-12-04 DIAGNOSIS — Z0000 Encounter for general adult medical examination without abnormal findings: Secondary | ICD-10-CM | POA: Diagnosis not present

## 2022-12-04 DIAGNOSIS — N1832 Chronic kidney disease, stage 3b: Secondary | ICD-10-CM | POA: Diagnosis not present

## 2022-12-04 DIAGNOSIS — I10 Essential (primary) hypertension: Secondary | ICD-10-CM | POA: Diagnosis not present

## 2022-12-04 DIAGNOSIS — N401 Enlarged prostate with lower urinary tract symptoms: Secondary | ICD-10-CM | POA: Diagnosis not present

## 2022-12-04 MED ORDER — AMIODARONE HCL 200 MG PO TABS: 200.0000 mg | ORAL_TABLET | Freq: Every day | ORAL | 0 refills | Status: DC

## 2022-12-04 NOTE — Telephone Encounter (Signed)
*  STAT* If patient is at the pharmacy, call can be transferred to refill team.   1. Which medications need to be refilled? (please list name of each medication and dose if known) amiodarone (PACERONE) 200 MG tablet   2. Which pharmacy/location (including street and city if local pharmacy) is medication to be sent to?  [id number] - [place], [place] - [address]    3. Do they need a 30 day or 90 day supply? 90

## 2022-12-04 NOTE — Telephone Encounter (Signed)
Called patient and patients wife stated that the pharmacy called and said the medication was ready for pick up. I explained to the wife that he was not able to get a 90 day supply due to him not being seen in a year. She will call to schedule appointment.

## 2022-12-05 ENCOUNTER — Other Ambulatory Visit: Payer: Self-pay | Admitting: Cardiovascular Disease

## 2022-12-17 DIAGNOSIS — H548 Legal blindness, as defined in USA: Secondary | ICD-10-CM | POA: Diagnosis not present

## 2022-12-17 DIAGNOSIS — Z9181 History of falling: Secondary | ICD-10-CM | POA: Diagnosis not present

## 2022-12-17 DIAGNOSIS — E785 Hyperlipidemia, unspecified: Secondary | ICD-10-CM | POA: Diagnosis not present

## 2022-12-17 DIAGNOSIS — N1832 Chronic kidney disease, stage 3b: Secondary | ICD-10-CM | POA: Diagnosis not present

## 2022-12-17 DIAGNOSIS — Z7901 Long term (current) use of anticoagulants: Secondary | ICD-10-CM | POA: Diagnosis not present

## 2022-12-17 DIAGNOSIS — I129 Hypertensive chronic kidney disease with stage 1 through stage 4 chronic kidney disease, or unspecified chronic kidney disease: Secondary | ICD-10-CM | POA: Diagnosis not present

## 2022-12-17 DIAGNOSIS — I480 Paroxysmal atrial fibrillation: Secondary | ICD-10-CM | POA: Diagnosis not present

## 2022-12-17 DIAGNOSIS — K5900 Constipation, unspecified: Secondary | ICD-10-CM | POA: Diagnosis not present

## 2022-12-17 DIAGNOSIS — H3530 Unspecified macular degeneration: Secondary | ICD-10-CM | POA: Diagnosis not present

## 2022-12-17 DIAGNOSIS — D631 Anemia in chronic kidney disease: Secondary | ICD-10-CM | POA: Diagnosis not present

## 2022-12-17 DIAGNOSIS — Z8551 Personal history of malignant neoplasm of bladder: Secondary | ICD-10-CM | POA: Diagnosis not present

## 2022-12-17 DIAGNOSIS — N401 Enlarged prostate with lower urinary tract symptoms: Secondary | ICD-10-CM | POA: Diagnosis not present

## 2022-12-17 DIAGNOSIS — D6859 Other primary thrombophilia: Secondary | ICD-10-CM | POA: Diagnosis not present

## 2022-12-17 DIAGNOSIS — M8580 Other specified disorders of bone density and structure, unspecified site: Secondary | ICD-10-CM | POA: Diagnosis not present

## 2022-12-17 DIAGNOSIS — K760 Fatty (change of) liver, not elsewhere classified: Secondary | ICD-10-CM | POA: Diagnosis not present

## 2022-12-17 DIAGNOSIS — M7989 Other specified soft tissue disorders: Secondary | ICD-10-CM | POA: Diagnosis not present

## 2022-12-17 DIAGNOSIS — I2510 Atherosclerotic heart disease of native coronary artery without angina pectoris: Secondary | ICD-10-CM | POA: Diagnosis not present

## 2022-12-17 DIAGNOSIS — Z604 Social exclusion and rejection: Secondary | ICD-10-CM | POA: Diagnosis not present

## 2022-12-17 DIAGNOSIS — N138 Other obstructive and reflux uropathy: Secondary | ICD-10-CM | POA: Diagnosis not present

## 2022-12-17 DIAGNOSIS — Z87891 Personal history of nicotine dependence: Secondary | ICD-10-CM | POA: Diagnosis not present

## 2022-12-17 DIAGNOSIS — E039 Hypothyroidism, unspecified: Secondary | ICD-10-CM | POA: Diagnosis not present

## 2022-12-17 DIAGNOSIS — D692 Other nonthrombocytopenic purpura: Secondary | ICD-10-CM | POA: Diagnosis not present

## 2022-12-17 DIAGNOSIS — J45909 Unspecified asthma, uncomplicated: Secondary | ICD-10-CM | POA: Diagnosis not present

## 2022-12-17 DIAGNOSIS — I059 Rheumatic mitral valve disease, unspecified: Secondary | ICD-10-CM | POA: Diagnosis not present

## 2022-12-17 DIAGNOSIS — I351 Nonrheumatic aortic (valve) insufficiency: Secondary | ICD-10-CM | POA: Diagnosis not present

## 2022-12-18 ENCOUNTER — Other Ambulatory Visit: Payer: Self-pay | Admitting: Cardiovascular Disease

## 2022-12-23 DIAGNOSIS — D631 Anemia in chronic kidney disease: Secondary | ICD-10-CM | POA: Diagnosis not present

## 2022-12-23 DIAGNOSIS — D692 Other nonthrombocytopenic purpura: Secondary | ICD-10-CM | POA: Diagnosis not present

## 2022-12-23 DIAGNOSIS — I129 Hypertensive chronic kidney disease with stage 1 through stage 4 chronic kidney disease, or unspecified chronic kidney disease: Secondary | ICD-10-CM | POA: Diagnosis not present

## 2022-12-23 DIAGNOSIS — N1832 Chronic kidney disease, stage 3b: Secondary | ICD-10-CM | POA: Diagnosis not present

## 2022-12-23 DIAGNOSIS — D6859 Other primary thrombophilia: Secondary | ICD-10-CM | POA: Diagnosis not present

## 2022-12-23 DIAGNOSIS — I480 Paroxysmal atrial fibrillation: Secondary | ICD-10-CM | POA: Diagnosis not present

## 2022-12-27 ENCOUNTER — Other Ambulatory Visit: Payer: Self-pay | Admitting: Cardiovascular Disease

## 2022-12-27 DIAGNOSIS — I480 Paroxysmal atrial fibrillation: Secondary | ICD-10-CM

## 2022-12-28 DIAGNOSIS — D631 Anemia in chronic kidney disease: Secondary | ICD-10-CM | POA: Diagnosis not present

## 2022-12-28 DIAGNOSIS — N1832 Chronic kidney disease, stage 3b: Secondary | ICD-10-CM | POA: Diagnosis not present

## 2022-12-28 DIAGNOSIS — I129 Hypertensive chronic kidney disease with stage 1 through stage 4 chronic kidney disease, or unspecified chronic kidney disease: Secondary | ICD-10-CM | POA: Diagnosis not present

## 2022-12-28 DIAGNOSIS — I480 Paroxysmal atrial fibrillation: Secondary | ICD-10-CM | POA: Diagnosis not present

## 2022-12-28 DIAGNOSIS — D692 Other nonthrombocytopenic purpura: Secondary | ICD-10-CM | POA: Diagnosis not present

## 2022-12-28 DIAGNOSIS — D6859 Other primary thrombophilia: Secondary | ICD-10-CM | POA: Diagnosis not present

## 2022-12-30 NOTE — Telephone Encounter (Signed)
Prescription refill request for Xarelto received.  Indication:afib Last office visit:upcoming Weight:72.1  kg Age:82 Scr:1.93 CrCl:30.09  ml/min  Prescription refilled

## 2023-01-01 DIAGNOSIS — I480 Paroxysmal atrial fibrillation: Secondary | ICD-10-CM | POA: Diagnosis not present

## 2023-01-01 DIAGNOSIS — N1832 Chronic kidney disease, stage 3b: Secondary | ICD-10-CM | POA: Diagnosis not present

## 2023-01-01 DIAGNOSIS — D6859 Other primary thrombophilia: Secondary | ICD-10-CM | POA: Diagnosis not present

## 2023-01-01 DIAGNOSIS — D631 Anemia in chronic kidney disease: Secondary | ICD-10-CM | POA: Diagnosis not present

## 2023-01-01 DIAGNOSIS — D692 Other nonthrombocytopenic purpura: Secondary | ICD-10-CM | POA: Diagnosis not present

## 2023-01-01 DIAGNOSIS — I129 Hypertensive chronic kidney disease with stage 1 through stage 4 chronic kidney disease, or unspecified chronic kidney disease: Secondary | ICD-10-CM | POA: Diagnosis not present

## 2023-01-02 DIAGNOSIS — B351 Tinea unguium: Secondary | ICD-10-CM | POA: Diagnosis not present

## 2023-01-02 DIAGNOSIS — I70203 Unspecified atherosclerosis of native arteries of extremities, bilateral legs: Secondary | ICD-10-CM | POA: Diagnosis not present

## 2023-01-02 DIAGNOSIS — L84 Corns and callosities: Secondary | ICD-10-CM | POA: Diagnosis not present

## 2023-01-02 DIAGNOSIS — L603 Nail dystrophy: Secondary | ICD-10-CM | POA: Diagnosis not present

## 2023-01-02 DIAGNOSIS — M79675 Pain in left toe(s): Secondary | ICD-10-CM | POA: Diagnosis not present

## 2023-01-03 DIAGNOSIS — N1832 Chronic kidney disease, stage 3b: Secondary | ICD-10-CM | POA: Diagnosis not present

## 2023-01-03 DIAGNOSIS — I129 Hypertensive chronic kidney disease with stage 1 through stage 4 chronic kidney disease, or unspecified chronic kidney disease: Secondary | ICD-10-CM | POA: Diagnosis not present

## 2023-01-03 DIAGNOSIS — D6859 Other primary thrombophilia: Secondary | ICD-10-CM | POA: Diagnosis not present

## 2023-01-03 DIAGNOSIS — I480 Paroxysmal atrial fibrillation: Secondary | ICD-10-CM | POA: Diagnosis not present

## 2023-01-03 DIAGNOSIS — D692 Other nonthrombocytopenic purpura: Secondary | ICD-10-CM | POA: Diagnosis not present

## 2023-01-03 DIAGNOSIS — D631 Anemia in chronic kidney disease: Secondary | ICD-10-CM | POA: Diagnosis not present

## 2023-01-06 DIAGNOSIS — N1832 Chronic kidney disease, stage 3b: Secondary | ICD-10-CM | POA: Diagnosis not present

## 2023-01-06 DIAGNOSIS — D631 Anemia in chronic kidney disease: Secondary | ICD-10-CM | POA: Diagnosis not present

## 2023-01-06 DIAGNOSIS — D6859 Other primary thrombophilia: Secondary | ICD-10-CM | POA: Diagnosis not present

## 2023-01-06 DIAGNOSIS — I480 Paroxysmal atrial fibrillation: Secondary | ICD-10-CM | POA: Diagnosis not present

## 2023-01-06 DIAGNOSIS — I129 Hypertensive chronic kidney disease with stage 1 through stage 4 chronic kidney disease, or unspecified chronic kidney disease: Secondary | ICD-10-CM | POA: Diagnosis not present

## 2023-01-06 DIAGNOSIS — D692 Other nonthrombocytopenic purpura: Secondary | ICD-10-CM | POA: Diagnosis not present

## 2023-01-08 DIAGNOSIS — D631 Anemia in chronic kidney disease: Secondary | ICD-10-CM | POA: Diagnosis not present

## 2023-01-08 DIAGNOSIS — D6859 Other primary thrombophilia: Secondary | ICD-10-CM | POA: Diagnosis not present

## 2023-01-08 DIAGNOSIS — I480 Paroxysmal atrial fibrillation: Secondary | ICD-10-CM | POA: Diagnosis not present

## 2023-01-08 DIAGNOSIS — D692 Other nonthrombocytopenic purpura: Secondary | ICD-10-CM | POA: Diagnosis not present

## 2023-01-08 DIAGNOSIS — I129 Hypertensive chronic kidney disease with stage 1 through stage 4 chronic kidney disease, or unspecified chronic kidney disease: Secondary | ICD-10-CM | POA: Diagnosis not present

## 2023-01-08 DIAGNOSIS — N1832 Chronic kidney disease, stage 3b: Secondary | ICD-10-CM | POA: Diagnosis not present

## 2023-01-12 NOTE — Progress Notes (Unsigned)
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  He is on amiodarone for rate control at 200 mg daily.  He is also on a prescription of metoprolol to tartrate 12.5 mg twice a day as needed breakthrough atrial fibrillation.  Last seen by Dr. Tresa Endo on 11/27/2021.  Past Medical History    Past Medical History:  Diagnosis Date   Adrenal mass (HCC)    Arthritis    Asthma    BPH (benign prostatic hyperplasia)    Cancer (HCC)    Bladder Cancer (early 90's)   CKD (chronic kidney disease), stage III (HCC)    GERD (gastroesophageal reflux disease)    H/O cardiovascular stress test 03/2012   EKG with 1-2 mm ST segment depression but normal perfusion images. Followup metastases test reduced exercise effort and functional status and determinate for myocardial dysfunction   History of cardiac monitoring    a. 2015: cardiac monitor revealed episodes of PACs, bradycardia, with heart rates down to 48, 4 beat PSVT, 6 beat run of NSVT at a rate of 131. There was no recurrent afib.    Hyperlipidemia LDL goal < 100    Hypertension    Hypothyroidism    Macular degeneration    MVP (mitral valve prolapse)    on cardiac cath - not mentioned on 2014 echo.   NSVT (nonsustained ventricular tachycardia) (HCC)    PAF (paroxysmal atrial fibrillation) (HCC)    last episode 03/2012   Pericarditis    Posterior vitreous detachment of right eye 10/26/2019   Premature atrial contractions    Sinus bradycardia    Subconjunctival hemorrhage of left eye 10/26/2019   The condition of the involved eye is that of a subconjunctival hemorrhage.  These are often spontaneous  but are also often at or near the site of low location of an injection should to be placed into the eye.  In the absence of direct blunt or severe trauma these will typically resolve on their own spontaneously and have no impact on the vision.    These are very similar to having a bruise in your   Past Surgical History:  Procedure Laterality Date   ABDOMINAL VASCULAR ULTRASOUND EVALUATION  12/20/2008   mild non-obstructive plaque, abdominal aorta, with mild calcification at the bifurcation-no obstruction, no evidence for aneurysm of the abdominal aorta   APPENDECTOMY     CARDIAC CATHETERIZATION  01/04/1999   hyperdynamic LV function, probable angiographic mitral valve prolapse, systolic muscle bridging of the mid LAD without obstructive disease, systolic narrowing up to 70%   Cardiopulmonary MET-test  07/20/2012   reduced exercise effort and functional status, peak max O2 comsumption was only 58% of predicted, cardiovascular status was interpreted as indeterminate for myocardial dysfunction due to the suboptimal peak cardiovascular stress load.   EYE SURGERY Right    Detached Retina   HERNIA REPAIR     Incisional hernia repair (early 60s)   INGUINAL HERNIA REPAIR Right 04/17/2021   Procedure: RIGHT INGUINAL HERNIA REPAIR WITH MESH;  Surgeon: Manus Rudd, MD;  Location: [facility] OR;  Service: General;  Laterality: Right;   TONSILLECTOMY     UMBILICAL HERNIA REPAIR N/A 04/17/2021   Procedure: UMBILICAL HERNIA REPAIR;  Surgeon: Manus Rudd, MD;  Location: [facility] OR;  Service: General;  Laterality: N/A;  LMA & TAP BLOCK    Allergies  Allergies  Allergen Reactions   Ciprofloxacin Other (See Comments)    Cannot take due to currently taking Amiodarone   Hytrin [Terazosin] Other (See Comments)    Syncopal event    Antihistamines, Chlorpheniramine-Type Other (See Comments)   Doxycycline Other (See Comments)   Pseudoephedrine Other (See Comments)    History of Present Illness    [name]  returns to the office today for annual follow-up of PAF on Xarelto. , hyperlipidemia, hypertension.  He is here for medication refills and amiodarone management.  He denies any complaints of rapid heart rhythm, dyspnea on exertion, or fatigue.  He does feel occasional palpation which are brief and short-lived.  He has been compliant with Xarelto.  He is undergoing physical therapy for strengthening of his lower extremities as he uses a walker for ambulation and sometimes a wheelchair for lengthy times of walking.  He is in a wheelchair today as he was unable to ambulate from the waiting area to the clinic area.  Home Medications    Current Outpatient Medications  Medication Sig Dispense Refill   acetaminophen (TYLENOL) 325 MG tablet Take 650 mg by mouth every 6 (six) hours as needed for moderate pain.     acetaminophen (TYLENOL) 650 MG CR tablet Take 650 mg by mouth at bedtime as needed for pain.     alfuzosin (UROXATRAL) 10 MG 24 hr tablet Take 10 mg by mouth daily.     ascorbic acid (VITAMIN C) 1000 MG tablet Take 1,000 mg by mouth daily.     Cholecalciferol (VITAMIN D) 50 MCG (2000 UT) tablet Take 2,000 Units by mouth daily.     docusate sodium (COLACE) 100 MG capsule Take 100 mg by mouth 2 (two) times daily.     finasteride (PROSCAR) 5 MG tablet Take 5 mg by mouth daily.     hydrocortisone 2.5 % cream Apply 1 application topically as needed (itching).     ketoconazole (NIZORAL) 2 % cream Apply 1 application topically daily as needed for irritation.     levothyroxine (SYNTHROID) 75 MCG tablet Take 75 mcg by mouth daily before breakfast.  2   linaclotide (LINZESS) 145 MCG CAPS capsule Take 1 capsule (145 mcg total) by mouth as needed. 30 capsule 3   loratadine (CLARITIN) 10 MG tablet Take 10 mg by mouth daily.     melatonin 3 MG TABS tablet Take 3 mg by mouth at bedtime.     metoprolol tartrate (LOPRESSOR) 25 MG tablet Take 0.5 tablets (12.5 mg total) by mouth 2 (two) times daily as needed  (afib). 90 tablet 3   Multiple Vitamins-Minerals (PRESERVISION AREDS) CAPS Take 1 capsule by mouth 2 (two) times daily.     Polyethyl Glycol-Propyl Glycol 0.4-0.3 % SOLN Place 2 drops into both eyes 2 (two) times daily as needed (for dry eyes).     polyethylene glycol powder (MIRALAX) 17 GM/SCOOP powder See admin instructions.     Psyllium (METAMUCIL FIBER SINGLES PO)      Rivaroxaban (XARELTO) 15 MG TABS tablet Take 1 tablet (15 mg total) by mouth 2 (two) times daily with a meal. 14 tablet 0   simethicone (MYLICON) 125 MG chewable tablet Chew 125 mg by mouth every 6 (six) hours as needed for flatulence.     sodium chloride (OCEAN) 0.65 % SOLN nasal spray Place 1 spray into both nostrils  as needed for congestion.     vitamin B-12 (CYANOCOBALAMIN) 1000 MCG tablet Take 1,000 mcg by mouth daily.     amiodarone (PACERONE) 200 MG tablet Take 1 tablet (200 mg total) by mouth daily. 90 tablet 3   amoxicillin (AMOXIL) 500 MG capsule Take 2 capsules (1,000 mg total) by mouth 2 (two) times daily. (Patient not taking: Reported on 01/14/2023) 28 capsule 0   benzonatate (TESSALON) 100 MG capsule Take 1 capsule (100 mg total) by mouth every 8 (eight) hours. (Patient not taking: Reported on 01/14/2023) 21 capsule 0   chlorthalidone (HYGROTON) 25 MG tablet Take 0.5 tablets (12.5 mg total) by mouth daily. 135 tablet 4   losartan (COZAAR) 25 MG tablet Take 1 tablet (25 mg total) by mouth daily. 90 tablet 3   loteprednol (LOTEMAX) 0.5 % ophthalmic suspension Place 1 drop into the right eye daily. 5 mL 1   potassium chloride SA (KLOR-CON M) 20 MEQ tablet Take 1 tablet (20 mEq total) by mouth 2 (two) times daily. (Patient not taking: Reported on 01/14/2023) 20 tablet 0   Rivaroxaban (XARELTO) 15 MG TABS tablet Take 1 tablet (15 mg total) by mouth daily with supper. 30 tablet 5   rosuvastatin (CRESTOR) 5 MG tablet TAKE 1 TABLET BY MOUTH DAILY AT 6 PM. Please call office to schedule an appt for further refills. Thank you 90  tablet 3   traMADol (ULTRAM) 50 MG tablet Take 1 tablet (50 mg total) by mouth every 6 (six) hours as needed for moderate pain. (Patient not taking: Reported on 01/14/2023) 20 tablet 0   No current facility-administered medications for this visit.     Family History    Family History  Problem Relation Age of Onset   Stroke Mother    Stroke Paternal Grandfather    He indicated that his mother is deceased. He indicated that his father is deceased. He indicated that his sister is alive. He indicated that his maternal grandmother is deceased. He indicated that his maternal grandfather is deceased. He indicated that his paternal grandmother is deceased. He indicated that his paternal grandfather is deceased.  Social History    Social History   Socioeconomic History   Marital status: Married    Spouse name: Not on file   Number of children: Not on file   Years of education: Not on file   Highest education level: Not on file  Occupational History   Not on file  Tobacco Use   Smoking status: Former   Smokeless tobacco: Never  Vaping Use   Vaping status: Never Used  Substance and Sexual Activity   Alcohol use: No   Drug use: No   Sexual activity: Not on file  Other Topics Concern   Not on file  Social History Narrative   Not on file   Social Determinants of Health   Financial Resource Strain: Not on file  Food Insecurity: Not on file  Transportation Needs: Not on file  Physical Activity: Not on file  Stress: Not on file  Social Connections: Not on file  Intimate Partner Violence: Not on file     Review of Systems    General:  No chills, fever, night sweats or weight changes.  Cardiovascular:  No chest pain, dyspnea on exertion, edema, orthopnea, palpitations, paroxysmal nocturnal dyspnea. Dermatological: No rash, lesions/masses Respiratory: No cough, dyspnea Urologic: No hematuria, dysuria Abdominal:   No nausea, vomiting, diarrhea, bright red blood per rectum,  melena, or hematemesis Neurologic:  No visual  changes, wkns, changes in mental status. All other systems reviewed and are otherwise negative except as noted above.  EKG Interpretation Date/Time:  Tuesday January 14 2023 10:18:12 EDT Ventricular Rate:  58 PR Interval:  188 QRS Duration:  102 QT Interval:  466 QTC Calculation: 457 R Axis:   -32  Text Interpretation: Sinus bradycardia Left axis deviation Nonspecific T wave abnormality When compared with ECG of 16-Aug-2016 22:28, PREVIOUS ECG IS PRESENT Confirmed by Joni Reining [phone]) on 01/14/2023 10:34:02 AM    Physical Exam    VS:  BP 138/62 (BP Location: Right Arm, Patient Position: Sitting, Cuff Size: Normal)   Pulse (!) 58   Ht 5\' 6"  (1.676 m)   Wt 159 lb 9.6 oz (72.4 kg)   SpO2 100%   BMI 25.76 kg/m  , BMI Body mass index is 25.76 kg/m.     GEN: Well nourished, well developed, in no acute distress.  He is in a wheelchair. HEENT: normal. Neck: Supple, no JVD, carotid bruits, or masses. Cardiac: RRR, 2/6 soft systolic murmur, rubs, or gallops. No clubbing, cyanosis, edema.  Radials/DP/PT 2+ and equal bilaterally.  Respiratory:  Respirations regular and unlabored, clear to auscultation bilaterally. GI: Soft, nontender, nondistended, BS + x 4. MS: no deformity or atrophy.  Generalized weakness of lower extremities when I asked him to extend them to check pulses. Skin: warm and dry, no rash. Neuro:  Strength and sensation are intact. Psych: Normal affect.  EKG Interpretation Date/Time:  Tuesday January 14 2023 10:18:12 EDT Ventricular Rate:  58 PR Interval:  188 QRS Duration:  102 QT Interval:  466 QTC Calculation: 457 R Axis:   -32  Text Interpretation: Sinus bradycardia Left axis deviation Nonspecific T wave abnormality When compared with ECG of 16-Aug-2016 22:28, PREVIOUS ECG IS PRESENT Confirmed by Joni Reining [phone]) on 01/14/2023 10:34:02 AM   Lab Results  Component Value Date   WBC 4.8 04/05/2022   HGB  12.3 (L) 04/05/2022   HCT 36.3 (L) 04/05/2022   MCV 105.5 (H) 04/05/2022   PLT 126 (L) 04/05/2022   Lab Results  Component Value Date   CREATININE 1.93 (H) 04/05/2022   BUN 34 (H) 04/05/2022   NA 135 04/05/2022   K 3.3 (L) 04/05/2022   CL 100 04/05/2022   CO2 24 04/05/2022   Lab Results  Component Value Date   ALT 10 04/05/2022   AST 18 04/05/2022   ALKPHOS 86 04/05/2022   BILITOT 0.4 04/05/2022   Lab Results  Component Value Date   CHOL 120 (L) 12/28/2014   HDL 49 12/28/2014   LDLCALC 54 12/28/2014   TRIG 85 12/28/2014   CHOLHDL 2.4 12/28/2014    No results found for: "HGBA1C"   Review of Prior Studies EKG Interpretation Date/Time:  Tuesday January 14 2023 10:18:12 EDT Ventricular Rate:  58 PR Interval:  188 QRS Duration:  102 QT Interval:  466 QTC Calculation: 457 R Axis:   -32  Text Interpretation: Sinus bradycardia Left axis deviation Nonspecific T wave abnormality When compared with ECG of 16-Aug-2016 22:28, PREVIOUS ECG IS PRESENT Confirmed by Joni Reining [phone]) on 01/14/2023 10:34:02 AM    Echocardiogram 09/05/2010 Study Conclusions   - Left ventricle: The cavity size was normal. Wall thickness was    increased in a pattern of moderate LVH. Systolic function was    normal. The estimated ejection fraction was in the range of 55%    to 60%. Wall motion was normal; there were no regional wall  motion abnormalities. The study is not technically sufficient to    allow evaluation of LV diastolic function.  - Aortic valve: Trileaflet; mildly thickened leaflets. There was    mild regurgitation.  - Aorta: Aortic root dimension: 39 mm (ED).  - Aortic root: The aortic root is mildly dilated.  - Mitral valve: Mild late systolic bileaflet prolapse. There is    mild regurgitation. Mildly thickened leaflets .  - Left atrium: The atrium was normal in size.  - Atrial septum: No defect or patent foramen ovale was identified.  - Tricuspid valve: There was trivial  regurgitation.  - Pulmonary arteries: PA peak pressure: 39 mm Hg (S).  - Inferior vena cava: The vessel was normal in size. The    respirophasic diameter changes were in the normal range (= 50%),    consistent with normal central venous pressure.    Assessment & Plan   1.  PAF: Patient is currently on amiodarone, and Xarelto.  Labs have been evaluated by PCP to include TSH.  Patient's wife reports that all were within normal limits.  He denies any sustained rapid heart rhythm or irregular rhythm.  He is having brief episodes which are not limiting.  He has not had to take any extra doses of metoprolol.  Continue current medication regimen for now refills are provided on Xarelto along with some samples due to donut hole insufficiency of funds.  2.  Mitral valve prolapse: Completely asymptomatic.  The patient is not very active, uses it for ambulation and is undergoing physical therapy so he is not having complaints of shortness of breath or chest discomfort at this time.  3.  Hyperlipidemia: Followed by primary care provider.  Labs have been drawn by primary care provider.  Patient remains on rosuvastatin 5 mg daily.  4.  Hypertension: Blood pressure is very well-controlled today.  No changes in meds occasion regimen.  Refills are provided on antihypertensives.       Signed, Bettey Mare. Liborio Nixon, ANP, AACC   01/14/2023 1:07 PM      Office [phone] Fax [phone]  Notice: This dictation was prepared with Dragon dictation along with smaller phrase technology. Any transcriptional errors that result from this process are unintentional and may not be corrected upon review.

## 2023-01-14 ENCOUNTER — Ambulatory Visit: Payer: Medicare Other | Admitting: Adult Health

## 2023-01-14 ENCOUNTER — Encounter: Payer: Self-pay | Admitting: Adult Health

## 2023-01-14 VITALS — BP 138/62 | HR 58 | Ht 66.0 in | Wt 159.6 lb

## 2023-01-14 DIAGNOSIS — I10 Essential (primary) hypertension: Secondary | ICD-10-CM

## 2023-01-14 DIAGNOSIS — I480 Paroxysmal atrial fibrillation: Secondary | ICD-10-CM

## 2023-01-14 DIAGNOSIS — E7800 Pure hypercholesterolemia, unspecified: Secondary | ICD-10-CM | POA: Diagnosis not present

## 2023-01-14 DIAGNOSIS — R5381 Other malaise: Secondary | ICD-10-CM | POA: Diagnosis not present

## 2023-01-14 MED ORDER — CHLORTHALIDONE 25 MG PO TABS: 12.5000 mg | ORAL_TABLET | Freq: Every day | ORAL | 4 refills | Status: AC

## 2023-01-14 MED ORDER — RIVAROXABAN 15 MG PO TABS: 15.0000 mg | ORAL_TABLET | Freq: Two times a day (BID) | ORAL | 0 refills | Status: DC

## 2023-01-14 MED ORDER — LOSARTAN POTASSIUM 25 MG PO TABS: 25.0000 mg | ORAL_TABLET | Freq: Every day | ORAL | 3 refills | Status: AC

## 2023-01-14 MED ORDER — RIVAROXABAN 15 MG PO TABS: 15.0000 mg | ORAL_TABLET | Freq: Every day | ORAL | 5 refills | Status: AC

## 2023-01-14 MED ORDER — ROSUVASTATIN CALCIUM 5 MG PO TABS: ORAL_TABLET | ORAL | 3 refills | Status: AC

## 2023-01-14 MED ORDER — AMIODARONE HCL 200 MG PO TABS: 200.0000 mg | ORAL_TABLET | Freq: Every day | ORAL | 3 refills | Status: AC

## 2023-01-14 NOTE — Patient Instructions (Signed)
Medication Instructions:  No Chagnes *If you need a refill on your cardiac medications before your next appointment, please call your pharmacy*   Lab Work: No Labs If you have labs (blood work) drawn today and your tests are completely normal, you will receive your results only by: MyChart Message (if you have MyChart) OR A paper copy in the mail If you have any lab test that is abnormal or we need to change your treatment, we will call you to review the results.   Testing/Procedures: No Testing   Follow-Up: At [facility], you and your health needs are our priority.  As part of our continuing mission to provide you with exceptional heart care, we have created designated Provider Care Teams.  These Care Teams include your primary Cardiologist (physician) and Advanced Practice Providers (APPs -  Physician Assistants and Nurse Practitioners) who all work together to provide you with the care you need, when you need it.  We recommend signing up for the patient portal called "MyChart".  Sign up information is provided on this After Visit Summary.  MyChart is used to connect with patients for Virtual Visits (Telemedicine).  Patients are able to view lab/test results, encounter notes, upcoming appointments, etc.  Non-urgent messages can be sent to your provider as well.   To learn more about what you can do with MyChart, go to [email or web address].    Your next appointment:   April 2025  Provider:   Nicki Guadalajara, MD

## 2023-01-15 DIAGNOSIS — I480 Paroxysmal atrial fibrillation: Secondary | ICD-10-CM | POA: Diagnosis not present

## 2023-01-15 DIAGNOSIS — N1832 Chronic kidney disease, stage 3b: Secondary | ICD-10-CM | POA: Diagnosis not present

## 2023-01-15 DIAGNOSIS — D692 Other nonthrombocytopenic purpura: Secondary | ICD-10-CM | POA: Diagnosis not present

## 2023-01-15 DIAGNOSIS — D631 Anemia in chronic kidney disease: Secondary | ICD-10-CM | POA: Diagnosis not present

## 2023-01-15 DIAGNOSIS — D6859 Other primary thrombophilia: Secondary | ICD-10-CM | POA: Diagnosis not present

## 2023-01-15 DIAGNOSIS — I129 Hypertensive chronic kidney disease with stage 1 through stage 4 chronic kidney disease, or unspecified chronic kidney disease: Secondary | ICD-10-CM | POA: Diagnosis not present

## 2023-01-16 DIAGNOSIS — D6859 Other primary thrombophilia: Secondary | ICD-10-CM | POA: Diagnosis not present

## 2023-01-16 DIAGNOSIS — H3530 Unspecified macular degeneration: Secondary | ICD-10-CM | POA: Diagnosis not present

## 2023-01-16 DIAGNOSIS — Z7901 Long term (current) use of anticoagulants: Secondary | ICD-10-CM | POA: Diagnosis not present

## 2023-01-16 DIAGNOSIS — N138 Other obstructive and reflux uropathy: Secondary | ICD-10-CM | POA: Diagnosis not present

## 2023-01-16 DIAGNOSIS — E785 Hyperlipidemia, unspecified: Secondary | ICD-10-CM | POA: Diagnosis not present

## 2023-01-16 DIAGNOSIS — D692 Other nonthrombocytopenic purpura: Secondary | ICD-10-CM | POA: Diagnosis not present

## 2023-01-16 DIAGNOSIS — Z9181 History of falling: Secondary | ICD-10-CM | POA: Diagnosis not present

## 2023-01-16 DIAGNOSIS — I480 Paroxysmal atrial fibrillation: Secondary | ICD-10-CM | POA: Diagnosis not present

## 2023-01-16 DIAGNOSIS — Z604 Social exclusion and rejection: Secondary | ICD-10-CM | POA: Diagnosis not present

## 2023-01-16 DIAGNOSIS — E039 Hypothyroidism, unspecified: Secondary | ICD-10-CM | POA: Diagnosis not present

## 2023-01-16 DIAGNOSIS — J45909 Unspecified asthma, uncomplicated: Secondary | ICD-10-CM | POA: Diagnosis not present

## 2023-01-16 DIAGNOSIS — I059 Rheumatic mitral valve disease, unspecified: Secondary | ICD-10-CM | POA: Diagnosis not present

## 2023-01-16 DIAGNOSIS — K5900 Constipation, unspecified: Secondary | ICD-10-CM | POA: Diagnosis not present

## 2023-01-16 DIAGNOSIS — D631 Anemia in chronic kidney disease: Secondary | ICD-10-CM | POA: Diagnosis not present

## 2023-01-16 DIAGNOSIS — K760 Fatty (change of) liver, not elsewhere classified: Secondary | ICD-10-CM | POA: Diagnosis not present

## 2023-01-16 DIAGNOSIS — N1832 Chronic kidney disease, stage 3b: Secondary | ICD-10-CM | POA: Diagnosis not present

## 2023-01-16 DIAGNOSIS — I351 Nonrheumatic aortic (valve) insufficiency: Secondary | ICD-10-CM | POA: Diagnosis not present

## 2023-01-16 DIAGNOSIS — I129 Hypertensive chronic kidney disease with stage 1 through stage 4 chronic kidney disease, or unspecified chronic kidney disease: Secondary | ICD-10-CM | POA: Diagnosis not present

## 2023-01-16 DIAGNOSIS — M7989 Other specified soft tissue disorders: Secondary | ICD-10-CM | POA: Diagnosis not present

## 2023-01-16 DIAGNOSIS — M8580 Other specified disorders of bone density and structure, unspecified site: Secondary | ICD-10-CM | POA: Diagnosis not present

## 2023-01-16 DIAGNOSIS — I2510 Atherosclerotic heart disease of native coronary artery without angina pectoris: Secondary | ICD-10-CM | POA: Diagnosis not present

## 2023-01-16 DIAGNOSIS — Z87891 Personal history of nicotine dependence: Secondary | ICD-10-CM | POA: Diagnosis not present

## 2023-01-16 DIAGNOSIS — Z8551 Personal history of malignant neoplasm of bladder: Secondary | ICD-10-CM | POA: Diagnosis not present

## 2023-01-16 DIAGNOSIS — H548 Legal blindness, as defined in USA: Secondary | ICD-10-CM | POA: Diagnosis not present

## 2023-01-16 DIAGNOSIS — N401 Enlarged prostate with lower urinary tract symptoms: Secondary | ICD-10-CM | POA: Diagnosis not present

## 2023-01-20 DIAGNOSIS — N1832 Chronic kidney disease, stage 3b: Secondary | ICD-10-CM | POA: Diagnosis not present

## 2023-01-20 DIAGNOSIS — I129 Hypertensive chronic kidney disease with stage 1 through stage 4 chronic kidney disease, or unspecified chronic kidney disease: Secondary | ICD-10-CM | POA: Diagnosis not present

## 2023-01-20 DIAGNOSIS — D692 Other nonthrombocytopenic purpura: Secondary | ICD-10-CM | POA: Diagnosis not present

## 2023-01-20 DIAGNOSIS — D6859 Other primary thrombophilia: Secondary | ICD-10-CM | POA: Diagnosis not present

## 2023-01-20 DIAGNOSIS — I480 Paroxysmal atrial fibrillation: Secondary | ICD-10-CM | POA: Diagnosis not present

## 2023-01-20 DIAGNOSIS — D631 Anemia in chronic kidney disease: Secondary | ICD-10-CM | POA: Diagnosis not present

## 2023-01-27 DIAGNOSIS — I129 Hypertensive chronic kidney disease with stage 1 through stage 4 chronic kidney disease, or unspecified chronic kidney disease: Secondary | ICD-10-CM | POA: Diagnosis not present

## 2023-01-27 DIAGNOSIS — N1832 Chronic kidney disease, stage 3b: Secondary | ICD-10-CM | POA: Diagnosis not present

## 2023-01-27 DIAGNOSIS — D6859 Other primary thrombophilia: Secondary | ICD-10-CM | POA: Diagnosis not present

## 2023-01-27 DIAGNOSIS — D631 Anemia in chronic kidney disease: Secondary | ICD-10-CM | POA: Diagnosis not present

## 2023-01-27 DIAGNOSIS — I480 Paroxysmal atrial fibrillation: Secondary | ICD-10-CM | POA: Diagnosis not present

## 2023-01-27 DIAGNOSIS — D692 Other nonthrombocytopenic purpura: Secondary | ICD-10-CM | POA: Diagnosis not present

## 2023-02-05 DIAGNOSIS — I480 Paroxysmal atrial fibrillation: Secondary | ICD-10-CM | POA: Diagnosis not present

## 2023-02-05 DIAGNOSIS — D631 Anemia in chronic kidney disease: Secondary | ICD-10-CM | POA: Diagnosis not present

## 2023-02-05 DIAGNOSIS — I129 Hypertensive chronic kidney disease with stage 1 through stage 4 chronic kidney disease, or unspecified chronic kidney disease: Secondary | ICD-10-CM | POA: Diagnosis not present

## 2023-02-05 DIAGNOSIS — D6859 Other primary thrombophilia: Secondary | ICD-10-CM | POA: Diagnosis not present

## 2023-02-05 DIAGNOSIS — N1832 Chronic kidney disease, stage 3b: Secondary | ICD-10-CM | POA: Diagnosis not present

## 2023-02-05 DIAGNOSIS — D692 Other nonthrombocytopenic purpura: Secondary | ICD-10-CM | POA: Diagnosis not present

## 2023-02-10 DIAGNOSIS — L0292 Furuncle, unspecified: Secondary | ICD-10-CM | POA: Diagnosis not present

## 2023-02-14 DIAGNOSIS — D692 Other nonthrombocytopenic purpura: Secondary | ICD-10-CM | POA: Diagnosis not present

## 2023-02-14 DIAGNOSIS — I480 Paroxysmal atrial fibrillation: Secondary | ICD-10-CM | POA: Diagnosis not present

## 2023-02-14 DIAGNOSIS — N1832 Chronic kidney disease, stage 3b: Secondary | ICD-10-CM | POA: Diagnosis not present

## 2023-02-14 DIAGNOSIS — D631 Anemia in chronic kidney disease: Secondary | ICD-10-CM | POA: Diagnosis not present

## 2023-02-14 DIAGNOSIS — D6859 Other primary thrombophilia: Secondary | ICD-10-CM | POA: Diagnosis not present

## 2023-02-14 DIAGNOSIS — I129 Hypertensive chronic kidney disease with stage 1 through stage 4 chronic kidney disease, or unspecified chronic kidney disease: Secondary | ICD-10-CM | POA: Diagnosis not present

## 2023-02-19 DIAGNOSIS — R229 Localized swelling, mass and lump, unspecified: Secondary | ICD-10-CM | POA: Diagnosis not present

## 2023-02-19 DIAGNOSIS — C44329 Squamous cell carcinoma of skin of other parts of face: Secondary | ICD-10-CM | POA: Diagnosis not present

## 2023-02-19 DIAGNOSIS — L820 Inflamed seborrheic keratosis: Secondary | ICD-10-CM | POA: Diagnosis not present

## 2023-02-19 DIAGNOSIS — Z08 Encounter for follow-up examination after completed treatment for malignant neoplasm: Secondary | ICD-10-CM | POA: Diagnosis not present

## 2023-02-19 DIAGNOSIS — L821 Other seborrheic keratosis: Secondary | ICD-10-CM | POA: Diagnosis not present

## 2023-02-19 DIAGNOSIS — Z85828 Personal history of other malignant neoplasm of skin: Secondary | ICD-10-CM | POA: Diagnosis not present

## 2023-02-19 DIAGNOSIS — D225 Melanocytic nevi of trunk: Secondary | ICD-10-CM | POA: Diagnosis not present

## 2023-02-19 DIAGNOSIS — D485 Neoplasm of uncertain behavior of skin: Secondary | ICD-10-CM | POA: Diagnosis not present

## 2023-02-21 ENCOUNTER — Other Ambulatory Visit: Payer: Self-pay

## 2023-02-21 ENCOUNTER — Emergency Department (HOSPITAL_COMMUNITY)
Admission: EM | Admit: 2023-02-21 | Discharge: 2023-02-21 | Disposition: A | Discharge status: Home / Self Care | Payer: Medicare Other | Attending: Emergency Medicine | Admitting: Emergency Medicine

## 2023-02-21 ENCOUNTER — Emergency Department (HOSPITAL_COMMUNITY): Payer: Medicare Other

## 2023-02-21 ENCOUNTER — Encounter (HOSPITAL_COMMUNITY): Payer: Self-pay | Admitting: Emergency Medicine

## 2023-02-21 DIAGNOSIS — R079 Chest pain, unspecified: Secondary | ICD-10-CM | POA: Diagnosis not present

## 2023-02-21 DIAGNOSIS — R0789 Other chest pain: Secondary | ICD-10-CM | POA: Diagnosis not present

## 2023-02-21 DIAGNOSIS — I10 Essential (primary) hypertension: Secondary | ICD-10-CM | POA: Diagnosis not present

## 2023-02-21 DIAGNOSIS — Z7901 Long term (current) use of anticoagulants: Secondary | ICD-10-CM | POA: Insufficient documentation

## 2023-02-21 DIAGNOSIS — I517 Cardiomegaly: Secondary | ICD-10-CM | POA: Diagnosis not present

## 2023-02-21 DIAGNOSIS — Z87891 Personal history of nicotine dependence: Secondary | ICD-10-CM | POA: Diagnosis not present

## 2023-02-21 DIAGNOSIS — R6889 Other general symptoms and signs: Secondary | ICD-10-CM | POA: Diagnosis not present

## 2023-02-21 DIAGNOSIS — Z79890 Hormone replacement therapy (postmenopausal): Secondary | ICD-10-CM | POA: Insufficient documentation

## 2023-02-21 DIAGNOSIS — E039 Hypothyroidism, unspecified: Secondary | ICD-10-CM | POA: Insufficient documentation

## 2023-02-21 DIAGNOSIS — Z79899 Other long term (current) drug therapy: Secondary | ICD-10-CM | POA: Insufficient documentation

## 2023-02-21 DIAGNOSIS — M546 Pain in thoracic spine: Secondary | ICD-10-CM | POA: Diagnosis not present

## 2023-02-21 LAB — BASIC METABOLIC PANEL
Anion gap: 12 (ref 5–15)
BUN: 33 mg/dL — ABNORMAL HIGH (ref 8–23)
CO2: 23 mmol/L (ref 22–32)
Calcium: 8.7 mg/dL — ABNORMAL LOW (ref 8.9–10.3)
Chloride: 104 mmol/L (ref 98–111)
Creatinine, Ser: 1.61 mg/dL — ABNORMAL HIGH (ref 0.61–1.24)
GFR, Estimated: 42 mL/min — ABNORMAL LOW (ref >60)
Glucose, Bld: 107 mg/dL — ABNORMAL HIGH (ref 70–99)
Potassium: 3.8 mmol/L (ref 3.5–5.1)
Sodium: 139 mmol/L (ref 135–145)

## 2023-02-21 LAB — CBC
HCT: 34.1 % — ABNORMAL LOW (ref 39.0–52.0)
Hemoglobin: 11.2 g/dL — ABNORMAL LOW (ref 13.0–17.0)
MCH: 34.8 pg — ABNORMAL HIGH (ref 26.0–34.0)
MCHC: 32.8 g/dL (ref 30.0–36.0)
MCV: 105.9 fL — ABNORMAL HIGH (ref 80.0–100.0)
Platelets: 182 10*3/uL (ref 150–400)
RBC: 3.22 MIL/uL — ABNORMAL LOW (ref 4.22–5.81)
RDW: 12.5 % (ref 11.5–15.5)
WBC: 5.9 10*3/uL (ref 4.0–10.5)
nRBC: 0 % (ref 0.0–0.2)

## 2023-02-21 LAB — TROPONIN I (HIGH SENSITIVITY)
Troponin I (High Sensitivity): 15 ng/L (ref <18)
Troponin I (High Sensitivity): 17 ng/L (ref <18)

## 2023-02-21 NOTE — ED Notes (Signed)
Pt transported to [facility]

## 2023-02-21 NOTE — Discharge Instructions (Signed)
You were seen in the emergency department for your chest pain.  Your workup showed no signs of heart attack or stress on your heart and no abnormalities within your lungs.  It is unclear what is causing your chest pain at this time but you can continue to take Tylenol as needed.  You should call your cardiologist and schedule follow-up appointment in the next few days to have your symptoms rechecked.  You should return to the emergency department if you have significantly worsening pain, severe shortness of breath, you pass out or if you have any other new or concerning symptoms.

## 2023-02-21 NOTE — ED Provider Notes (Signed)
[place] EMERGENCY DEPARTMENT AT [facility] Provider Note   CSN: [id number] Arrival date & time: 02/21/23  1004     History  Chief Complaint  Patient presents with   Chest Pain    Tanner Ho is a 82 y.o. male.  Patient is an 82 year old male with a past medical history of A-fib on Xarelto, hypertension, hypothyroidism presenting to the emergency department with chest pain.  Patient states around 7:00 this morning he woke up and while ambulating to the bathroom he started to develop chest pain across his chest.  He states it was nonradiating.  He is unable to describe the pain felt like but states that it slowly eased off since it started this morning.  He denies any associated nausea, vomiting or diaphoresis.  He states that he took an antacid at home without significant relief.  He was given nitro and aspirin and route by EMS.  The patient denies any recent lower extremity swelling, fevers or cough.  He denies prior history of CAD.  The history is provided by the patient, the EMS personnel and the spouse.  Chest Pain      Home Medications Prior to Admission medications   Medication Sig Start Date End Date Taking? Authorizing Provider  acetaminophen (TYLENOL) 325 MG tablet Take 650 mg by mouth every 6 (six) hours as needed for moderate pain.    [provider]  acetaminophen (TYLENOL) 650 MG CR tablet Take 650 mg by mouth at bedtime as needed for pain.    [provider]  alfuzosin (UROXATRAL) 10 MG 24 hr tablet Take 10 mg by mouth daily.    [provider]  amiodarone (PACERONE) 200 MG tablet Take 1 tablet (200 mg total) by mouth daily. 01/14/23   Jodelle Gross, NP  amoxicillin (AMOXIL) 500 MG capsule Take 2 capsules (1,000 mg total) by mouth 2 (two) times daily. Patient not taking: Reported on 01/14/2023 04/06/22   Dione Booze, MD  ascorbic acid (VITAMIN C) 1000 MG tablet Take 1,000 mg by mouth daily.    [provider]   benzonatate (TESSALON) 100 MG capsule Take 1 capsule (100 mg total) by mouth every 8 (eight) hours. Patient not taking: Reported on 01/14/2023 04/06/22   Dione Booze, MD  chlorthalidone (HYGROTON) 25 MG tablet Take 0.5 tablets (12.5 mg total) by mouth daily. 01/14/23   Jodelle Gross, NP  Cholecalciferol (VITAMIN D) 50 MCG (2000 UT) tablet Take 2,000 Units by mouth daily.    [provider]  docusate sodium (COLACE) 100 MG capsule Take 100 mg by mouth 2 (two) times daily.    [provider]  finasteride (PROSCAR) 5 MG tablet Take 5 mg by mouth daily.    [provider]  hydrocortisone 2.5 % cream Apply 1 application topically as needed (itching). 08/12/19   [provider]  ketoconazole (NIZORAL) 2 % cream Apply 1 application topically daily as needed for irritation.    [provider]  levothyroxine (SYNTHROID) 75 MCG tablet Take 75 mcg by mouth daily before breakfast. 04/22/14   [provider]  linaclotide (LINZESS) 145 MCG CAPS capsule Take 1 capsule (145 mcg total) by mouth as needed. 06/23/20   Zehr, Princella Pellegrini, PA-C  loratadine (CLARITIN) 10 MG tablet Take 10 mg by mouth daily.    [provider]  losartan (COZAAR) 25 MG tablet Take 1 tablet (25 mg total) by mouth daily. 01/14/23   Jodelle Gross, NP  loteprednol (LOTEMAX) 0.5 %  ophthalmic suspension Place 1 drop into the right eye daily. 10/05/20 10/05/21  Rankin, Alford Highland, MD  melatonin 3 MG TABS tablet Take 3 mg by mouth at bedtime.    [provider]  metoprolol tartrate (LOPRESSOR) 25 MG tablet Take 0.5 tablets (12.5 mg total) by mouth 2 (two) times daily as needed (afib). 02/14/21   Lennette Bihari, MD  Multiple Vitamins-Minerals (PRESERVISION AREDS) CAPS Take 1 capsule by mouth 2 (two) times daily.    [provider]  Polyethyl Glycol-Propyl Glycol 0.4-0.3 % SOLN Place 2 drops into both eyes 2 (two) times daily as needed (for dry eyes).    [provider]  polyethylene glycol powder (MIRALAX) 17 GM/SCOOP powder See admin instructions.    [provider]  potassium chloride SA (KLOR-CON M) 20 MEQ tablet Take 1 tablet (20 mEq total) by mouth 2 (two) times daily. Patient not taking: Reported on 01/14/2023 04/06/22   Dione Booze, MD  Psyllium (METAMUCIL FIBER SINGLES PO)     [provider]  Rivaroxaban (XARELTO) 15 MG TABS tablet Take 1 tablet (15 mg total) by mouth daily with supper. 01/14/23   Jodelle Gross, NP  Rivaroxaban (XARELTO) 15 MG TABS tablet Take 1 tablet (15 mg total) by mouth 2 (two) times daily with a meal. 01/14/23   Jodelle Gross, NP  rosuvastatin (CRESTOR) 5 MG tablet TAKE 1 TABLET BY MOUTH DAILY AT 6 PM. Please call office to schedule an appt for further refills. Thank you 01/14/23   Jodelle Gross, NP  simethicone (MYLICON) 125 MG chewable tablet Chew 125 mg by mouth every 6 (six) hours as needed for flatulence.    [provider]  sodium chloride (OCEAN) 0.65 % SOLN nasal spray Place 1 spray into both nostrils as needed for congestion.    [provider]  traMADol (ULTRAM) 50 MG tablet Take 1 tablet (50 mg total) by mouth every 6 (six) hours as needed for moderate pain. Patient not taking: Reported on 01/14/2023 04/18/21   Manus Rudd, MD  vitamin B-12 (CYANOCOBALAMIN) 1000 MCG tablet Take 1,000 mcg by mouth daily.    [provider]      Allergies    Ciprofloxacin; Hytrin [terazosin]; Antihistamines, chlorpheniramine-type; Doxycycline; and Pseudoephedrine    Review of Systems   Review of Systems  Cardiovascular:  Positive for chest pain.    Physical Exam Updated Vital Signs BP (!) 148/64   Pulse 60   Temp 98.9 F (37.2 C) (Oral)   Resp (!) 21   SpO2 96%  Physical Exam Vitals and nursing note reviewed.  Constitutional:      General: He is not in acute distress.    Appearance: He is well-developed.  HENT:     Head: Normocephalic.  Eyes:      Extraocular Movements: Extraocular movements intact.  Cardiovascular:     Rate and Rhythm: Normal rate and regular rhythm.     Heart sounds: Normal heart sounds.  Pulmonary:     Effort: Pulmonary effort is normal.     Breath sounds: Normal breath sounds.  Chest:     Chest wall: No tenderness.  Abdominal:     Palpations: Abdomen is soft.     Tenderness: There is no abdominal tenderness.  Musculoskeletal:        General: Normal range of motion.     Cervical back: Normal range of motion and neck supple.     Right lower leg: No edema.  Left lower leg: No edema.  Skin:    General: Skin is warm and dry.  Neurological:     General: No focal deficit present.     Mental Status: He is alert and oriented to person, place, and time.  Psychiatric:        Mood and Affect: Mood normal.        Behavior: Behavior normal.     ED Results / Procedures / Treatments   Labs (all labs ordered are listed, but only abnormal results are displayed) Labs Reviewed  BASIC METABOLIC PANEL - Abnormal; Notable for the following components:      Result Value   Glucose, Bld 107 (*)    BUN 33 (*)    Creatinine, Ser 1.61 (*)    Calcium 8.7 (*)    GFR, Estimated 42 (*)    All other components within normal limits  CBC - Abnormal; Notable for the following components:   RBC 3.22 (*)    Hemoglobin 11.2 (*)    HCT 34.1 (*)    MCV 105.9 (*)    MCH 34.8 (*)    All other components within normal limits  TROPONIN I (HIGH SENSITIVITY)  TROPONIN I (HIGH SENSITIVITY)    EKG EKG Interpretation Date/Time:  Friday February 21 2023 10:07:06 EDT Ventricular Rate:  60 PR Interval:    QRS Duration:  105 QT Interval:  434 QTC Calculation: 434 R Axis:   146  Text Interpretation: Right and left arm electrode reversal, interpretation assumes no reversal Atrial flutter Probable lateral infarct, age indeterminate  Now in atrial flutter, otherwise no significant change from prior EKG Confirmed by Elayne Snare ([id number]) on 02/21/2023 10:11:32 AM  Radiology DG Chest 2 View  Result Date: 02/21/2023 CLINICAL DATA:  82 year old male with chest pain.  Former smoker. EXAM: CHEST - 2 VIEW COMPARISON:  04/05/2022 chest radiographs and earlier. FINDINGS: AP and lateral views at 1044 hours. Stable lung volumes and mediastinal contours. Mild chronic cardiomegaly. Visualized tracheal air column is within normal limits. No pneumothorax. No pulmonary edema, pleural effusion, or consolidation. Coarse chronic pulmonary interstitial markings do not appear significantly changed. No acute osseous abnormality identified. Negative visible bowel gas. IMPRESSION: No acute cardiopulmonary abnormality. Chronic mild cardiomegaly and coarse pulmonary interstitial changes. Electronically Signed   By: Odessa Fleming M.D.   On: 02/21/2023 10:59    Procedures Procedures    Medications Ordered in ED Medications - No data to display  ED Course/ Medical Decision Making/ A&P Clinical Course as of 02/21/23 1537  Fri Feb 21, 2023  1125 Cr at baseline. Initial troponin negative. Will need repeat troponin ~12:15  [VK]  1500 Repeat troponin negative, patient well-appearing without acute complaints and is stable for discharge home.  He states he has outpatient cardiology follow-up and was given strict return precautions. [VK]    Clinical Course User Index [VK] Rexford Maus, DO                                 Medical Decision Making This patient presents to the ED with chief complaint(s) of chest pain with pertinent past medical history of A-fib on Xarelto, hypertension, hypothyroid which further complicates the presenting complaint. The complaint involves an extensive differential diagnosis and also carries with it a high risk of complications and morbidity.    The differential diagnosis includes ACS, arrhythmia, anemia, pneumonia, pneumothorax, pulmonary edema, pleural effusion, gastritis, GERD  Additional  history  obtained: Additional history obtained from spouse and EMS  Records reviewed outpatient cardiology records  ED Course and Reassessment: On patient's arrival he is hemodynamically stable in no acute distress.  EKG on arrival showed rate controlled A-fib without acute ischemic changes.  Patient will have labs including troponin and chest x-ray performed.  He declines any additional pain medication at this time and will be closely reassessed.  Independent labs interpretation:  The following labs were independently interpreted: at baseline/within normal range  Independent visualization of imaging: - I independently visualized the following imaging with scope of interpretation limited to determining acute life threatening conditions related to emergency care: CXR, which revealed no acute disease  Consultation: - Consulted or discussed management/test interpretation w/ external professional: N/A  Consideration for admission or further workup: Patient has no emergent conditions requiring admission or further work-up at this time and is stable for discharge home with primary care and cardiology follow-up  Social Determinants of health: N/A   Amount and/or Complexity of Data Reviewed Labs: ordered. Radiology: ordered.          Final Clinical Impression(s) / ED Diagnoses Final diagnoses:  Nonspecific chest pain    Rx / DC Orders ED Discharge Orders     None         Rexford Maus, DO 02/21/23 1537

## 2023-02-21 NOTE — ED Triage Notes (Signed)
Pt BIB [facility] with reports of chest pain upon waking. Pt reports his chest started hurting when he walked to the bathroom. Pt was given 1 nitroglycerin and 324 ASA en route.

## 2023-02-27 DIAGNOSIS — T1511XA Foreign body in conjunctival sac, right eye, initial encounter: Secondary | ICD-10-CM | POA: Diagnosis not present

## 2023-02-27 DIAGNOSIS — T1512XA Foreign body in conjunctival sac, left eye, initial encounter: Secondary | ICD-10-CM | POA: Diagnosis not present

## 2023-02-28 DIAGNOSIS — I480 Paroxysmal atrial fibrillation: Secondary | ICD-10-CM | POA: Diagnosis not present

## 2023-02-28 DIAGNOSIS — N1832 Chronic kidney disease, stage 3b: Secondary | ICD-10-CM | POA: Diagnosis not present

## 2023-02-28 DIAGNOSIS — I129 Hypertensive chronic kidney disease with stage 1 through stage 4 chronic kidney disease, or unspecified chronic kidney disease: Secondary | ICD-10-CM | POA: Diagnosis not present

## 2023-02-28 DIAGNOSIS — D631 Anemia in chronic kidney disease: Secondary | ICD-10-CM | POA: Diagnosis not present

## 2023-03-06 DIAGNOSIS — D0439 Carcinoma in situ of skin of other parts of face: Secondary | ICD-10-CM | POA: Diagnosis not present

## 2023-03-21 DIAGNOSIS — Z23 Encounter for immunization: Secondary | ICD-10-CM | POA: Diagnosis not present

## 2023-03-25 ENCOUNTER — Telehealth: Payer: Self-pay

## 2023-03-25 NOTE — Telephone Encounter (Signed)
Transition Care Management Unsuccessful Follow-up Telephone Call  Date of discharge and from where:  02/21/2023 [facility]  Attempts:  1st Attempt  Reason for unsuccessful TCM follow-up call:  No answer/busy  Maite Burlison [facility]  [facility], [facility] Resource Care Guide Direct Dial: [phone]  Website: [email or web address]

## 2023-03-27 ENCOUNTER — Telehealth: Payer: Self-pay

## 2023-03-27 NOTE — Telephone Encounter (Signed)
Transition Care Management Unsuccessful Follow-up Telephone Call  Date of discharge and from where:  02/21/2023 [facility]  Attempts:  2nd Attempt  Reason for unsuccessful TCM follow-up call:  No answer/busy  Ronak Duquette [facility]  [facility], [facility] Resource Care Guide Direct Dial: [phone]  Website: [email or web address]

## 2023-04-17 DIAGNOSIS — H353134 Nonexudative age-related macular degeneration, bilateral, advanced atrophic with subfoveal involvement: Secondary | ICD-10-CM | POA: Diagnosis not present

## 2023-04-17 DIAGNOSIS — H43813 Vitreous degeneration, bilateral: Secondary | ICD-10-CM | POA: Diagnosis not present

## 2023-05-05 DIAGNOSIS — R2681 Unsteadiness on feet: Secondary | ICD-10-CM | POA: Diagnosis not present

## 2023-05-05 DIAGNOSIS — M6281 Muscle weakness (generalized): Secondary | ICD-10-CM | POA: Diagnosis not present

## 2023-05-05 DIAGNOSIS — I70203 Unspecified atherosclerosis of native arteries of extremities, bilateral legs: Secondary | ICD-10-CM | POA: Diagnosis not present

## 2023-05-05 DIAGNOSIS — R262 Difficulty in walking, not elsewhere classified: Secondary | ICD-10-CM | POA: Diagnosis not present

## 2023-07-14 DIAGNOSIS — M79675 Pain in left toe(s): Secondary | ICD-10-CM | POA: Diagnosis not present

## 2023-07-14 DIAGNOSIS — L603 Nail dystrophy: Secondary | ICD-10-CM | POA: Diagnosis not present

## 2023-07-14 DIAGNOSIS — I70203 Unspecified atherosclerosis of native arteries of extremities, bilateral legs: Secondary | ICD-10-CM | POA: Diagnosis not present

## 2023-07-14 DIAGNOSIS — B351 Tinea unguium: Secondary | ICD-10-CM | POA: Diagnosis not present

## 2023-07-14 DIAGNOSIS — L84 Corns and callosities: Secondary | ICD-10-CM | POA: Diagnosis not present

## 2023-07-21 ENCOUNTER — Telehealth: Payer: Self-pay | Admitting: Cardiovascular Disease

## 2023-07-21 NOTE — Telephone Encounter (Signed)
Spoke to patient's wife she stated husband has had irregular heart beat off and on for the past 2 weeks.Yesterday B/P 135/55 pulse 64.She requested appointment.Appointment scheduled with Edd Fabian NP 2/17 at 8:25 am.

## 2023-07-21 NOTE — Telephone Encounter (Signed)
Patient c/o Palpitations:  STAT if patient reporting lightheadedness, shortness of breath, or chest pain  How long have you had palpitations/irregular HR/ Afib? Wife called stating he has been in and out of A-fib for the past week. Are you having the symptoms now? yes  Are you currently experiencing lightheadedness, SOB or CP? no  Do you have a history of afib (atrial fibrillation) or irregular heart rhythm? Yes   Have you checked your BP or HR? (document readings if available): yesterday 135/55 HR 64  Are you experiencing any other symptoms? Wife stated he hasn't mentioned no other symptoms.

## 2023-07-22 ENCOUNTER — Ambulatory Visit: Payer: Medicare Other | Attending: General Practice | Admitting: Emergency Medicine

## 2023-07-22 ENCOUNTER — Encounter: Payer: Self-pay | Admitting: General Practice

## 2023-07-22 VITALS — BP 120/50 | HR 77 | Ht 66.0 in | Wt 150.0 lb

## 2023-07-22 DIAGNOSIS — I341 Nonrheumatic mitral (valve) prolapse: Secondary | ICD-10-CM | POA: Insufficient documentation

## 2023-07-22 DIAGNOSIS — I480 Paroxysmal atrial fibrillation: Secondary | ICD-10-CM | POA: Insufficient documentation

## 2023-07-22 DIAGNOSIS — E782 Mixed hyperlipidemia: Secondary | ICD-10-CM | POA: Insufficient documentation

## 2023-07-22 DIAGNOSIS — I10 Essential (primary) hypertension: Secondary | ICD-10-CM | POA: Insufficient documentation

## 2023-07-22 LAB — BASIC METABOLIC PANEL
BUN/Creatinine Ratio: 18 (ref 10–24)
BUN: 31 mg/dL — ABNORMAL HIGH (ref 8–27)
CO2: 26 mmol/L (ref 20–29)
Calcium: 9.9 mg/dL (ref 8.6–10.2)
Chloride: 99 mmol/L (ref 96–106)
Creatinine, Ser: 1.74 mg/dL — ABNORMAL HIGH (ref 0.76–1.27)
Glucose: 127 mg/dL — ABNORMAL HIGH (ref 70–99)
Potassium: 4.7 mmol/L (ref 3.5–5.2)
Sodium: 140 mmol/L (ref 134–144)
eGFR: 38 mL/min/{1.73_m2} — ABNORMAL LOW (ref >59)

## 2023-07-22 LAB — CBC
Hematocrit: 36.7 % — ABNORMAL LOW (ref 37.5–51.0)
Hemoglobin: 12.9 g/dL — ABNORMAL LOW (ref 13.0–17.7)
MCH: 36.2 pg — ABNORMAL HIGH (ref 26.6–33.0)
MCHC: 35.1 g/dL (ref 31.5–35.7)
MCV: 103 fL — ABNORMAL HIGH (ref 79–97)
Platelets: 196 10*3/uL (ref 150–450)
RBC: 3.56 x10E6/uL — ABNORMAL LOW (ref 4.14–5.80)
RDW: 11.8 % (ref 11.6–15.4)
WBC: 6.8 10*3/uL (ref 3.4–10.8)

## 2023-07-22 LAB — TSH: TSH: 3.98 u[IU]/mL (ref 0.450–4.500)

## 2023-07-22 LAB — MAGNESIUM: Magnesium: 2.2 mg/dL (ref 1.6–2.3)

## 2023-07-22 NOTE — Patient Instructions (Signed)
Medication Instructions:  The current medical regimen is effective;  continue present plan and medications as directed. Please refer to the Current Medication list given to you today.  *If you need a refill on your cardiac medications before your next appointment, please call your pharmacy*  Lab Work: TSH CBC BMET AND MAG TODAY If you have labs (blood work) drawn today and your tests are completely normal, you will receive your results only by:  MyChart Message (if you have MyChart) OR  A paper copy in the mail If you have any lab test that is abnormal or we need to change your treatment, we will call you to review the results.  Testing/Procedures: Echocardiogram - Your physician has requested that you have an echocardiogram. Echocardiography is a painless test that uses sound waves to create images of your heart. It provides your doctor with information about the size and shape of your heart and how well your heart's chambers and valves are working. This procedure takes approximately one hour. There are no restrictions for this procedure.    Follow-Up: At [facility], you and your health needs are our priority.  As part of our continuing mission to provide you with exceptional heart care, we have created designated Provider Care Teams.  These Care Teams include your primary Cardiologist (physician) and Advanced Practice Providers (APPs -  Physician Assistants and Nurse Practitioners) who all work together to provide you with the care you need, when you need it.  Your next appointment:   KEEP SCHEDULED APPOINTMENT   Provider:   Nicki Guadalajara, MD

## 2023-07-22 NOTE — Progress Notes (Signed)
Cardiology Office Note:    Date:  07/22/2023  ID:  CRUISE BAUMGARDNER, DOB [birth year], MRN [id number] PCP: Merri Brunette, MD  [facility] Providers Cardiologist:  Nicki Guadalajara, MD       Patient Profile:      Tanner Ho is a 83 y.o. male with visit-pertinent history of mitral valve prolapse, hypertension, hyperlipidemia, paroxysmal atrial fibrillation on Xarelto, chronic kidney disease stage IIIb, hypothyroidism  Nuclear perfusion study October 2013 showed normal perfusion with 1 to 2 mm ST segment depression at peak stress and possibility of microvascular etiology without EKG abnormalities.  Nuclear perfusion study November 2014 was low risk with mild diaphragmatic attenuation but no scar or ischemia.  Cardiac monitor 2015 with episodes of PACs, bradycardia, 6 beat run wide-complex tachycardia 131 bpm with breakthrough atrial fibrillation while on amiodarone 100 mg.  He had been maintaining sinus rhythm and was without with recurrent atrial fibrillation until July 2017 when he developed recurrent atrial fibrillation with rapid ventricular response and was evaluated on 2 consecutive evenings in the emergency room.  He again developed recurrent atrial fibrillation in April 2018 and and ultimately pharmacologically converted back to sinus rhythm.  It was recommended reduction amiodarone down to 200 mg alternating with 100 mg every other day due to bradycardia.  Echocardiogram April 2018 showed LVEF 55 to 60%, no RWMA, mild AI, mild late systolic bileaflet prolapse of the mitral valve.    He was last seen in clinic on 01/14/2023 and was doing well at the time and no medication changes were made.  He was seen in the ED on 02/21/2023 for nonspecific chest pain.  High-sensitivity troponins were negative.  EKG did show rate controlled atrial fibrillation.  He called into the nurse triage line on 07/21/2023 and noted irregular heartbeat for the past 2 weeks and OV was scheduled.      History of  Present Illness:  Discussed the use of AI scribe software for clinical note transcription with the patient, who gave verbal consent to proceed.  Tanner Ho is a 83 y.o. male who returns for acute visit for irregular heart rate.  Patient presents today with his wife.  He comes in with a feeling of irregular heartbeats for several months or maybe even longer.  He notes that this feeling is rare and he may feel it once to twice a month just for a second at a time.  He notes the reason he did come in today is because he was afraid he was going in and out of atrial fibrillation because him and his wife started taking his blood pressure around 3 weeks ago and the blood pressure cuff was noting his heart rate was abnormal.  He notes he is entirely asymptomatic without syncope, shortness of breath, chest pain, dizziness, lightheadedness, leg swelling.  Patient does have a history of falls and uses a walker at home for mobility and a wheelchair outside of home.  Patient also tells me that he is legally blind and spends most of his time indoors.  He does walk around the house with his walker at home for exercise.    Review of Systems  Constitutional: Negative for weight gain and weight loss.  Cardiovascular:  Positive for irregular heartbeat. Negative for chest pain, claudication, dyspnea on exertion, leg swelling, near-syncope, orthopnea, palpitations, paroxysmal nocturnal dyspnea and syncope.  Respiratory:  Negative for cough, hemoptysis and shortness of breath.   Gastrointestinal:  Negative for abdominal pain, hematochezia and melena.  Genitourinary:  Negative for hematuria.  Neurological:  Negative for dizziness and light-headedness.     See HPI     Home Medications:    Prior to Admission medications   Medication Sig Start Date End Date Taking? Authorizing Provider  acetaminophen (TYLENOL) 325 MG tablet Take 650 mg by mouth every 6 (six) hours as needed for moderate pain.    [provider]  acetaminophen (TYLENOL) 650 MG CR tablet Take 650 mg by mouth at bedtime as needed for pain.    [provider]  alfuzosin (UROXATRAL) 10 MG 24 hr tablet Take 10 mg by mouth daily.    [provider]  amiodarone (PACERONE) 200 MG tablet Take 1 tablet (200 mg total) by mouth daily. 01/14/23   Jodelle Gross, NP  amoxicillin (AMOXIL) 500 MG capsule Take 2 capsules (1,000 mg total) by mouth 2 (two) times daily. Patient not taking: Reported on 01/14/2023 04/06/22   Dione Booze, MD  ascorbic acid (VITAMIN C) 1000 MG tablet Take 1,000 mg by mouth daily.    [provider]  benzonatate (TESSALON) 100 MG capsule Take 1 capsule (100 mg total) by mouth every 8 (eight) hours. Patient not taking: Reported on 01/14/2023 04/06/22   Dione Booze, MD  chlorthalidone (HYGROTON) 25 MG tablet Take 0.5 tablets (12.5 mg total) by mouth daily. 01/14/23   Jodelle Gross, NP  Cholecalciferol (VITAMIN D) 50 MCG (2000 UT) tablet Take 2,000 Units by mouth daily.    [provider]  docusate sodium (COLACE) 100 MG capsule Take 100 mg by mouth 2 (two) times daily.    [provider]  finasteride (PROSCAR) 5 MG tablet Take 5 mg by mouth daily.    [provider]  hydrocortisone 2.5 % cream Apply 1 application topically as needed (itching). 08/12/19   [provider]  ketoconazole (NIZORAL) 2 % cream Apply 1 application topically daily as needed for irritation.    [provider]  levothyroxine (SYNTHROID) 75 MCG tablet Take 75 mcg by mouth daily before breakfast. 04/22/14   [provider]  linaclotide (LINZESS) 145 MCG CAPS capsule Take 1 capsule (145 mcg total) by mouth as needed. 06/23/20   Zehr, Princella Pellegrini, PA-C  loratadine (CLARITIN) 10 MG tablet Take 10 mg by mouth daily.    [provider]  losartan (COZAAR) 25 MG tablet Take 1 tablet (25 mg total) by mouth daily. 01/14/23   Jodelle Gross, NP  loteprednol  (LOTEMAX) 0.5 % ophthalmic suspension Place 1 drop into the right eye daily. 10/05/20 10/05/21  Rankin, Alford Highland, MD  melatonin 3 MG TABS tablet Take 3 mg by mouth at bedtime.    [provider]  metoprolol tartrate (LOPRESSOR) 25 MG tablet Take 0.5 tablets (12.5 mg total) by mouth 2 (two) times daily as needed (afib). 02/14/21   Lennette Bihari, MD  Multiple Vitamins-Minerals (PRESERVISION AREDS) CAPS Take 1 capsule by mouth 2 (two) times daily.    [provider]  Polyethyl Glycol-Propyl Glycol 0.4-0.3 % SOLN Place 2 drops into both eyes 2 (two) times daily as needed (for dry eyes).    [provider]  polyethylene glycol powder (MIRALAX) 17 GM/SCOOP powder See admin instructions.    [provider]  potassium chloride SA (KLOR-CON M) 20 MEQ tablet Take 1 tablet (20 mEq total) by mouth 2 (two) times daily. Patient not taking: Reported on 01/14/2023 04/06/22   Dione Booze, MD  Psyllium (METAMUCIL FIBER SINGLES PO)  [provider]  Rivaroxaban (XARELTO) 15 MG TABS tablet Take 1 tablet (15 mg total) by mouth daily with supper. 01/14/23   Jodelle Gross, NP  Rivaroxaban (XARELTO) 15 MG TABS tablet Take 1 tablet (15 mg total) by mouth 2 (two) times daily with a meal. 01/14/23   Jodelle Gross, NP  rosuvastatin (CRESTOR) 5 MG tablet TAKE 1 TABLET BY MOUTH DAILY AT 6 PM. Please call office to schedule an appt for further refills. Thank you 01/14/23   Jodelle Gross, NP  simethicone (MYLICON) 125 MG chewable tablet Chew 125 mg by mouth every 6 (six) hours as needed for flatulence.    [provider]  sodium chloride (OCEAN) 0.65 % SOLN nasal spray Place 1 spray into both nostrils as needed for congestion.    [provider]  traMADol (ULTRAM) 50 MG tablet Take 1 tablet (50 mg total) by mouth every 6 (six) hours as needed for moderate pain. Patient not taking: Reported on 01/14/2023 04/18/21   Manus Rudd, MD  vitamin B-12  (CYANOCOBALAMIN) 1000 MCG tablet Take 1,000 mcg by mouth daily.    [provider]   Studies Reviewed:   EKG Interpretation Date/Time:  Tuesday July 22 2023 08:47:41 EST Ventricular Rate:  77 PR Interval:  140 QRS Duration:  90 QT Interval:  480 QTC Calculation: 543 R Axis:   -39  Text Interpretation: Sinus rhythm with Premature atrial complexes Left axis deviation ST & T wave abnormality, consider lateral ischemia Prolonged QT Confirmed by Rise Paganini [phone]) on 07/22/2023 12:26:20 PM    Echocardiogram 09/04/2016 - Left ventricle: The cavity size was normal. Wall thickness was    increased in a pattern of moderate LVH. Systolic function was    normal. The estimated ejection fraction was in the range of 55%    to 60%. Wall motion was normal; there were no regional wall    motion abnormalities. The study is not technically sufficient to    allow evaluation of LV diastolic function.  - Aortic valve: Trileaflet; mildly thickened leaflets. There was    mild regurgitation.  - Aorta: Aortic root dimension: 39 mm (ED).  - Aortic root: The aortic root is mildly dilated.  - Mitral valve: Mild late systolic bileaflet prolapse. There is    mild regurgitation. Mildly thickened leaflets .  - Left atrium: The atrium was normal in size.  - Atrial septum: No defect or patent foramen ovale was identified.  - Tricuspid valve: There was trivial regurgitation.  - Pulmonary arteries: PA peak pressure: 39 mm Hg (S).  - Inferior vena cava: The vessel was normal in size. The    respirophasic diameter changes were in the normal range (= 50%),    consistent with normal central venous pressure.  Risk Assessment/Calculations:    CHA2DS2-VASc Score = 3   This indicates a 3.2% annual risk of stroke. The patient's score is based upon: CHF History: 0 HTN History: 1 Diabetes History: 0 Stroke History: 0 Vascular Disease History: 0 Age Score: 2 Gender Score: 0            Physical  Exam:   VS:  BP (!) 120/50   Pulse 77   Ht 5\' 6"  (1.676 m)   Wt 150 lb (68 kg)   SpO2 100%   BMI 24.21 kg/m    Wt Readings from Last 3 Encounters:  07/22/23 150 lb (68 kg)  01/14/23 159 lb 9.6 oz (72.4 kg)  04/05/22 158 lb 15.2 oz (  72.1 kg)    Constitutional:      Appearance: Normal and healthy appearance. Not in distress.  Neck:     Vascular: JVD normal.  Pulmonary:     Effort: Pulmonary effort is normal.     Breath sounds: Normal breath sounds.  Chest:     Chest wall: Not tender to palpatation.  Cardiovascular:     PMI at left midclavicular line. Normal rate. Regular rhythm. Normal S1. Normal S2.      Murmurs: There is a grade 2/6 systolic murmur.     No gallop.  No click. No rub.  Pulses:    Intact distal pulses.  Edema:    Peripheral edema absent.  Musculoskeletal: Normal range of motion.     Cervical back: Normal range of motion and neck supple. Skin:    General: Skin is warm and dry.  Neurological:     General: No focal deficit present.     Mental Status: Alert, oriented to person, place, and time and oriented to person, place and time.  Psychiatric:        Mood and Affect: Mood and affect normal.        Behavior: Behavior is cooperative.        Thought Content: Thought content normal.        Assessment and Plan:  Paroxysmal atrial fibrillation Patient notes feelings of irregular heart rate for several months to even greater than a year.  He notes irregular heart rate is "rare" and occurs maybe once to twice a month lasting less than 1 second.   Patient noted he began to take his blood pressure 3 weeks ago at home and his blood pressure monitor had noted his heart rate to be irregular Patient notes that he is overall asymptomatic with no symptoms of recurrent atrial fibrillation.  He denies palpitations, tachycardia, syncope, near syncope, fall  -EKG today shows normal sinus rhythm heart rate 77 bpm with frequent PACs -Given he is overall asymptomatic and with  no reoccurrence of atrial fibrillation we will continue with current medication regimen with no changes.  Did discuss with patient regarding heart monitor for A-fib/PAC burden however we agreed he is on current appropriate medication regimen and addition of further medication would increase chance of falls and polypharmacy -Ordered TSH, magnesium, BMP, CBC to rule out further abnormalities -Plan for echocardiogram for reassessment of LVEF.  Previous EF 65% in 2018 -Continue amiodarone 200 mg daily and metoprolol tartrate 12.5 mg twice daily as needed -Continue Xarelto 15 mg daily  CHA2DS2-VASc Score = 3 [CHF History: 0, HTN History: 1, Diabetes History: 0, Stroke History: 0, Vascular Disease History: 0, Age Score: 2, Gender Score: 0].  Therefore, the patient's annual risk of stroke is 3.2 %.      Mitral valve prolapse / Mild mitral valve regurgitation Most recent echocardiogram in 2018 shows mild late systolic bileaflet prolapse with mild regurgitation He is currently asymptomatic -Plan for repeat echocardiogram as above  Hyperlipidemia LDL 61 on 11/27/2022 and under excellent control -Heart healthy dieting recommended -Continue rosuvastatin 5 mg daily  Hypertension Blood pressure today 120/50 and under good control.  Wife brings in patient's daily blood pressure log showing average daily blood pressure ranges between 130-140 systolic.  He is without any signs or symptoms of hypotension. -Continue monitoring blood pressure monitoring at home -Continue chlorthalidone 12.5 mg daily and losartan 25 mg daily          Dispo: Follow-up with scheduled visit with Dr. Tresa Endo in  June 2025.  Signed, Denyce Robert, NP

## 2023-07-24 DIAGNOSIS — J069 Acute upper respiratory infection, unspecified: Secondary | ICD-10-CM | POA: Diagnosis not present

## 2023-07-24 DIAGNOSIS — R051 Acute cough: Secondary | ICD-10-CM | POA: Diagnosis not present

## 2023-08-08 DIAGNOSIS — D1801 Hemangioma of skin and subcutaneous tissue: Secondary | ICD-10-CM | POA: Diagnosis not present

## 2023-08-08 DIAGNOSIS — Z08 Encounter for follow-up examination after completed treatment for malignant neoplasm: Secondary | ICD-10-CM | POA: Diagnosis not present

## 2023-08-08 DIAGNOSIS — R229 Localized swelling, mass and lump, unspecified: Secondary | ICD-10-CM | POA: Diagnosis not present

## 2023-08-08 DIAGNOSIS — L570 Actinic keratosis: Secondary | ICD-10-CM | POA: Diagnosis not present

## 2023-08-08 DIAGNOSIS — L821 Other seborrheic keratosis: Secondary | ICD-10-CM | POA: Diagnosis not present

## 2023-08-08 DIAGNOSIS — Z85828 Personal history of other malignant neoplasm of skin: Secondary | ICD-10-CM | POA: Diagnosis not present

## 2023-08-08 DIAGNOSIS — C44329 Squamous cell carcinoma of skin of other parts of face: Secondary | ICD-10-CM | POA: Diagnosis not present

## 2023-08-27 ENCOUNTER — Ambulatory Visit (HOSPITAL_COMMUNITY)
Admission: RE | Admit: 2023-08-27 | Discharge: 2023-08-27 | Disposition: A | Discharge status: Home / Self Care | Payer: Medicare Other | Source: Ambulatory Visit | Attending: Emergency Medicine | Admitting: Emergency Medicine

## 2023-08-27 DIAGNOSIS — I341 Nonrheumatic mitral (valve) prolapse: Secondary | ICD-10-CM | POA: Diagnosis not present

## 2023-08-27 LAB — ECHOCARDIOGRAM COMPLETE
AR max vel: 2.81 cm2
AV Area VTI: 3.12 cm2
AV Area mean vel: 2.81 cm2
AV Mean grad: 4 mmHg
AV Peak grad: 8 mmHg
AV Vena cont: 0.3 cm
Ao pk vel: 1.41 m/s
Area-P 1/2: 2.87 cm2
MV M vel: 1.34 m/s
MV Peak grad: 7.2 mmHg
P 1/2 time: 884 ms
S' Lateral: 2.11 cm

## 2023-09-03 DIAGNOSIS — N281 Cyst of kidney, acquired: Secondary | ICD-10-CM | POA: Diagnosis not present

## 2023-09-03 DIAGNOSIS — R3914 Feeling of incomplete bladder emptying: Secondary | ICD-10-CM | POA: Diagnosis not present

## 2023-09-03 DIAGNOSIS — N401 Enlarged prostate with lower urinary tract symptoms: Secondary | ICD-10-CM | POA: Diagnosis not present

## 2023-09-12 ENCOUNTER — Other Ambulatory Visit: Payer: Self-pay

## 2023-09-12 ENCOUNTER — Observation Stay (HOSPITAL_COMMUNITY)
Admission: EM | Admit: 2023-09-12 | Discharge: 2023-09-14 | Disposition: A | Discharge status: Home / Self Care | Attending: Internal Medicine | Admitting: Internal Medicine

## 2023-09-12 ENCOUNTER — Emergency Department (HOSPITAL_COMMUNITY)

## 2023-09-12 ENCOUNTER — Encounter (HOSPITAL_COMMUNITY): Payer: Self-pay | Admitting: Internal Medicine

## 2023-09-12 DIAGNOSIS — Z79899 Other long term (current) drug therapy: Secondary | ICD-10-CM | POA: Diagnosis not present

## 2023-09-12 DIAGNOSIS — N390 Urinary tract infection, site not specified: Principal | ICD-10-CM | POA: Insufficient documentation

## 2023-09-12 DIAGNOSIS — N183 Chronic kidney disease, stage 3 unspecified: Secondary | ICD-10-CM | POA: Diagnosis present

## 2023-09-12 DIAGNOSIS — I480 Paroxysmal atrial fibrillation: Secondary | ICD-10-CM | POA: Diagnosis present

## 2023-09-12 DIAGNOSIS — I959 Hypotension, unspecified: Secondary | ICD-10-CM | POA: Diagnosis not present

## 2023-09-12 DIAGNOSIS — R339 Retention of urine, unspecified: Secondary | ICD-10-CM | POA: Diagnosis not present

## 2023-09-12 DIAGNOSIS — D519 Vitamin B12 deficiency anemia, unspecified: Secondary | ICD-10-CM | POA: Insufficient documentation

## 2023-09-12 DIAGNOSIS — R531 Weakness: Secondary | ICD-10-CM | POA: Diagnosis not present

## 2023-09-12 DIAGNOSIS — N400 Enlarged prostate without lower urinary tract symptoms: Secondary | ICD-10-CM | POA: Diagnosis present

## 2023-09-12 DIAGNOSIS — Z7901 Long term (current) use of anticoagulants: Secondary | ICD-10-CM | POA: Insufficient documentation

## 2023-09-12 DIAGNOSIS — Z87891 Personal history of nicotine dependence: Secondary | ICD-10-CM | POA: Insufficient documentation

## 2023-09-12 DIAGNOSIS — R2689 Other abnormalities of gait and mobility: Secondary | ICD-10-CM | POA: Diagnosis not present

## 2023-09-12 DIAGNOSIS — N1832 Chronic kidney disease, stage 3b: Secondary | ICD-10-CM | POA: Diagnosis not present

## 2023-09-12 DIAGNOSIS — I10 Essential (primary) hypertension: Secondary | ICD-10-CM | POA: Diagnosis present

## 2023-09-12 DIAGNOSIS — E785 Hyperlipidemia, unspecified: Secondary | ICD-10-CM | POA: Insufficient documentation

## 2023-09-12 DIAGNOSIS — E039 Hypothyroidism, unspecified: Secondary | ICD-10-CM | POA: Diagnosis not present

## 2023-09-12 DIAGNOSIS — I129 Hypertensive chronic kidney disease with stage 1 through stage 4 chronic kidney disease, or unspecified chronic kidney disease: Secondary | ICD-10-CM | POA: Insufficient documentation

## 2023-09-12 DIAGNOSIS — N3000 Acute cystitis without hematuria: Secondary | ICD-10-CM

## 2023-09-12 LAB — COMPREHENSIVE METABOLIC PANEL WITH GFR
ALT: 13 U/L (ref 0–44)
AST: 19 U/L (ref 15–41)
Albumin: 3.6 g/dL (ref 3.5–5.0)
Alkaline Phosphatase: 139 U/L — ABNORMAL HIGH (ref 38–126)
Anion gap: 8 (ref 5–15)
BUN: 30 mg/dL — ABNORMAL HIGH (ref 8–23)
CO2: 29 mmol/L (ref 22–32)
Calcium: 9.4 mg/dL (ref 8.9–10.3)
Chloride: 100 mmol/L (ref 98–111)
Creatinine, Ser: 1.49 mg/dL — ABNORMAL HIGH (ref 0.61–1.24)
GFR, Estimated: 46 mL/min — ABNORMAL LOW (ref >60)
Glucose, Bld: 144 mg/dL — ABNORMAL HIGH (ref 70–99)
Potassium: 3.9 mmol/L (ref 3.5–5.1)
Sodium: 137 mmol/L (ref 135–145)
Total Bilirubin: 0.7 mg/dL (ref 0.0–1.2)
Total Protein: 6.9 g/dL (ref 6.5–8.1)

## 2023-09-12 LAB — TROPONIN I (HIGH SENSITIVITY)
Troponin I (High Sensitivity): 23 ng/L — ABNORMAL HIGH (ref <18)
Troponin I (High Sensitivity): 20 ng/L — ABNORMAL HIGH (ref <18)

## 2023-09-12 LAB — CBC WITH DIFFERENTIAL/PLATELET
Abs Immature Granulocytes: 0.04 10*3/uL (ref 0.00–0.07)
Basophils Absolute: 0 10*3/uL (ref 0.0–0.1)
Basophils Relative: 0 %
Eosinophils Absolute: 0.1 10*3/uL (ref 0.0–0.5)
Eosinophils Relative: 1 %
HCT: 37.3 % — ABNORMAL LOW (ref 39.0–52.0)
Hemoglobin: 12.2 g/dL — ABNORMAL LOW (ref 13.0–17.0)
Immature Granulocytes: 1 %
Lymphocytes Relative: 9 %
Lymphs Abs: 0.8 10*3/uL (ref 0.7–4.0)
MCH: 34.5 pg — ABNORMAL HIGH (ref 26.0–34.0)
MCHC: 32.7 g/dL (ref 30.0–36.0)
MCV: 105.4 fL — ABNORMAL HIGH (ref 80.0–100.0)
Monocytes Absolute: 0.8 10*3/uL (ref 0.1–1.0)
Monocytes Relative: 9 %
Neutro Abs: 7.1 10*3/uL (ref 1.7–7.7)
Neutrophils Relative %: 80 %
Platelets: 189 10*3/uL (ref 150–400)
RBC: 3.54 MIL/uL — ABNORMAL LOW (ref 4.22–5.81)
RDW: 12.8 % (ref 11.5–15.5)
WBC: 8.9 10*3/uL (ref 4.0–10.5)
nRBC: 0 % (ref 0.0–0.2)

## 2023-09-12 LAB — URINALYSIS, W/ REFLEX TO CULTURE (INFECTION SUSPECTED)
Bilirubin Urine: NEGATIVE
Glucose, UA: NEGATIVE mg/dL
Ketones, ur: NEGATIVE mg/dL
Nitrite: NEGATIVE
Protein, ur: 30 mg/dL — AB
Specific Gravity, Urine: 1.006 (ref 1.005–1.030)
pH: 6 (ref 5.0–8.0)

## 2023-09-12 LAB — I-STAT CG4 LACTIC ACID, ED
Lactic Acid, Venous: 2.1 mmol/L (ref 0.5–1.9)
Lactic Acid, Venous: 0.9 mmol/L (ref 0.5–1.9)

## 2023-09-12 MED ORDER — ACETAMINOPHEN 650 MG RE SUPP: 650.0000 mg | Freq: Four times a day (QID) | RECTAL | Status: DC | PRN

## 2023-09-12 MED ORDER — SODIUM CHLORIDE 0.9 % IV BOLUS
1000.0000 mL | Freq: Once | INTRAVENOUS | Status: AC
  Administered 2023-09-12: 1000 mL via INTRAVENOUS

## 2023-09-12 MED ORDER — SODIUM CHLORIDE 0.9 % IV SOLN: Freq: Once | INTRAVENOUS | Status: AC

## 2023-09-12 MED ORDER — ONDANSETRON HCL 4 MG PO TABS: 4.0000 mg | ORAL_TABLET | Freq: Four times a day (QID) | ORAL | Status: DC | PRN

## 2023-09-12 MED ORDER — ALBUTEROL SULFATE (2.5 MG/3ML) 0.083% IN NEBU: 2.5000 mg | INHALATION_SOLUTION | RESPIRATORY_TRACT | Status: DC | PRN

## 2023-09-12 MED ORDER — SODIUM CHLORIDE 0.9 % IV SOLN
1.0000 g | Freq: Once | INTRAVENOUS | Status: AC
  Administered 2023-09-12: 1 g via INTRAVENOUS
  Filled 2023-09-12: qty 10

## 2023-09-12 MED ORDER — ONDANSETRON HCL 4 MG/2ML IJ SOLN: 4.0000 mg | Freq: Four times a day (QID) | INTRAMUSCULAR | Status: DC | PRN

## 2023-09-12 MED ORDER — SODIUM CHLORIDE 0.9 % IV SOLN
1.0000 g | INTRAVENOUS | Status: DC
  Administered 2023-09-13 – 2023-09-14 (×2): 1 g via INTRAVENOUS
  Filled 2023-09-12 (×2): qty 10

## 2023-09-12 MED ORDER — ACETAMINOPHEN 325 MG PO TABS
650.0000 mg | ORAL_TABLET | Freq: Four times a day (QID) | ORAL | Status: DC | PRN
  Administered 2023-09-12 – 2023-09-14 (×3): 650 mg via ORAL
  Filled 2023-09-12 (×3): qty 2

## 2023-09-12 MED ORDER — TRAZODONE HCL 50 MG PO TABS
25.0000 mg | ORAL_TABLET | Freq: Every evening | ORAL | Status: DC | PRN
  Administered 2023-09-13 (×2): 25 mg via ORAL
  Filled 2023-09-12 (×2): qty 1

## 2023-09-12 MED ORDER — RIVAROXABAN 15 MG PO TABS
15.0000 mg | ORAL_TABLET | Freq: Every day | ORAL | Status: DC
  Administered 2023-09-12: 15 mg via ORAL
  Filled 2023-09-12: qty 1

## 2023-09-12 NOTE — ED Triage Notes (Signed)
 Pt BIBA from home for weakness x2 days. No fever, denies urinary sx. Had some red blood in urein but had a cath Wednesday. One self cath at home. A&Ox4. Hx afib  110/60 HR 75 96% RA CBG 174 RR 20

## 2023-09-12 NOTE — H&P (Signed)
 History and Physical  MUREL SHENBERGER YNW:[id number] DOB: 1940/12/25 DOA: 09/12/2023  PCP: Merri Brunette, MD   Chief Complaint: Weakness, urinary retention  HPI: Tanner Ho is a 83 y.o. male with medical history significant for CKD stage III, BPH, paroxysmal atrial fibrillation on Xarelto admitted to the hospital with weakness and urinary retention.  He has been followed recently by urology, and this past week started intermittent catheterization.  He really has only been catheterize once at home per his wife, she noticed over the last 2 or 3 days that he is becoming progressively weak.  Some mild nausea, no vomiting, no fevers, chills, cough or shortness of breath.  She was also worried that he seemed to be leaking a little bit of urine from his penis, which looked bloody.  They brought him to the ER for evaluation today, he was retaining more than a liter of urine, Foley catheter was placed.  Urinalysis looks concerning for possible infection, he was given empiric IV Rocephin.  Patient is too weak to ambulate, family is concerned about taking him home.  As such, hospitalist observation admission was requested.  Review of Systems: Please see HPI for pertinent positives and negatives. A complete 10 system review of systems are otherwise negative.  Past Medical History:  Diagnosis Date   Adrenal mass (HCC)    Arthritis    Asthma    BPH (benign prostatic hyperplasia)    Cancer (HCC)    Bladder Cancer (early 90's)   CKD (chronic kidney disease), stage III (HCC)    GERD (gastroesophageal reflux disease)    H/O cardiovascular stress test 03/2012   EKG with 1-2 mm ST segment depression but normal perfusion images. Followup metastases test reduced exercise effort and functional status and determinate for myocardial dysfunction   History of cardiac monitoring    a. 2015: cardiac monitor revealed episodes of PACs, bradycardia, with heart rates down to 48, 4 beat PSVT, 6 beat run of NSVT at a rate  of 131. There was no recurrent afib.    Hyperlipidemia LDL goal < 100    Hypertension    Hypothyroidism    Macular degeneration    MVP (mitral valve prolapse)    on cardiac cath - not mentioned on 2014 echo.   NSVT (nonsustained ventricular tachycardia) (HCC)    PAF (paroxysmal atrial fibrillation) (HCC)    last episode 03/2012   Pericarditis    Posterior vitreous detachment of right eye 10/26/2019   Premature atrial contractions    Sinus bradycardia    Subconjunctival hemorrhage of left eye 10/26/2019   The condition of the involved eye is that of a subconjunctival hemorrhage.  These are often spontaneous but are also often at or near the site of low location of an injection should to be placed into the eye.  In the absence of direct blunt or severe trauma these will typically resolve on their own spontaneously and have no impact on the vision.    These are very similar to having a bruise in your   Past Surgical History:  Procedure Laterality Date   ABDOMINAL VASCULAR ULTRASOUND EVALUATION  12/20/2008   mild non-obstructive plaque, abdominal aorta, with mild calcification at the bifurcation-no obstruction, no evidence for aneurysm of the abdominal aorta   APPENDECTOMY     CARDIAC CATHETERIZATION  01/04/1999   hyperdynamic LV function, probable angiographic mitral valve prolapse, systolic muscle bridging of the mid LAD without obstructive disease, systolic narrowing up to 70%   Cardiopulmonary MET-test  07/20/2012   reduced exercise effort and functional status, peak max O2 comsumption was only 58% of predicted, cardiovascular status was interpreted as indeterminate for myocardial dysfunction due to the suboptimal peak cardiovascular stress load.   EYE SURGERY Right    Detached Retina   HERNIA REPAIR     Incisional hernia repair (early 60s)   INGUINAL HERNIA REPAIR Right 04/17/2021   Procedure: RIGHT INGUINAL HERNIA REPAIR WITH MESH;  Surgeon: Manus Rudd, MD;  Location: [facility] OR;   Service: General;  Laterality: Right;   TONSILLECTOMY     UMBILICAL HERNIA REPAIR N/A 04/17/2021   Procedure: UMBILICAL HERNIA REPAIR;  Surgeon: Manus Rudd, MD;  Location: [facility] OR;  Service: General;  Laterality: N/A;  LMA & TAP BLOCK   Social History:  reports that he has quit smoking. He has never used smokeless tobacco. He reports that he does not drink alcohol and does not use drugs.  Allergies  Allergen Reactions   Ciprofloxacin Other (See Comments)    Cannot take due to currently taking Amiodarone   Hytrin [Terazosin] Other (See Comments)    Syncopal event    Antihistamines, Chlorpheniramine-Type Other (See Comments)   Doxycycline Other (See Comments)   Pseudoephedrine Other (See Comments)    Family History  Problem Relation Age of Onset   Stroke Mother    Stroke Paternal Grandfather      Prior to Admission medications   Medication Sig Start Date End Date Taking? Authorizing Provider  acetaminophen (TYLENOL) 325 MG tablet Take 650 mg by mouth every 6 (six) hours as needed for moderate pain. Patient not taking: Reported on 07/22/2023    [provider]  acetaminophen (TYLENOL) 650 MG CR tablet Take 650 mg by mouth at bedtime as needed for pain.    [provider]  alfuzosin (UROXATRAL) 10 MG 24 hr tablet Take 10 mg by mouth daily.    [provider]  amiodarone (PACERONE) 200 MG tablet Take 1 tablet (200 mg total) by mouth daily. 01/14/23   Jodelle Gross, NP  amoxicillin (AMOXIL) 500 MG capsule Take 2 capsules (1,000 mg total) by mouth 2 (two) times daily. 04/06/22   Dione Booze, MD  ascorbic acid (VITAMIN C) 1000 MG tablet Take 1,000 mg by mouth daily.    [provider]  benzonatate (TESSALON) 100 MG capsule Take 1 capsule (100 mg total) by mouth every 8 (eight) hours. 04/06/22   Dione Booze, MD  chlorthalidone (HYGROTON) 25 MG tablet Take 0.5 tablets (12.5 mg total) by mouth daily. 01/14/23   Jodelle Gross, NP  Cholecalciferol  (VITAMIN D) 50 MCG (2000 UT) tablet Take 2,000 Units by mouth daily.    [provider]  docusate sodium (COLACE) 100 MG capsule Take 100 mg by mouth 2 (two) times daily.    [provider]  finasteride (PROSCAR) 5 MG tablet Take 5 mg by mouth daily.    [provider]  hydrocortisone 2.5 % cream Apply 1 application topically as needed (itching). 08/12/19   [provider]  ketoconazole (NIZORAL) 2 % cream Apply 1 application topically daily as needed for irritation.    [provider]  levothyroxine (SYNTHROID) 75 MCG tablet Take 75 mcg by mouth daily before breakfast. 04/22/14   [provider]  linaclotide (LINZESS) 145 MCG CAPS capsule Take 1 capsule (145 mcg total) by mouth as needed. 06/23/20   Zehr, Princella Pellegrini, PA-C  loratadine (CLARITIN) 10 MG tablet Take 10 mg by mouth daily.  [provider]  losartan (COZAAR) 25 MG tablet Take 1 tablet (25 mg total) by mouth daily. 01/14/23   Jodelle Gross, NP  loteprednol (LOTEMAX) 0.5 % ophthalmic suspension Place 1 drop into the right eye daily. 10/05/20 10/05/21  Rankin, Alford Highland, MD  melatonin 3 MG TABS tablet Take 3 mg by mouth at bedtime.    [provider]  metoprolol tartrate (LOPRESSOR) 25 MG tablet Take 0.5 tablets (12.5 mg total) by mouth 2 (two) times daily as needed (afib). 02/14/21   Lennette Bihari, MD  Multiple Vitamins-Minerals (PRESERVISION AREDS) CAPS Take 1 capsule by mouth 2 (two) times daily. Patient not taking: Reported on 07/22/2023    [provider]  Polyethyl Glycol-Propyl Glycol 0.4-0.3 % SOLN Place 2 drops into both eyes 2 (two) times daily as needed (for dry eyes).    [provider]  polyethylene glycol powder (MIRALAX) 17 GM/SCOOP powder See admin instructions.    [provider]  potassium chloride SA (KLOR-CON M) 20 MEQ tablet Take 1 tablet (20 mEq total) by mouth 2 (two) times daily. 04/06/22   Dione Booze, MD  Psyllium  (METAMUCIL FIBER SINGLES PO)     [provider]  Rivaroxaban (XARELTO) 15 MG TABS tablet Take 1 tablet (15 mg total) by mouth daily with supper. 01/14/23   Jodelle Gross, NP  rosuvastatin (CRESTOR) 5 MG tablet TAKE 1 TABLET BY MOUTH DAILY AT 6 PM. Please call office to schedule an appt for further refills. Thank you 01/14/23   Jodelle Gross, NP  simethicone (MYLICON) 125 MG chewable tablet Chew 125 mg by mouth every 6 (six) hours as needed for flatulence.    [provider]  sodium chloride (OCEAN) 0.65 % SOLN nasal spray Place 1 spray into both nostrils as needed for congestion.    [provider]  traMADol (ULTRAM) 50 MG tablet Take 1 tablet (50 mg total) by mouth every 6 (six) hours as needed for moderate pain. 04/18/21   Manus Rudd, MD  vitamin B-12 (CYANOCOBALAMIN) 1000 MCG tablet Take 1,000 mcg by mouth daily.    [provider]    Physical Exam: BP (!) 146/66   Pulse 63   Temp 97.7 F (36.5 C) (Oral) Comment: Simultaneous filing. User may not have seen previous data. Comment (Src): Simultaneous filing. User may not have seen previous data.  Resp 16   SpO2 97%  General:  Alert, oriented, calm, in no acute distress, looks weak and slightly dry, his wife is at the bedside. Cardiovascular: RRR, no murmurs or rubs, no peripheral edema  Respiratory: clear to auscultation bilaterally, no wheezes, no crackles  Abdomen: soft, nontender, nondistended, normal bowel tones heard  Skin: dry, no rashes  GU: Foley catheter in place with clear yellow urine Musculoskeletal: no joint effusions, normal range of motion  Psychiatric: appropriate affect, normal speech  Neurologic: extraocular muscles intact, clear speech, moving all extremities with intact sensorium         Labs on Admission:  Basic Metabolic Panel: Recent Labs  Lab 09/12/23 1202  NA 137  K 3.9  CL 100  CO2 29  GLUCOSE 144*  BUN 30*  CREATININE 1.49*  CALCIUM 9.4   Liver  Function Tests: Recent Labs  Lab 09/12/23 1202  AST 19  ALT 13  ALKPHOS 139*  BILITOT 0.7  PROT 6.9  ALBUMIN 3.6   No results for input(s): "LIPASE", "AMYLASE" in the last 168 hours. No results for input(s): "AMMONIA" in the  last 168 hours. CBC: Recent Labs  Lab 09/12/23 1202  WBC 8.9  NEUTROABS 7.1  HGB 12.2*  HCT 37.3*  MCV 105.4*  PLT 189   Cardiac Enzymes: No results for input(s): "CKTOTAL", "CKMB", "CKMBINDEX", "TROPONINI" in the last 168 hours. BNP (last 3 results) No results for input(s): "BNP" in the last 8760 hours.  ProBNP (last 3 results) No results for input(s): "PROBNP" in the last 8760 hours.  CBG: No results for input(s): "GLUCAP" in the last 168 hours.  Radiological Exams on Admission: DG Chest 2 View Result Date: 09/12/2023 CLINICAL DATA:  Weakness. EXAM: CHEST - 2 VIEW COMPARISON:  X-ray 02/21/2023 and older FINDINGS: Stable cardiopericardial silhouette. No consolidation, pneumothorax or effusion. No edema. Chronic appearing interstitial lung changes identified. Degenerative changes of the spine. Overlapping cardiac leads. IMPRESSION: Chronic changes.  No acute cardiopulmonary disease. Electronically Signed   By: Karen Kays M.D.   On: 09/12/2023 14:07   Assessment/Plan Tanner Ho is a 83 y.o. male with medical history significant for CKD stage III, BPH, paroxysmal atrial fibrillation on Xarelto admitted to the hospital with weakness and urinary retention.  Weakness-unclear if this is due to a possible UTI, though he had does not have any leukocytosis, no fever.  Could be due to his urinary retention. -Observation admission -Fall precautions -PT/OT consult -TOC consulted for possible home health and DME needs  Paroxysmal atrial fibrillation-continue Xarelto  BPH-Proscar, continue Foley catheter.  Would likely plan to leave this in until outpatient urology follow-up  Hyperlipidemia-Crestor    Code Status: Full Code  Consults called:  None  Admission status: Observation  Time spent: 48 minutes  Ronn Smolinsky Sharlette Dense MD [facility] Pager [phone]  If 7PM-7AM, please contact night-coverage [email or web address] Password [email or web address]  09/12/2023, 3:46 PM

## 2023-09-12 NOTE — ED Provider Notes (Signed)
 [place] EMERGENCY DEPARTMENT AT [facility] Provider Note   CSN: [id number] Arrival date & time: 09/12/23  1139     History  Chief Complaint  Patient presents with   Weakness    Tanner Ho is a 83 y.o. male.  83 year old male with prior medical history as detailed below presents for evaluation.  Patient is accompanied by his wife.  The wife provides majority of history.  Patient with increasing weakness x 2 to 3 days.  Patient with apparent need for intermittent catheterizations for the last week.  His last catheterization was on Wednesday.  More than 1 L of urine was obtained at that time.  No additional urinary catheterizations performed since Wednesday.  Patient with decreased urine output.  Patient's wife reports some " leakage" from the patient's penis overnight.  No fever reported.  The history is provided by the patient.       Home Medications Prior to Admission medications   Medication Sig Start Date End Date Taking? Authorizing Provider  acetaminophen (TYLENOL) 325 MG tablet Take 650 mg by mouth every 6 (six) hours as needed for moderate pain. Patient not taking: Reported on 07/22/2023    [provider]  acetaminophen (TYLENOL) 650 MG CR tablet Take 650 mg by mouth at bedtime as needed for pain.    [provider]  alfuzosin (UROXATRAL) 10 MG 24 hr tablet Take 10 mg by mouth daily.    [provider]  amiodarone (PACERONE) 200 MG tablet Take 1 tablet (200 mg total) by mouth daily. 01/14/23   Jodelle Gross, NP  amoxicillin (AMOXIL) 500 MG capsule Take 2 capsules (1,000 mg total) by mouth 2 (two) times daily. 04/06/22   Dione Booze, MD  ascorbic acid (VITAMIN C) 1000 MG tablet Take 1,000 mg by mouth daily.    [provider]  benzonatate (TESSALON) 100 MG capsule Take 1 capsule (100 mg total) by mouth every 8 (eight) hours. 04/06/22   Dione Booze, MD  chlorthalidone (HYGROTON) 25 MG tablet Take 0.5 tablets (12.5  mg total) by mouth daily. 01/14/23   Jodelle Gross, NP  Cholecalciferol (VITAMIN D) 50 MCG (2000 UT) tablet Take 2,000 Units by mouth daily.    [provider]  docusate sodium (COLACE) 100 MG capsule Take 100 mg by mouth 2 (two) times daily.    [provider]  finasteride (PROSCAR) 5 MG tablet Take 5 mg by mouth daily.    [provider]  hydrocortisone 2.5 % cream Apply 1 application topically as needed (itching). 08/12/19   [provider]  ketoconazole (NIZORAL) 2 % cream Apply 1 application topically daily as needed for irritation.    [provider]  levothyroxine (SYNTHROID) 75 MCG tablet Take 75 mcg by mouth daily before breakfast. 04/22/14   [provider]  linaclotide (LINZESS) 145 MCG CAPS capsule Take 1 capsule (145 mcg total) by mouth as needed. 06/23/20   Zehr, Princella Pellegrini, PA-C  loratadine (CLARITIN) 10 MG tablet Take 10 mg by mouth daily.    [provider]  losartan (COZAAR) 25 MG tablet Take 1 tablet (25 mg total) by mouth daily. 01/14/23   Jodelle Gross, NP  loteprednol (LOTEMAX) 0.5 % ophthalmic suspension Place 1 drop into the right eye daily. 10/05/20 10/05/21  Rankin, Alford Highland, MD  melatonin 3 MG TABS tablet Take 3 mg by mouth at bedtime.    [provider]  metoprolol tartrate (LOPRESSOR) 25 MG tablet Take 0.5 tablets (  12.5 mg total) by mouth 2 (two) times daily as needed (afib). 02/14/21   Lennette Bihari, MD  Multiple Vitamins-Minerals (PRESERVISION AREDS) CAPS Take 1 capsule by mouth 2 (two) times daily. Patient not taking: Reported on 07/22/2023    [provider]  Polyethyl Glycol-Propyl Glycol 0.4-0.3 % SOLN Place 2 drops into both eyes 2 (two) times daily as needed (for dry eyes).    [provider]  polyethylene glycol powder (MIRALAX) 17 GM/SCOOP powder See admin instructions.    [provider]  potassium chloride SA (KLOR-CON M) 20 MEQ tablet Take 1 tablet (20 mEq  total) by mouth 2 (two) times daily. 04/06/22   Dione Booze, MD  Psyllium (METAMUCIL FIBER SINGLES PO)     [provider]  Rivaroxaban (XARELTO) 15 MG TABS tablet Take 1 tablet (15 mg total) by mouth daily with supper. 01/14/23   Jodelle Gross, NP  rosuvastatin (CRESTOR) 5 MG tablet TAKE 1 TABLET BY MOUTH DAILY AT 6 PM. Please call office to schedule an appt for further refills. Thank you 01/14/23   Jodelle Gross, NP  simethicone (MYLICON) 125 MG chewable tablet Chew 125 mg by mouth every 6 (six) hours as needed for flatulence.    [provider]  sodium chloride (OCEAN) 0.65 % SOLN nasal spray Place 1 spray into both nostrils as needed for congestion.    [provider]  traMADol (ULTRAM) 50 MG tablet Take 1 tablet (50 mg total) by mouth every 6 (six) hours as needed for moderate pain. 04/18/21   Manus Rudd, MD  vitamin B-12 (CYANOCOBALAMIN) 1000 MCG tablet Take 1,000 mcg by mouth daily.    [provider]      Allergies    Ciprofloxacin; Hytrin [terazosin]; Antihistamines, chlorpheniramine-type; Doxycycline; and Pseudoephedrine    Review of Systems   Review of Systems  Unable to perform ROS: Other    Physical Exam Updated Vital Signs BP (!) 142/81 (BP Location: Right Arm) Comment: Simultaneous filing. User may not have seen previous data. Comment (BP Location): Simultaneous filing. User may not have seen previous data.  Pulse 73 Comment: Simultaneous filing. User may not have seen previous data.  Temp 97.7 F (36.5 C) (Oral) Comment: Simultaneous filing. User may not have seen previous data. Comment (Src): Simultaneous filing. User may not have seen previous data.  Resp (!) 28 Comment: Simultaneous filing. User may not have seen previous data.  SpO2 100% Comment: Simultaneous filing. User may not have seen previous data. Physical Exam Vitals and nursing note reviewed.  Constitutional:      General: He is not in acute distress.     Appearance: Normal appearance. He is well-developed.  HENT:     Head: Normocephalic and atraumatic.  Eyes:     Conjunctiva/sclera: Conjunctivae normal.     Pupils: Pupils are equal, round, and reactive to light.  Cardiovascular:     Rate and Rhythm: Normal rate and regular rhythm.     Heart sounds: Normal heart sounds.  Pulmonary:     Effort: Pulmonary effort is normal. No respiratory distress.     Breath sounds: Normal breath sounds.  Abdominal:     General: There is no distension.     Palpations: Abdomen is soft.     Tenderness: There is no abdominal tenderness.  Genitourinary:    Comments: Distended bladder is palpated on exam. Musculoskeletal:        General: No deformity. Normal range of motion.     Cervical back:  Normal range of motion and neck supple.  Skin:    General: Skin is warm and dry.  Neurological:     General: No focal deficit present.     Mental Status: He is alert and oriented to person, place, and time.     ED Results / Procedures / Treatments   Labs (all labs ordered are listed, but only abnormal results are displayed) Labs Reviewed  COMPREHENSIVE METABOLIC PANEL WITH GFR - Abnormal; Notable for the following components:      Result Value   Glucose, Bld 144 (*)    BUN 30 (*)    Creatinine, Ser 1.49 (*)    Alkaline Phosphatase 139 (*)    GFR, Estimated 46 (*)    All other components within normal limits  CBC WITH DIFFERENTIAL/PLATELET - Abnormal; Notable for the following components:   RBC 3.54 (*)    Hemoglobin 12.2 (*)    HCT 37.3 (*)    MCV 105.4 (*)    MCH 34.5 (*)    All other components within normal limits  I-STAT CG4 LACTIC ACID, ED - Abnormal; Notable for the following components:   Lactic Acid, Venous 2.1 (*)    All other components within normal limits  URINALYSIS, W/ REFLEX TO CULTURE (INFECTION SUSPECTED)  TROPONIN I (HIGH SENSITIVITY)    EKG EKG Interpretation Date/Time:  Friday September 12 2023 11:51:34 EDT Ventricular Rate:   70 PR Interval:    QRS Duration:  100 QT Interval:  450 QTC Calculation: 486 R Axis:   -29  Text Interpretation: Atrial fibrillation Borderline left axis deviation Nonspecific T abnormalities, lateral leads Borderline prolonged QT interval Confirmed by Kristine Royal [phone]) on 09/12/2023 12:39:18 PM  Radiology No results found.  Procedures Procedures    Medications Ordered in ED Medications  sodium chloride 0.9 % bolus 1,000 mL (has no administration in time range)    ED Course/ Medical Decision Making/ A&P                                 Medical Decision Making Amount and/or Complexity of Data Reviewed Labs: ordered. Radiology: ordered.  Risk Prescription drug management. Decision regarding hospitalization.    Medical Screen Complete  This patient presented to the ED with complaint of weakness, urinary retention.  This complaint involves an extensive number of treatment options. The initial differential diagnosis includes, but is not limited to, urinary retention, UTI, AKI, metabolic abnormality, etc.  This presentation is: Acute, Chronic, Self-Limited, Previously Undiagnosed, Uncertain Prognosis, Complicated, Systemic Symptoms, and Threat to Life/Bodily Function  Patient presents with his wife from home.  Patient with history of urinary retention.  Now with increasing weakness and minimal urine output over the last 24 to 48 hours.  Foley catheter placed.  Greater than 1 L of urine produced.  Urine itself is cloudy and purulent in nature.  UA is suggestive of UTI.  Patient would benefit from admission for further workup and treatment.  Hospitalist service made aware of case.  Of note, patient's daughter is an Armed forces technical officer.  She was made aware of case progression and plan of care.  She is requesting involvement with any and all discharge planning.  She would prefer to have her father to age in place at his home and  she would very much appreciate assistance with  that.  Co morbidities that complicated the patient's evaluation  See HPI   Additional history obtained:  Additional  history obtained from [facility] External records from outside sources obtained and reviewed including prior ED visits and prior Inpatient records.   Problem List / ED Course:  UTI, Urinary retention, Weakness     Disposition:  After consideration of the diagnostic results and the patients response to treatment, I feel that the patent would benefit from admission.          Final Clinical Impression(s) / ED Diagnoses Final diagnoses:  Weakness  Urinary tract infection without hematuria, site unspecified  Urinary retention    Rx / DC Orders ED Discharge Orders     None         Wynetta Fines, MD 09/12/23 1442

## 2023-09-12 NOTE — Plan of Care (Signed)
  Problem: Coping: Goal: Level of anxiety will decrease Outcome: Progressing   Problem: Elimination: Goal: Will not experience complications related to bowel motility Outcome: Progressing   Problem: Pain Managment: Goal: General experience of comfort will improve and/or be controlled Outcome: Progressing

## 2023-09-13 DIAGNOSIS — N139 Obstructive and reflux uropathy, unspecified: Secondary | ICD-10-CM

## 2023-09-13 DIAGNOSIS — R531 Weakness: Secondary | ICD-10-CM

## 2023-09-13 DIAGNOSIS — R338 Other retention of urine: Secondary | ICD-10-CM | POA: Diagnosis not present

## 2023-09-13 DIAGNOSIS — N3001 Acute cystitis with hematuria: Secondary | ICD-10-CM

## 2023-09-13 DIAGNOSIS — N390 Urinary tract infection, site not specified: Secondary | ICD-10-CM | POA: Diagnosis not present

## 2023-09-13 LAB — CBC
HCT: 33.2 % — ABNORMAL LOW (ref 39.0–52.0)
Hemoglobin: 11.3 g/dL — ABNORMAL LOW (ref 13.0–17.0)
MCH: 36.6 pg — ABNORMAL HIGH (ref 26.0–34.0)
MCHC: 34 g/dL (ref 30.0–36.0)
MCV: 107.4 fL — ABNORMAL HIGH (ref 80.0–100.0)
Platelets: 168 10*3/uL (ref 150–400)
RBC: 3.09 MIL/uL — ABNORMAL LOW (ref 4.22–5.81)
RDW: 13.1 % (ref 11.5–15.5)
WBC: 8.3 10*3/uL (ref 4.0–10.5)
nRBC: 0 % (ref 0.0–0.2)

## 2023-09-13 LAB — BASIC METABOLIC PANEL WITH GFR
Anion gap: 5 (ref 5–15)
BUN: 23 mg/dL (ref 8–23)
CO2: 29 mmol/L (ref 22–32)
Calcium: 9 mg/dL (ref 8.9–10.3)
Chloride: 103 mmol/L (ref 98–111)
Creatinine, Ser: 1.29 mg/dL — ABNORMAL HIGH (ref 0.61–1.24)
GFR, Estimated: 55 mL/min — ABNORMAL LOW (ref >60)
Glucose, Bld: 94 mg/dL (ref 70–99)
Potassium: 3.9 mmol/L (ref 3.5–5.1)
Sodium: 137 mmol/L (ref 135–145)

## 2023-09-13 MED ORDER — VITAMIN B-12 1000 MCG PO TABS
1000.0000 ug | ORAL_TABLET | Freq: Every day | ORAL | Status: DC
  Administered 2023-09-13 – 2023-09-14 (×2): 1000 ug via ORAL
  Filled 2023-09-13 (×2): qty 1

## 2023-09-13 MED ORDER — FINASTERIDE 5 MG PO TABS
5.0000 mg | ORAL_TABLET | Freq: Every day | ORAL | Status: DC
  Administered 2023-09-13 – 2023-09-14 (×2): 5 mg via ORAL
  Filled 2023-09-13 (×2): qty 1

## 2023-09-13 MED ORDER — CHLORHEXIDINE GLUCONATE CLOTH 2 % EX PADS
6.0000 | MEDICATED_PAD | Freq: Every day | CUTANEOUS | Status: DC
  Administered 2023-09-13 – 2023-09-14 (×2): 6 via TOPICAL

## 2023-09-13 MED ORDER — ROSUVASTATIN CALCIUM 5 MG PO TABS
5.0000 mg | ORAL_TABLET | Freq: Every day | ORAL | Status: DC
  Administered 2023-09-13: 5 mg via ORAL
  Filled 2023-09-13: qty 1

## 2023-09-13 MED ORDER — METOPROLOL TARTRATE 12.5 MG HALF TABLET: 12.5000 mg | ORAL_TABLET | Freq: Two times a day (BID) | ORAL | Status: DC | PRN

## 2023-09-13 MED ORDER — LEVOTHYROXINE SODIUM 75 MCG PO TABS
75.0000 ug | ORAL_TABLET | Freq: Every day | ORAL | Status: DC
  Administered 2023-09-14: 75 ug via ORAL
  Filled 2023-09-13: qty 1

## 2023-09-13 MED ORDER — LINACLOTIDE 145 MCG PO CAPS: 145.0000 ug | ORAL_CAPSULE | Freq: Every day | ORAL | Status: DC | PRN

## 2023-09-13 MED ORDER — VITAMIN C 500 MG PO TABS
1000.0000 mg | ORAL_TABLET | Freq: Every day | ORAL | Status: DC
  Administered 2023-09-13 – 2023-09-14 (×2): 1000 mg via ORAL
  Filled 2023-09-13 (×2): qty 2

## 2023-09-13 MED ORDER — AMIODARONE HCL 200 MG PO TABS
200.0000 mg | ORAL_TABLET | Freq: Every day | ORAL | Status: DC
  Administered 2023-09-13 – 2023-09-14 (×2): 200 mg via ORAL
  Filled 2023-09-13 (×2): qty 1

## 2023-09-13 MED ORDER — DOCUSATE SODIUM 100 MG PO CAPS
100.0000 mg | ORAL_CAPSULE | Freq: Two times a day (BID) | ORAL | Status: DC
  Administered 2023-09-13 – 2023-09-14 (×3): 100 mg via ORAL
  Filled 2023-09-13 (×3): qty 1

## 2023-09-13 MED ORDER — LOSARTAN POTASSIUM 25 MG PO TABS
25.0000 mg | ORAL_TABLET | Freq: Every day | ORAL | Status: DC
  Administered 2023-09-13 – 2023-09-14 (×2): 25 mg via ORAL
  Filled 2023-09-13 (×2): qty 1

## 2023-09-13 MED ORDER — ROSUVASTATIN CALCIUM 5 MG PO TABS
5.0000 mg | ORAL_TABLET | Freq: Every evening | ORAL | Status: DC
  Administered 2023-09-14: 5 mg via ORAL
  Filled 2023-09-13: qty 1

## 2023-09-13 MED ORDER — RIVAROXABAN 15 MG PO TABS
15.0000 mg | ORAL_TABLET | Freq: Every day | ORAL | Status: DC
  Administered 2023-09-13 – 2023-09-14 (×2): 15 mg via ORAL
  Filled 2023-09-13 (×3): qty 1

## 2023-09-13 MED ORDER — CHLORTHALIDONE 25 MG PO TABS
12.5000 mg | ORAL_TABLET | ORAL | Status: DC
  Administered 2023-09-13: 12.5 mg via ORAL
  Filled 2023-09-13: qty 0.5

## 2023-09-13 MED ORDER — FINASTERIDE 5 MG PO TABS: 5.0000 mg | ORAL_TABLET | Freq: Every day | ORAL | Status: DC

## 2023-09-13 MED ORDER — ALFUZOSIN HCL ER 10 MG PO TB24
10.0000 mg | ORAL_TABLET | Freq: Every day | ORAL | Status: DC
Start: 2023-09-13 — End: 2023-09-15
  Administered 2023-09-13: 10 mg via ORAL
  Filled 2023-09-13 (×2): qty 1

## 2023-09-13 NOTE — Evaluation (Signed)
 Physical Therapy Evaluation Patient Details Name: Tanner Ho MRN: [id number] DOB: [birth year] Today's Date: 09/13/2023  History of Present Illness  Patient is a 83 year old male who presented with weakness and urinary retention.  Patient was admitted with possible UTI.  PMH: BPH, hyperlipidemia, paroxysmal a fib,  Clinical Impression  Pt admitted with above diagnosis.  Pt currently with functional limitations due to the deficits listed below (see PT Problem List). Pt will benefit from acute skilled PT to increase their independence and safety with mobility to allow discharge.  Pt very pleasant and motivated to participate.  Pt limited by weakness and presents with neuro deficits while mobilizing as well as described by daughter with gait pattern prior to admission.  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  Daughter reports pt's PCP has been not helpful in regards to his gradual decline and her reported concerns.  Daughter would prefer pt return home so would recommend HHPT with neuro competent PT for best benefit as pt would have difficulty getting to OPPT.  If family feels unable to assist pt, Patient will benefit from continued inpatient follow up therapy, <3 hours/day.  Highly encouraged f/u with Neuro MD due to movement deficits and daughter's reported concerns (she is hoping for a formal diagnosis to improve his outcomes and future care).  She would prefer a neuro consult while pt is in hospital however aware this will likely be f/u as outpatient due to non-acute nature.          If plan is discharge home, recommend the following: Two people to help with walking and/or transfers;A lot of help with bathing/dressing/bathroom;Assistance with cooking/housework;Assist for transportation;Help with stairs or ramp for entrance;Supervision due to cognitive status   Can travel by private vehicle         Equipment Recommendations Hospital bed  Recommendations for Other Services       Functional Status Assessment Patient has had a recent decline in their functional status and demonstrates the ability to make significant improvements in function in a reasonable and predictable amount of time.     Precautions / Restrictions Precautions Precautions: Fall Restrictions Weight Bearing Restrictions Per Provider Order: No      Mobility  Bed Mobility Overal bed mobility: Needs Assistance Bed Mobility: Supine to Sit, Sit to Supine     Supine to sit: Max assist, HOB elevated, +2 for physical assistance Sit to supine: Max assist, +2 for physical assistance   General bed mobility comments: with increased time and cues for positioning.  assist for upper and lower body, pt even reversing legs and reminded him goal was to sit EOB    Transfers Overall transfer level: Needs assistance Equipment used: Rolling walker (2 wheels) Transfers: Sit to/from Stand Sit to Stand: Mod assist, +2 physical assistance           General transfer comment: verbal cues for UE and LE positioning; performed x3; pt unable to march in place or shift weight to take side step    Ambulation/Gait               General Gait Details: unable today; daughter reports forward trunk flexed posture, shuffling gait, knees in flexion prior to admission  Stairs            Wheelchair Mobility     Tilt Bed    Modified Rankin (Stroke Patients Only)       Balance Overall balance  assessment: Needs assistance Sitting-balance support: Feet supported, Bilateral upper extremity supported Sitting balance-Leahy Scale: Poor   Postural control: Right lateral lean, Other (comment) (anterior) Standing balance support: Bilateral upper extremity supported, Reliant on assistive device for balance, During functional activity Standing balance-Leahy Scale: Zero                               Pertinent  Vitals/Pain Pain Assessment Pain Assessment: Faces Faces Pain Scale: Hurts little more Pain Location: no specific location Pain Descriptors / Indicators: Grimacing Pain Intervention(s): Repositioned, Monitored during session    Home Living Family/patient expects to be discharged to:: Private residence Living Arrangements: Spouse/significant other Available Help at Discharge: Family;Available 24 hours/day Type of Home: House Home Access: Ramped entrance       Home Layout: One level Home Equipment: Agricultural consultant (2 wheels);BSC/3in1;Wheelchair - manual      Prior Function Prior Level of Function : Needs assist       Physical Assist : ADLs (physical)   ADLs (physical): Bathing;Dressing Mobility Comments: reported using walker to get around in house. reported having two falls at home.  also uses w/c which as been a lot more recently ADLs Comments: wife assists as needed at home. reported needed more help in last few weeks.     Extremity/Trunk Assessment        Lower Extremity Assessment Lower Extremity Assessment: Generalized weakness (grossly 2+/5 strength throughout, unable to perform full knee extension bilaterally (contracture/rigidity??))    Cervical / Trunk Assessment Cervical / Trunk Assessment: Kyphotic;Other exceptions Cervical / Trunk Exceptions: kyphotic and forward head posture; daughter reports this has been persistent since his eye surgery where he had too keep head down and leaning forward for 3 days  Communication   Communication Communication: Impaired Factors Affecting Communication: Hearing impaired;Other (comment) (macular degeneration, visual impairments)    Cognition Arousal: Alert Behavior During Therapy: Flat affect   PT - Cognitive impairments: Memory                       PT - Cognition Comments: difficulty following conversation observed Following commands: Intact       Cueing Cueing Techniques: Verbal cues, Tactile cues      General Comments      Exercises     Assessment/Plan    PT Assessment Patient needs continued PT services  PT Problem List Decreased strength;Decreased coordination;Decreased cognition;Decreased activity tolerance;Decreased balance;Decreased mobility       PT Treatment Interventions Gait training;DME instruction;Balance training;Neuromuscular re-education;Functional mobility training;Therapeutic activities;Therapeutic exercise;Patient/family education;Wheelchair mobility training    PT Goals (Current goals can be found in the Care Plan section)  Acute Rehab PT Goals PT Goal Formulation: With patient/family Time For Goal Achievement: 09/27/23 Potential to Achieve Goals: Good    Frequency Min 3X/week     Co-evaluation               AM-PAC PT "6 Clicks" Mobility  Outcome Measure Help needed turning from your back to your side while in a flat bed without using bedrails?: A Lot Help needed moving from lying on your back to sitting on the side of a flat bed without using bedrails?: Total Help needed moving to and from a bed to a chair (including a wheelchair)?: Total Help needed standing up from a chair using your arms (e.g., wheelchair or bedside chair)?: Total Help needed to walk in hospital room?: Total Help needed climbing 3-5 steps  with a railing? : Total 6 Click Score: 7    End of Session Equipment Utilized During Treatment: Gait belt Activity Tolerance: Patient tolerated treatment well Patient left: in bed;with call bell/phone within reach;with bed alarm set;with family/visitor present   PT Visit Diagnosis: Other abnormalities of gait and mobility (R26.89);Other symptoms and signs involving the nervous system (R29.898)    Time: 1610-9604 PT Time Calculation (min) (ACUTE ONLY): 31 min   Charges:   PT Evaluation $PT Eval Moderate Complexity: 1 Mod PT Treatments $Therapeutic Activity: 8-22 mins PT General Charges $$ ACUTE PT VISIT: 1 Visit        Henretta Lodge  PT, DPT Physical Therapist Acute Rehabilitation Services Office: [phone]   Myna Asal Payson 09/13/2023, 1:39 PM

## 2023-09-13 NOTE — Evaluation (Signed)
 Occupational Therapy Evaluation Patient Details Name: Tanner Ho MRN: [id number] DOB: [birth year] Today's Date: 09/13/2023   History of Present Illness   Patient is a 83 year old male who presented with weakness and urinary retention.patient was admitted with possible UTI.  PMH: BPH, hyperlipidemia, paroxysmal a fib,     Clinical Impressions Patient is a 83 year old male who was admitted for above. Patient lives at home with wife and family support PRN. Patient reported being able to complete toileting tasks at rollator level prior to admission. Currently, patient is max A for getting to EOB with lateral lean to R side with pain in R hip. Patient unable to transition safely into standing with +1 A on this date. Patients daughter spoke with on phone per family request during session. Patient was noted to have decreased functional activity tolernace, decreased ROM, decreased BUE strength, decreased endurance, decreased sitting balance, decreased standing balanced, decreased safety awareness, and decreased knowledge of AE/AD impacting participation in ADLs. Patient would continue to benefit from skilled OT services at this time while admitted and after d/c to address noted deficits in order to improve overall safety and independence in ADLs. Patient will benefit from continued inpatient follow up therapy, <3 hours/day.      If plan is discharge home, recommend the following:   Two people to help with walking and/or transfers;Assistance with cooking/housework;Direct supervision/assist for medications management;Assist for transportation;Help with stairs or ramp for entrance;Direct supervision/assist for financial management;Two people to help with bathing/dressing/bathroom;Assistance with feeding     Functional Status Assessment   Patient has had a recent decline in their functional status and/or demonstrates limited ability to make significant improvements in function in a reasonable and  predictable amount of time     Equipment Recommendations   Hospital bed      Precautions/Restrictions   Precautions Precautions: Fall Restrictions Weight Bearing Restrictions Per Provider Order: No     Mobility Bed Mobility Overal bed mobility: Needs Assistance Bed Mobility: Supine to Sit, Sit to Supine     Supine to sit: Max assist, HOB elevated Sit to supine: Max assist   General bed mobility comments: with increased time and cues for positioning.       Balance Overall balance assessment: Needs assistance Sitting-balance support: Feet supported Sitting balance-Leahy Scale: Poor   Postural control: Right lateral lean, Other (comment) (anterior leaning)   Standing balance-Leahy Scale: Zero Standing balance comment: unable         ADL either performed or assessed with clinical judgement   ADL Overall ADL's : Needs assistance/impaired Eating/Feeding: Supervision/ safety;Set up;Bed level   Grooming: Bed level;Maximal assistance   Upper Body Bathing: Bed level;Maximal assistance   Lower Body Bathing: Bed level;Total assistance   Upper Body Dressing : Bed level;Maximal assistance   Lower Body Dressing: Bed level;Total assistance     Toilet Transfer Details (indicate cue type and reason): attempted stand at EOB with flexion of knees with inability to stand upright. lateral lean to R side with sitting with kyphotic posture. patient reported pain in R hip. Toileting- Clothing Manipulation and Hygiene: Bed level;Total assistance               Vision Baseline Vision/History: 2 Legally blind Additional Comments: patient has had no vision/low vision for a long time per wife.            Pertinent Vitals/Pain Pain Assessment Pain Assessment: Faces Faces Pain Scale: Hurts little more Pain Location: R hip Pain Descriptors / Indicators:  Grimacing (with movement) Pain Intervention(s): Limited activity within patient's tolerance, Monitored during session      Extremity/Trunk Assessment Upper Extremity Assessment Upper Extremity Assessment: Generalized weakness       Cervical / Trunk Assessment Cervical / Trunk Assessment: Kyphotic;Other exceptions Cervical / Trunk Exceptions: lateral positioning to R side of neck. leans forwards and to R side with sititng EOB      Cognition Arousal: Alert Behavior During Therapy: Flat affect Cognition: No apparent impairments             OT - Cognition Comments: wife was present during session                 Following commands: Intact                  Home Living Family/patient expects to be discharged to:: Private residence Living Arrangements: Spouse/significant other Available Help at Discharge: Family;Available 24 hours/day Type of Home: House Home Access: Ramped entrance     Home Layout: One level               Home Equipment: Agricultural consultant (2 wheels);BSC/3in1;Wheelchair - manual          Prior Functioning/Environment Prior Level of Function : Needs assist       Physical Assist : ADLs (physical)   ADLs (physical): Bathing;Dressing Mobility Comments: reported using walker to get around in house. reported having two falls at home. ADLs Comments: wife assists as needed at home. reported needed more help in last few weeks.    OT Problem List: Decreased strength;Impaired balance (sitting and/or standing);Decreased knowledge of precautions;Pain;Decreased safety awareness;Decreased activity tolerance;Decreased knowledge of use of DME or AE   OT Treatment/Interventions: Self-care/ADL training;DME and/or AE instruction;Therapeutic activities;Therapeutic exercise;Energy conservation;Patient/family education      OT Goals(Current goals can be found in the care plan section)   Acute Rehab OT Goals Patient Stated Goal: to go home with family OT Goal Formulation: With patient/family Time For Goal Achievement: 09/27/23 Potential to Achieve Goals: Fair    OT Frequency:  Min 2X/week       AM-PAC OT "6 Clicks" Daily Activity     Outcome Measure Help from another person eating meals?: A Little Help from another person taking care of personal grooming?: A Lot Help from another person toileting, which includes using toliet, bedpan, or urinal?: Total Help from another person bathing (including washing, rinsing, drying)?: Total Help from another person to put on and taking off regular upper body clothing?: Total Help from another person to put on and taking off regular lower body clothing?: Total 6 Click Score: 9   End of Session Equipment Utilized During Treatment: Rolling walker (2 wheels);Gait belt Nurse Communication: Other (comment) (pain)  Activity Tolerance: Patient limited by pain Patient left: in bed;with call bell/phone within reach;with family/visitor present  OT Visit Diagnosis: Unsteadiness on feet (R26.81);Other abnormalities of gait and mobility (R26.89);Pain Pain - Right/Left: Right Pain - part of body: Hip                Time: 0852-0928 OT Time Calculation (min): 36 min Charges:  OT General Charges $OT Visit: 1 Visit OT Evaluation $OT Eval Moderate Complexity: 1 Mod OT Treatments $Self Care/Home Management : 8-22 mins  Tanner Heckler, MS Acute Rehabilitation Department Office# [phone]   Jame Maze 09/13/2023, 9:40 AM

## 2023-09-13 NOTE — Progress Notes (Signed)
 PROGRESS NOTE    Tanner Ho  ZOX:[id number] DOB: [birth year] DOA: 09/12/2023 PCP: Imelda Man, MD    Brief Narrative:   Tanner STARNER is a 83 y.o. male with past medical history significant for CKD stage IIIb, BPH with urinary retention, paroxysmal atrial fibrillation on Xarelto, HTN who presented to [facility] ED on 09/12/2023 from home via EMS with weakness over the last 2 days.  Recently seen by urology outpatient, and instructed to start self-catheterization due to urinary retention.  Patient reports he did this on 1 occasion over the last 2 days in which she had some blood thereafter in his urine.  Reports of mild nausea, no vomiting or diarrhea.  Denies fevers/chills, no cough, no shortness of breath, no abdominal pain.  In the ED, temperature 97.7 F, HR 73, RR 28, BP 142/81, SpO2 100% on room air.  WBC 8.3, hemoglobin 11.3, platelet count 168.  Sodium 137, potassium 3.9, chloride 100, CO2 29, glucose 144, BUN 30, creatinine 1.49.  High sensitive troponin 23>20.  Urinalysis with large leukocytes, large hemoglobin, few bacteria, 21-50 WBCs.  Chest x-ray with chronic appearing interstitial lung changes, no acute cardiopulmonary disease process.  Urine culture obtained..  Received 1 L NS bolus in the ED.  TRH consulted for admission for further evaluation management of obstructive uropathy, concern for UTI, generalized weakness.  Assessment & Plan:   Urinary retention BPH Patient follows with urology outpatient, Dr. Derrick Fling.  Recently seen in clinic and instructed to self catheter of the eyes as needed at home.  Patient was able to perform x 1 but concern for bleeding postcatheterization likely complicated by history of Xarelto use. -- Foley catheter placed -- Finasteride 5 mg p.o. daily -- Alfuzosin 10 mg p.o. nightly -- Anticipate need to continue Foley catheter on discharge with outpatient follow-up with urology for voiding trial versus definitive management of  BPH  Urinary tract infection Urinalysis with large leukocytes, large hemoglobin, few bacteria, 20-50 WBCs.  Likely complicated by urinary retention as above. -- Urine culture: Pending -- Ceftriaxone 1 g IV q24h  CKD stage IIIb Baseline creatinine 1.6/1.7 over the past 12 months.  Creatinine on admission 1.49, stable. -- Cr 1.49>1.29 -- BMP in a.m.  Paroxysmal atrial fibrillation on anticoagulation Essential hypertension -- Amiodarone 2 mg p.o. daily -- Metoprolol tartrate 12.5 g p.o. twice daily -- Chlorthalidone 12.5 g p.o. every other day -- Losartan 25 mg p.o. daily -- Xarelto 50 mg p.o. daily  Hypothyroidism Recent TSH on 07/22/2023; 3.980.,  Within normal limits. -- Levothyroxine 75 mcg p.o. daily  Hyperlipidemia -- Crestor 5 mg p.o. daily  Vitamin B12 deficiency -- Cyanocobalamin 1000 mcg p.o. daily  Weakness/debility/deconditioning: Currently lives at home with spouse.  Currently utilizes a walker and wheelchair for mobility.  Spouse reports utilizing wheelchair more frequently now. --PT/OT consultation   DVT prophylaxis:  Rivaroxaban (XARELTO) tablet 15 mg    Code Status: Full Code Family Communication: Updated spouse present bedside this morning  Disposition Plan:  Level of care: Med-Surg Status is: Observation The patient remains OBS appropriate and will d/c before 2 midnights.    Consultants:  None  Procedures:  Foley catheter placement  Antimicrobials:  Ceftriaxone   Subjective: Patient seen examined bedside, lying in bed.  Spouse present.  No specific complaints this morning other than generalized weakness.  Foley catheter placed on admission, draining yellow urine with some small specks of blood noted.  Remains on IV antibiotics, awaiting urine culture results.  Spouse concerned  about his mobility with significant increased decline over the last few weeks.  Now utilizing a walker and wheelchair for mobility.  Discussed with patient and spouse, may  need rehab placement versus home health, they will discuss pending therapy evaluation.  Patient denies headache, no dizziness, no chest pain, no palpitation, no shortness of breath, no fever/chills/night sweats, no nausea/vomiting/diarrhea, no focal weakness, no fatigue, no paresthesia.  No acute events overnight per nursing staff.  Objective: Vitals:   09/12/23 2027 09/13/23 0023 09/13/23 0358 09/13/23 0611  BP: 121/65 (!) 148/76 (!) 161/63 (!) 148/62  Pulse: 65 64 (!) 58 64  Resp: 17 20 17 18   Temp: 98.8 F (37.1 C) 98.1 F (36.7 C) 98.9 F (37.2 C) 97.7 F (36.5 C)  TempSrc: Oral Oral Oral Oral  SpO2: 97% 94% 96% 96%  Weight:      Height:        Intake/Output Summary (Last 24 hours) at 09/13/2023 1002 Last data filed at 09/13/2023 0405 Gross per 24 hour  Intake 1032.23 ml  Output 2700 ml  Net -1667.77 ml   Filed Weights   09/12/23 1607  Weight: 69.1 kg    Examination:  Physical Exam: GEN: NAD, alert and oriented x 3, elderly/chronically ill appearance HEENT: NCAT, PERRL, EOMI, sclera clear, MMM PULM: CTAB w/o wheezes/crackles, normal respiratory effort, on room air CV: RRR w/o M/G/R GI: abd soft, NTND, + BS GU: Foley catheter noted with yellow urine in collection bag with a few small blood clots MSK: no peripheral edema, moves all extremities independently NEURO: No focal neurological deficit PSYCH: normal mood/affect Integumentary: No concerning rashes/lesions/wounds noted on exposed skin surfaces    Data Reviewed: I have personally reviewed following labs and imaging studies  CBC: Recent Labs  Lab 09/12/23 1202 09/13/23 0454  WBC 8.9 8.3  NEUTROABS 7.1  --   HGB 12.2* 11.3*  HCT 37.3* 33.2*  MCV 105.4* 107.4*  PLT 189 168   Basic Metabolic Panel: Recent Labs  Lab 09/12/23 1202 09/13/23 0454  NA 137 137  K 3.9 3.9  CL 100 103  CO2 29 29  GLUCOSE 144* 94  BUN 30* 23  CREATININE 1.49* 1.29*  CALCIUM 9.4 9.0   GFR: Estimated Creatinine  Clearance: 42.4 mL/min (A) (by C-G formula based on SCr of 1.29 mg/dL (H)). Liver Function Tests: Recent Labs  Lab 09/12/23 1202  AST 19  ALT 13  ALKPHOS 139*  BILITOT 0.7  PROT 6.9  ALBUMIN 3.6   No results for input(s): "LIPASE", "AMYLASE" in the last 168 hours. No results for input(s): "AMMONIA" in the last 168 hours. Coagulation Profile: No results for input(s): "INR", "PROTIME" in the last 168 hours. Cardiac Enzymes: No results for input(s): "CKTOTAL", "CKMB", "CKMBINDEX", "TROPONINI" in the last 168 hours. BNP (last 3 results) No results for input(s): "PROBNP" in the last 8760 hours. HbA1C: No results for input(s): "HGBA1C" in the last 72 hours. CBG: No results for input(s): "GLUCAP" in the last 168 hours. Lipid Profile: No results for input(s): "CHOL", "HDL", "LDLCALC", "TRIG", "CHOLHDL", "LDLDIRECT" in the last 72 hours. Thyroid Function Tests: No results for input(s): "TSH", "T4TOTAL", "FREET4", "T3FREE", "THYROIDAB" in the last 72 hours. Anemia Panel: No results for input(s): "VITAMINB12", "FOLATE", "FERRITIN", "TIBC", "IRON", "RETICCTPCT" in the last 72 hours. Sepsis Labs: Recent Labs  Lab 09/12/23 1210 09/12/23 1430  LATICACIDVEN 2.1* 0.9    No results found for this or any previous visit (from the past 240 hours).  Radiology Studies: DG Chest 2 View Result Date: 09/12/2023 CLINICAL DATA:  Weakness. EXAM: CHEST - 2 VIEW COMPARISON:  X-ray 02/21/2023 and older FINDINGS: Stable cardiopericardial silhouette. No consolidation, pneumothorax or effusion. No edema. Chronic appearing interstitial lung changes identified. Degenerative changes of the spine. Overlapping cardiac leads. IMPRESSION: Chronic changes.  No acute cardiopulmonary disease. Electronically Signed   By: Adrianna Horde M.D.   On: 09/12/2023 14:07        Scheduled Meds:  alfuzosin  10 mg Oral QHS   amiodarone  200 mg Oral Daily   ascorbic acid  1,000 mg Oral Daily   Chlorhexidine  Gluconate Cloth  6 each Topical Q0600   chlorthalidone  12.5 mg Oral QODAY   cyanocobalamin  1,000 mcg Oral Daily   docusate sodium  100 mg Oral BID   finasteride  5 mg Oral Daily   [START ON 09/14/2023] levothyroxine  75 mcg Oral Q0600   losartan  25 mg Oral Daily   Rivaroxaban  15 mg Oral Daily   [START ON 09/14/2023] rosuvastatin  5 mg Oral QPM   Continuous Infusions:  cefTRIAXone (ROCEPHIN)  IV 1 g (09/13/23 0947)     LOS: 0 days    Time spent: 52 minutes spent on 09/13/2023 caring for this patient face-to-face including chart review, ordering labs/tests, documenting, discussion with nursing staff, consultants, updating family and interview/physical exam    Rema [facility] [place], DO [facility] Available via Epic secure chat 7am-7pm After these hours, please refer to coverage provider listed on [email or web address] 09/13/2023, 10:02 AM

## 2023-09-13 NOTE — TOC Progression Note (Addendum)
 Transition of Care [facility]) - Progression Note    Patient Details  Name: Tanner Ho MRN: [id number] Date of Birth: 1941-01-21  Transition of Care [facility]) CM/SW Contact  Katrine Parody, LCSW Phone Number: 09/13/2023, 1:31 PM  Clinical Narrative:      Diagnosis code: R33.9  Height: 5'10'  Weight: 152   Patient has Urinary Retention which requires lower extremities to be positioned in ways not feasible with a normal bed.     [name] requires frequent and immediate changes in body position which cannot be achieved with a normal bed.     Barriers to Discharge: Continued Medical Work up  Expected Discharge Plan and Services                                               Social Determinants of Health (SDOH) Interventions SDOH Screenings   Food Insecurity: No Food Insecurity (09/12/2023)  Housing: Low Risk  (09/12/2023)  Transportation Needs: No Transportation Needs (09/12/2023)  Utilities: Not At Risk (09/12/2023)  Social Connections: Moderately Integrated (09/12/2023)  Tobacco Use: Medium Risk (09/12/2023)    Readmission Risk Interventions     No data to display

## 2023-09-13 NOTE — Care Management Obs Status (Signed)
 MEDICARE OBSERVATION STATUS NOTIFICATION   Patient Details  Name: Tanner Ho MRN: [id number] Date of Birth: 03-06-1941   Medicare Observation Status Notification Given:  Yes    Trevione Wert I Alwin Baars, LCSW 09/13/2023, 10:06 AM

## 2023-09-13 NOTE — TOC Progression Note (Signed)
 Transition of Care [facility]) - Progression Note    Patient Details  Name: Tanner Ho MRN: [id number] Date of Birth: 18-Oct-1940  Transition of Care [facility]) CM/SW Contact  Katrine Parody, LCSW Phone Number: 09/13/2023, 12:52 PM  Clinical Narrative:    CSW visited pt and spouse at bedside for MOON. CSW later returned with copy of signed MOON for record, pt daughter was now visiting.  Discussed hospital bed recommended by PT. Pt and daughter in agreement that pt needs a hospital bed and would like assistance with this.    CSW subsequently sent [place] to MD confirming order for hospital bed.  CSW reached out to [facility] to refer pt. TOC to follow.     Barriers to Discharge: Continued Medical Work up  Expected Discharge Plan and Services                                               Social Determinants of Health (SDOH) Interventions SDOH Screenings   Food Insecurity: No Food Insecurity (09/12/2023)  Housing: Low Risk  (09/12/2023)  Transportation Needs: No Transportation Needs (09/12/2023)  Utilities: Not At Risk (09/12/2023)  Social Connections: Moderately Integrated (09/12/2023)  Tobacco Use: Medium Risk (09/12/2023)    Readmission Risk Interventions     No data to display

## 2023-09-14 DIAGNOSIS — Z7401 Bed confinement status: Secondary | ICD-10-CM | POA: Diagnosis not present

## 2023-09-14 DIAGNOSIS — N3000 Acute cystitis without hematuria: Secondary | ICD-10-CM | POA: Diagnosis not present

## 2023-09-14 DIAGNOSIS — N390 Urinary tract infection, site not specified: Secondary | ICD-10-CM | POA: Diagnosis not present

## 2023-09-14 DIAGNOSIS — R531 Weakness: Secondary | ICD-10-CM | POA: Diagnosis not present

## 2023-09-14 LAB — BASIC METABOLIC PANEL WITH GFR
Anion gap: 9 (ref 5–15)
BUN: 20 mg/dL (ref 8–23)
CO2: 25 mmol/L (ref 22–32)
Calcium: 8.7 mg/dL — ABNORMAL LOW (ref 8.9–10.3)
Chloride: 102 mmol/L (ref 98–111)
Creatinine, Ser: 1.18 mg/dL (ref 0.61–1.24)
GFR, Estimated: >60 mL/min (ref >60)
Glucose, Bld: 103 mg/dL — ABNORMAL HIGH (ref 70–99)
Potassium: 3.1 mmol/L — ABNORMAL LOW (ref 3.5–5.1)
Sodium: 136 mmol/L (ref 135–145)

## 2023-09-14 MED ORDER — AMOXICILLIN-POT CLAVULANATE 875-125 MG PO TABS: 1.0000 | ORAL_TABLET | Freq: Three times a day (TID) | ORAL | 0 refills | Status: AC

## 2023-09-14 MED ORDER — POTASSIUM CHLORIDE CRYS ER 20 MEQ PO TBCR
40.0000 meq | EXTENDED_RELEASE_TABLET | Freq: Once | ORAL | Status: AC
  Administered 2023-09-14: 40 meq via ORAL
  Filled 2023-09-14: qty 2

## 2023-09-14 NOTE — Discharge Summary (Signed)
 Physician Discharge Summary   Patient: Tanner Ho MRN: [id number] DOB: 11-03-1940  Admit date:     09/12/2023  Discharge date: 09/14/23  Discharge Physician: Lorita Rosa   PCP: Imelda Man, MD   Recommendations at discharge:   Foley catheter will remain in place at the time of discharge; follow up with Dr. Derrick Fling in 1 week Complete antibiotics (Augmentin 3 times daily for 7 more days) You are being discharged home with hospice Hospital bed is ordered and should be delivered prior to your arrival at home DNR form is signed and in your chart Outpatient neurology consult requested Follow up with Dr. Schuyler Custard in 1 week  Discharge Diagnoses: Principal Problem:   UTI (urinary tract infection) Active Problems:   PAF (paroxysmal atrial fibrillation), recurrent 03/12/12   Chronic anticoagulation   Chronic renal disease, stage III, baseline SCr 1.5   HTN (hypertension)   Dyslipidemia   Hypothyroid   BPH (benign prostatic hyperplasia)   Urinary retention  Hospital Course: 83yo with h/o stage 3b CKD, BPH with urinary retention, afib on Xarelto, and HTN who presented on 4/11 with generalized weakness and was found to have UTI.  Foley placed, likely will need at time of dc.  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 Derrick Fling - recently seen in clinic and instructed to self catheter of the eyes as needed at home Patient was able to perform x 1 but concern for bleeding postcatheterization likely complicated by history of Xarelto use Foley catheter placed Continue Finasteride and Alfuzosin  Will continue Foley catheter on discharge with outpatient follow-up with urology for voiding trial versus definitive management of BPH   Urinary tract infection Urinalysis with large leukocytes, large hemoglobin, few bacteria, 20-50 WBCs, likely complicated by urinary retention as above Urine culture with >100k colonies E coli Ceftriaxone  -> Augmentin for 10 total days   CKD stage IIIb Baseline creatinine 1.6/1.7 over the past 12 months Creatinine on admission 1.49, stable Avoid nephrotoxic agents where possible   Paroxysmal atrial fibrillation on anticoagulation Essential hypertension Continue Amiodarone and Lopressor for rate control Continue Chlorthalidone, lopressor, and Losartan for BP control Continue Xarelto for AC   Hypothyroidism Recent TSH WNL Continue Levothyroxine    Hyperlipidemia Continue Crestor 5 mg    Vitamin B12 deficiency Continue Cyanocobalamin 1000 mcg p.o. daily   Weakness/debility/deconditioning/GOC Currently lives at home with spouse Currently utilizes a walker and wheelchair for mobility Spouse reports utilizing wheelchair more frequently now PT/OT consultation -> SNF recommended but family is declining Would prefer hospice at home (palliative if he doesn't qualify) Patient is DNR Outpatient neurology evaluation requested by daughter re: possible rigidity (not noted on today's exam)    Consultants: PT TOC team  Procedures: None  Antibiotics: Ceftriaxone 4/11-13 Augmentin 4/13-15       Pain control - [place]  Controlled Substance Reporting System database was reviewed. and patient was instructed, not to drive, operate heavy machinery, perform activities at heights, swimming or participation in water activities or provide baby-sitting services while on Pain, Sleep and Anxiety Medications; until their outpatient Physician has advised to do so again. Also recommended to not to take more than prescribed Pain, Sleep and Anxiety Medications.   Disposition: Home Diet recommendation:  Regular diet DISCHARGE MEDICATION: Allergies as of 09/14/2023       Reactions   Ciprofloxacin Other (See Comments)   Cannot take due to currently taking Amiodarone   Hytrin [terazosin] Other (See Comments)  Syncopal event    Antihistamines, Chlorpheniramine-type Other (See Comments)    Enlarged prostate   Doxycycline Other (See Comments)   Reaction not recalled   Fexofenadine-pseudoephed Er Other (See Comments)   Enlarged prostate   Pseudoephedrine Other (See Comments)   Enlarged prostate        Medication List     STOP taking these medications    amoxicillin 500 MG capsule Commonly known as: AMOXIL   loteprednol 0.5 % ophthalmic suspension Commonly known as: LOTEMAX   potassium chloride SA 20 MEQ tablet Commonly known as: KLOR-CON M   traMADol 50 MG tablet Commonly known as: ULTRAM       TAKE these medications    acetaminophen 325 MG tablet Commonly known as: TYLENOL Take 325-650 mg by mouth every 6 (six) hours as needed for moderate pain (pain score 4-6).   alfuzosin 10 MG 24 hr tablet Commonly known as: UROXATRAL Take 10 mg by mouth at bedtime.   ALIGN PO Take 1 capsule by mouth in the morning.   amiodarone 200 MG tablet Commonly known as: PACERONE Take 1 tablet (200 mg total) by mouth daily.   amoxicillin-clavulanate 875-125 MG tablet Commonly known as: AUGMENTIN Take 1 tablet by mouth 3 (three) times daily for 7 days.   ascorbic acid 1000 MG tablet Commonly known as: VITAMIN C Take 1,000 mg by mouth daily.   benzonatate 200 MG capsule Commonly known as: TESSALON Take 200 mg by mouth 3 (three) times daily as needed for cough. What changed: Another medication with the same name was removed. Continue taking this medication, and follow the directions you see here.   chlorthalidone 25 MG tablet Commonly known as: HYGROTON Take 0.5 tablets (12.5 mg total) by mouth daily. What changed: when to take this   cyanocobalamin 1000 MCG tablet Commonly known as: VITAMIN B12 Take 1,000 mcg by mouth daily.   docusate sodium 100 MG capsule Commonly known as: COLACE Take 100 mg by mouth 2 (two) times daily.   finasteride 5 MG tablet Commonly known as: PROSCAR Take 5 mg by mouth at bedtime.   levothyroxine 75 MCG tablet Commonly known  as: SYNTHROID Take 75 mcg by mouth daily before breakfast.   linaclotide 145 MCG Caps capsule Commonly known as: Linzess Take 1 capsule (145 mcg total) by mouth as needed. What changed:  when to take this reasons to take this   loratadine 10 MG tablet Commonly known as: CLARITIN Take 10 mg by mouth daily.   losartan 25 MG tablet Commonly known as: COZAAR Take 1 tablet (25 mg total) by mouth daily.   metoprolol tartrate 25 MG tablet Commonly known as: LOPRESSOR Take 0.5 tablets (12.5 mg total) by mouth 2 (two) times daily as needed (afib).   MiraLax 17 GM/SCOOP powder Generic drug: polyethylene glycol powder Take 17 g by mouth daily as needed for mild constipation.   Rivaroxaban 15 MG Tabs tablet Commonly known as: Xarelto Take 1 tablet (15 mg total) by mouth daily with supper. What changed: when to take this   rosuvastatin 5 MG tablet Commonly known as: CRESTOR TAKE 1 TABLET BY MOUTH DAILY AT 6 PM. Please call office to schedule an appt for further refills. Thank you What changed:  how much to take how to take this when to take this additional instructions   simethicone 125 MG chewable tablet Commonly known as: MYLICON Chew 125 mg by mouth every 6 (six) hours as needed for flatulence.   sodium chloride 0.65 % Soln  nasal spray Commonly known as: OCEAN Place 1 spray into both nostrils as needed for congestion.   Systane Hydration PF 0.4-0.3 % Soln Generic drug: Polyethyl Glyc-Propyl Glyc PF Place 1 drop into both eyes 3 (three) times daily as needed (for dryness).   Vitamin D3 50 MCG (2000 UT) Chew Chew 2,000 Units by mouth daily.               Durable Medical Equipment  (From admission, onward)           Start     Ordered   09/13/23 1248  For home use only DME Hospital bed  Once       Question Answer Comment  Length of Need Lifetime   The above medical condition requires: Patient requires the ability to reposition frequently   Head must be  elevated greater than: 30 degrees   Bed type Semi-electric      09/13/23 1247            Follow-up Information     Imelda Man, MD. Schedule an appointment as soon as possible for a visit in 1 week(s).   Specialty: Internal Medicine Contact information: [address] [place] [address] [phone]         Christina Coyer, MD. Schedule an appointment as soon as possible for a visit in 1 week(s).   Specialty: Urology Contact information: [address] [place] [address] [phone]                Discharge Exam:    Subjective: Pleasant, confused. No complaints.   Objective: Vitals:   09/14/23 0948 09/14/23 1215  BP: (!) 157/70 (!) 147/68  Pulse: 72 61  Resp:  18  Temp:  97.9 F (36.6 C)  SpO2:  96%    Intake/Output Summary (Last 24 hours) at 09/14/2023 1523 Last data filed at 09/14/2023 1500 Gross per 24 hour  Intake 700 ml  Output 2050 ml  Net -1350 ml   Filed Weights   09/12/23 1607  Weight: 69.1 kg    Exam:  General:  Appears calm and comfortable and is in NAD, frail, cachectic Eyes:  Chronic visual impairment ENT:  grossly normal hearing, lips & tongue, mmm Cardiovascular:  RRR, no m/r/g. No LE edema.  Respiratory:   CTA bilaterally with no wheezes/rales/rhonchi.  Normal respiratory effort. Abdomen:  soft, NT, ND Skin:  no rash or induration seen on limited exam Musculoskeletal:  muscle wasting diffusely, no bony abnormality Psychiatric:  pleasantly confused mood and affect, speech appropriate Neurologic:  CN 2-12 grossly intact, moves all extremities in coordinated fashion, no rigidity appreciated  Data Reviewed: I have reviewed the patient's lab results since admission.  Pertinent labs for today include:  K+ 3.1 Urine culture >100k colonies GNR     Condition at discharge: improving  The results of significant diagnostics from this hospitalization (including imaging, microbiology, ancillary and  laboratory) are listed below for reference.   Imaging Studies: DG Chest 2 View Result Date: 09/12/2023 CLINICAL DATA:  Weakness. EXAM: CHEST - 2 VIEW COMPARISON:  X-ray 02/21/2023 and older FINDINGS: Stable cardiopericardial silhouette. No consolidation, pneumothorax or effusion. No edema. Chronic appearing interstitial lung changes identified. Degenerative changes of the spine. Overlapping cardiac leads. IMPRESSION: Chronic changes.  No acute cardiopulmonary disease. Electronically Signed   By: Adrianna Horde M.D.   On: 09/12/2023 14:07   ECHOCARDIOGRAM COMPLETE Result Date: 08/27/2023    ECHOCARDIOGRAM REPORT   Patient Name:   YOSHITO GAZA  Albea Date of Exam: 08/27/2023 Medical Rec #:  [id number]       Height:       66.0 in Accession #:    [id number]      Weight:       150.0 lb Date of Birth:  August 01, 1940        BSA:          1.770 m Patient Age:    83 years        BP:           120/50 mmHg Patient Gender: M               HR:           65 bpm. Exam Location:  Outpatient Procedure: 2D Echo, Cardiac Doppler and Color Doppler (Both Spectral and Color            Flow Doppler were utilized during procedure). Indications:    Mitral valve prolapse [I34.1 (ICD-10-CM)]  History:        Patient has prior history of Echocardiogram examinations, most                 recent 09/04/2016. Arrythmias:paroxysmal atrial fibrillation,                 nonsustained ventricular tacycardia and PAC; Risk                 Factors:Hypertension, Dyslipidemia and Former Smoker.  Sonographer:    Delford Felling MHA, RDMS, RVT, RDCS Referring Phys: [id number] MADISON L F[place]  Sonographer Comments: Restricted mobility. IMPRESSIONS  1. Left ventricular ejection fraction, by estimation, is 65 to 70%. Left ventricular ejection fraction by PLAX is 67 %. The left ventricle has normal function. The left ventricle has no regional wall motion abnormalities. There is moderate left ventricular hypertrophy. Left ventricular diastolic parameters are  consistent with Grade I diastolic dysfunction (impaired relaxation). Elevated left ventricular end-diastolic pressure.  2. Right ventricular systolic function is low normal. The right ventricular size is normal. Tricuspid regurgitation signal is inadequate for assessing PA pressure.  3. Right atrial size was mildly dilated.  4. No prolapse. The mitral valve is grossly normal. Trivial mitral valve regurgitation.  5. The aortic valve is calcified. Aortic valve regurgitation is mild.  6. The inferior vena cava is normal in size with greater than 50% respiratory variability, suggesting right atrial pressure of 3 mmHg. Comparison(s): Prior images unable to be directly viewed, comparison made by report only. Changes from prior study are noted. 09/04/2016: LVEF 55-60%, mild late systolic bileaflet prolapse, mild AI. FINDINGS  Left Ventricle: Left ventricular ejection fraction, by estimation, is 65 to 70%. Left ventricular ejection fraction by PLAX is 67 %. The left ventricle has normal function. The left ventricle has no regional wall motion abnormalities. Global longitudinal strain performed but not reported based on interpreter judgement due to suboptimal tracking. 3D ejection fraction reviewed and evaluated as part of the interpretation. Alternate measurement of EF is felt to be most reflective of LV function.  The left ventricular internal cavity size was normal in size. There is moderate left ventricular hypertrophy. Left ventricular diastolic parameters are consistent with Grade I diastolic dysfunction (impaired relaxation). Elevated left ventricular end-diastolic pressure. Right Ventricle: The right ventricular size is normal. No increase in right ventricular wall thickness. Right ventricular systolic function is low normal. Tricuspid regurgitation signal is inadequate for assessing PA pressure. Left Atrium: Left atrial size was normal in size. Right Atrium: Right atrial size  was mildly dilated. Pericardium: There is  no evidence of pericardial effusion. Mitral Valve: No prolapse. The mitral valve is grossly normal. Trivial mitral valve regurgitation. Tricuspid Valve: The tricuspid valve is grossly normal. Tricuspid valve regurgitation is trivial. Aortic Valve: The aortic valve is calcified. Aortic valve regurgitation is mild. Aortic regurgitation PHT measures 884 msec. Aortic valve mean gradient measures 4.0 mmHg. Aortic valve peak gradient measures 8.0 mmHg. Aortic valve area, by VTI measures 3.12 cm. Pulmonic Valve: The pulmonic valve was normal in structure. Pulmonic valve regurgitation is trivial. Aorta: The aortic root and ascending aorta are structurally normal, with no evidence of dilitation. Venous: The inferior vena cava is normal in size with greater than 50% respiratory variability, suggesting right atrial pressure of 3 mmHg. IAS/Shunts: No atrial level shunt detected by color flow Doppler.  LEFT VENTRICLE PLAX 2D LV EF:         Left            Diastology                ventricular     LV e' medial:    5.00 cm/s                ejection        LV E/e' medial:  24.0                fraction by     LV e' lateral:   7.29 cm/s                PLAX is 67      LV E/e' lateral: 16.5                %. LVIDd:         3.30 cm LVIDs:         2.11 cm LV PW:         1.24 cm LV IVS:        1.42 cm         3D Volume EF: LVOT diam:     1.86 cm         3D EF:        57 % LV SV:         83              LV EDV:       177 ml LV SV Index:   47              LV ESV:       77 ml LVOT Area:     2.72 cm        LV SV:        100 ml  RIGHT VENTRICLE            IVC RV Basal diam:  3.33 cm    IVC diam: 0.87 cm RV Mid diam:    2.57 cm RV S prime:     9.79 cm/s TAPSE (M-mode): 2.4 cm LEFT ATRIUM             Index        RIGHT ATRIUM           Index LA diam:        2.95 cm 1.67 cm/m   RA Area:     21.20 cm LA Vol (A2C):   36.9 ml 20.85 ml/m  RA Volume:   62.40 ml  35.26 ml/m LA Vol (A4C):   39.4 ml  22.26 ml/m LA Biplane Vol: 40.0 ml 22.60 ml/m   AORTIC VALVE AV Area (Vmax):    2.81 cm AV Area (Vmean):   2.81 cm AV Area (VTI):     3.12 cm AV Vmax:           141.00 cm/s AV Vmean:          97.700 cm/s AV VTI:            0.267 m AV Peak Grad:      8.0 mmHg AV Mean Grad:      4.0 mmHg LVOT Vmax:         146.00 cm/s LVOT Vmean:        101.000 cm/s LVOT VTI:          0.307 m LVOT/AV VTI ratio: 1.15 AI PHT:            884 msec AR Vena Contracta: 0.30 cm  AORTA Ao Root diam: 3.68 cm Ao Asc diam:  3.53 cm MITRAL VALVE MV Area (PHT): 2.87 cm     SHUNTS MV Decel Time: 264 msec     Systemic VTI:  0.31 m MR Peak grad: 7.2 mmHg      Systemic Diam: 1.86 cm MR Vmax:      134.00 cm/s MV E velocity: 120.00 cm/s MV A velocity: 101.00 cm/s MV E/A ratio:  1.19 Dinah Franco MD Electronically signed by Dinah Franco MD Signature Date/Time: 08/27/2023/2:40:28 PM    Final     Microbiology: Results for orders placed or performed during the hospital encounter of 09/12/23  Urine Culture     Status: Abnormal (Preliminary result)   Collection Time: 09/12/23  1:48 PM   Specimen: Urine, Random  Result Value Ref Range Status   Specimen Description   Final    URINE, RANDOM Performed at [facility] [address], [place], [place] [address]    Special Requests   Final    NONE Reflexed from [id number] Performed at [facility], [address], [place], [place] [address]    Culture (A)  Final    >=100,000 COLONIES/mL ESCHERICHIA COLI SUSCEPTIBILITIES TO FOLLOW Performed at [facility], [address], [place], [place] [address]    Report Status PENDING  Incomplete    Labs: CBC: Recent Labs  Lab 09/12/23 1202 09/13/23 0454  WBC 8.9 8.3  NEUTROABS 7.1  --   HGB 12.2* 11.3*  HCT 37.3* 33.2*  MCV 105.4* 107.4*  PLT 189 168   Basic Metabolic Panel: Recent Labs  Lab 09/12/23 1202 09/13/23 0454 09/14/23 0446  NA 137 137 136  K 3.9 3.9 3.1*  CL 100 103 102  CO2 29 29 25   GLUCOSE 144* 94 103*  BUN 30* 23 20   CREATININE 1.49* 1.29* 1.18  CALCIUM 9.4 9.0 8.7*   Liver Function Tests: Recent Labs  Lab 09/12/23 1202  AST 19  ALT 13  ALKPHOS 139*  BILITOT 0.7  PROT 6.9  ALBUMIN 3.6   CBG: No results for input(s): "GLUCAP" in the last 168 hours.  Discharge time spent: greater than 30 minutes.  Signed: Lorita Rosa, MD [facility] 09/14/2023

## 2023-09-14 NOTE — Plan of Care (Signed)

## 2023-09-14 NOTE — Hospital Course (Signed)
 83yo with h/o stage 3b CKD, BPH with urinary retention, afib on Xarelto, and HTN who presented on 4/11 with generalized weakness and was found to have UTI.  Foley placed, likely will need at time of dc.

## 2023-09-14 NOTE — Progress Notes (Signed)
 Physical Therapy Treatment Patient Details Name: Tanner Ho MRN: [id number] DOB: November 24, 1940 Today's Date: 09/14/2023   History of Present Illness Patient is a 83 year old male who presented with weakness and urinary retention.  Patient was admitted with possible UTI.  PMH: BPH, hyperlipidemia, paroxysmal a fib,    PT Comments  Pt more confused today per dtr, very pleasant and willing to work with PT.  Repeated STS x4 using recliner back to promote trunk extension with eventual transition to RW for stand pivot to recliner.  Encouraged OOB time, pt dtr present for full session. Continue to follow in acute setting, HHPT vs SNF pending progress and family ability to provide care.     If plan is discharge home, recommend the following: Two people to help with walking and/or transfers;A lot of help with bathing/dressing/bathroom;Assistance with cooking/housework;Assist for transportation;Help with stairs or ramp for entrance;Supervision due to cognitive status   Can travel by private vehicle     No  Equipment Recommendations  Hospital bed    Recommendations for Other Services       Precautions / Restrictions Precautions Precautions: Fall     Mobility  Bed Mobility Overal bed mobility: Needs Assistance Bed Mobility: Supine to Sit     Supine to sit: Mod assist, +2 for safety/equipment, +2 for physical assistance     General bed mobility comments: with increased time and cues for positioning.  assist for upper and lower body    Transfers Overall transfer level: Needs assistance Equipment used: Rolling walker (2 wheels) (other) Transfers: Sit to/from Stand, Bed to chair/wheelchair/BSC Sit to Stand: Min assist, Mod assist, +2 physical assistance, +2 safety/equipment   Step pivot transfers: Min assist, Mod assist, +2 physical assistance, +2 safety/equipment       General transfer comment: repeated STS from bed to recliner back(pt facing back of recliner with hands on back).  improved abiltiy to self assist with repetaed trials; cues for cervical, knee and hip extension. transitioned to standing to RW. able to take steps fwd and back to chair with assist for RW position/control and balance    Ambulation/Gait                   Stairs             Wheelchair Mobility     Tilt Bed    Modified Rankin (Stroke Patients Only)       Balance   Sitting-balance support: Feet supported, Bilateral upper extremity supported Sitting balance-Leahy Scale: Fair     Standing balance support: Bilateral upper extremity supported, Reliant on assistive device for balance, During functional activity Standing balance-Leahy Scale: Poor                              Communication Communication Communication: Impaired Factors Affecting Communication: Hearing impaired;Other (comment) (macular degneration)  Cognition Arousal: Alert Behavior During Therapy: WFL for tasks assessed/performed   PT - Cognitive impairments: Memory                       PT - Cognition Comments: more confused today per dtr Following commands: Intact      Cueing Cueing Techniques: Verbal cues, Tactile cues  Exercises      General Comments        Pertinent Vitals/Pain Pain Assessment Pain Assessment: No/denies pain Pain Intervention(s): Monitored during session    Home Living  Prior Function            PT Goals (current goals can now be found in the care plan section) Acute Rehab PT Goals PT Goal Formulation: With patient/family Time For Goal Achievement: 09/27/23 Potential to Achieve Goals: Good Progress towards PT goals: Progressing toward goals    Frequency    Min 3X/week      PT Plan      Co-evaluation              AM-PAC PT "6 Clicks" Mobility   Outcome Measure  Help needed turning from your back to your side while in a flat bed without using bedrails?: A Lot Help needed moving from  lying on your back to sitting on the side of a flat bed without using bedrails?: Total Help needed moving to and from a bed to a chair (including a wheelchair)?: Total Help needed standing up from a chair using your arms (e.g., wheelchair or bedside chair)?: Total Help needed to walk in hospital room?: Total Help needed climbing 3-5 steps with a railing? : Total 6 Click Score: 7    End of Session Equipment Utilized During Treatment: Gait belt Activity Tolerance: Patient tolerated treatment well Patient left: in chair;with call bell/phone within reach;with family/visitor present   PT Visit Diagnosis: Other abnormalities of gait and mobility (R26.89);Other symptoms and signs involving the nervous system (R29.898)     Time: 0454-0981 PT Time Calculation (min) (ACUTE ONLY): 21 min  Charges:    $Neuromuscular Re-education: 8-22 mins PT General Charges $$ ACUTE PT VISIT: 1 Visit                     Etna Forquer, PT  Acute Rehab Dept [facility]) [phone]  09/14/2023    [facility] 09/14/2023, 3:07 PM

## 2023-09-14 NOTE — TOC Progression Note (Signed)
 Transition of Care [facility]) - Progression Note    Patient Details  Name: Tanner Ho MRN: [id number] Date of Birth: [birth year]  Transition of Care [facility]) CM/SW Contact  Katrine Parody, LCSW Phone Number: 09/14/2023, 4:48 PM  Clinical Narrative:    CSW contacted by MD secure chat- inquiring if HB has been delivered- as needs to arrive at home before pt. Also [facility] requested for referral for in home [facility].  CSW agreed to work on both.   CSW contacted spouse who was at home, she has been in touch with [employer]- but they were uncertain when pt DC. According to spouse, CSW agreed to call them and take care of it.  Reached out to [employer] contact advised pt potentially DC and MD expects bed at home, agreed to work on this today. CSW then spoke to daughter who was at hospital visiting- she wants to be sure there are full side rails as pt has been confused today,and also Hospice referral at [facility].  Advsed daughter [employer] responded and likely will visit while mom at home. Daughter stated they have made space for bed. Finally she inquired about DNR conversation that she had with MD.   CSW followed up with [employer] restating pt requesting full rails-contact agreed by text to change order. CSW also called [facility] and made an initial referral. CSW then [facility] to MD letting her know that daughter is inquiring about DNR conversation and Gold sheet at DC. TOC to follow.        Barriers to Discharge: Continued Medical Work up  Expected Discharge Plan and Services         Expected Discharge Date: 09/14/23                                     Social Determinants of Health (SDOH) Interventions SDOH Screenings   Food Insecurity: No Food Insecurity (09/12/2023)  Housing: Low Risk  (09/12/2023)  Transportation Needs: No Transportation Needs (09/12/2023)  Utilities: Not At Risk (09/12/2023)  Social Connections: Moderately Integrated (09/12/2023)  Tobacco Use: Medium Risk (09/12/2023)    Readmission  Risk Interventions     No data to display

## 2023-09-15 LAB — URINE CULTURE: Culture: >=100000 — AB

## 2023-09-16 DIAGNOSIS — F03911 Unspecified dementia, unspecified severity, with agitation: Secondary | ICD-10-CM | POA: Diagnosis not present

## 2023-09-16 DIAGNOSIS — E039 Hypothyroidism, unspecified: Secondary | ICD-10-CM | POA: Diagnosis not present

## 2023-09-16 DIAGNOSIS — N401 Enlarged prostate with lower urinary tract symptoms: Secondary | ICD-10-CM | POA: Diagnosis not present

## 2023-09-16 DIAGNOSIS — L219 Seborrheic dermatitis, unspecified: Secondary | ICD-10-CM | POA: Diagnosis not present

## 2023-09-16 DIAGNOSIS — K5904 Chronic idiopathic constipation: Secondary | ICD-10-CM | POA: Diagnosis not present

## 2023-09-16 DIAGNOSIS — R339 Retention of urine, unspecified: Secondary | ICD-10-CM | POA: Diagnosis not present

## 2023-09-16 DIAGNOSIS — I480 Paroxysmal atrial fibrillation: Secondary | ICD-10-CM | POA: Diagnosis not present

## 2023-09-16 DIAGNOSIS — L89150 Pressure ulcer of sacral region, unstageable: Secondary | ICD-10-CM | POA: Diagnosis not present

## 2023-09-16 DIAGNOSIS — G311 Senile degeneration of brain, not elsewhere classified: Secondary | ICD-10-CM | POA: Diagnosis not present

## 2023-09-16 DIAGNOSIS — Z87440 Personal history of urinary (tract) infections: Secondary | ICD-10-CM | POA: Diagnosis not present

## 2023-09-16 DIAGNOSIS — N183 Chronic kidney disease, stage 3 unspecified: Secondary | ICD-10-CM | POA: Diagnosis not present

## 2023-09-16 DIAGNOSIS — Z466 Encounter for fitting and adjustment of urinary device: Secondary | ICD-10-CM | POA: Diagnosis not present

## 2023-09-16 DIAGNOSIS — I129 Hypertensive chronic kidney disease with stage 1 through stage 4 chronic kidney disease, or unspecified chronic kidney disease: Secondary | ICD-10-CM | POA: Diagnosis not present

## 2023-09-16 DIAGNOSIS — J309 Allergic rhinitis, unspecified: Secondary | ICD-10-CM | POA: Diagnosis not present

## 2023-09-16 DIAGNOSIS — Z9181 History of falling: Secondary | ICD-10-CM | POA: Diagnosis not present

## 2023-09-17 DIAGNOSIS — I129 Hypertensive chronic kidney disease with stage 1 through stage 4 chronic kidney disease, or unspecified chronic kidney disease: Secondary | ICD-10-CM | POA: Diagnosis not present

## 2023-09-17 DIAGNOSIS — G311 Senile degeneration of brain, not elsewhere classified: Secondary | ICD-10-CM | POA: Diagnosis not present

## 2023-09-17 DIAGNOSIS — N401 Enlarged prostate with lower urinary tract symptoms: Secondary | ICD-10-CM | POA: Diagnosis not present

## 2023-09-17 DIAGNOSIS — F03911 Unspecified dementia, unspecified severity, with agitation: Secondary | ICD-10-CM | POA: Diagnosis not present

## 2023-09-17 DIAGNOSIS — I480 Paroxysmal atrial fibrillation: Secondary | ICD-10-CM | POA: Diagnosis not present

## 2023-09-17 DIAGNOSIS — N183 Chronic kidney disease, stage 3 unspecified: Secondary | ICD-10-CM | POA: Diagnosis not present

## 2023-09-18 DIAGNOSIS — F03911 Unspecified dementia, unspecified severity, with agitation: Secondary | ICD-10-CM | POA: Diagnosis not present

## 2023-09-18 DIAGNOSIS — I129 Hypertensive chronic kidney disease with stage 1 through stage 4 chronic kidney disease, or unspecified chronic kidney disease: Secondary | ICD-10-CM | POA: Diagnosis not present

## 2023-09-18 DIAGNOSIS — G311 Senile degeneration of brain, not elsewhere classified: Secondary | ICD-10-CM | POA: Diagnosis not present

## 2023-09-18 DIAGNOSIS — N183 Chronic kidney disease, stage 3 unspecified: Secondary | ICD-10-CM | POA: Diagnosis not present

## 2023-09-18 DIAGNOSIS — N401 Enlarged prostate with lower urinary tract symptoms: Secondary | ICD-10-CM | POA: Diagnosis not present

## 2023-09-18 DIAGNOSIS — I480 Paroxysmal atrial fibrillation: Secondary | ICD-10-CM | POA: Diagnosis not present

## 2023-09-19 NOTE — Transitions of Care (Post Inpatient/ED Visit) (Signed)
   09/19/2023  Name: Tanner Ho MRN: [id number] DOB: 04/21/1941  Today's TOC FU Call Status: Chart Review for transitions of care.  Patient observation status.    Cathrine Krizan J. Lillyanne Bradburn RN, MSN [facility], [facility] RN Care Manager Direct Dial: [phone]  Fax: [phone] Website: [email or web address]

## 2023-09-22 DIAGNOSIS — N183 Chronic kidney disease, stage 3 unspecified: Secondary | ICD-10-CM | POA: Diagnosis not present

## 2023-09-22 DIAGNOSIS — F03911 Unspecified dementia, unspecified severity, with agitation: Secondary | ICD-10-CM | POA: Diagnosis not present

## 2023-09-22 DIAGNOSIS — G311 Senile degeneration of brain, not elsewhere classified: Secondary | ICD-10-CM | POA: Diagnosis not present

## 2023-09-22 DIAGNOSIS — N401 Enlarged prostate with lower urinary tract symptoms: Secondary | ICD-10-CM | POA: Diagnosis not present

## 2023-09-22 DIAGNOSIS — I129 Hypertensive chronic kidney disease with stage 1 through stage 4 chronic kidney disease, or unspecified chronic kidney disease: Secondary | ICD-10-CM | POA: Diagnosis not present

## 2023-09-22 DIAGNOSIS — I480 Paroxysmal atrial fibrillation: Secondary | ICD-10-CM | POA: Diagnosis not present

## 2023-09-29 DIAGNOSIS — N401 Enlarged prostate with lower urinary tract symptoms: Secondary | ICD-10-CM | POA: Diagnosis not present

## 2023-09-29 DIAGNOSIS — N183 Chronic kidney disease, stage 3 unspecified: Secondary | ICD-10-CM | POA: Diagnosis not present

## 2023-09-29 DIAGNOSIS — G311 Senile degeneration of brain, not elsewhere classified: Secondary | ICD-10-CM | POA: Diagnosis not present

## 2023-09-29 DIAGNOSIS — I480 Paroxysmal atrial fibrillation: Secondary | ICD-10-CM | POA: Diagnosis not present

## 2023-09-29 DIAGNOSIS — F03911 Unspecified dementia, unspecified severity, with agitation: Secondary | ICD-10-CM | POA: Diagnosis not present

## 2023-09-29 DIAGNOSIS — I129 Hypertensive chronic kidney disease with stage 1 through stage 4 chronic kidney disease, or unspecified chronic kidney disease: Secondary | ICD-10-CM | POA: Diagnosis not present

## 2023-10-02 DIAGNOSIS — G311 Senile degeneration of brain, not elsewhere classified: Secondary | ICD-10-CM | POA: Diagnosis not present

## 2023-10-02 DIAGNOSIS — F03911 Unspecified dementia, unspecified severity, with agitation: Secondary | ICD-10-CM | POA: Diagnosis not present

## 2023-10-02 DIAGNOSIS — I480 Paroxysmal atrial fibrillation: Secondary | ICD-10-CM | POA: Diagnosis not present

## 2023-10-02 DIAGNOSIS — N401 Enlarged prostate with lower urinary tract symptoms: Secondary | ICD-10-CM | POA: Diagnosis not present

## 2023-10-02 DIAGNOSIS — Z9181 History of falling: Secondary | ICD-10-CM | POA: Diagnosis not present

## 2023-10-02 DIAGNOSIS — K5904 Chronic idiopathic constipation: Secondary | ICD-10-CM | POA: Diagnosis not present

## 2023-10-02 DIAGNOSIS — J309 Allergic rhinitis, unspecified: Secondary | ICD-10-CM | POA: Diagnosis not present

## 2023-10-02 DIAGNOSIS — E039 Hypothyroidism, unspecified: Secondary | ICD-10-CM | POA: Diagnosis not present

## 2023-10-02 DIAGNOSIS — N183 Chronic kidney disease, stage 3 unspecified: Secondary | ICD-10-CM | POA: Diagnosis not present

## 2023-10-02 DIAGNOSIS — R339 Retention of urine, unspecified: Secondary | ICD-10-CM | POA: Diagnosis not present

## 2023-10-02 DIAGNOSIS — L89150 Pressure ulcer of sacral region, unstageable: Secondary | ICD-10-CM | POA: Diagnosis not present

## 2023-10-02 DIAGNOSIS — Z87440 Personal history of urinary (tract) infections: Secondary | ICD-10-CM | POA: Diagnosis not present

## 2023-10-02 DIAGNOSIS — L219 Seborrheic dermatitis, unspecified: Secondary | ICD-10-CM | POA: Diagnosis not present

## 2023-10-02 DIAGNOSIS — I129 Hypertensive chronic kidney disease with stage 1 through stage 4 chronic kidney disease, or unspecified chronic kidney disease: Secondary | ICD-10-CM | POA: Diagnosis not present

## 2023-10-02 DIAGNOSIS — Z466 Encounter for fitting and adjustment of urinary device: Secondary | ICD-10-CM | POA: Diagnosis not present

## 2023-10-03 DIAGNOSIS — I480 Paroxysmal atrial fibrillation: Secondary | ICD-10-CM | POA: Diagnosis not present

## 2023-10-03 DIAGNOSIS — I129 Hypertensive chronic kidney disease with stage 1 through stage 4 chronic kidney disease, or unspecified chronic kidney disease: Secondary | ICD-10-CM | POA: Diagnosis not present

## 2023-10-03 DIAGNOSIS — N401 Enlarged prostate with lower urinary tract symptoms: Secondary | ICD-10-CM | POA: Diagnosis not present

## 2023-10-03 DIAGNOSIS — F03911 Unspecified dementia, unspecified severity, with agitation: Secondary | ICD-10-CM | POA: Diagnosis not present

## 2023-10-03 DIAGNOSIS — N183 Chronic kidney disease, stage 3 unspecified: Secondary | ICD-10-CM | POA: Diagnosis not present

## 2023-10-03 DIAGNOSIS — G311 Senile degeneration of brain, not elsewhere classified: Secondary | ICD-10-CM | POA: Diagnosis not present

## 2023-10-04 DIAGNOSIS — I480 Paroxysmal atrial fibrillation: Secondary | ICD-10-CM | POA: Diagnosis not present

## 2023-10-04 DIAGNOSIS — F03911 Unspecified dementia, unspecified severity, with agitation: Secondary | ICD-10-CM | POA: Diagnosis not present

## 2023-10-04 DIAGNOSIS — I129 Hypertensive chronic kidney disease with stage 1 through stage 4 chronic kidney disease, or unspecified chronic kidney disease: Secondary | ICD-10-CM | POA: Diagnosis not present

## 2023-10-04 DIAGNOSIS — G311 Senile degeneration of brain, not elsewhere classified: Secondary | ICD-10-CM | POA: Diagnosis not present

## 2023-10-04 DIAGNOSIS — N183 Chronic kidney disease, stage 3 unspecified: Secondary | ICD-10-CM | POA: Diagnosis not present

## 2023-10-04 DIAGNOSIS — N401 Enlarged prostate with lower urinary tract symptoms: Secondary | ICD-10-CM | POA: Diagnosis not present

## 2023-10-05 DIAGNOSIS — N401 Enlarged prostate with lower urinary tract symptoms: Secondary | ICD-10-CM | POA: Diagnosis not present

## 2023-10-05 DIAGNOSIS — N183 Chronic kidney disease, stage 3 unspecified: Secondary | ICD-10-CM | POA: Diagnosis not present

## 2023-10-05 DIAGNOSIS — I480 Paroxysmal atrial fibrillation: Secondary | ICD-10-CM | POA: Diagnosis not present

## 2023-10-05 DIAGNOSIS — G311 Senile degeneration of brain, not elsewhere classified: Secondary | ICD-10-CM | POA: Diagnosis not present

## 2023-10-05 DIAGNOSIS — F03911 Unspecified dementia, unspecified severity, with agitation: Secondary | ICD-10-CM | POA: Diagnosis not present

## 2023-10-05 DIAGNOSIS — I129 Hypertensive chronic kidney disease with stage 1 through stage 4 chronic kidney disease, or unspecified chronic kidney disease: Secondary | ICD-10-CM | POA: Diagnosis not present

## 2023-10-06 DIAGNOSIS — N401 Enlarged prostate with lower urinary tract symptoms: Secondary | ICD-10-CM | POA: Diagnosis not present

## 2023-10-06 DIAGNOSIS — F03911 Unspecified dementia, unspecified severity, with agitation: Secondary | ICD-10-CM | POA: Diagnosis not present

## 2023-10-06 DIAGNOSIS — I480 Paroxysmal atrial fibrillation: Secondary | ICD-10-CM | POA: Diagnosis not present

## 2023-10-06 DIAGNOSIS — N183 Chronic kidney disease, stage 3 unspecified: Secondary | ICD-10-CM | POA: Diagnosis not present

## 2023-10-06 DIAGNOSIS — I129 Hypertensive chronic kidney disease with stage 1 through stage 4 chronic kidney disease, or unspecified chronic kidney disease: Secondary | ICD-10-CM | POA: Diagnosis not present

## 2023-10-06 DIAGNOSIS — G311 Senile degeneration of brain, not elsewhere classified: Secondary | ICD-10-CM | POA: Diagnosis not present

## 2023-10-07 DIAGNOSIS — G311 Senile degeneration of brain, not elsewhere classified: Secondary | ICD-10-CM | POA: Diagnosis not present

## 2023-10-07 DIAGNOSIS — I480 Paroxysmal atrial fibrillation: Secondary | ICD-10-CM | POA: Diagnosis not present

## 2023-10-07 DIAGNOSIS — N401 Enlarged prostate with lower urinary tract symptoms: Secondary | ICD-10-CM | POA: Diagnosis not present

## 2023-10-07 DIAGNOSIS — I129 Hypertensive chronic kidney disease with stage 1 through stage 4 chronic kidney disease, or unspecified chronic kidney disease: Secondary | ICD-10-CM | POA: Diagnosis not present

## 2023-10-07 DIAGNOSIS — N183 Chronic kidney disease, stage 3 unspecified: Secondary | ICD-10-CM | POA: Diagnosis not present

## 2023-10-07 DIAGNOSIS — F03911 Unspecified dementia, unspecified severity, with agitation: Secondary | ICD-10-CM | POA: Diagnosis not present

## 2023-10-08 DIAGNOSIS — N183 Chronic kidney disease, stage 3 unspecified: Secondary | ICD-10-CM | POA: Diagnosis not present

## 2023-10-08 DIAGNOSIS — G311 Senile degeneration of brain, not elsewhere classified: Secondary | ICD-10-CM | POA: Diagnosis not present

## 2023-10-08 DIAGNOSIS — F03911 Unspecified dementia, unspecified severity, with agitation: Secondary | ICD-10-CM | POA: Diagnosis not present

## 2023-10-08 DIAGNOSIS — I480 Paroxysmal atrial fibrillation: Secondary | ICD-10-CM | POA: Diagnosis not present

## 2023-10-08 DIAGNOSIS — I129 Hypertensive chronic kidney disease with stage 1 through stage 4 chronic kidney disease, or unspecified chronic kidney disease: Secondary | ICD-10-CM | POA: Diagnosis not present

## 2023-10-08 DIAGNOSIS — N401 Enlarged prostate with lower urinary tract symptoms: Secondary | ICD-10-CM | POA: Diagnosis not present

## 2023-10-09 DIAGNOSIS — N401 Enlarged prostate with lower urinary tract symptoms: Secondary | ICD-10-CM | POA: Diagnosis not present

## 2023-10-09 DIAGNOSIS — G311 Senile degeneration of brain, not elsewhere classified: Secondary | ICD-10-CM | POA: Diagnosis not present

## 2023-10-09 DIAGNOSIS — I129 Hypertensive chronic kidney disease with stage 1 through stage 4 chronic kidney disease, or unspecified chronic kidney disease: Secondary | ICD-10-CM | POA: Diagnosis not present

## 2023-10-09 DIAGNOSIS — N183 Chronic kidney disease, stage 3 unspecified: Secondary | ICD-10-CM | POA: Diagnosis not present

## 2023-10-09 DIAGNOSIS — I480 Paroxysmal atrial fibrillation: Secondary | ICD-10-CM | POA: Diagnosis not present

## 2023-10-09 DIAGNOSIS — F03911 Unspecified dementia, unspecified severity, with agitation: Secondary | ICD-10-CM | POA: Diagnosis not present

## 2023-10-10 DIAGNOSIS — N401 Enlarged prostate with lower urinary tract symptoms: Secondary | ICD-10-CM | POA: Diagnosis not present

## 2023-10-10 DIAGNOSIS — G311 Senile degeneration of brain, not elsewhere classified: Secondary | ICD-10-CM | POA: Diagnosis not present

## 2023-10-10 DIAGNOSIS — N183 Chronic kidney disease, stage 3 unspecified: Secondary | ICD-10-CM | POA: Diagnosis not present

## 2023-10-10 DIAGNOSIS — F03911 Unspecified dementia, unspecified severity, with agitation: Secondary | ICD-10-CM | POA: Diagnosis not present

## 2023-10-10 DIAGNOSIS — I480 Paroxysmal atrial fibrillation: Secondary | ICD-10-CM | POA: Diagnosis not present

## 2023-10-10 DIAGNOSIS — I129 Hypertensive chronic kidney disease with stage 1 through stage 4 chronic kidney disease, or unspecified chronic kidney disease: Secondary | ICD-10-CM | POA: Diagnosis not present

## 2023-10-11 DIAGNOSIS — G311 Senile degeneration of brain, not elsewhere classified: Secondary | ICD-10-CM | POA: Diagnosis not present

## 2023-10-11 DIAGNOSIS — I480 Paroxysmal atrial fibrillation: Secondary | ICD-10-CM | POA: Diagnosis not present

## 2023-10-11 DIAGNOSIS — N401 Enlarged prostate with lower urinary tract symptoms: Secondary | ICD-10-CM | POA: Diagnosis not present

## 2023-10-11 DIAGNOSIS — I129 Hypertensive chronic kidney disease with stage 1 through stage 4 chronic kidney disease, or unspecified chronic kidney disease: Secondary | ICD-10-CM | POA: Diagnosis not present

## 2023-10-11 DIAGNOSIS — F03911 Unspecified dementia, unspecified severity, with agitation: Secondary | ICD-10-CM | POA: Diagnosis not present

## 2023-10-11 DIAGNOSIS — N183 Chronic kidney disease, stage 3 unspecified: Secondary | ICD-10-CM | POA: Diagnosis not present

## 2023-10-13 NOTE — Transitions of Care (Post Inpatient/ED Visit) (Signed)
 10/13/2023  Patient ID: Tanner Ho, male   DOB: 1940-07-31, 83 y.o.   MRN: [id number]  Chart Review for transitions of care.  See [employer] for documentation.  [name] RN, MSN [facility], [facility] RN Care Manager Direct Dial: [phone]  Fax: [phone] Website: [email or web address]

## 2023-11-02 DEATH — deceased

## 2023-11-03 ENCOUNTER — Ambulatory Visit: Payer: Medicare Other | Admitting: Cardiovascular Disease
